# Patient Record
Sex: Male | Born: 1965 | Race: Black or African American | Hispanic: No | Marital: Married | Smoking: Former smoker
Health system: Southern US, Community
[De-identification: ages and names within clinical notes are randomized; demographics above are authoritative.]

## PROBLEM LIST (undated history)

## (undated) DIAGNOSIS — G4730 Sleep apnea, unspecified: Secondary | ICD-10-CM

## (undated) DIAGNOSIS — I509 Heart failure, unspecified: Secondary | ICD-10-CM

## (undated) DIAGNOSIS — J45909 Unspecified asthma, uncomplicated: Secondary | ICD-10-CM

## (undated) DIAGNOSIS — I10 Essential (primary) hypertension: Secondary | ICD-10-CM

## (undated) HISTORY — DX: Essential (primary) hypertension: I10

## (undated) HISTORY — DX: Unspecified asthma, uncomplicated: J45.909

## (undated) HISTORY — DX: Sleep apnea, unspecified: G47.30

## (undated) HISTORY — PX: NO PAST SURGERIES: SHX2092

---

## 2013-02-10 ENCOUNTER — Encounter (HOSPITAL_COMMUNITY): Payer: Self-pay | Admitting: Emergency Medicine

## 2013-02-10 ENCOUNTER — Emergency Department (HOSPITAL_COMMUNITY)
Admission: EM | Admit: 2013-02-10 | Discharge: 2013-02-10 | Disposition: A | Discharge status: Home / Self Care | Payer: Self-pay | Attending: Emergency Medicine | Admitting: Emergency Medicine

## 2013-02-10 DIAGNOSIS — J3489 Other specified disorders of nose and nasal sinuses: Secondary | ICD-10-CM | POA: Insufficient documentation

## 2013-02-10 DIAGNOSIS — I10 Essential (primary) hypertension: Secondary | ICD-10-CM | POA: Insufficient documentation

## 2013-02-10 DIAGNOSIS — B9789 Other viral agents as the cause of diseases classified elsewhere: Secondary | ICD-10-CM

## 2013-02-10 DIAGNOSIS — J069 Acute upper respiratory infection, unspecified: Secondary | ICD-10-CM | POA: Insufficient documentation

## 2013-02-10 DIAGNOSIS — F17200 Nicotine dependence, unspecified, uncomplicated: Secondary | ICD-10-CM | POA: Insufficient documentation

## 2013-02-10 DIAGNOSIS — R062 Wheezing: Secondary | ICD-10-CM | POA: Insufficient documentation

## 2013-02-10 DIAGNOSIS — R509 Fever, unspecified: Secondary | ICD-10-CM | POA: Insufficient documentation

## 2013-02-10 DIAGNOSIS — J029 Acute pharyngitis, unspecified: Secondary | ICD-10-CM | POA: Insufficient documentation

## 2013-02-10 MED ORDER — ACETAMINOPHEN 325 MG PO TABS
650.0000 mg | ORAL_TABLET | Freq: Once | ORAL | Status: AC
  Administered 2013-02-10: 650 mg via ORAL
  Filled 2013-02-10: qty 2

## 2013-02-10 MED ORDER — PREDNISONE 20 MG PO TABS: 40.0000 mg | ORAL_TABLET | Freq: Every day | ORAL | Status: DC

## 2013-02-10 MED ORDER — ALBUTEROL SULFATE (2.5 MG/3ML) 0.083% IN NEBU
5.0000 mg | INHALATION_SOLUTION | Freq: Once | RESPIRATORY_TRACT | Status: AC
  Administered 2013-02-10: 5 mg via RESPIRATORY_TRACT
  Filled 2013-02-10: qty 6

## 2013-02-10 MED ORDER — IPRATROPIUM BROMIDE 0.02 % IN SOLN
0.5000 mg | Freq: Once | RESPIRATORY_TRACT | Status: AC
  Administered 2013-02-10: 0.5 mg via RESPIRATORY_TRACT
  Filled 2013-02-10: qty 2.5

## 2013-02-10 MED ORDER — ALBUTEROL SULFATE HFA 108 (90 BASE) MCG/ACT IN AERS: 2.0000 | INHALATION_SPRAY | Freq: Four times a day (QID) | RESPIRATORY_TRACT | Status: DC | PRN

## 2013-02-10 MED ORDER — PREDNISONE 20 MG PO TABS
60.0000 mg | ORAL_TABLET | Freq: Once | ORAL | Status: AC
  Administered 2013-02-10: 60 mg via ORAL
  Filled 2013-02-10: qty 3

## 2013-02-10 MED ORDER — HYDROCODONE-HOMATROPINE 5-1.5 MG/5ML PO SYRP: 5.0000 mL | ORAL_SOLUTION | Freq: Four times a day (QID) | ORAL | Status: DC | PRN

## 2013-02-10 NOTE — ED Notes (Signed)
Pt last saw doctor 4th of July 2014; pt stated BP was high but didn't remember what it was.

## 2013-02-10 NOTE — ED Notes (Signed)
Per pt sts that he has been having cold symptoms for a few days. sts  Congestion and not feeling well. . sts he actually feels better today than he has in a few days.

## 2013-02-10 NOTE — ED Provider Notes (Signed)
CSN: [id number]     Arrival date & time 02/10/13  1251 History  This chart was scribed for non-physician practitioner Abigail Butts, PA-C working with Richarda Blade, MD by Rolanda Lundborg, ED Scribe. This patient was seen in room TR11C/TR11C and the patient's care was started at 3:07 PM.    Chief Complaint  Patient presents with  . URI   The history is provided by the patient and medical records. No language interpreter was used.   HPI Comments: Keir Viernes is a 48 y.o. male with a h/o asthma who presents to the Emergency Department complaining of waxing and waning URI symptoms including congestion, rhinorrhea, headache, sore throat, fever, chills onset 4 days ago. Tmax 101 yesterday, broke with tylenol cold and ibuprofen. He reports productive cough with clear yellow sputum. He reports mild left ear pain that has mostly resolved. He states he started feeling better this morning but the symptoms have worsened again today. He states he has an albuterol inhaler but he does not use it. He denies myalgias, CP. He smokes 1/2 PPD.  History reviewed. No pertinent past medical history. History reviewed. No pertinent past surgical history. History reviewed. No pertinent family history. History  Substance Use Topics  . Smoking status: Current Every Day Smoker  . Smokeless tobacco: Not on file  . Alcohol Use: No    Review of Systems  Constitutional: Positive for fever and chills. Negative for diaphoresis, appetite change, fatigue and unexpected weight change.  HENT: Positive for congestion, ear pain, rhinorrhea and sore throat. Negative for mouth sores.   Eyes: Negative for visual disturbance.  Respiratory: Positive for cough. Negative for chest tightness, shortness of breath and wheezing.   Cardiovascular: Negative for chest pain.  Gastrointestinal: Negative for nausea, vomiting, abdominal pain, diarrhea and constipation.  Endocrine: Negative for polydipsia, polyphagia and polyuria.   Genitourinary: Negative for dysuria, urgency, frequency and hematuria.  Musculoskeletal: Negative for back pain, myalgias and neck stiffness.  Skin: Negative for rash.  Allergic/Immunologic: Negative for immunocompromised state.  Neurological: Negative for syncope, light-headedness and headaches.  Hematological: Does not bruise/bleed easily.  Psychiatric/Behavioral: Negative for sleep disturbance. The patient is not nervous/anxious.   All other systems reviewed and are negative.    Allergies  Review of patient's allergies indicates no known allergies.  Home Medications  No current outpatient prescriptions on file. BP 134/111  Pulse 95  Temp(Src) 98.7 F (37.1 C) (Oral)  Resp 24  Ht 5\' 6"  (1.676 m)  Wt 230 lb (104.327 kg)  BMI 37.14 kg/m2  SpO2 93% Physical Exam  Constitutional: He is oriented to person, place, and time. He appears well-developed and well-nourished. No distress.  HENT:  Head: Normocephalic and atraumatic.  Right Ear: Tympanic membrane, external ear and ear canal normal. Tympanic membrane is not erythematous and not bulging.  Left Ear: Tympanic membrane, external ear and ear canal normal. Tympanic membrane is not erythematous and not bulging.  Nose: Mucosal edema and rhinorrhea present. No epistaxis. Right sinus exhibits no maxillary sinus tenderness and no frontal sinus tenderness. Left sinus exhibits no maxillary sinus tenderness and no frontal sinus tenderness.  Mouth/Throat: Uvula is midline and mucous membranes are normal. Mucous membranes are not pale and not cyanotic. Posterior oropharyngeal erythema (mild) present. No oropharyngeal exudate, posterior oropharyngeal edema or tonsillar abscesses.  Eyes: Conjunctivae are normal. Pupils are equal, round, and reactive to light.  Neck: Normal range of motion and full passive range of motion without pain.  Cardiovascular: Normal rate, normal heart sounds  and intact distal pulses.   No murmur  heard. Pulmonary/Chest: Effort normal. No accessory muscle usage or stridor. Not tachypneic. No respiratory distress. He has decreased breath sounds (lower lobes). He has wheezes (expiratory throughout). He has no rhonchi. He has no rales.  Diminished lung sounds in the bases with expiratory wheezes throughout  Abdominal: Soft. Bowel sounds are normal. He exhibits no distension. There is no tenderness.  Musculoskeletal: Normal range of motion.  Lymphadenopathy:    He has no cervical adenopathy.  Neurological: He is alert and oriented to person, place, and time. He exhibits normal muscle tone. Coordination normal.  Skin: Skin is warm and dry. No rash noted. He is not diaphoretic. No erythema.  Psychiatric: He has a normal mood and affect.    ED Course  Procedures (including critical care time) Medications - No data to display   DIAGNOSTIC STUDIES: Oxygen Saturation is 93% on RA, normal by my interpretation.    COORDINATION OF CARE: 3:34 PM- Discussed treatment plan with pt which includes breathing treatment and steroids. Pt agrees to plan.    Labs Review Labs Reviewed - No data to display Imaging Review No results found.  EKG Interpretation   None       MDM  No diagnosis found.   [name] presents with URI ssx x 4 days.  Patient with no focal lung abnormalities, only Gen. wheezing and history of asthma. No x-ray indicated at this time.  Patients symptoms are consistent with URI, likely viral etiology. Discussed that antibiotics are not indicated for viral infections. Patient with wheezing throughout and has not attempted albuterol treatments at home. We'll give albuterol treatment here and reassess.  5:45PM Patient reports he is breathing much better but still feels poorly.  He is febrile 100.8. Will give Tylenol and reassess.  6:32 PM Patient noted to be hypertensive in the emergency department, and remains that way at this time..  No signs of hypertensive  urgency.  Discussed with patient the need for close follow-up and management by their primary care physician.  He reports hypertension at his last in the evaluation in July of 2014, but denies having a diagnosis of hypertension. Patient has been taking cold medicine from a car for several days exacerbating his hypertension.  He denies chest pain, shortness of breath, headache or any other symptoms decreased concern for endorgan damage   It has been determined that no acute conditions requiring further emergency intervention are present at this time. The patient/guardian have been advised of the diagnosis and plan. We have discussed signs and symptoms that warrant return to the ED, such as changes or worsening in symptoms.   Vital signs are stable at discharge.   BP 180/104  Pulse 98  Temp(Src) 100.8 F (38.2 C) (Oral)  Resp 20  Ht 5\' 6"  (1.676 m)  Wt 230 lb (104.327 kg)  BMI 37.14 kg/m2  SpO2 98%  Patient/guardian has voiced understanding and agreed to follow-up with the PCP or specialist.    I personally performed the services described in this documentation, which was scribed in my presence. The recorded information has been reviewed and is accurate.  1. Medications: Albuterol and prednisone for asthma exacerbation, Hycodan for cough, usual home medications 2. Treatment: rest, drink plenty of fluids, take Coricidin cold medicine instead of the current cold medicine. 3. Follow Up: Please use the resource guide provided to find a PCP;   Jarrett Soho Deiontae Rabel, PA-C 02/10/13 1842

## 2013-02-10 NOTE — Discharge Instructions (Signed)
1. Medications: Albuterol and prednisone for asthma exacerbation, Hycodan for cough, usual home medications 2. Treatment: rest, drink plenty of fluids, take Coricidin cold medicine instead of the current cold medicine. 3. Follow Up: Please followup with your primary doctor for discussion of your diagnoses and further evaluation after today's visit; if you do not have a primary care doctor use the resource guide provided to find one;   Upper Respiratory Infection, Adult An upper respiratory infection (URI) is also known as the common cold. It is often caused by a type of germ (virus). Colds are easily spread (contagious). You can pass it to others by kissing, coughing, sneezing, or drinking out of the same glass. Usually, you get better in 1 or 2 weeks.  HOME CARE   Only take medicine as told by your doctor.  Use a warm mist humidifier or breathe in steam from a hot shower.  Drink enough water and fluids to keep your pee (urine) clear or pale yellow.  Get plenty of rest.  Return to work when your temperature is back to normal or as told by your doctor. You may use a face mask and wash your hands to stop your cold from spreading. GET HELP RIGHT AWAY IF:   After the first few days, you feel you are getting worse.  You have questions about your medicine.  You have chills, shortness of breath, or brown or red spit (mucus).  You have yellow or brown snot (nasal discharge) or pain in the face, especially when you bend forward.  You have a fever, puffy (swollen) neck, pain when you swallow, or white spots in the back of your throat.  You have a bad headache, ear pain, sinus pain, or chest pain.  You have a high-pitched whistling sound when you breathe in and out (wheezing).  You have a lasting cough or cough up blood.  You have sore muscles or a stiff neck. MAKE SURE YOU:   Understand these instructions.  Will watch your condition.  Will get help right away if you are not doing well  or get worse. Document Released: 07/03/2007 Document Revised: 04/08/2011 Document Reviewed: 05/21/2010 [facility] Patient Information 2014 [place], [place].   Hypertension As your heart beats, it forces blood through your arteries. This force is your blood pressure. If the pressure is too high, it is called hypertension (HTN) or high blood pressure. HTN is dangerous because you may have it and not know it. High blood pressure may mean that your heart has to work harder to pump blood. Your arteries may be narrow or stiff. The extra work puts you at risk for heart disease, stroke, and other problems.  Blood pressure consists of two numbers, a higher number over a lower, 110/72, for example. It is stated as "110 over 72." The ideal is below 120 for the top number (systolic) and under 80 for the bottom (diastolic). Write down your blood pressure today. You should pay close attention to your blood pressure if you have certain conditions such as:  Heart failure.  Prior heart attack.  Diabetes  Chronic kidney disease.  Prior stroke.  Multiple risk factors for heart disease. To see if you have HTN, your blood pressure should be measured while you are seated with your arm held at the level of the heart. It should be measured at least twice. A one-time elevated blood pressure reading (especially in the Emergency Department) does not mean that you need treatment. There may be conditions in which the  blood pressure is different between your right and left arms. It is important to see your caregiver soon for a recheck. Most people have essential hypertension which means that there is not a specific cause. This type of high blood pressure may be lowered by changing lifestyle factors such as:  Stress.  Smoking.  Lack of exercise.  Excessive weight.  Drug/tobacco/alcohol use.  Eating less salt. Most people do not have symptoms from high blood pressure until it has caused damage to the body. Effective  treatment can often prevent, delay or reduce that damage. TREATMENT  When a cause has been identified, treatment for high blood pressure is directed at the cause. There are a large number of medications to treat HTN. These fall into several categories, and your caregiver will help you select the medicines that are best for you. Medications may have side effects. You should review side effects with your caregiver. If your blood pressure stays high after you have made lifestyle changes or started on medicines,   Your medication(s) may need to be changed.  Other problems may need to be addressed.  Be certain you understand your prescriptions, and know how and when to take your medicine.  Be sure to follow up with your caregiver within the time frame advised (usually within two weeks) to have your blood pressure rechecked and to review your medications.  If you are taking more than one medicine to lower your blood pressure, make sure you know how and at what times they should be taken. Taking two medicines at the same time can result in blood pressure that is too low. SEEK IMMEDIATE MEDICAL CARE IF:  You develop a severe headache, blurred or changing vision, or confusion.  You have unusual weakness or numbness, or a faint feeling.  You have severe chest or abdominal pain, vomiting, or breathing problems. MAKE SURE YOU:   Understand these instructions.  Will watch your condition.  Will get help right away if you are not doing well or get worse. Document Released: 01/14/2005 Document Revised: 04/08/2011 Document Reviewed: 09/04/2007 [facility] Patient Information 2014 [place].

## 2013-02-11 NOTE — ED Provider Notes (Signed)
Medical screening examination/treatment/procedure(s) were performed by non-physician practitioner and as supervising physician I was immediately available for consultation/collaboration.  Richarda Blade, MD 02/11/13 [phone]

## 2015-08-14 ENCOUNTER — Encounter (HOSPITAL_COMMUNITY): Payer: Self-pay

## 2015-08-14 ENCOUNTER — Emergency Department (HOSPITAL_COMMUNITY): Payer: 59

## 2015-08-14 ENCOUNTER — Emergency Department (HOSPITAL_COMMUNITY)
Admission: EM | Admit: 2015-08-14 | Discharge: 2015-08-14 | Disposition: A | Discharge status: Home / Self Care | Payer: 59 | Attending: Emergency Medicine | Admitting: Emergency Medicine

## 2015-08-14 DIAGNOSIS — F17200 Nicotine dependence, unspecified, uncomplicated: Secondary | ICD-10-CM | POA: Insufficient documentation

## 2015-08-14 DIAGNOSIS — D1720 Benign lipomatous neoplasm of skin and subcutaneous tissue of unspecified limb: Secondary | ICD-10-CM

## 2015-08-14 DIAGNOSIS — Z791 Long term (current) use of non-steroidal anti-inflammatories (NSAID): Secondary | ICD-10-CM | POA: Diagnosis not present

## 2015-08-14 DIAGNOSIS — Z79899 Other long term (current) drug therapy: Secondary | ICD-10-CM | POA: Insufficient documentation

## 2015-08-14 DIAGNOSIS — I10 Essential (primary) hypertension: Secondary | ICD-10-CM | POA: Diagnosis not present

## 2015-08-14 DIAGNOSIS — D1722 Benign lipomatous neoplasm of skin and subcutaneous tissue of left arm: Secondary | ICD-10-CM | POA: Diagnosis not present

## 2015-08-14 DIAGNOSIS — M799 Soft tissue disorder, unspecified: Secondary | ICD-10-CM | POA: Diagnosis present

## 2015-08-14 LAB — CBC
HEMATOCRIT: 41.3 % (ref 39.0–52.0)
HEMOGLOBIN: 14.4 g/dL (ref 13.0–17.0)
MCH: 29.6 pg (ref 26.0–34.0)
MCHC: 34.9 g/dL (ref 30.0–36.0)
MCV: 85 fL (ref 78.0–100.0)
Platelets: 208 10*3/uL (ref 150–400)
RBC: 4.86 MIL/uL (ref 4.22–5.81)
RDW: 12.9 % (ref 11.5–15.5)
WBC: 5.6 10*3/uL (ref 4.0–10.5)

## 2015-08-14 LAB — BASIC METABOLIC PANEL
ANION GAP: 6 (ref 5–15)
BUN: 14 mg/dL (ref 6–20)
CO2: 29 mmol/L (ref 22–32)
Calcium: 8.7 mg/dL — ABNORMAL LOW (ref 8.9–10.3)
Chloride: 104 mmol/L (ref 101–111)
Creatinine, Ser: 0.95 mg/dL (ref 0.61–1.24)
GFR calc Af Amer: >60 mL/min (ref >60)
GFR calc non Af Amer: >60 mL/min (ref >60)
GLUCOSE: 135 mg/dL — AB (ref 65–99)
POTASSIUM: 3.5 mmol/L (ref 3.5–5.1)
Sodium: 139 mmol/L (ref 135–145)

## 2015-08-14 LAB — I-STAT TROPONIN, ED: Troponin i, poc: 0.01 ng/mL (ref 0.00–0.08)

## 2015-08-14 IMAGING — CR DG CHEST 2V
2 series · 2 of 2 positions shown · non-contrast
Comparison: None.

CLINICAL DATA: Right side chest pain starting yesterday

EXAM:
CHEST  2 VIEW

[w chest pa]
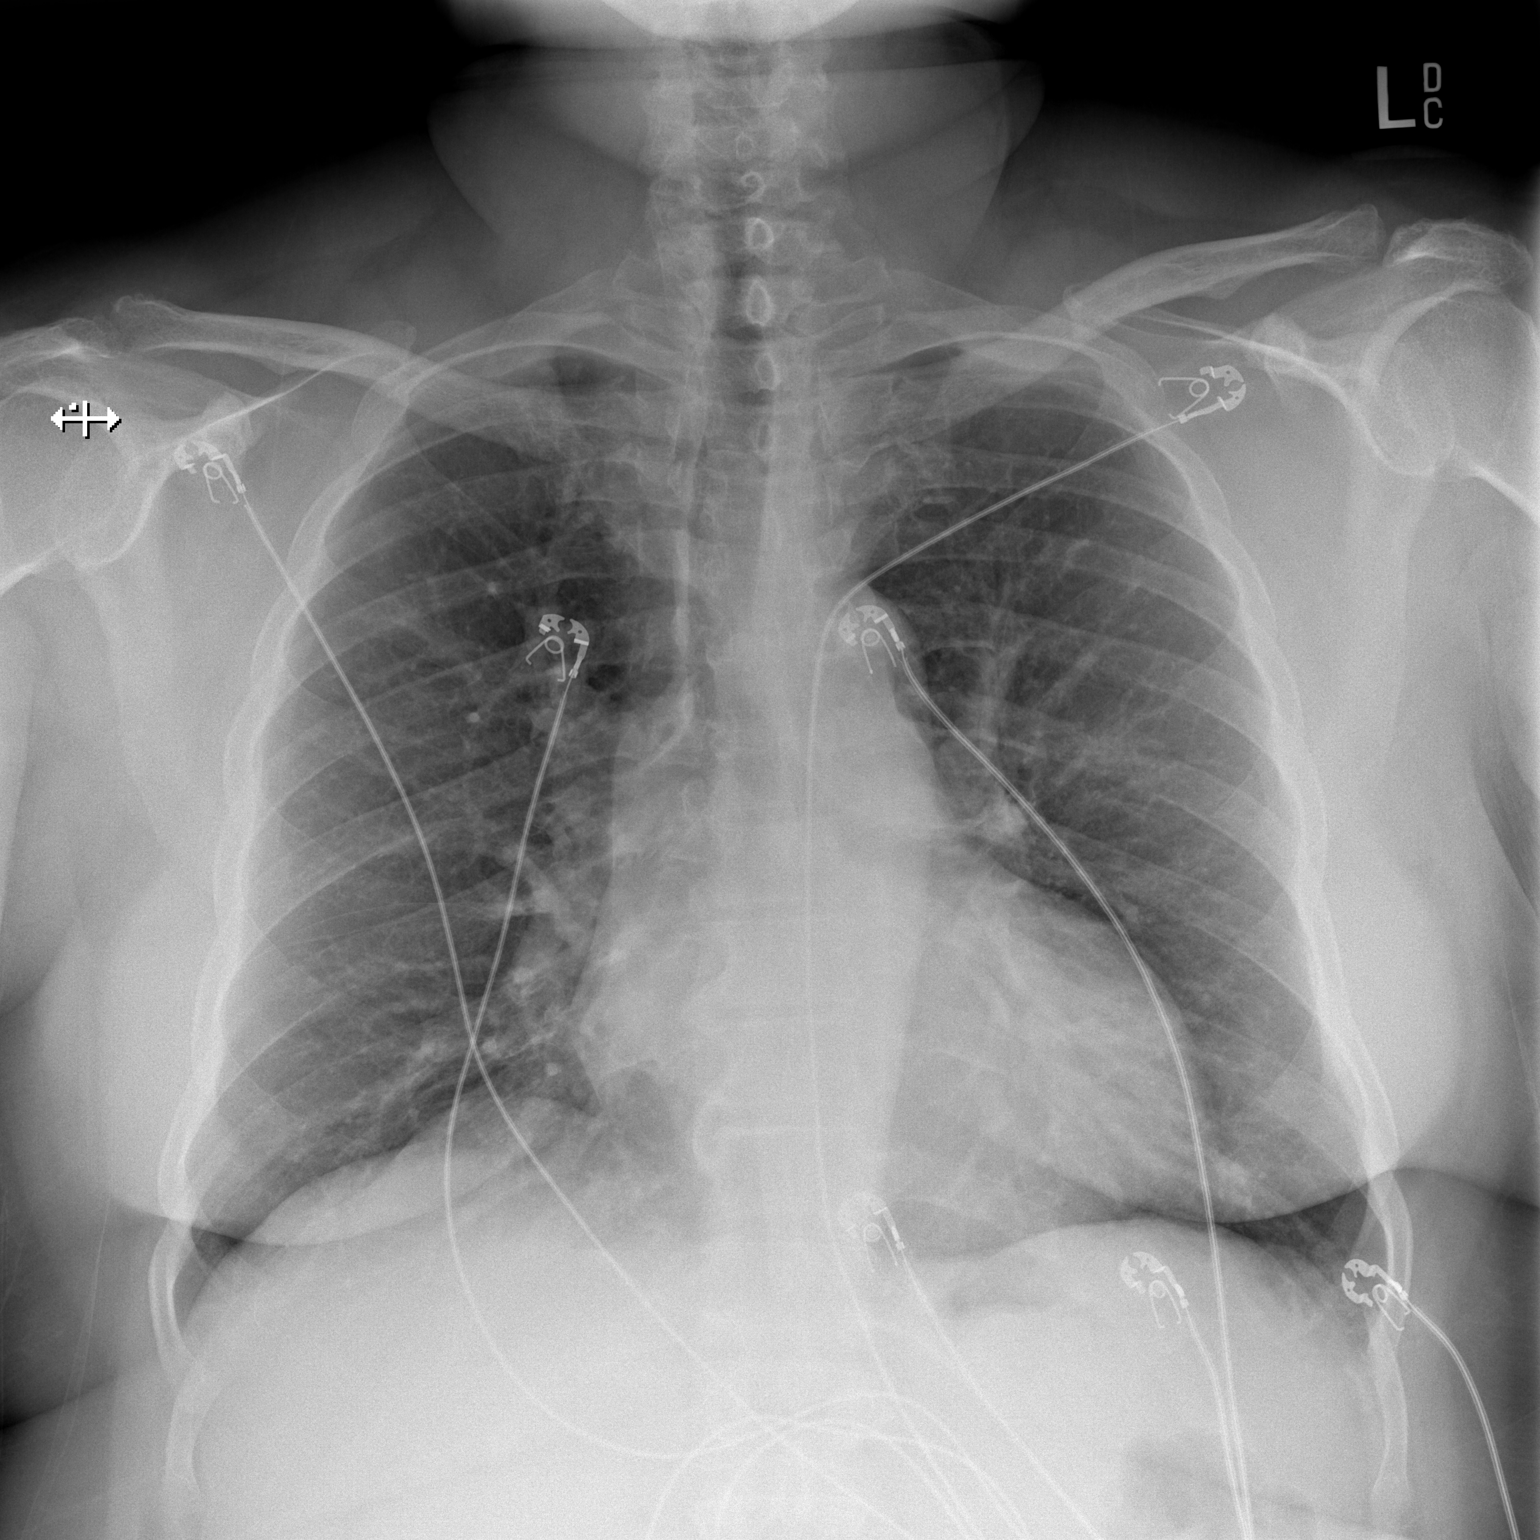

[w chest lat]
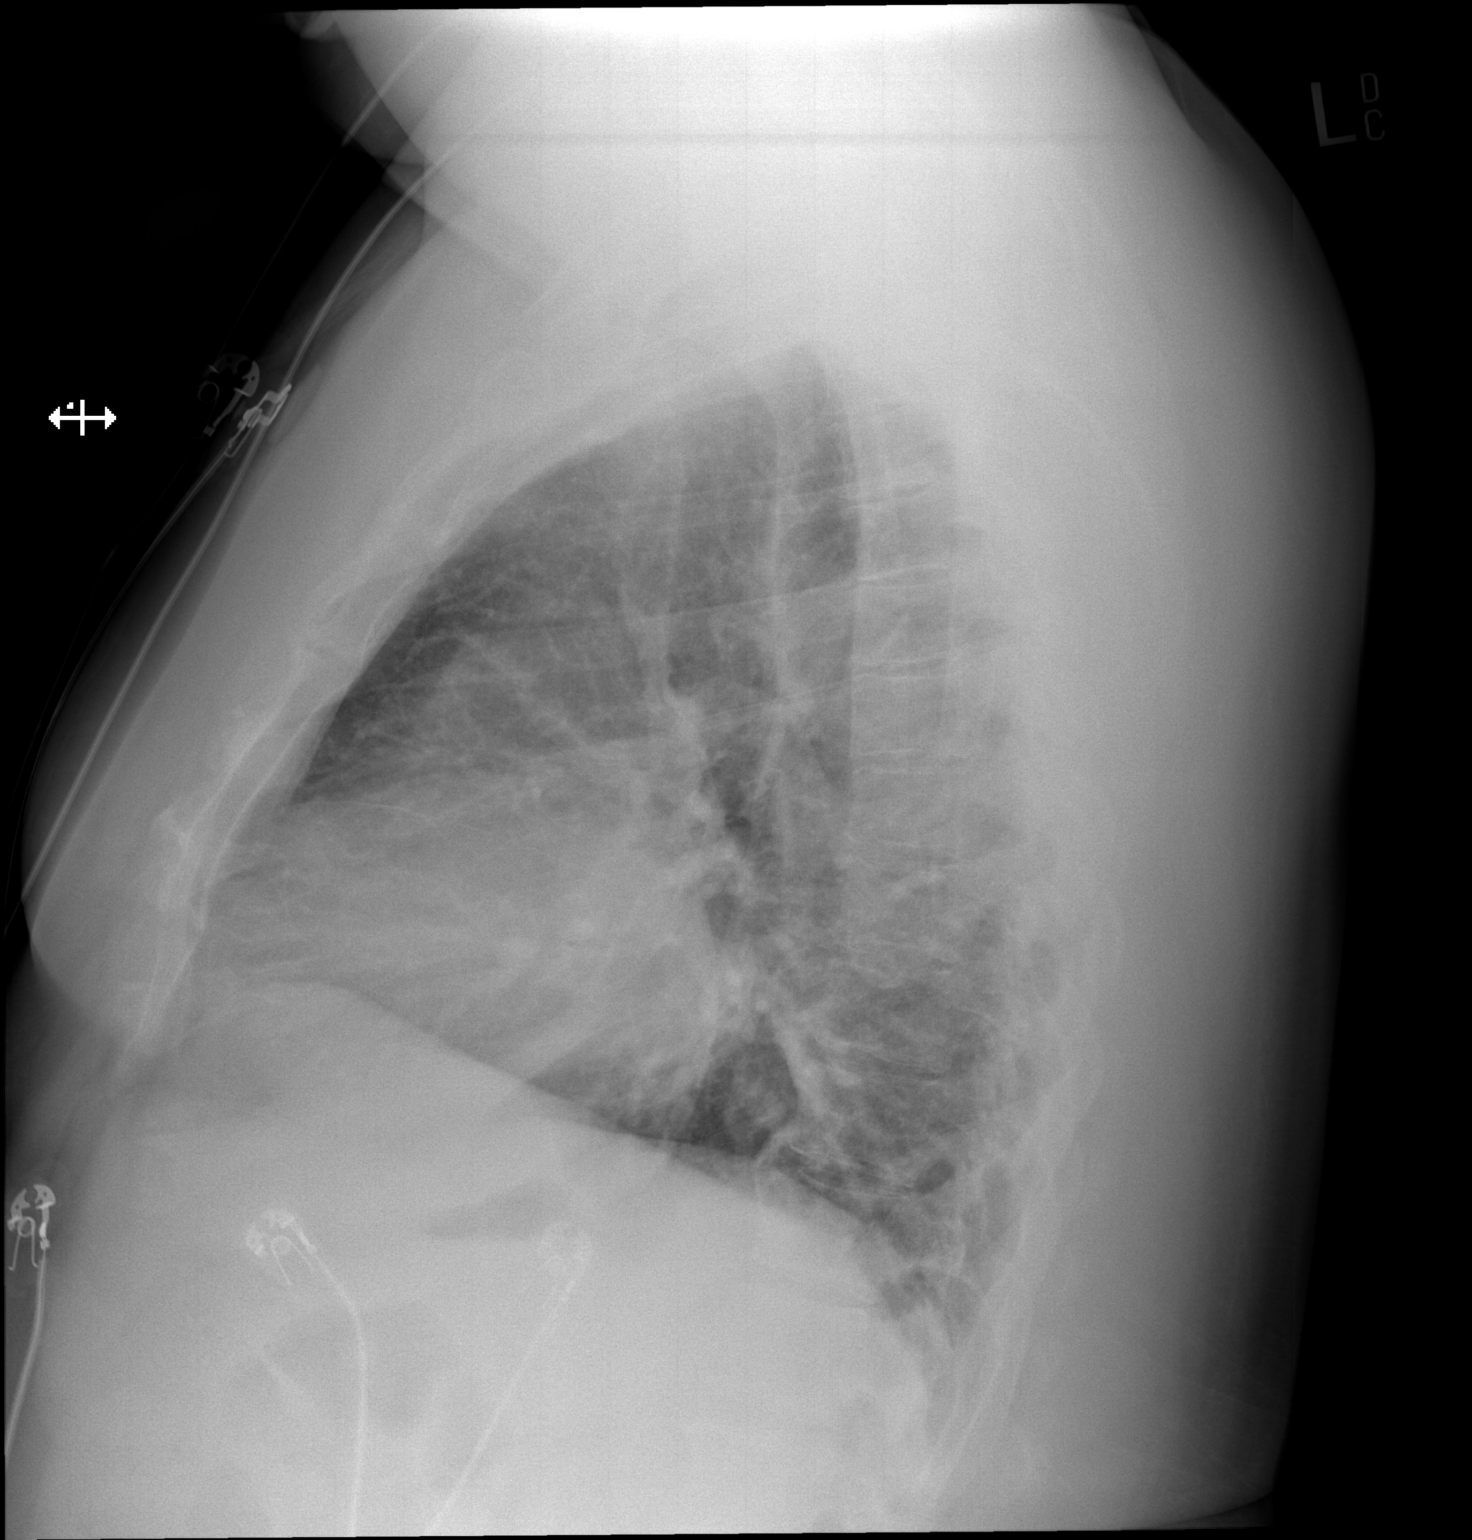

[2 of 2 positions shown; findings below may reference images not displayed]

FINDINGS: Cardiomediastinal silhouette is unremarkable. No acute infiltrate or
pleural effusion. No pulmonary edema. Mild degenerative changes
lower thoracic spine. Mild perihilar bronchitic changes.
IMPRESSION: No infiltrate or pulmonary edema. Mild perihilar bronchitic changes.

## 2015-08-14 NOTE — ED Provider Notes (Addendum)
CSN: [id number]     Arrival date & time 08/14/15  1358 History   First MD Initiated Contact with Patient 08/14/15 1428     Chief Complaint  Patient presents with  . Arm Swelling  . Chest Pain     (Consider location/radiation/quality/duration/timing/severity/associated sxs/prior Treatment) HPI Comments: Patient is a 50 year old male with no known medical problems however he does not go to the doctor and was last seen by a physician approximate 5 years ago. Today patient has 2 separate complaints. Initial complaint of swelling on his left arm that's worsened over the last 2 months. It is nonpainful but is now starting to cause some tingling and numbness in his left thumb. 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 He denies having any trauma to this area of his arm and states the swelling just seems to be getting bigger. It has never drained or become red. Secondly patient states for months he's had intermittent episodes of sharp chest pain the last 1-2 seconds that are in the center of his chest. They usually occur at rest and are not associated with coughing or deep breathing. He has also noticed intermittent lower extremity swelling and shortness of breath that comes and goes and usually last no more than 1-2 days. He currently is not having any of these symptoms and has not had them for days. He does not watch his diet he does smoke cigarettes only but does not drink much alcohol. He has noticed when he eats a salty meal he seems to have more swelling and more shortness of breath.  The history is provided by the patient.    History reviewed. No pertinent past medical history. History reviewed. No pertinent past surgical history. History reviewed. No pertinent family history. Social History  Substance Use Topics  . Smoking status: Current Every Day Smoker  . Smokeless tobacco: None  . Alcohol Use: No    Review of Systems  All other systems reviewed and are  negative.     Allergies  Review of patient's allergies indicates no known allergies.  Home Medications   Prior to Admission medications   Medication Sig Start Date End Date Taking? Authorizing Provider  albuterol (PROVENTIL HFA;VENTOLIN HFA) 108 (90 BASE) MCG/ACT inhaler Inhale 2 puffs into the lungs every 6 (six) hours as needed for wheezing or shortness of breath. 02/10/13   Hannah Muthersbaugh, PA-C  HYDROcodone-homatropine (HYCODAN) 5-1.5 MG/5ML syrup Take 5 mLs by mouth every 6 (six) hours as needed for cough. 02/10/13   Hannah Muthersbaugh, PA-C  ibuprofen (ADVIL,MOTRIN) 200 MG tablet Take 400 mg by mouth every 6 (six) hours as needed.    Historical Provider, MD  predniSONE (DELTASONE) 20 MG tablet Take 2 tablets (40 mg total) by mouth daily. 02/10/13   Hannah Muthersbaugh, PA-C  Pseudoeph-CPM-DM-APAP (TYLENOL COLD PO) Take 2 capsules by mouth every 6 (six) hours as needed. Cold.    Historical Provider, MD   BP 160/89 mmHg  Pulse 85  Temp(Src) 99.5 F (37.5 C) (Oral)  Resp 18  Ht 5' 6.5" (1.689 m)  Wt 273 lb (123.832 kg)  BMI 43.41 kg/m2  SpO2 94% Physical Exam  Constitutional: He is oriented to person, place, and time. He appears well-developed and well-nourished. No distress.  HENT:  Head: Normocephalic and atraumatic.  Mouth/Throat: Oropharynx is clear and moist.  Eyes: Conjunctivae and EOM are normal. Pupils are equal, round, and reactive to light.  Neck: Normal range of motion. Neck  supple.  Cardiovascular: Normal rate, regular rhythm and intact distal pulses.   No murmur heard. Pulmonary/Chest: Effort normal and breath sounds normal. No respiratory distress. He has no wheezes. He has no rales.  Abdominal: Soft. He exhibits no distension. There is no tenderness. There is no rebound and no guarding.  Musculoskeletal: Normal range of motion. He exhibits no edema or tenderness.       Arms: Neurological: He is alert and oriented to person, place, and time.  Skin: Skin is  warm and dry. No rash noted. No erythema.  Psychiatric: He has a normal mood and affect. His behavior is normal.  Nursing note and vitals reviewed.   ED Course  Procedures (including critical care time) Labs Review Labs Reviewed  BASIC METABOLIC PANEL - Abnormal; Notable for the following:    Glucose, Bld 135 (*)    Calcium 8.7 (*)    All other components within normal limits  CBC  I-STAT TROPOININ, ED    Imaging Review Dg Chest 2 View  08/14/2015  CLINICAL DATA:  Right side chest pain starting yesterday EXAM: CHEST  2 VIEW COMPARISON:  None. FINDINGS: Cardiomediastinal silhouette is unremarkable. No acute infiltrate or pleural effusion. No pulmonary edema. Mild degenerative changes lower thoracic spine. Mild perihilar bronchitic changes. IMPRESSION: No infiltrate or pulmonary edema. Mild perihilar bronchitic changes. Electronically Signed   By: Lahoma Crocker M.D.   On: 08/14/2015 14:33   I have personally reviewed and evaluated these images and lab results as part of my medical decision-making.   EKG Interpretation   Date/Time:  Monday August 14 2015 14:16:38 EDT Ventricular Rate:  79 PR Interval:    QRS Duration: 84 QT Interval:  386 QTC Calculation: 443 R Axis:   24 Text Interpretation:  Sinus rhythm Normal ECG No previous tracing  Confirmed by Maryan Rued  MD, Loree Fee ([id number]) on 08/14/2015 2:29:16 PM      MDM   Final diagnoses:  Lipoma of forearm  Essential hypertension    Patient is a 50 year old male presenting today with 2 complaints. Initially the biggest problem is he does not have a primary care physician. His initial reason for patient was swelling of his left arm that's worsening and now causing some numbness to his left thumb. Patient has a lipoma present on his left arm without any signs of infection. Soft but approximately baseball size. Bedside ultrasounds with no sign of cystic fluid and patient denies any trauma. Patient will need follow-up with hand surgery for  further management. Secondly patient states for months he's had intermittent sharp chest pain that last 1-2 seconds, intermittent leg swelling and occasional shortness of breath. His leg Swelling and shortness of breath seemed to be worse if he eats a salty meal. He does not check his blood pressure regularly in the last time he saw Dr. was approximately 5 years ago. He currently denies having any chest pain or shortness of breath in the last few days. She denies having any swelling right now. However since he was here he thought he should mention it.  Patient was hypertensive here but is in no acute distress and is very pleasant gentleman. He has no significant exam findings and has no obvious lower extremity edema at this time. Discussed with him the importance of getting a primary care physician as well as decreasing the salt in his diet and exercise. CBC, BMP pending to ensure normal renal function.  4:15 PM Labs within normal limits. Findings discussed with the patient and encouraged  him to follow-up with the PCP. He does have medical insurance. Also given the DASH diet and told to exercise. Also told to check his blood pressure 2 times a week and keep a record.  Blanchie Dessert, MD 08/14/15 1615  Blanchie Dessert, MD 08/14/15 [phone]

## 2015-08-14 NOTE — Progress Notes (Signed)
[facility] consulted for pcp resources.  EDCM spoke to patient at bedside.  EDCM provided patient with list of [employer] providers within a 20 mile radius of patient's zip code to assist in finding a pcp.  Patient very thankful for services. No further EDCM needs at this time.

## 2015-08-14 NOTE — ED Notes (Signed)
EDP at bedside  

## 2015-08-14 NOTE — ED Notes (Signed)
Pt with multiple complaints.  Pt does not have primary care to see patient.  After initial assessment for arm swelling which was initial complaint, pt states he wants all assessment b/c he does not have a primary md and needs blood work.  His arm swelling.  Has off/on chest pain with headache.  Denies chest pain today.  Legs with swelling.  Eyes have been red. Wife at bedside prompting his evaluation for the symptoms.  Pt initial complaint was to be seen for arm swelling x 2 months to left forearm.  Denies injury

## 2015-08-14 NOTE — Discharge Instructions (Signed)
DASH Eating Plan °DASH stands for "Dietary Approaches to Stop Hypertension." The DASH eating plan is a healthy eating plan that has been shown to reduce high blood pressure (hypertension). Additional health benefits may include reducing the risk of type 2 diabetes mellitus, heart disease, and stroke. The DASH eating plan may also help with weight loss. °WHAT DO I NEED TO KNOW ABOUT THE DASH EATING PLAN? °For the DASH eating plan, you will follow these general guidelines: °· Choose foods with a percent daily value for sodium of less than 5% (as listed on the food label). °· Use salt-free seasonings or herbs instead of table salt or sea salt. °· Check with your health care provider or pharmacist before using salt substitutes. °· Eat lower-sodium products, often labeled as "lower sodium" or "no salt added." °· Eat fresh foods. °· Eat more vegetables, fruits, and low-fat dairy products. °· Choose whole grains. Look for the word "whole" as the first word in the ingredient list. °· Choose fish and skinless chicken or turkey more often than red meat. Limit fish, poultry, and meat to 6 oz (170 g) each day. °· Limit sweets, desserts, sugars, and sugary drinks. °· Choose heart-healthy fats. °· Limit cheese to 1 oz (28 g) per day. °· Eat more home-cooked food and less restaurant, buffet, and fast food. °· Limit fried foods. °· Cook foods using methods other than frying. °· Limit canned vegetables. If you do use them, rinse them well to decrease the sodium. °· When eating at a restaurant, ask that your food be prepared with less salt, or no salt if possible. °WHAT FOODS CAN I EAT? °Seek help from a dietitian for individual calorie needs. °Grains °Whole grain or whole wheat bread. Brown rice. Whole grain or whole wheat pasta. Quinoa, bulgur, and whole grain cereals. Low-sodium cereals. Corn or whole wheat flour tortillas. Whole grain cornbread. Whole grain crackers. Low-sodium crackers. °Vegetables °Fresh or frozen vegetables  (raw, steamed, roasted, or grilled). Low-sodium or reduced-sodium tomato and vegetable juices. Low-sodium or reduced-sodium tomato sauce and paste. Low-sodium or reduced-sodium canned vegetables.  °Fruits °All fresh, canned (in natural juice), or frozen fruits. °Meat and Other Protein Products °Ground beef (85% or leaner), grass-fed beef, or beef trimmed of fat. Skinless chicken or turkey. Ground chicken or turkey. Pork trimmed of fat. All fish and seafood. Eggs. Dried beans, peas, or lentils. Unsalted nuts and seeds. Unsalted canned beans. °Dairy °Low-fat dairy products, such as skim or 1% milk, 2% or reduced-fat cheeses, low-fat ricotta or cottage cheese, or plain low-fat yogurt. Low-sodium or reduced-sodium cheeses. °Fats and Oils °Tub margarines without trans fats. Light or reduced-fat mayonnaise and salad dressings (reduced sodium). Avocado. Safflower, olive, or canola oils. Natural peanut or almond butter. °Other °Unsalted popcorn and pretzels. °The items listed above may not be a complete list of recommended foods or beverages. Contact your dietitian for more options. °WHAT FOODS ARE NOT RECOMMENDED? °Grains °White bread. White pasta. White rice. Refined cornbread. Bagels and croissants. Crackers that contain trans fat. °Vegetables °Creamed or fried vegetables. Vegetables in a cheese sauce. Regular canned vegetables. Regular canned tomato sauce and paste. Regular tomato and vegetable juices. °Fruits °Dried fruits. Canned fruit in light or heavy syrup. Fruit juice. °Meat and Other Protein Products °Fatty cuts of meat. Ribs, chicken wings, bacon, sausage, bologna, salami, chitterlings, fatback, hot dogs, bratwurst, and packaged luncheon meats. Salted nuts and seeds. Canned beans with salt. °Dairy °Whole or 2% milk, cream, half-and-half, and cream cheese. Whole-fat or sweetened yogurt. Full-fat   cheeses or blue cheese. Nondairy creamers and whipped toppings. Processed cheese, cheese spreads, or cheese  curds. Condiments Onion and garlic salt, seasoned salt, table salt, and sea salt. Canned and packaged gravies. Worcestershire sauce. Tartar sauce. Barbecue sauce. Teriyaki sauce. Soy sauce, including reduced sodium. Steak sauce. Fish sauce. Oyster sauce. Cocktail sauce. Horseradish. Ketchup and mustard. Meat flavorings and tenderizers. Bouillon cubes. Hot sauce. Tabasco sauce. Marinades. Taco seasonings. Relishes. Fats and Oils Butter, stick margarine, lard, shortening, ghee, and bacon fat. Coconut, palm kernel, or palm oils. Regular salad dressings. Other Pickles and olives. Salted popcorn and pretzels. The items listed above may not be a complete list of foods and beverages to avoid. Contact your dietitian for more information. WHERE CAN I FIND MORE INFORMATION? National Heart, Lung, and Blood Institute: travelstabloid.com   This information is not intended to replace advice given to you by your health care provider. Make sure you discuss any questions you have with your health care provider.   Document Released: 01/03/2011 Document Revised: 02/04/2014 Document Reviewed: 11/18/2012 Elsevier Interactive Patient Education 2016 [employer].  How to Take Your Blood Pressure HOW DO I GET A BLOOD PRESSURE MACHINE?  You can buy an electronic home blood pressure machine at your local pharmacy. Insurance will sometimes cover the cost if you have a prescription.  Ask your doctor what type of machine is best for you. There are different machines for your arm and your wrist.  If you decide to buy a machine to check your blood pressure on your arm, first check the size of your arm so you can buy the right size cuff. To check the size of your arm:   Use a measuring tape that shows both inches and centimeters.   Wrap the measuring tape around the upper-middle part of your arm. You may need someone to help you measure.   Write down your arm measurement in both  inches and centimeters.   To measure your blood pressure correctly, it is important to have the right size cuff.   If your arm is up to 13 inches (up to 34 centimeters), get an adult cuff size.  If your arm is 13 to 17 inches (35 to 44 centimeters), get a large adult cuff size.    If your arm is 17 to 20 inches (45 to 52 centimeters), get an adult thigh cuff.  WHAT DO THE NUMBERS MEAN?   There are two numbers that make up your blood pressure. For example: 120/80.  The first number (120 in our example) is called the "systolic pressure." It is a measure of the pressure in your blood vessels when your heart is pumping blood.  The second number (80 in our example) is called the "diastolic pressure." It is a measure of the pressure in your blood vessels when your heart is resting between beats.  Your doctor will tell you what your blood pressure should be. WHAT SHOULD I DO BEFORE I CHECK MY BLOOD PRESSURE?   Try to rest or relax for at least 30 minutes before you check your blood pressure.  Do not smoke.  Do not have any drinks with caffeine, such as:  Soda.  Coffee.  Tea.  Check your blood pressure in a quiet room.  Sit down and stretch out your arm on a table. Keep your arm at about the level of your heart. Let your arm relax.  Make sure that your legs are not crossed. HOW DO I CHECK MY BLOOD PRESSURE?  Follow  the directions that came with your machine.  Make sure you remove any tight-fitting clothing from your arm or wrist. Wrap the cuff around your upper arm or wrist. You should be able to fit a finger between the cuff and your arm. If you cannot fit a finger between the cuff and your arm, it is too tight and should be removed and rewrapped.  Some units require you to manually pump up the arm cuff.  Automatic units inflate the cuff when you press a button.  Cuff deflation is automatic in both models.  After the cuff is inflated, the unit measures your blood  pressure and pulse. The readings are shown on a monitor. Hold still and breathe normally while the cuff is inflated.  Getting a reading takes less than a minute.  Some models store readings in a memory. Some provide a printout of readings. If your machine does not store your readings, keep a written record.  Take readings with you to your next visit with your doctor.   This information is not intended to replace advice given to you by your health care provider. Make sure you discuss any questions you have with your health care provider.   Document Released: 12/28/2007 Document Revised: 02/04/2014 Document Reviewed: 03/11/2013 Elsevier Interactive Patient Education 2016 Else[employer] Inc.  Heart Disease Prevention Heart disease is a leading cause of death. There are many things you can do to help prevent heart disease. BE PHYSICALLY ACTIVE Physical activity is good for your heart. It helps control your blood pressure, cholesterol levels, and weight. Try to be physically active every day. Ask your health care provider what activities are best for you.  BE A HEALTHY WEIGHT Extra weight can strain your heart and affect your blood pressure and cholesterol levels. Lose weight with diet and exercise if recommended by your health care provider. EAT HEART-HEALTHY FOODS Follow a healthy eating plan as recommended by your health care provider or dietitian. Heart-healthy foods include:   High-fiber foods. These include oat bran, oatmeal, and whole-grain breads and cereals.  Fruits and vegetables. Avoid:  Alcohol.  Fried foods.  Foods high in saturated fat. These include meats, butter, whole dairy products, shortening, and coconut or palm oil.  Salty foods. These include canned food, luncheon meat, salty snacks, and fast food. KEEP YOUR CHOLESTEROL LEVELS UNDER CONTROL Cholesterol is a substance that is used for many important functions. When your cholesterol levels are high, cholesterol can stick  to the insides of your blood vessels, making them narrow or clog. This can lead to chest pain (angina) and a heart attack.  Keep your cholesterol levels under control as recommended by your health care provider. Have your cholesterol checked at least once a year. Target cholesterol levels (in mg/dL) for most people are:   Total cholesterol below 200.  LDL cholesterol below 100.  HDL cholesterol above 40 in men and above 50 in women.  Triglycerides below 150. KEEP YOUR BLOOD PRESSURE UNDER CONTROL Having high blood pressure (hypertension) puts you at risk for stroke and other forms of heart disease. Keep your blood pressure under control as recommended by your health care provider. Ask your health care provider if you need treatment to lower your blood pressure. If you are 78-31 years of age, have your blood pressure checked every 3-5 years. If you are 32 years of age or older, have your blood pressure checked every year. DO NOT USE TOBACCO PRODUCTS Tobacco smoke can damage your heart and blood vessels. Do  not use any tobacco products including cigarettes, chewing tobacco, or electronic cigarettes. If you need help quitting, ask your health care provider. TAKE MEDICINES AS DIRECTED Take medicines only as directed by your health care provider. Ask your health care provider whether you should take an aspirin every day. Taking aspirin can help reduce your risk of heart disease and stroke.  FOR MORE INFORMATION  To find out more about heart disease, visit the American Heart Association's website at www.americanheart.org   This information is not intended to replace advice given to you by your health care provider. Make sure you discuss any questions you have with your health care provider.   Document Released: 08/29/2003 Document Revised: 02/04/2014 Document Reviewed: 03/10/2013 Elsevier Interactive Patient Education [employer].

## 2015-12-15 ENCOUNTER — Ambulatory Visit (INDEPENDENT_AMBULATORY_CARE_PROVIDER_SITE_OTHER): Payer: Worker's Compensation | Admitting: Physician Assistant

## 2015-12-15 ENCOUNTER — Ambulatory Visit: Payer: Self-pay

## 2015-12-15 ENCOUNTER — Ambulatory Visit (INDEPENDENT_AMBULATORY_CARE_PROVIDER_SITE_OTHER): Payer: Worker's Compensation

## 2015-12-15 VITALS — BP 164/102 | HR 101 | Temp 98.4°F | Resp 16 | Ht 66.0 in | Wt 290.0 lb

## 2015-12-15 DIAGNOSIS — M25511 Pain in right shoulder: Secondary | ICD-10-CM

## 2015-12-15 DIAGNOSIS — F17200 Nicotine dependence, unspecified, uncomplicated: Secondary | ICD-10-CM | POA: Insufficient documentation

## 2015-12-15 DIAGNOSIS — I10 Essential (primary) hypertension: Secondary | ICD-10-CM | POA: Insufficient documentation

## 2015-12-15 DIAGNOSIS — F17201 Nicotine dependence, unspecified, in remission: Secondary | ICD-10-CM | POA: Insufficient documentation

## 2015-12-15 DIAGNOSIS — Z6841 Body Mass Index (BMI) 40.0 and over, adult: Secondary | ICD-10-CM | POA: Insufficient documentation

## 2015-12-15 IMAGING — DX DG SHOULDER 2+V*R*
3 series · 3 of 3 positions shown · non-contrast
Comparison: None.

CLINICAL DATA: Right shoulder pain after lifting at work.

EXAM:
RIGHT SHOULDER - 2+ VIEW

[shoulder ap]
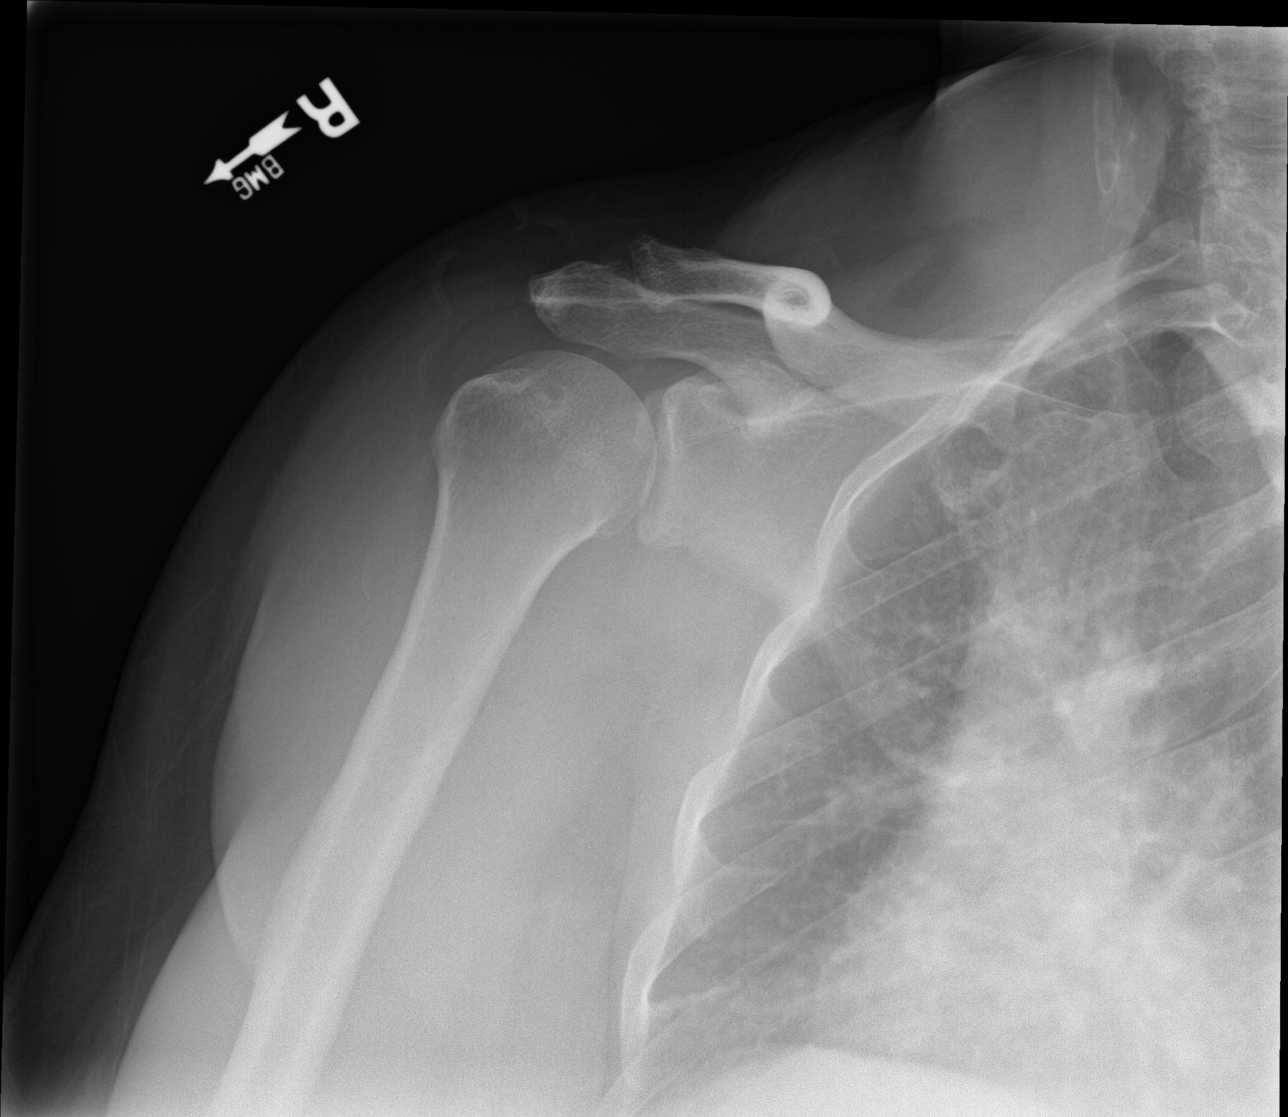

[shoulder y-view]
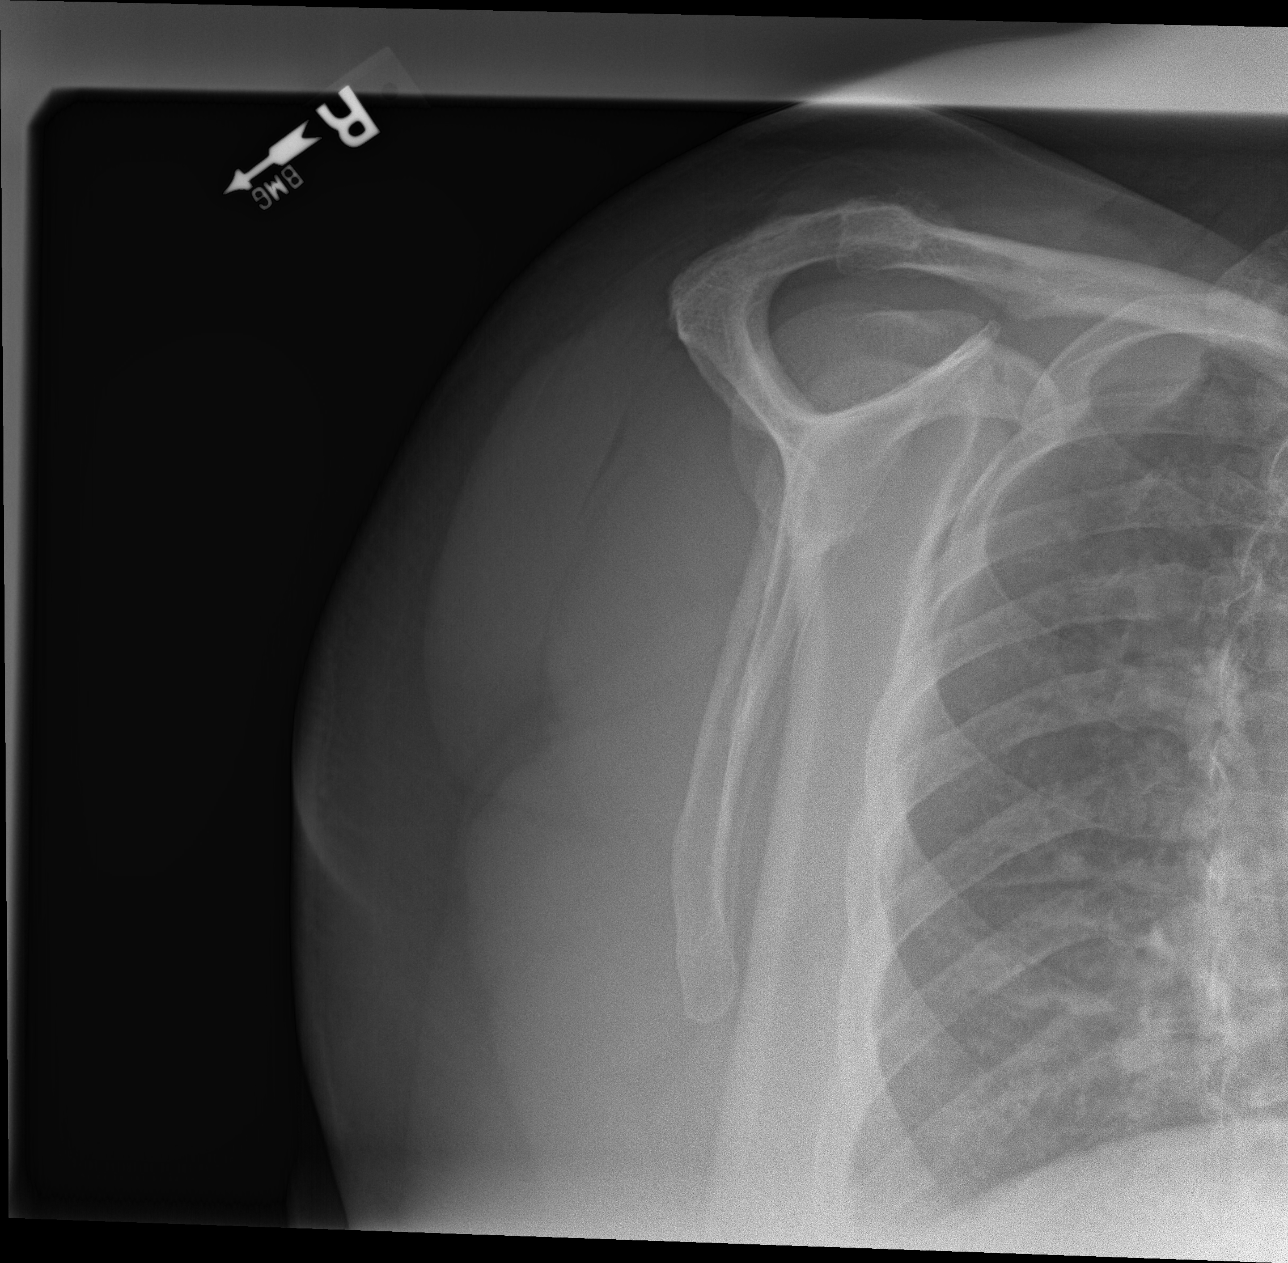

[shoulder axial]
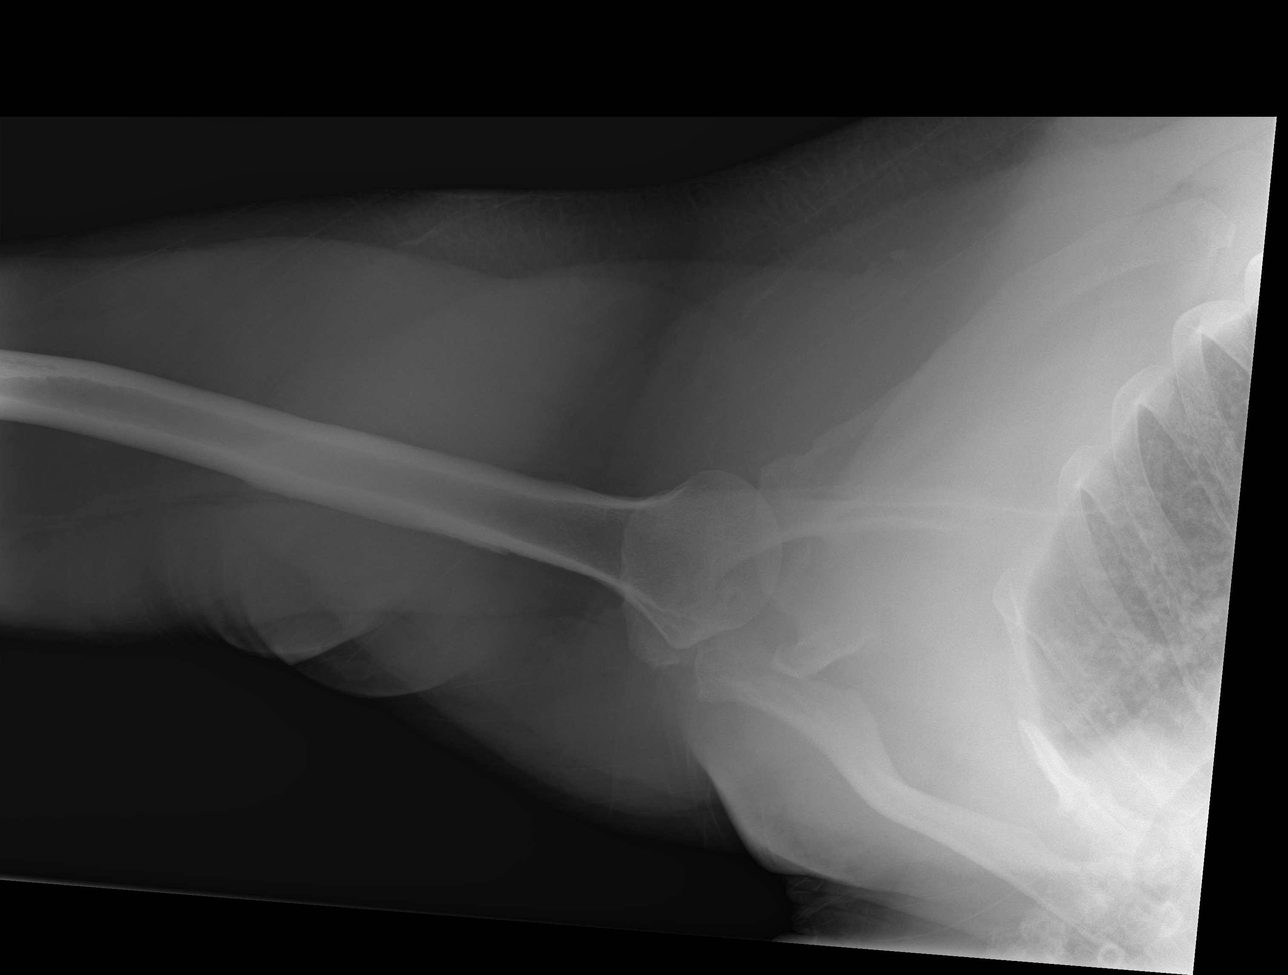

[3 of 3 positions shown; findings below may reference images not displayed]

FINDINGS: There is no evidence of fracture or dislocation. There is no
evidence of arthropathy or other focal bone abnormality. Soft
tissues are unremarkable.
IMPRESSION: Normal right shoulder.

## 2015-12-15 MED ORDER — HYDROCODONE-ACETAMINOPHEN 5-325 MG PO TABS: 1.0000 | ORAL_TABLET | Freq: Four times a day (QID) | ORAL | 0 refills | Status: DC | PRN

## 2015-12-15 MED ORDER — MELOXICAM 15 MG PO TABS: 15.0000 mg | ORAL_TABLET | Freq: Every day | ORAL | 0 refills | Status: DC

## 2015-12-15 NOTE — Progress Notes (Signed)
Randy Morgan 1965-10-09 50 y.o.   Chief Complaint  Patient presents with  . Shoulder Injury    RIGHT, X 1 day , workers [employer] for evaluation of work-related complaint.  Date of Injury: 12/14/2015  History of Present Illness: At work yesterday, putting together some molds, reached to lift a metal part and felt a very sharp pain in the RIGHT shoulder. Felt as though the shoulder popped out of joint. Has never experienced a dislocated shoulder. Wasn't able to raise the arm, unable to supinate the forearm. Was not able to continue work.  LEFT hand dominant.  Took ibuprofen (approximately 8 of the 200 mg tablets over night. Soaked in a warm bath with rubbing alcohol in the water, which seemed to help some initially. Later on, it felt tight. Overall, thinks he's a little bit better.  Reports numbness and tingling in the elbow and fingertips. Almost like he hit the funny bone, that never stops. No weakness. Able to grasp without pain, but reaching out and grasping exacerbates the pain. No neck pain.  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 Previously seen by orthopedics on a different WC case. He uses his RIGHT hand more as a result.     Review of Systems  Constitutional: Negative.   Respiratory: Negative for cough, shortness of breath and wheezing.   Cardiovascular: Negative for chest pain, palpitations and leg swelling.  Gastrointestinal: Negative for abdominal pain, nausea and vomiting.  Genitourinary: Negative for dysuria, frequency and urgency.  Musculoskeletal: Positive for joint pain and myalgias. Negative for back pain, falls and neck pain.  Skin: Negative.   Neurological: Positive for tingling (RIGHT hand) and focal weakness (LEFT thumb). Negative for tremors.       Current medications and allergies reviewed and updated. Past medical history, family history, social history have been reviewed and  updated.   Physical Exam  Constitutional: He is oriented to person, place, and time and well-developed, well-nourished, and in no distress.  BP (!) 164/102 (BP Location: Left Arm, Cuff Size: Large)   Pulse (!) 101   Temp 98.4 F (36.9 C) (Oral)   Resp 16   Ht 5\' 6"  (1.676 m)   Wt 290 lb (131.5 kg)   SpO2 97%   BMI 46.81 kg/m    Eyes: Conjunctivae are normal.  Pulmonary/Chest: Effort normal.  Musculoskeletal:       Right shoulder: He exhibits decreased range of motion, tenderness (AC joint) and pain. He exhibits no bony tenderness, no swelling, no effusion, no crepitus, no deformity, no laceration, no spasm, normal pulse and normal strength.       Left shoulder: Normal.       Right elbow: He exhibits normal range of motion, no swelling, no effusion, no deformity and no laceration. Tenderness (in the groove between the olecranon and lateral epicondyle) found. No radial head, no medial epicondyle, no lateral epicondyle and no olecranon process tenderness noted.       Left elbow: Normal.       Right wrist: Normal.       Left wrist: Normal.       Cervical back: Normal.       Right upper arm: Normal.       Left upper arm: Normal.       Right forearm: Normal.       Left forearm: He exhibits deformity (mass of the forearm, just distal to the elbow).  Right hand: Normal. Normal sensation noted. Normal strength noted.       Left hand: He exhibits decreased range of motion (NO AROM of the LEFT thumb). He exhibits no tenderness, no bony tenderness, normal capillary refill, no deformity, no laceration and no swelling. Normal sensation noted. Decreased strength noted. He exhibits thumb/finger opposition (no active function of the LEFT thumb).  Neurological: He is alert and oriented to person, place, and time. Gait normal.  Skin: Skin is warm and dry.  Psychiatric: Mood, memory, affect and judgment normal.    Dg Shoulder Right  Result Date: 12/15/2015 CLINICAL DATA:  Right shoulder pain  after lifting at work. EXAM: RIGHT SHOULDER - 2+ VIEW COMPARISON:  None. FINDINGS: There is no evidence of fracture or dislocation. There is no evidence of arthropathy or other focal bone abnormality. Soft tissues are unremarkable. IMPRESSION: Normal right shoulder. Electronically Signed   By: Marijo Conception, M.D.   On: 12/15/2015 14:57     Assessment and Plan: 1. Acute pain of right shoulder Impingement on exam. Likely strain, and anticipate considerable improvement with rest and anti-inflammatory. Hydrocodone for severe pain. Re-evaluate on Monday 11/20. If not significantly improved, will refer to orthopedics for additional evaluation. - DG Shoulder Right; Future - meloxicam (MOBIC) 15 MG tablet; Take 1 tablet (15 mg total) by mouth daily.  Dispense: 30 tablet; Refill: 0 - HYDROcodone-acetaminophen (NORCO) 5-325 MG tablet; Take 1 tablet by mouth every 6 (six) hours as needed.  Dispense: 20 tablet; Refill: 0  Blood pressure is elevated, at least in part due to pain. Reassess at follow-up. He needs a PCP to address BP and the issue with the LEFT arm and thumb, and to close any care gaps.  Fara Chute, PA-C Physician Assistant-Certified [facility]

## 2015-12-19 ENCOUNTER — Other Ambulatory Visit: Payer: Self-pay

## 2015-12-19 ENCOUNTER — Ambulatory Visit (INDEPENDENT_AMBULATORY_CARE_PROVIDER_SITE_OTHER): Payer: Worker's Compensation | Admitting: Physician Assistant

## 2015-12-19 ENCOUNTER — Encounter: Payer: Self-pay | Admitting: Physician Assistant

## 2015-12-19 VITALS — BP 142/90 | HR 96 | Temp 98.6°F | Resp 16 | Wt 195.2 lb

## 2015-12-19 DIAGNOSIS — R29898 Other symptoms and signs involving the musculoskeletal system: Secondary | ICD-10-CM | POA: Diagnosis not present

## 2015-12-19 DIAGNOSIS — M25511 Pain in right shoulder: Secondary | ICD-10-CM

## 2015-12-19 DIAGNOSIS — M79601 Pain in right arm: Secondary | ICD-10-CM | POA: Diagnosis not present

## 2015-12-19 NOTE — Patient Instructions (Signed)
     IF you received an x-ray today, you will receive an invoice from [facility]. Please contact  Radiology at [phone] with questions or concerns regarding your invoice.   IF you received labwork today, you will receive an invoice from [facility]. Please contact [facility] at [phone] with questions or concerns regarding your invoice.   Our billing staff will not be able to assist you with questions regarding bills from these companies.  You will be contacted with the lab results as soon as they are available. The fastest way to get your results is to activate your My Chart account. Instructions are located on the last page of this paperwork. If you have not heard from us regarding the results in 2 weeks, please contact this office.      

## 2015-12-19 NOTE — Progress Notes (Signed)
Randy Morgan 08/01/1965 50 y.o.   Chief Complaint  Patient presents with  . Follow-up    w/c shoulder    Presents for evaluation of work-related complaint.  Date of Injury: 12/14/2015  History of Present Illness: At work 11/16, putting together some molds, reached to lift a metal part and felt a very sharp pain in the RIGHT shoulder. Felt as though the shoulder popped out of joint. Had never experienced a dislocated shoulder. Wasn't able to raise the arm, unable to supinate the forearm. Was not able to continue work.  LEFT hand dominant.  Reported numbness and tingling in the elbow and fingertips. Almost like he hit the funny bone, that never stops. No weakness. Able to grasp without pain, but reaching out and grasping exacerbates the pain. No neck pain.  Took ibuprofen (approximately 8 of the 200 mg tablets over night). Soaked in a warm bath with rubbing alcohol in the water, which seemed to help some initially. Later on, it felt tight.  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 Previously seen by orthopedics on a different WC case. He uses his RIGHT hand more as a result.  Radiographs revealed no bony deformity and he was prescribed meloxicam and PRN hydrocodone.  Today, the sharp pain has moved out of the shoulder and into the biceps.  Also less severe.  Has regained ROM of the shoulder. Pain with sweeping and lifting boxes, but significantly improved..     ROS As above.    Current medications and allergies reviewed and updated. Past medical history, family history, social history have been reviewed and updated.   Physical Exam  Constitutional: He is oriented to person, place, and time and well-developed, well-nourished, and in no distress.  BP (!) 142/90 (BP Location: Left Arm, Cuff Size: Large)   Pulse 96   Temp 98.6 F (37 C) (Oral)   Resp 16   Wt 195 lb 3.2 oz (88.5 kg)   SpO2 96%   BMI 31.51 kg/m    Eyes:  Conjunctivae are normal.  Pulmonary/Chest: Effort normal.  Musculoskeletal:       Right shoulder: Normal.       Right elbow: Normal.      Right wrist: Normal.       Right upper arm: He exhibits tenderness and swelling. He exhibits no bony tenderness, no edema, no deformity and no laceration.       Right forearm: Normal.       Arms: Neurological: He is alert and oriented to person, place, and time. Gait normal.  Skin: Skin is warm and dry.  Psychiatric: Mood, memory, affect and judgment normal.     Assessment and Plan: 1. Acute pain of right shoulder 2. Right arm pain Shoulder pain resolved, though the pain has moved into the biceps. Likely strained, though cannot exclude tear, given the swelling. Since he's improving, we will continue the current regimen, light duty, and re-evaluate in 1 week.   Fara Chute, PA-C Physician Assistant-Certified [facility]

## 2015-12-26 ENCOUNTER — Ambulatory Visit (INDEPENDENT_AMBULATORY_CARE_PROVIDER_SITE_OTHER): Payer: Worker's Compensation | Admitting: Physician Assistant

## 2015-12-26 ENCOUNTER — Encounter: Payer: Self-pay | Admitting: Physician Assistant

## 2015-12-26 VITALS — BP 152/100 | HR 100 | Temp 98.8°F | Resp 16 | Ht 66.0 in | Wt 289.0 lb

## 2015-12-26 DIAGNOSIS — M25511 Pain in right shoulder: Secondary | ICD-10-CM

## 2015-12-26 DIAGNOSIS — M79601 Pain in right arm: Secondary | ICD-10-CM | POA: Diagnosis not present

## 2015-12-26 NOTE — Progress Notes (Signed)
Randy Morgan May 06, 1965 50 y.o.       Chief Complaint  Patient presents with  . Follow-up    right shoulder pain, x 3 weeks, [employer] for evaluation of work-related complaint.  Date of Injury: [id number]  History of Present Illness: Patient presents for second follow-up of injury to right shoulder which occurred while at work on [id number]. 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 States at time of injury he felt a sharp, stabbing pain in his shoulder and down his arm which has since subsided. Right after injury, was unable to lift arm at all or supinate forearm. Patient is left handed.  States the pain has been improving over the past 2 weeks. Endorses pain in the bicep with elbow flexion. Pain in shoulder with abduction and overhead flexion. Notes a continued tingling sensation down right arm with these movements. Denies decreased grip strength, numbness, or loss of motor control. Rates the pain 6-7/10 at its worst which is decreased from previously. States Ibuprofen helps some with the pain. Patient was also given Hydrocodone-Acetominophen and Meloxicam, which he states are helpful but the Hydrocodone makes him feel "woozy". States he has not used any medications the past 2 days and has been doing full duty at work and the pain is "bearable". States that pain decreases after more activity and movement. Notes pressure in elbow causes pain radiation down to elbow joint and bicep. Denies radiation of pain to neck or chest.  Of note, for strength comparison, patient has had an ongoing problem with left arm and hand. 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 Appointment scheduled with orthopedics in December for this.  ROS Pertinent ROS mentioned above in HPI.  Current medications and allergies reviewed and updated. Past medical  history, family history, social history have been reviewed and updated.   Physical Exam  Constitutional: He is oriented to person, place, and time. He appears well-developed and well-nourished.  HENT:  Head: Normocephalic and atraumatic.  Eyes: Pupils are equal, round, and reactive to light. Right eye exhibits no discharge. Left eye exhibits no discharge. No scleral icterus.  Neck: Normal range of motion. Neck supple. No tracheal deviation present. No thyromegaly present.  Cardiovascular:  Pulses:      Radial pulses are 2+ on the right side, and 2+ on the left side.  Pulmonary/Chest: Effort normal.  Musculoskeletal:       Right shoulder: He exhibits decreased range of motion, tenderness, pain and decreased strength. He exhibits no bony tenderness, no swelling, no effusion, no crepitus, no deformity, no laceration and no spasm.       Left shoulder: He exhibits normal range of motion, no tenderness, no bony tenderness, no swelling, no effusion, no crepitus, no deformity, no laceration, no pain, no spasm, normal pulse and normal strength.       Right elbow: He exhibits normal range of motion, no swelling, no effusion, no deformity and no laceration. No tenderness found.       Left elbow: He exhibits normal range of motion, no swelling, no effusion, no deformity and no laceration.       Right wrist: He exhibits normal range of motion, no tenderness, no bony tenderness, no swelling, no effusion, no crepitus, no deformity and no laceration.       Left wrist: He exhibits normal range of  motion, no tenderness, no bony tenderness, no swelling, no effusion, no crepitus, no deformity and no laceration.       Right upper arm: He exhibits tenderness. He exhibits no bony tenderness, no swelling, no edema, no deformity and no laceration.       Left upper arm: He exhibits no tenderness, no bony tenderness, no swelling, no edema, no deformity and no laceration.       Right forearm: He exhibits no tenderness,  no bony tenderness, no swelling, no edema, no deformity and no laceration.       Left forearm: He exhibits no tenderness, no bony tenderness, no swelling, no edema, no deformity and no laceration.       Arms:      Right hand: He exhibits normal range of motion, no tenderness, no bony tenderness, no deformity and no swelling. Normal sensation noted. Normal strength noted.       Left hand: He exhibits decreased range of motion. He exhibits no tenderness, no bony tenderness, no deformity and no swelling. Decreased sensation noted. Decreased sensation is present in the radial distribution. Decreased sensation is not present in the ulnar distribution and is not present in the medial redistribution. Decreased strength noted. He exhibits thumb/finger opposition. He exhibits no finger abduction and no wrist extension trouble.  Tenderness noted upon palpation of anterior right AC joint. No pain with palpation of right bicep. Pain in right bicep with elbow supination but able to be performed. Difficulty performing due to pain being elicited with dump can test, abduction, Apley scratch test, internal rotation, and overhead flexion in right shoulder. Grip strength greater in right than left (due to nerve injury from lipoma in left arm). Brachioradialis reflexes intact bilaterally.   Neurological: He is alert and oriented to person, place, and time.  Reflex Scores:      Brachioradialis reflexes are 2+ on the right side and 2+ on the left side. Skin: Skin is warm and dry.  Psychiatric: He has a normal mood and affect. His behavior is normal.    Assessment and Plan: 1. Acute pain of right shoulder Continued passive decreased range of motion and pain in right shoulder and forearm. Recommended follow-up with physical therapy to assist in stretches to help increase ROM. - Ambulatory referral to Physical Therapy  2. Right arm pain See above. - Ambulatory referral to Physical Therapy

## 2015-12-26 NOTE — Progress Notes (Signed)
Randy Morgan [birth year] 50 y.o.   Chief Complaint  Patient presents with  . Follow-up    right shoulder pain, x 3 weeks, [employer] for evaluation of work-related complaint.  Date of Injury: 12/14/2015  History of Present Illness: At work 11/16, putting together some molds, reached to lift a metal part and felt a very sharp pain in the RIGHT shoulder. Felt as though the shoulder popped out of joint. Had never experienced a dislocated shoulder. Wasn't able to raise the arm, unable to supinate the forearm. Was not able to continue work.  LEFT hand dominant.  Reported numbness and tingling in the elbow and fingertips. Almost like he hit the funny bone, that never stops. No weakness. Able to grasp without pain, but reaching out and grasping exacerbates the pain. No neck pain.  Took ibuprofen (approximately 8 of the 200 mg tablets over night). Soaked in a warm bath with rubbing alcohol in the water, which seemed to help some initially. Later on, it felt tight.  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 Previously seen by orthopedics on a different WC case. He uses his RIGHT hand more as a result.  Radiographs revealed no bony deformity and he was prescribed meloxicam and PRN hydrocodone.  At follow-up 12/19/2015 the sharp pain had moved out of the shoulder and into the biceps.  Also less severe.  Had regained ROM of the shoulder. Pain with sweeping and lifting boxes, but significantly improved.  Today he reports continued improvement, but not resolution.  Pain rated 6-7/10. He has pain in the biceps area with flexion of the elbow, pain in the shoulder with overhead flexion and ABduction, and these movements also cause tingling in the forearm.  He is doing his regular duty work, "toughing through it," and has stopped the prescribed medications, though they helped. The hydrocodone caused him to feel "woozy." Ibuprofen has  helped some.    ROS As above.    Current medications and allergies reviewed and updated. Past medical history, family history, social history have been reviewed and updated.   Physical Exam  Constitutional: He is oriented to person, place, and time and well-developed, well-nourished, and in no distress.  BP (!) 152/100 (BP Location: Left Arm, Patient Position: Sitting, Cuff Size: Large)   Pulse 100   Temp 98.8 F (37.1 C) (Oral)   Resp 16   Ht 5\' 6"  (1.676 m)   Wt 289 lb (131.1 kg)   SpO2 96%   BMI 46.65 kg/m    Eyes: Conjunctivae are normal.  Pulmonary/Chest: Effort normal.  Musculoskeletal:       Right shoulder: He exhibits tenderness (AC joint, mild) and pain (with ROM). He exhibits normal range of motion (but with pain), no bony tenderness, normal pulse and normal strength.       Right elbow: He exhibits normal range of motion (pain in the biceps with supination), no swelling, no effusion, no deformity and no laceration. No tenderness (but palpation in the antecubital fossa causes pain in the biceps) found.       Right wrist: Normal.  Neurological: He is alert and oriented to person, place, and time. Gait normal.  Skin: Skin is warm and dry.  Psychiatric: Mood, memory, affect and judgment normal.     Assessment and Plan: 1. Acute pain of right shoulder 2. Right arm pain Continues to improve, but slowly, and is performing regular duty. Recommend continued limitation  and refer to PT. Re-evaluate here in 4 weeks. - Ambulatory referral to Physical Therapy      Fara Chute, PA-C Physician Assistant-Certified [facility]

## 2015-12-26 NOTE — Patient Instructions (Signed)
     IF you received an x-ray today, you will receive an invoice from [facility]. Please contact [facility] at [phone] with questions or concerns regarding your invoice.   IF you received labwork today, you will receive an invoice from [facility]. Please contact [facility] at [phone] with questions or concerns regarding your invoice.   Our billing staff will not be able to assist you with questions regarding bills from these companies.  You will be contacted with the lab results as soon as they are available. The fastest way to get your results is to activate your My Chart account. Instructions are located on the last page of this paperwork. If you have not heard from us regarding the results in 2 weeks, please contact this office.      

## 2016-01-23 ENCOUNTER — Other Ambulatory Visit (INDEPENDENT_AMBULATORY_CARE_PROVIDER_SITE_OTHER): Payer: 59 | Admitting: Physician Assistant

## 2016-01-23 ENCOUNTER — Ambulatory Visit (INDEPENDENT_AMBULATORY_CARE_PROVIDER_SITE_OTHER): Payer: Worker's Compensation | Admitting: Physician Assistant

## 2016-01-23 ENCOUNTER — Encounter: Payer: Self-pay | Admitting: Physician Assistant

## 2016-01-23 VITALS — BP 153/89 | HR 103 | Temp 98.0°F | Resp 18 | Ht 66.0 in | Wt 294.0 lb

## 2016-01-23 DIAGNOSIS — Z6841 Body Mass Index (BMI) 40.0 and over, adult: Secondary | ICD-10-CM

## 2016-01-23 DIAGNOSIS — Z23 Encounter for immunization: Secondary | ICD-10-CM | POA: Diagnosis not present

## 2016-01-23 DIAGNOSIS — F17200 Nicotine dependence, unspecified, uncomplicated: Secondary | ICD-10-CM

## 2016-01-23 DIAGNOSIS — M25511 Pain in right shoulder: Secondary | ICD-10-CM

## 2016-01-23 DIAGNOSIS — I10 Essential (primary) hypertension: Secondary | ICD-10-CM

## 2016-01-23 DIAGNOSIS — M79601 Pain in right arm: Secondary | ICD-10-CM | POA: Diagnosis not present

## 2016-01-23 DIAGNOSIS — R0683 Snoring: Secondary | ICD-10-CM

## 2016-01-23 MED ORDER — LISINOPRIL-HYDROCHLOROTHIAZIDE 10-12.5 MG PO TABS: 1.0000 | ORAL_TABLET | Freq: Every day | ORAL | 3 refills | Status: DC

## 2016-01-23 NOTE — Progress Notes (Deleted)
   Patient ID: Randy Morgan, male     DOB: 04/04/1965, 50 y.o.    MRN: [id number]  PCP: No PCP Per Patient  Chief Complaint  Patient presents with  . Follow-up    workers comp/ pain has gotten better     Subjective:   This patient is new to {PERSONS; PROVIDER:[id number]} and presents for evaluation of ***. He is accompanied by his wife.  Legs are swollen. Has been present for months, and getting worse. SOB, intermittent. Occurs with exertion. Got a bike for Christmas. Is cutting out smoking again (he quit last time he started riding, but when his bike was stollen, he started again). He falls asleep immediately whenever he sits down. Wife reports that he snores and stops breathing.   ***.  Review of Systems   Prior to Admission medications   Medication Sig Start Date End Date Taking? Authorizing Provider  HYDROcodone-acetaminophen (NORCO) 5-325 MG tablet Take 1 tablet by mouth every 6 (six) hours as needed. Patient not taking: Reported on 01/23/2016 12/15/15   Harrison Mons, PA-C  meloxicam (MOBIC) 15 MG tablet Take 1 tablet (15 mg total) by mouth daily. Patient not taking: Reported on 01/23/2016 12/15/15   Harrison Mons, PA-C     No Known Allergies   Patient Active Problem List   Diagnosis Date Noted  . Left arm weakness 12/19/2015  . BMI 45.0-49.9, adult ([place]) 12/15/2015  . Elevated blood pressure reading 12/15/2015  . Smoker 12/15/2015     Family History  Problem Relation Age of Onset  . Heart disease Mother   . Diabetes Mother   . Asthma Mother   . Heart disease Father   . Asthma Sister   . Heart disease Brother   . Heart attack Brother      Social History   Social History  . Marital status: Married    Spouse name: N/A  . Number of children: 2  . Years of education: 12th grade + 2 years college   Occupational History  . makes concrete blocks and bricks    Social History Main Topics  . Smoking status: Current Every Day Smoker    Packs/day:  0.50    Years: 25.00    Types: Cigarettes  . Smokeless tobacco: Never Used  . Alcohol use 1.2 oz/week    2 Cans of beer per week  . Drug use: No  . Sexual activity: Not on file   Other Topics Concern  . Not on file   Social History Narrative   Lives with his wife and daughter.   Other child lives independently in [place].         Objective:  Physical Exam           Assessment & Plan:  ***

## 2016-01-23 NOTE — Progress Notes (Signed)
Randy Morgan [birth year] 50 y.o.   Chief Complaint  Patient presents with  . Follow-up    workers comp/ pain has gotten better     Presents for evaluation of work-related complaint.  Date of Injury: 12/14/2015  History of Present Illness: At work 11/16, putting together some molds, reached to lift a metal part and felt a very sharp pain in the RIGHT shoulder. Felt as though the shoulder popped out of joint. Hadnever experienced a dislocated shoulder. Wasn't able to raise the arm, unable to supinate the forearm. Was not able to continue work.  LEFT hand dominant.  Reportednumbness and tingling in the elbow and fingertips. Almost like he hit the funny bone, that never stops. No weakness. Able to grasp without pain, but reaching out and grasping exacerbates the pain. No neck pain.  Took ibuprofen (approximately 8 of the 200 mg tablets over night). Soaked in a warm bath with rubbing alcohol in the water, which seemed to help some initially. Later on, it felt tight.  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 Previously seen by orthopedics on a different WC case. He uses his RIGHT hand more as a result.  Radiographs revealed no bony deformity and he was prescribed meloxicam and PRN hydrocodone.  At follow-up 12/19/2015 the sharp pain had moved out of the shoulder and into the biceps.  Also less severe.  Had regained ROM of the shoulder. Pain with sweeping and lifting boxes, but significantly improved.  At follow-up 12/26/2015 he reported continued improvement, but not resolution.  Pain rated 6-7/10. He had pain in the biceps area with flexion of the elbow, pain in the shoulder with overhead flexion and ABduction, and these movements also caused tingling in the forearm.  He was doing his regular duty work, "toughing through it," and stopped the prescribed medications, though they helped. The hydrocodone caused him to feel "woozy."  Ibuprofen had helped some. He was advised to go back to the limited duty and was referred to PT.  He is not taking meloxicam or ibuprofen. Isn't taking meloxicam, either, but he does have some left. He is back to his normal duty without pain, occasional twinges.  PT went well. He has another visit scheduled, and then will address the LEFT arm problem that is unrelated.    ROS As above.    Current medications and allergies reviewed and updated. Past medical history, family history, social history have been reviewed and updated.   Physical Exam  Constitutional: He is oriented to person, place, and time and well-developed, well-nourished, and in no distress.  BP (!) 153/89 (BP Location: Left Arm, Patient Position: Sitting, Cuff Size: Large)   Pulse (!) 103   Temp 98 F (36.7 C) (Oral)   Resp 18   Ht 5\' 6"  (1.676 m)   Wt 294 lb (133.4 kg)   SpO2 96%   BMI 47.45 kg/m    Eyes: Conjunctivae are normal.  Pulmonary/Chest: Effort normal.  Musculoskeletal:       Right shoulder: He exhibits normal range of motion, no tenderness, no bony tenderness, no deformity and normal strength.  Neurological: He is alert and oriented to person, place, and time. Gait normal.  Skin: Skin is warm and dry.  Psychiatric: Mood, memory, affect and judgment normal.     Assessment and Plan: 1. Acute pain of right shoulder 2. Right arm pain Normal duty. Use meloxicam PRN. Follow-up PRN recurrent symptoms.  Establish with primary care  for health maintenance and to address elevated BP.   Fara Chute, PA-C Physician Assistant-Certified [facility]

## 2016-01-23 NOTE — Progress Notes (Signed)
Patient ID: Randy Morgan, male     DOB: Nov 05, 1965, 50 y.o.    MRN: [id number]  PCP: No PCP Per Patient   Subjective:   This patient is new to me for primary care and presents for evaluation of elevated blood pressure. He is accompanied by his wife. I have ben seeing him for a WC injury to the RIGHT shoulder, which is now resolved. He's been delaying establishing for primary care until the shoulder was better and now needs to address his health maintenance.  Legs are swollen. Has been present for months, and getting worse. SOB, intermittent. Occurs with exertion. Got a bike for Christmas. Is cutting out smoking again (he quit last time he started riding, but when his bike was stollen, he started again). He falls asleep immediately whenever he sits down. Wife reports that he snores and stops breathing.  Review of Systems No CP, HA, dizziness, nausea, vomiting or diarrhea. No urinary urgency, hematuria, dysuria.   Prior to Admission medications   Medication Sig Start Date End Date Taking? Authorizing Provider  meloxicam (MOBIC) 15 MG tablet Take 1 tablet (15 mg total) by mouth daily. Patient not taking: Reported on 01/23/2016 12/15/15   Harrison Mons, PA-C     No Known Allergies   Patient Active Problem List   Diagnosis Date Noted  . Left arm weakness 12/19/2015  . BMI 45.0-49.9, adult ([place]) 12/15/2015  . Essential hypertension, benign 12/15/2015  . Smoker 12/15/2015     Family History  Problem Relation Age of Onset  . Heart disease Mother   . Diabetes Mother   . Asthma Mother   . Heart disease Father   . Asthma Sister   . Heart disease Brother   . Heart attack Brother      Social History   Social History  . Marital status: Married    Spouse name: N/A  . Number of children: 2  . Years of education: 12th grade + 2 years college   Occupational History  . makes concrete blocks and bricks    Social History Main Topics  . Smoking status: Current Every  Day Smoker    Packs/day: 0.50    Years: 25.00    Types: Cigarettes  . Smokeless tobacco: Never Used  . Alcohol use 1.2 oz/week    2 Cans of beer per week  . Drug use: No  . Sexual activity: Not on file   Other Topics Concern  . Not on file   Social History Narrative   Lives with his wife and daughter.   Other child lives independently in [place].         Objective:  Physical Exam  Constitutional: He is oriented to person, place, and time. He appears well-developed and well-nourished. He is active and cooperative. No distress.  BP (!) 153/89 (BP Location: Left Arm, Patient Position: Sitting, Cuff Size: Large)   Pulse (!) 103   Temp 98 F (36.7 C) (Oral)   Resp 18   Ht 5\' 6"  (1.676 m)   Wt 294 lb (133.4 kg)   SpO2 96%   BMI 47.45 kg/m   HENT:  Head: Normocephalic and atraumatic.  Right Ear: Hearing normal.  Left Ear: Hearing normal.  Eyes: Conjunctivae are normal. No scleral icterus.  Neck: Normal range of motion. Neck supple. No thyromegaly present.  Cardiovascular: Normal rate, regular rhythm and normal heart sounds.   Pulses:      Radial pulses are 2+ on the right side, and 2+ on  the left side.  Pulmonary/Chest: Effort normal and breath sounds normal.  Lymphadenopathy:       Head (right side): No tonsillar, no preauricular, no posterior auricular and no occipital adenopathy present.       Head (left side): No tonsillar, no preauricular, no posterior auricular and no occipital adenopathy present.    He has no cervical adenopathy.       Right: No supraclavicular adenopathy present.       Left: No supraclavicular adenopathy present.  Neurological: He is alert and oriented to person, place, and time. No sensory deficit.  Skin: Skin is warm, dry and intact. No rash noted. No cyanosis or erythema. Nails show no clubbing.  Psychiatric: He has a normal mood and affect. His speech is normal and behavior is normal.      Assessment & Plan:  1. Essential hypertension,  benign Uncontrolled. Start combination therapy. Anticipate reduction in dose with treatment of suspected OSA and weight loss. - CBC with Differential/Platelet; Future - Comprehensive metabolic panel; Future - TSH; Future - lisinopril-hydrochlorothiazide (PRINZIDE,ZESTORETIC) 10-12.5 MG tablet; Take 1 tablet by mouth daily.  Dispense: 90 tablet; Refill: 3  2. BMI 45.0-49.9, adult ([facility]) Increase exercise. Healthy eating.  3. Smoker Encouraged smoking cessation.  4. Snoring Suspect OSA and need for CPAP. Will improve with weight loss. - Ambulatory referral to Sleep Studies  5. Need for Tdap vaccination - Tdap vaccine greater than or equal to 7yo IM  6. Need for influenza vaccination - Flu Vaccine QUAD 36+ mos IM - Care order/instruction:  Re-evaluate in 4-6 weeks.   Fara Chute, PA-C Physician Assistant-Certified [facility]

## 2016-01-23 NOTE — Patient Instructions (Signed)
     IF you received an x-ray today, you will receive an invoice from [facility]. Please contact [facility] at [phone] with questions or concerns regarding your invoice.   IF you received labwork today, you will receive an invoice from [facility]. Please contact [facility] at [phone] with questions or concerns regarding your invoice.   Our billing staff will not be able to assist you with questions regarding bills from these companies.  You will be contacted with the lab results as soon as they are available. The fastest way to get your results is to activate your My Chart account. Instructions are located on the last page of this paperwork. If you have not heard from us regarding the results in 2 weeks, please contact this office.     

## 2016-01-24 LAB — COMPREHENSIVE METABOLIC PANEL
ALK PHOS: 66 IU/L (ref 39–117)
ALT: 21 IU/L (ref 0–44)
AST: 21 IU/L (ref 0–40)
Albumin/Globulin Ratio: 1.3 (ref 1.2–2.2)
Albumin: 4.1 g/dL (ref 3.5–5.5)
BILIRUBIN TOTAL: 0.5 mg/dL (ref 0.0–1.2)
BUN / CREAT RATIO: 20 (ref 9–20)
BUN: 22 mg/dL (ref 6–24)
CHLORIDE: 103 mmol/L (ref 96–106)
CO2: 23 mmol/L (ref 18–29)
Calcium: 9.4 mg/dL (ref 8.7–10.2)
Creatinine, Ser: 1.12 mg/dL (ref 0.76–1.27)
GFR calc non Af Amer: 76 mL/min/{1.73_m2} (ref >59)
GFR, EST AFRICAN AMERICAN: 88 mL/min/{1.73_m2} (ref >59)
Globulin, Total: 3.1 g/dL (ref 1.5–4.5)
Glucose: 103 mg/dL — ABNORMAL HIGH (ref 65–99)
POTASSIUM: 4.2 mmol/L (ref 3.5–5.2)
Sodium: 145 mmol/L — ABNORMAL HIGH (ref 134–144)
TOTAL PROTEIN: 7.2 g/dL (ref 6.0–8.5)

## 2016-01-24 LAB — CBC WITH DIFFERENTIAL/PLATELET
BASOS: 0 %
Basophils Absolute: 0 10*3/uL (ref 0.0–0.2)
EOS (ABSOLUTE): 0.2 10*3/uL (ref 0.0–0.4)
Eos: 3 %
Hematocrit: 41.9 % (ref 37.5–51.0)
Hemoglobin: 14.4 g/dL (ref 13.0–17.7)
Immature Grans (Abs): 0 10*3/uL (ref 0.0–0.1)
Immature Granulocytes: 0 %
LYMPHS ABS: 2.7 10*3/uL (ref 0.7–3.1)
LYMPHS: 39 %
MCH: 29.2 pg (ref 26.6–33.0)
MCHC: 34.4 g/dL (ref 31.5–35.7)
MCV: 85 fL (ref 79–97)
Monocytes Absolute: 0.5 10*3/uL (ref 0.1–0.9)
Monocytes: 7 %
NEUTROS ABS: 3.4 10*3/uL (ref 1.4–7.0)
Neutrophils: 51 %
PLATELETS: 225 10*3/uL (ref 150–379)
RBC: 4.93 x10E6/uL (ref 4.14–5.80)
RDW: 13.8 % (ref 12.3–15.4)
WBC: 6.8 10*3/uL (ref 3.4–10.8)

## 2016-01-24 LAB — TSH: TSH: 3.06 u[IU]/mL (ref 0.450–4.500)

## 2016-01-30 ENCOUNTER — Encounter: Payer: Self-pay | Admitting: Physician Assistant

## 2016-01-30 ENCOUNTER — Ambulatory Visit (INDEPENDENT_AMBULATORY_CARE_PROVIDER_SITE_OTHER): Payer: 59 | Admitting: Physician Assistant

## 2016-01-30 VITALS — BP 164/110 | HR 105 | Temp 97.8°F | Resp 16 | Ht 66.0 in | Wt 297.0 lb

## 2016-01-30 DIAGNOSIS — R05 Cough: Secondary | ICD-10-CM

## 2016-01-30 DIAGNOSIS — R059 Cough, unspecified: Secondary | ICD-10-CM

## 2016-01-30 DIAGNOSIS — R739 Hyperglycemia, unspecified: Secondary | ICD-10-CM | POA: Diagnosis not present

## 2016-01-30 DIAGNOSIS — R062 Wheezing: Secondary | ICD-10-CM | POA: Diagnosis not present

## 2016-01-30 DIAGNOSIS — Z6841 Body Mass Index (BMI) 40.0 and over, adult: Secondary | ICD-10-CM | POA: Diagnosis not present

## 2016-01-30 DIAGNOSIS — Z0000 Encounter for general adult medical examination without abnormal findings: Secondary | ICD-10-CM

## 2016-01-30 DIAGNOSIS — F17200 Nicotine dependence, unspecified, uncomplicated: Secondary | ICD-10-CM | POA: Diagnosis not present

## 2016-01-30 DIAGNOSIS — Z125 Encounter for screening for malignant neoplasm of prostate: Secondary | ICD-10-CM

## 2016-01-30 DIAGNOSIS — Z1211 Encounter for screening for malignant neoplasm of colon: Secondary | ICD-10-CM | POA: Diagnosis not present

## 2016-01-30 DIAGNOSIS — I10 Essential (primary) hypertension: Secondary | ICD-10-CM | POA: Diagnosis not present

## 2016-01-30 DIAGNOSIS — R0981 Nasal congestion: Secondary | ICD-10-CM

## 2016-01-30 DIAGNOSIS — Z114 Encounter for screening for human immunodeficiency virus [HIV]: Secondary | ICD-10-CM

## 2016-01-30 MED ORDER — ALBUTEROL SULFATE HFA 108 (90 BASE) MCG/ACT IN AERS: 2.0000 | INHALATION_SPRAY | RESPIRATORY_TRACT | 1 refills | Status: DC | PRN

## 2016-01-30 MED ORDER — AZELASTINE HCL 0.15 % NA SOLN
2.0000 | Freq: Two times a day (BID) | NASAL | 0 refills | Status: DC
Start: 2016-01-30 — End: 2016-02-27

## 2016-01-30 MED ORDER — BENZONATATE 100 MG PO CAPS: 100.0000 mg | ORAL_CAPSULE | Freq: Three times a day (TID) | ORAL | 0 refills | Status: DC | PRN

## 2016-01-30 MED ORDER — LISINOPRIL-HYDROCHLOROTHIAZIDE 10-12.5 MG PO TABS: 2.0000 | ORAL_TABLET | Freq: Every day | ORAL | 3 refills | Status: DC

## 2016-01-30 NOTE — Patient Instructions (Addendum)
1. Eliminate sugary drinks 2. INCREASE the vegetables in your diet. Your goal is 3-5 servings each day. A serving is 1/2 cup. Broccoli. Carrots. Green beans. 4. Drink at least 64 ounces of water every day.  INCREASE the lisinoprilHCT to 2 tablets each morning.   IF you received an x-ray today, you will receive an invoice from [facility]. Please contact [facility] at [phone] with questions or concerns regarding your invoice.   IF you received labwork today, you will receive an invoice from [facility]. Please contact [facility] at [phone] with questions or concerns regarding your invoice.   Our billing staff will not be able to assist you with questions regarding bills from these companies.  You will be contacted with the lab results as soon as they are available. The fastest way to get your results is to activate your My Chart account. Instructions are located on the last page of this paperwork. If you have not heard from [place] regarding the results in 2 weeks, please contact this office.     Keeping you healthy  Get these tests  Blood pressure- Have your blood pressure checked once a year by your healthcare provider.  Normal blood pressure is 120/80  Weight- Have your body mass index (BMI) calculated to screen for obesity.  BMI is a measure of body fat based on height and weight. You can also calculate your own BMI at ViewBanking.[email or web address].  Cholesterol- Have your cholesterol checked every year.  Diabetes- Have your blood sugar checked regularly if you have high blood pressure, high cholesterol, have a family history of diabetes or if you are overweight.  Screening for Colon Cancer- Colonoscopy starting at age 41.  Screening may begin sooner depending on your family history and other health conditions. Follow up colonoscopy as directed by your Gastroenterologist.  Screening for Prostate Cancer- Both blood work (PSA) and a rectal exam help screen for Prostate  Cancer.  Screening begins at age 8 with African-American men and at age 24 with Caucasian men.  Screening may begin sooner depending on your family history.  Take these medicines  Aspirin- One aspirin daily can help prevent Heart disease and Stroke.  Flu shot- Every fall.  Tetanus- Every 10 years.  Zostavax- Once after the age of 46 to prevent Shingles.  Pneumonia shot- Once after the age of 40; if you are younger than 80, ask your healthcare provider if you need a Pneumonia shot.  Take these steps  Don't smoke- If you do smoke, talk to your doctor about quitting.  For tips on how to quit, go to www.smokefree.gov or call 1-800-QUIT-NOW.  Be physically active- Exercise 5 days a week for at least 30 minutes.  If you are not already physically active start slow and gradually work up to 30 minutes of moderate physical activity.  Examples of moderate activity include walking briskly, mowing the yard, dancing, swimming, bicycling, etc.  Eat a healthy diet- Eat a variety of healthy food such as fruits, vegetables, low fat milk, low fat cheese, yogurt, lean meant, poultry, fish, beans, tofu, etc. For more information go to www.thenutritionsource.org  Drink alcohol in moderation- Limit alcohol intake to less than two drinks a day. Never drink and drive.  Dentist- Brush and floss twice daily; visit your dentist twice a year.  Depression- Your emotional health is as important as your physical health. If you're feeling down, or losing interest in things you would normally enjoy please talk to your healthcare provider.  Eye exam- Visit your  eye doctor every year.  Safe sex- If you may be exposed to a sexually transmitted infection, use a condom.  Seat belts- Seat belts can save your life; always wear one.  Smoke/Carbon Monoxide detectors- These detectors need to be installed on the appropriate level of your home.  Replace batteries at least once a year.  Skin cancer- When out in the sun,  cover up and use sunscreen 15 SPF or higher.  Violence- If anyone is threatening you, please tell your healthcare provider.  Living Will/ Health care power of attorney- Speak with your healthcare provider and family.   Did you know that you begin to benefit from quitting smoking within the first twenty minutes? It's TRUE.  At 20 minutes: -blood pressure decreases -pulse rate drops -body temperature of hands and feet increases  At 8 hours: -carbon monoxide level in blood drops to normal -oxygen level in blood increases to normal  At 24 hours: -the chance of heart attack decreases  At 48 hours: -nerve endings start regrowing -ability to smell and taste is enhanced  2 weeks-3 months: -circulation improves -walking becomes easier -lung function improves  1-9 months: -coughing, sinus congestion, fatigue and shortness of breath decreases  1 year: -excess risk of heart disease is decreased to HALF that of a smoker  5 years: Stroke risk is reduced to that of people who have never smoked  10 years: -risk of lung cancer drops to as little as half that of continuing smokers -risk of cancer of the mouth, throat, esophagus, bladder, kidney and pancreas decreases -risk of ulcer decreases  15 years -risk of heart disease is now similar to that of people who have never smoked -risk of death returns to nearly the level of people who have never smoked

## 2016-01-30 NOTE — Progress Notes (Signed)
Patient ID: Randy Morgan, male    DOB: 1965/05/22, 51 y.o.   MRN: [id number]  PCP: No PCP Per Patient  Chief Complaint  Patient presents with  . Annual Exam    Subjective:   Presents for [employer]. He is accompanied by his wife.  He is not fasting. Has cut back on Gatorade since his last visit. He needs a colonoscopy.  Notes less LE edema since starting the lisinoprilHCT. Has neurology visit for suspected OSA on 02/19/2016.  CMET was normal except glucose was 103. TSH and CBC were normal.  He developed nasal congestion, cough since receiving his flu vaccine on 01/23/2016.  Breakfast: Egg McMuffin with a soda.  Lunch: Leftovers from his previous evening meal OR 2 pieces of fried chicken and potato wedges (from [facility]) or Double [employer] [facility]) with soda.  Snack: Usually after work, leftovers from a previous meal (a full meal-sized snack), soda.  Supper: Chicken or fish (grilled) and rice, salad (ranch dressing) Kool-Aid or Soda, cranberry juice Dessert: Freeze-pops, Lay's Potato Chips, "Everything, like he's on a binge," per his wife.   Patient Active Problem List   Diagnosis Date Noted  . Left arm weakness 12/19/2015  . BMI 45.0-49.9, adult ([place]) 12/15/2015  . Essential hypertension, benign 12/15/2015  . Smoker 12/15/2015    History reviewed. No pertinent past medical history.   Prior to Admission medications   Medication Sig Start Date End Date Taking? Authorizing Provider  lisinopril-hydrochlorothiazide (PRINZIDE,ZESTORETIC) 10-12.5 MG tablet Take 1 tablet by mouth daily. 01/23/16  Yes Bettey Muraoka, PA-C  meloxicam (MOBIC) 15 MG tablet Take 1 tablet (15 mg total) by mouth daily. 12/15/15  Yes Harrison Mons, PA-C    No Known Allergies  Past Surgical History:  Procedure Laterality Date  . NO PAST SURGERIES      Family History  Problem Relation Age of Onset  . Heart disease Mother   . Diabetes Mother   . Asthma  Mother   . Heart disease Father   . Asthma Sister   . Heart disease Brother   . Heart attack Brother     Social History   Social History  . Marital status: Married    Spouse name: N/A  . Number of children: 2  . Years of education: 12th grade + 2 years college   Occupational History  . makes concrete blocks and bricks    Social History Main Topics  . Smoking status: Current Every Day Smoker    Packs/day: 0.50    Years: 25.00    Types: Cigarettes  . Smokeless tobacco: Never Used  . Alcohol use 1.2 oz/week    2 Cans of beer per week  . Drug use: No  . Sexual activity: Not Asked   Other Topics Concern  . None   Social History Narrative   Lives with his wife and daughter.   Other child lives independently in [place].       Review of Systems  Constitutional: Positive for appetite change (increased hunger), fatigue and unexpected weight change (has lost a couple of pounds, but notes his weight is up and down). Negative for activity change, chills, diaphoresis and fever.  HENT: Positive for dental problem and sinus pressure. Negative for congestion, drooling, ear discharge, ear pain, facial swelling, hearing loss, mouth sores, nosebleeds, postnasal drip, rhinorrhea, sinus pain, sneezing, sore throat, tinnitus, trouble swallowing and voice change.   Eyes: Positive for photophobia and itching. Negative for pain, discharge, redness and visual  disturbance.  Respiratory: Positive for apnea, cough, choking, chest tightness, shortness of breath, wheezing and stridor.   Cardiovascular: Positive for leg swelling. Negative for chest pain and palpitations.  Gastrointestinal: Positive for abdominal pain. Negative for abdominal distention, anal bleeding, blood in stool, constipation, diarrhea, nausea, rectal pain and vomiting.  Endocrine: Positive for polydipsia, polyphagia and polyuria. Negative for cold intolerance and heat intolerance.  Genitourinary: Positive for frequency. Negative  for decreased urine volume, difficulty urinating, discharge, dysuria, enuresis, flank pain, genital sores, hematuria, penile pain, penile swelling, scrotal swelling, testicular pain and urgency.  Musculoskeletal: Positive for back pain. Negative for arthralgias, gait problem, joint swelling, myalgias, neck pain and neck stiffness.  Skin: Negative.   Allergic/Immunologic: Negative.   Neurological: Positive for weakness and numbness. Negative for dizziness, tremors, seizures, syncope, facial asymmetry, speech difficulty, light-headedness and headaches.  Hematological: Negative.   Psychiatric/Behavioral: Negative.         Objective:  Physical Exam  Constitutional: He is oriented to person, place, and time. He appears well-developed and well-nourished. He is active and cooperative.  Non-toxic appearance. He does not have a sickly appearance. He does not appear ill. No distress.  BP (!) 164/110 (BP Location: Left Arm, Cuff Size: Large)   Pulse (!) 105   Temp 97.8 F (36.6 C) (Oral)   Resp 16   Ht 5\' 6"  (1.676 m)   Wt 297 lb (134.7 kg)   SpO2 95%   BMI 47.94 kg/m    HENT:  Head: Normocephalic and atraumatic.  Right Ear: Hearing, tympanic membrane, external ear and ear canal normal.  Left Ear: Hearing, tympanic membrane, external ear and ear canal normal.  Nose: Nose normal.  Mouth/Throat: Uvula is midline, oropharynx is clear and moist and mucous membranes are normal. He does not have dentures. No oral lesions. No trismus in the jaw. Normal dentition. No dental abscesses, uvula swelling, lacerations or dental caries.  Eyes: Conjunctivae, EOM and lids are normal. Pupils are equal, round, and reactive to light. Right eye exhibits no discharge. Left eye exhibits no discharge. No scleral icterus.  Fundoscopic exam:      The right eye shows no arteriolar narrowing, no AV nicking, no exudate, no hemorrhage and no papilledema.       The left eye shows no arteriolar narrowing, no AV nicking, no  exudate, no hemorrhage and no papilledema.  Neck: Normal range of motion, full passive range of motion without pain and phonation normal. Neck supple. No spinous process tenderness and no muscular tenderness present. No neck rigidity. No tracheal deviation, no edema, no erythema and normal range of motion present. No thyromegaly present.  Cardiovascular: Normal rate, regular rhythm, S1 normal, S2 normal, normal heart sounds, intact distal pulses and normal pulses.  Exam reveals no gallop and no friction rub.   No murmur heard. Pulmonary/Chest: Effort normal and breath sounds normal. No respiratory distress. He has no wheezes. He has no rales.  Abdominal: Soft. Normal appearance and bowel sounds are normal. He exhibits no distension and no mass. There is no hepatosplenomegaly. There is no tenderness. There is no rebound and no guarding. No hernia.  Musculoskeletal: Normal range of motion. He exhibits no edema or tenderness.       Cervical back: Normal. He exhibits normal range of motion, no tenderness, no bony tenderness, no swelling, no edema, no deformity, no laceration, no pain, no spasm and normal pulse.       Thoracic back: Normal.       Lumbar back:  Normal.  Lymphadenopathy:       Head (right side): No submental, no submandibular, no tonsillar, no preauricular, no posterior auricular and no occipital adenopathy present.       Head (left side): No submental, no submandibular, no tonsillar, no preauricular, no posterior auricular and no occipital adenopathy present.    He has no cervical adenopathy.       Right: No supraclavicular adenopathy present.       Left: No supraclavicular adenopathy present.  Neurological: He is alert and oriented to person, place, and time. He has normal strength and normal reflexes. He displays no tremor. No cranial nerve deficit. He exhibits normal muscle tone. Coordination and gait normal.  Skin: Skin is warm, dry and intact. No abrasion, no ecchymosis, no  laceration, no lesion and no rash noted. He is not diaphoretic. No cyanosis or erythema. No pallor. Nails show no clubbing.  Psychiatric: He has a normal mood and affect. His speech is normal and behavior is normal. Judgment and thought content normal. Cognition and memory are normal.           Assessment & Plan:  1. Annual physical exam Age appropriate anticipatory guidance provided.  2. Screening for HIV (human immunodeficiency virus) - HIV 1 RNA quant-no reflex-bld; Future  3. BMI 45.0-49.9, adult ([place]) - Lipid panel; Future  4. Essential hypertension, benign Not controlled. Increase lisinoprilHCTZ to 2 tablets daily. - Care order/instruction: - lisinopril-hydrochlorothiazide (PRINZIDE,ZESTORETIC) 10-12.5 MG tablet; Take 2 tablets by mouth daily.  Dispense: 180 tablet; Refill: 3  5. Smoker Smoking cessation again encouraged.  6. Hyperglycemia - Hemoglobin A1c  7. Screening for prostate cancer - PSA; Future  8. Nasal congestion - Azelastine HCl 0.15 % SOLN; Place 2 sprays into both nostrils 2 (two) times daily.  Dispense: 30 mL; Refill: 0  9. 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 - albuterol (PROVENTIL HFA;VENTOLIN HFA) 108 (90 Base) MCG/ACT inhaler; Inhale 2 puffs into the lungs every 4 (four) hours as needed for wheezing or shortness of breath (cough, shortness of breath or wheezing.).  Dispense: 1 Inhaler; Refill: 1  10. Cough - benzonatate (TESSALON) 100 MG capsule; Take 1-2 capsules (100-200 mg total) by mouth 3 (three) times daily as needed for cough.  Dispense: 40 capsule; Refill: 0  11. Screening for colon cancer - Ambulatory referral to Gastroenterology   Fara Chute, PA-C Physician Assistant-Certified [facility] at [place]

## 2016-01-31 LAB — HEMOGLOBIN A1C
Est. average glucose Bld gHb Est-mCnc: 120 mg/dL
HEMOGLOBIN A1C: 5.8 % — AB (ref 4.8–5.6)

## 2016-02-06 ENCOUNTER — Other Ambulatory Visit: Payer: Self-pay

## 2016-02-06 NOTE — Telephone Encounter (Signed)
Pharmacy advised  

## 2016-02-06 NOTE — Telephone Encounter (Signed)
Ok to change to generic

## 2016-02-06 NOTE — Telephone Encounter (Signed)
Needs generic azelastine, brand is not covered. Please rx and send if appropriate.

## 2016-02-19 ENCOUNTER — Ambulatory Visit (INDEPENDENT_AMBULATORY_CARE_PROVIDER_SITE_OTHER): Payer: 59 | Admitting: Neurology

## 2016-02-19 ENCOUNTER — Encounter: Payer: Self-pay | Admitting: Neurology

## 2016-02-19 VITALS — BP 148/68 | HR 102 | Resp 20 | Ht 66.0 in | Wt 292.0 lb

## 2016-02-19 DIAGNOSIS — R51 Headache: Secondary | ICD-10-CM | POA: Diagnosis not present

## 2016-02-19 DIAGNOSIS — R400 Somnolence: Secondary | ICD-10-CM | POA: Diagnosis not present

## 2016-02-19 DIAGNOSIS — G4733 Obstructive sleep apnea (adult) (pediatric): Secondary | ICD-10-CM

## 2016-02-19 DIAGNOSIS — F17200 Nicotine dependence, unspecified, uncomplicated: Secondary | ICD-10-CM | POA: Diagnosis not present

## 2016-02-19 DIAGNOSIS — R351 Nocturia: Secondary | ICD-10-CM

## 2016-02-19 DIAGNOSIS — R519 Headache, unspecified: Secondary | ICD-10-CM

## 2016-02-19 NOTE — Progress Notes (Signed)
Subjective:    Patient ID: Rosaire Emerine is a 51 y.o. male.  HPI     Star Age, MD, PhD [facility] [address], [address] [place], [place] [address]  Dear Domingo Mend,   I saw your patient, Jann Gales, upon your kind request in my neurologic clinic today for initial consultation of his sleep disorder, in particular, concern for underlying obstructive sleep apnea. The patient is unaccompanied today. As you know, Mr. Noteboom is a 51 year old right-handed gentleman with an underlying medical history of hypertension, smoking and morbid obesity, who reports snoring and excessive daytime somnolence. I reviewed her office note from 01-2016. His Epworth sleepiness score is 20 out of 24 today, his fatigue score is 22 out of 63. 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 He has occasional morning headaches. He has significant nocturia, gets up about 4-5 times on any given night. Bedtime is around 10 PM and he watches the news on TV in bed and turns the TV off after that. Wake up time is around 5:30 AM, he is usually at work at 6 AM until 4:05 PM typically. He wakes up with headaches occasionally. He is not aware of any family history of OSA. His wife has noted loud snoring and witnessed apneic pauses while he is asleep. He denies any significant restless leg symptoms. He lives at home with his wife and youngest child, age 60. He has 2 other grown children and 5 grandchildren. For the past 2 months he has had left arm weakness. He is in the process of seeing a sports medicine specialist for this. He has reduced his smoking, smokes about half a pack per day, very occasional alcohol and caffeine in the form of soda, 1-2 per day, usually call or sometimes noncaffeine soda.  His Past Medical History Is Significant For: Past Medical History:  Diagnosis Date  . Hypertension      His Past Surgical History Is Significant For: Past Surgical History:  Procedure Laterality Date  . NO PAST SURGERIES      His Family History Is Significant For: Family History  Problem Relation Age of Onset  . Heart disease Mother   . Diabetes Mother   . Asthma Mother   . Heart disease Father   . Asthma Sister   . Heart disease Brother   . Heart attack Brother     His Social History Is Significant For: Social History   Social History  . Marital status: Married    Spouse name: N/A  . Number of children: 2  . Years of education: 12th grade + 2 years college   Occupational History  . makes concrete blocks and bricks    Social History Main Topics  . Smoking status: Current Some Day Smoker    Packs/day: 0.50    Years: 25.00    Types: Cigarettes  . Smokeless tobacco: Never Used  . Alcohol use 1.2 oz/week    2 Cans of beer per week  . Drug use: No  . Sexual activity: Not Asked   Other Topics Concern  . None   Social History Narrative   Lives with his wife and daughter.   Other child lives independently in [place].   Drinks about 3 sodas a day     His Allergies Are:  No Known Allergies:   His Current Medications Are:  Outpatient Encounter Prescriptions  as of 02/19/2016  Medication Sig  . albuterol (PROVENTIL HFA;VENTOLIN HFA) 108 (90 Base) MCG/ACT inhaler Inhale 2 puffs into the lungs every 4 (four) hours as needed for wheezing or shortness of breath (cough, shortness of breath or wheezing.).  Marland Kitchen Azelastine HCl 0.15 % SOLN Place 2 sprays into both nostrils 2 (two) times daily. (Patient taking differently: Place 2 sprays into both nostrils 2 (two) times daily. Ok to substitute with generic)  . benzonatate (TESSALON) 100 MG capsule Take 1-2 capsules (100-200 mg total) by mouth 3 (three) times daily as needed for cough.  Marland Kitchen lisinopril-hydrochlorothiazide (PRINZIDE,ZESTORETIC) 10-12.5 MG tablet Take 2 tablets by mouth daily.  . meloxicam (MOBIC) 15 MG tablet Take 1  tablet (15 mg total) by mouth daily.   No facility-administered encounter medications on file as of 02/19/2016.   :  Review of Systems:  Out of a complete 14 point review of systems, all are reviewed and negative with the exception of these symptoms as listed below: Review of Systems  Neurological:       Patient gets up to go to the bathroom 4-5 times a night, falls asleep when sitting still, snores, witnessed apnea, wakes up feeling tired, daytime fatigue.    Epworth Sleepiness Scale 0= would never doze 1= slight chance of dozing 2= moderate chance of dozing 3= high chance of dozing  Sitting and reading:3 Watching TV:3 Sitting inactive in a public place (ex. Theater or meeting):3 As a passenger in a car for an hour without a break:3 Lying down to rest in the afternoon:1 Sitting and talking to someone:2 Sitting quietly after lunch (no alcohol):3 In a car, while stopped in traffic:2 Total:20  Objective:  Neurologic Exam  Physical Exam Physical Examination:   Vitals:   02/19/16 1058  BP: (!) 148/68  Pulse: (!) 102  Resp: 20    General Examination: The patient is a very pleasant 51 y.o. male in no acute distress. He appears well-developed and well-nourished and adequately groomed. Work clothes.   HEENT: Normocephalic, atraumatic, pupils are equal, round and reactive to light and accommodation. Funduscopic exam is normal with sharp disc margins noted. Extraocular tracking is good without limitation to gaze excursion or nystagmus noted. Normal smooth pursuit is noted. Hearing is grossly intact. Tympanic membranes are clear bilaterally. Face is symmetric with normal facial animation and normal facial sensation. Speech is clear with no dysarthria noted. There is no hypophonia. There is no lip, neck/head, jaw or voice tremor. Neck is supple with full range of passive and active motion. There are no carotid bruits on auscultation. Oropharynx exam reveals: mild mouth dryness, adequate  dental hygiene and some missing teeth,  marked airway crowding, due to larger uvula, larger tongue, tonsils of 2+, and thicker soft palate. Mallampati is class III. Tongue protrudes centrally and palate elevates symmetrically. Neck size is 17.75 inches. He has a Moderate overbite. Nasal inspection reveals mild nasal mucosal bogginess, mild redness, no septal deviation.   Chest: Clear to auscultation without wheezing, rhonchi or crackles noted on the L but coarse inspiratory rhonchi noted on the right, no wheezing or crackles.  Heart: S1+S2+0, regular and normal without murmurs, rubs or gallops noted.   Abdomen: Soft, non-tender and non-distended with normal bowel sounds appreciated on auscultation.  Extremities: There is trace pitting edema in the distal lower extremities bilaterally. Pedal pulses are intact.  Skin: Warm and dry without trophic changes noted. There are no varicose veins.  Musculoskeletal: exam reveals no obvious joint deformities, tenderness or  joint swelling or erythema, with the exception of decrease ROM L forearm with abnormal muscle bulging    Neurologically:  Mental status: The patient is awake, alert and oriented in all 4 spheres. His immediate and remote memory, attention, language skills and fund of knowledge are appropriate. There is no evidence of aphasia, agnosia, apraxia or anomia. Speech is clear with normal prosody and enunciation. Thought process is linear. Mood is normal and affect is normal.  Cranial nerves II - XII are as described above under HEENT exam. In addition: shoulder shrug is normal with equal shoulder height noted. Motor exam: Normal bulk, strength and tone is noted, with the exception of mild L grip weakness, muscle bulging at the L forearm. There is no drift, tremor or rebound. Romberg is negative. Reflexes are 2+ throughout except decreased L forearm. Fine motor skills and coordination: intact with normal finger taps, normal hand movements, normal  rapid alternating patting, normal foot taps and normal foot agility.  Cerebellar testing: No dysmetria or intention tremor on finger to nose testing. Heel to shin is unremarkable bilaterally. There is no truncal or gait ataxia.  Sensory exam: intact to light touch, pinprick, vibration, temperature sense in the upper and lower extremities.  Gait, station and balance: He stands easily. No veering to one side is noted. No leaning to one side is noted. Posture is age-appropriate and stance is narrow based. Gait shows normal stride length and normal pace. No problems turning are noted. Tandem walk is unremarkable.           Assessment and Plan:  In summary, Ghassan Nile is a very pleasant 51 y.o.-year old male with an underlying medical history of hypertension, smoking and morbid obesity, whose history and physical exam are in keeping with obstructive sleep apnea (OSA), in that he has a history of loud snoring, witnessed apneas, significant nocturia, AM headaches and significant daytime somnolence. I had a long chat with the patient about my findings and the diagnosis of OSA, its prognosis and treatment options. We talked about medical treatments, surgical interventions and non-pharmacological approaches. I explained in particular the risks and ramifications of untreated moderate to severe OSA, especially with respect to developing cardiovascular disease down the Road, including congestive heart failure, difficult to treat hypertension, cardiac arrhythmias, or stroke. Even type 2 diabetes has, in part, been linked to untreated OSA. Symptoms of untreated OSA include daytime sleepiness, memory problems, mood irritability and mood disorder such as depression and anxiety, lack of energy, as well as recurrent headaches, especially morning headaches. We talked about smoking cessation and trying to maintain a healthy lifestyle in general, as well as the importance of weight control. I encouraged the patient to eat  healthy, exercise daily and keep well hydrated, to keep a scheduled bedtime and wake time routine, to not skip any meals and eat healthy snacks in between meals. I advised the patient not to drive when feeling sleepy. I recommended the following at this time: sleep study with potential positive airway pressure titration. (We will score hypopneas at 4% and split the sleep study into diagnostic and treatment portion, if the estimated. 2 hour AHI is >20/h).   I explained the sleep test procedure to the patient and also outlined possible surgical and non-surgical treatment options of OSA, including the use of a custom-made dental device (which would require a referral to a specialist dentist or oral surgeon), upper airway surgical options, such as pillar implants, radiofrequency surgery, tongue base surgery, and UPPP (which would  involve a referral to an ENT surgeon). Rarely, jaw surgery such as mandibular advancement may be considered.  I also explained the CPAP treatment option to the patient, who indicated that he would be willing to try CPAP if the need arises. I explained the importance of being compliant with PAP treatment, not only for insurance purposes but primarily to improve His symptoms, and for the patient's long term health benefit, including to reduce His cardiovascular risks. I answered all his questions today and the patient was in agreement. I would like to see him back after the sleep study is completed and encouraged him to call with any interim questions, concerns, problems or updates.   Thank you very much for allowing me to participate in the care of this nice patient. If I can be of any further assistance to you please do not hesitate to call me at [phone].  Sincerely,   Star Age, MD, PhD

## 2016-02-19 NOTE — Patient Instructions (Signed)

## 2016-02-23 ENCOUNTER — Telehealth: Payer: Self-pay | Admitting: Neurology

## 2016-02-23 NOTE — Telephone Encounter (Signed)
Randy Morgan with [employer] called in reference to an authorization for a sleep study.  Called requesting clinicals states she didn't received clinicals for patient.  Request was marked as (urgent) if additional clinicals are not received within an hr authorization has to be sent to review which will be denied due to no clinicals.  Case #- [id number].

## 2016-02-27 ENCOUNTER — Encounter: Payer: Self-pay | Admitting: Physician Assistant

## 2016-02-27 ENCOUNTER — Ambulatory Visit (INDEPENDENT_AMBULATORY_CARE_PROVIDER_SITE_OTHER): Payer: 59 | Admitting: Physician Assistant

## 2016-02-27 VITALS — BP 130/92 | HR 101 | Temp 98.6°F | Resp 18 | Ht 66.0 in | Wt 283.4 lb

## 2016-02-27 DIAGNOSIS — R0981 Nasal congestion: Secondary | ICD-10-CM

## 2016-02-27 DIAGNOSIS — F17200 Nicotine dependence, unspecified, uncomplicated: Secondary | ICD-10-CM

## 2016-02-27 DIAGNOSIS — I10 Essential (primary) hypertension: Secondary | ICD-10-CM | POA: Diagnosis not present

## 2016-02-27 MED ORDER — IPRATROPIUM BROMIDE 0.03 % NA SOLN: 2.0000 | Freq: Two times a day (BID) | NASAL | 0 refills | Status: DC

## 2016-02-27 NOTE — Progress Notes (Signed)
Patient ID: Randy Morgan, male    DOB: [birth year], 51 y.o.   MRN: [id number]  PCP: Harrison Mons, PA-C  Chief Complaint  Patient presents with  . Follow-up    Blood Pressure     Subjective:   Presents for evaluation of HTN.  Taking lisinopril-HCTZ 10-12.5, 2 tablets each day. Tolerating this dose well. Home BP was 138/98 at home last week. Had a piece of fried chicken today at lunch. Has reduced/eliminated bread, pasta, sodas and fries.  "I snuck a burger the other day and it felt awful!" Eating more salads, chicken and fish. Has cut back cigarettes to about 5/day.  No more ankle swelling. Increased energy. Mild back pain. Doing Zumba with his wife!  Wasn't able to get the azelastine NS, as it was >$100. Continues to be very congested.  Saw Dr. Rexene Alberts for suspected OSA. Waiting for insurance authorization to proceed with sleep study.  Waiting for Dr. Biagio Borg office to contact him regarding the surgery for his LEFT arm.  Colonoscopy also pending. He hasn't received a call to schedule, but referral notes indicate that he was contacted on 01/31/2016.  Review of Systems No CP, SOB, HA, dizziness. No nausea, vomiting or diarrhea.    Patient Active Problem List   Diagnosis Date Noted  . Left arm weakness 12/19/2015  . BMI 45.0-49.9, adult ([place]) 12/15/2015  . Essential hypertension, benign 12/15/2015  . Smoker 12/15/2015     Prior to Admission medications   Medication Sig Start Date End Date Taking? Authorizing Provider  albuterol (PROVENTIL HFA;VENTOLIN HFA) 108 (90 Base) MCG/ACT inhaler Inhale 2 puffs into the lungs every 4 (four) hours as needed for wheezing or shortness of breath (cough, shortness of breath or wheezing.). 01/30/16  Yes Kamareon Sciandra, PA-C  benzonatate (TESSALON) 100 MG capsule Take 1-2 capsules (100-200 mg total) by mouth 3 (three) times daily as needed for cough. 01/30/16  Yes Starlina Lapre, PA-C  lisinopril-hydrochlorothiazide  (PRINZIDE,ZESTORETIC) 10-12.5 MG tablet Take 2 tablets by mouth daily. 01/30/16  Yes Donne Baley, PA-C  meloxicam (MOBIC) 15 MG tablet Take 1 tablet (15 mg total) by mouth daily. 12/15/15  Yes Quenesha Douglass, PA-C  Azelastine HCl 0.15 % SOLN Place 2 sprays into both nostrils 2 (two) times daily. Patient not taking: Reported on 02/27/2016 01/30/16   Harrison Mons, PA-C     No Known Allergies     Objective:  Physical Exam  Constitutional: He is oriented to person, place, and time. He appears well-developed and well-nourished. He is active and cooperative. No distress.  BP (!) 130/92   Pulse (!) 101   Temp 98.6 F (37 C) (Oral)   Resp 18   Ht 5\' 6"  (1.676 m)   Wt 283 lb 6.4 oz (128.5 kg)   SpO2 94%   BMI 45.74 kg/m   HENT:  Head: Normocephalic and atraumatic.  Right Ear: Hearing normal.  Left Ear: Hearing normal.  Eyes: Conjunctivae are normal. No scleral icterus.  Neck: Normal range of motion. Neck supple. No thyromegaly present.  Cardiovascular: Normal rate, regular rhythm and normal heart sounds.   Pulses:      Radial pulses are 2+ on the right side, and 2+ on the left side.  Pulmonary/Chest: Effort normal and breath sounds normal.  Lymphadenopathy:       Head (right side): No tonsillar, no preauricular, no posterior auricular and no occipital adenopathy present.       Head (left side): No tonsillar, no preauricular, no posterior auricular  and no occipital adenopathy present.    He has no cervical adenopathy.       Right: No supraclavicular adenopathy present.       Left: No supraclavicular adenopathy present.  Neurological: He is alert and oriented to person, place, and time. No sensory deficit.  Skin: Skin is warm, dry and intact. No rash noted. No cyanosis or erythema. Nails show no clubbing.  Psychiatric: He has a normal mood and affect. His speech is normal and behavior is normal.    Wt Readings from Last 3 Encounters:  02/27/16 288 lb (130.6 kg)  02/19/16 292 lb  (132.5 kg)  01/30/16 297 lb (134.7 kg)  of note, he was wearing work boots for all these readings. Repeat weight without boots today 283.5 lbs.  Repeat BP 130/92.     Assessment & Plan:   1. Essential hypertension, benign Improved, not yet to goal. Continue efforts for healthy eating, exercise and smoking cessation. Proceed with sleep study. Continue current regimen. Re-evaluate in 4 weeks. Will add CCB at that time if BP remains >140/90. - Care order/instruction:  2. Nasal congestion Try Atrovent. If still too expensive would try a steroid NS, but was trying to get him more immediate relief. - ipratropium (ATROVENT) 0.03 % nasal spray; Place 2 sprays into both nostrils 2 (two) times daily.  Dispense: 30 mL; Refill: 0  3. Smoker Counseled to continue efforts to reduce smoking.   Fara Chute, PA-C Physician Assistant-Certified [facility] at [place]

## 2016-02-27 NOTE — Patient Instructions (Addendum)
Call Dr. Biagio Borg office [phone] regarding your arm. Call [facility] [phone] to schedule a visit.  If the NEW nasal spray is too expensive, or doesn't work, let me know.  Keep up the great work with healthier eating and increased exercise! Keep working on cutting back on the cigarettes.    IF you received an x-ray today, you will receive an invoice from [facility]. Please contact [facility] at [phone] with questions or concerns regarding your invoice.   IF you received labwork today, you will receive an invoice from [facility]. Please contact [facility] at [phone] with questions or concerns regarding your invoice.   Our billing staff will not be able to assist you with questions regarding bills from these companies.  You will be contacted with the lab results as soon as they are available. The fastest way to get your results is to activate your My Chart account. Instructions are located on the last page of this paperwork. If you have not heard from [place] regarding the results in 2 weeks, please contact this office.

## 2016-02-27 NOTE — Addendum Note (Signed)
Addended by: Fara Chute on: 02/27/2016 04:35 PM   Modules accepted: Orders

## 2016-03-04 ENCOUNTER — Ambulatory Visit: Payer: 59 | Attending: Physician Assistant

## 2016-03-26 ENCOUNTER — Ambulatory Visit: Payer: Self-pay | Admitting: Physician Assistant

## 2016-04-01 ENCOUNTER — Encounter: Payer: Self-pay | Admitting: Physician Assistant

## 2016-04-19 ENCOUNTER — Telehealth: Payer: Self-pay | Admitting: Physician Assistant

## 2016-04-19 NOTE — Telephone Encounter (Signed)
3 month supply, RF x 3 sent 01/30/2016. Advised that the patient just needs to contact the pharmacy that he needs a refill.

## 2016-05-21 ENCOUNTER — Ambulatory Visit: Payer: Self-pay | Admitting: Physician Assistant

## 2016-06-17 ENCOUNTER — Encounter (HOSPITAL_COMMUNITY): Payer: Self-pay | Admitting: Emergency Medicine

## 2016-06-17 ENCOUNTER — Emergency Department (HOSPITAL_COMMUNITY)
Admission: EM | Admit: 2016-06-17 | Discharge: 2016-06-17 | Disposition: A | Discharge status: Home / Self Care | Payer: Worker's Compensation | Attending: Emergency Medicine | Admitting: Emergency Medicine

## 2016-06-17 ENCOUNTER — Emergency Department (HOSPITAL_COMMUNITY): Payer: Worker's Compensation

## 2016-06-17 DIAGNOSIS — I10 Essential (primary) hypertension: Secondary | ICD-10-CM | POA: Insufficient documentation

## 2016-06-17 DIAGNOSIS — Y999 Unspecified external cause status: Secondary | ICD-10-CM | POA: Insufficient documentation

## 2016-06-17 DIAGNOSIS — S61411A Laceration without foreign body of right hand, initial encounter: Secondary | ICD-10-CM

## 2016-06-17 DIAGNOSIS — W268XXA Contact with other sharp object(s), not elsewhere classified, initial encounter: Secondary | ICD-10-CM | POA: Insufficient documentation

## 2016-06-17 DIAGNOSIS — Y929 Unspecified place or not applicable: Secondary | ICD-10-CM | POA: Insufficient documentation

## 2016-06-17 DIAGNOSIS — Y9389 Activity, other specified: Secondary | ICD-10-CM | POA: Diagnosis not present

## 2016-06-17 DIAGNOSIS — Z79899 Other long term (current) drug therapy: Secondary | ICD-10-CM | POA: Diagnosis not present

## 2016-06-17 DIAGNOSIS — F17210 Nicotine dependence, cigarettes, uncomplicated: Secondary | ICD-10-CM | POA: Insufficient documentation

## 2016-06-17 IMAGING — CR DG HAND COMPLETE 3+V*R*
3 series · 3 of 3 positions shown · non-contrast
Comparison: None.

CLINICAL DATA: 51 y/o M; mass on the lateral side of forearm.
Finger numbness. Soft tissue injury to palm.

EXAM:
RIGHT HAND - COMPLETE 3+ VIEW

[hand pa]
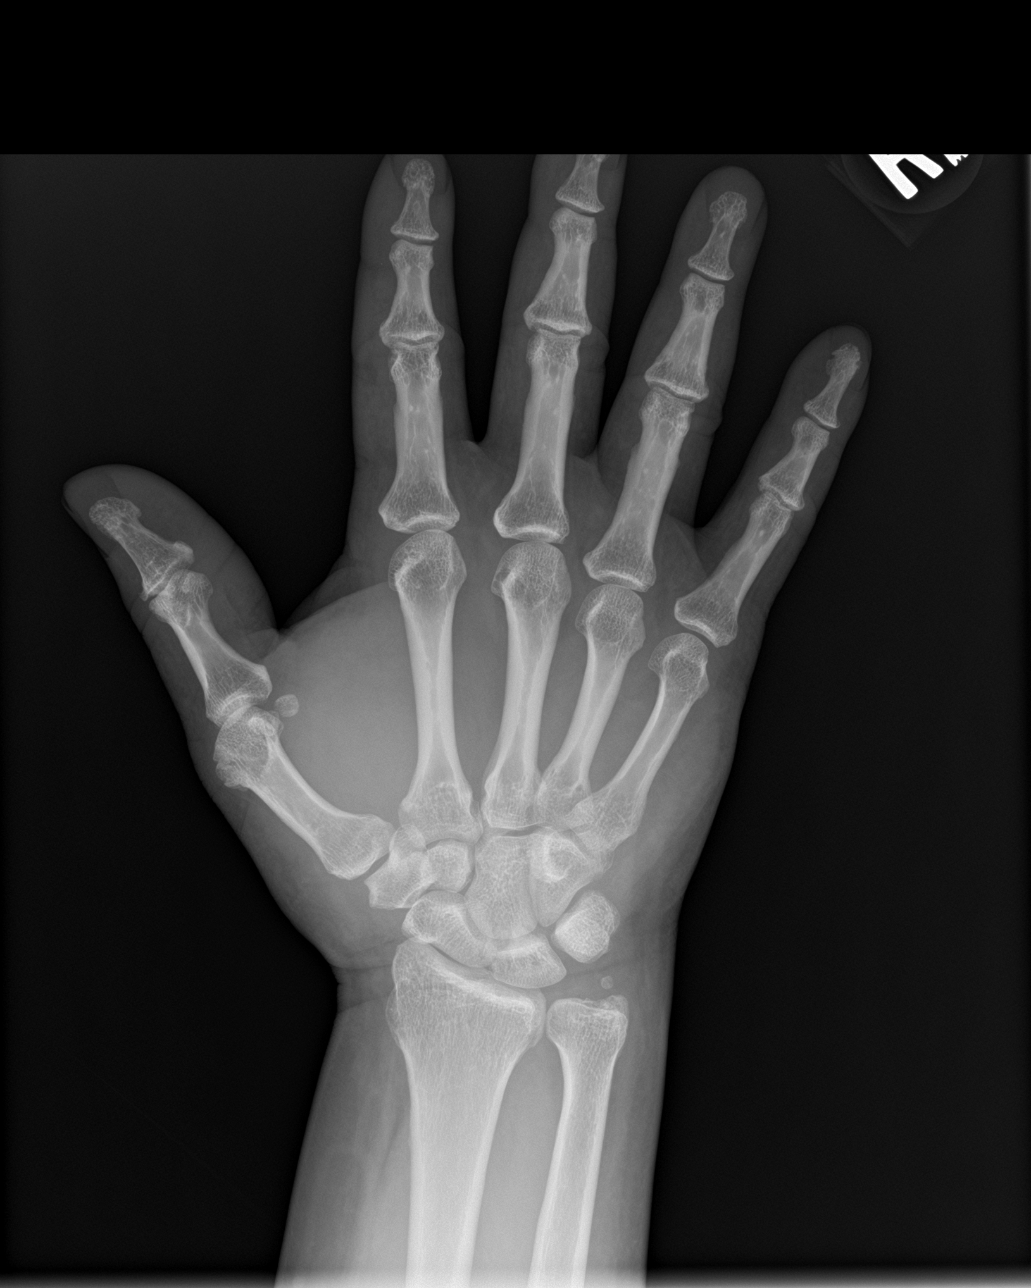

[hand obl]
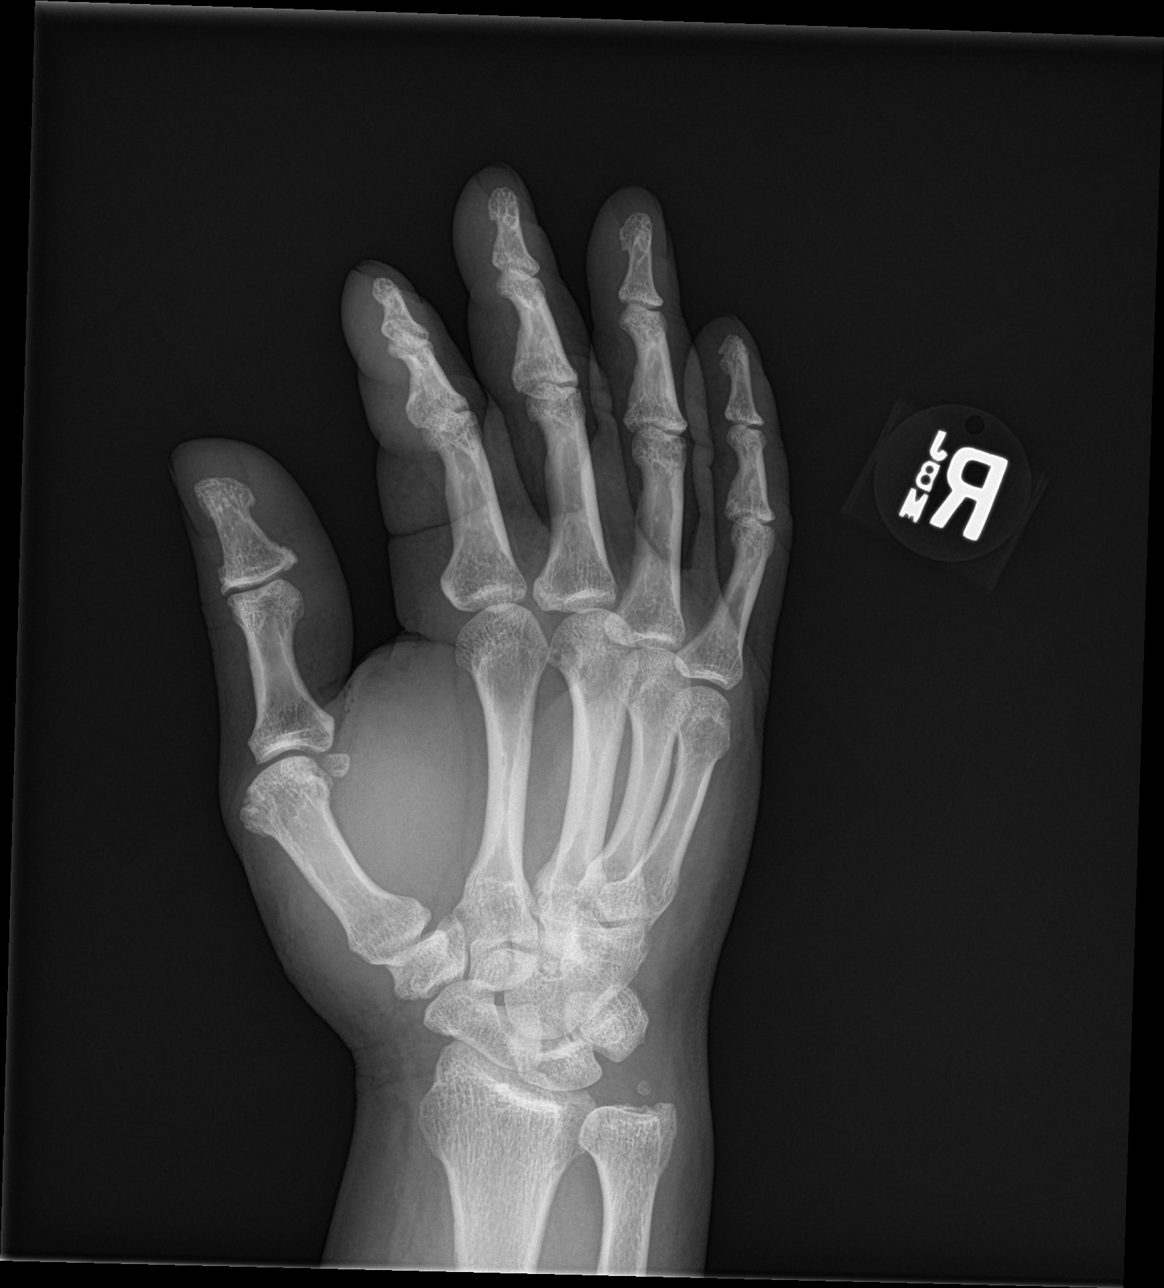

[hand lat]
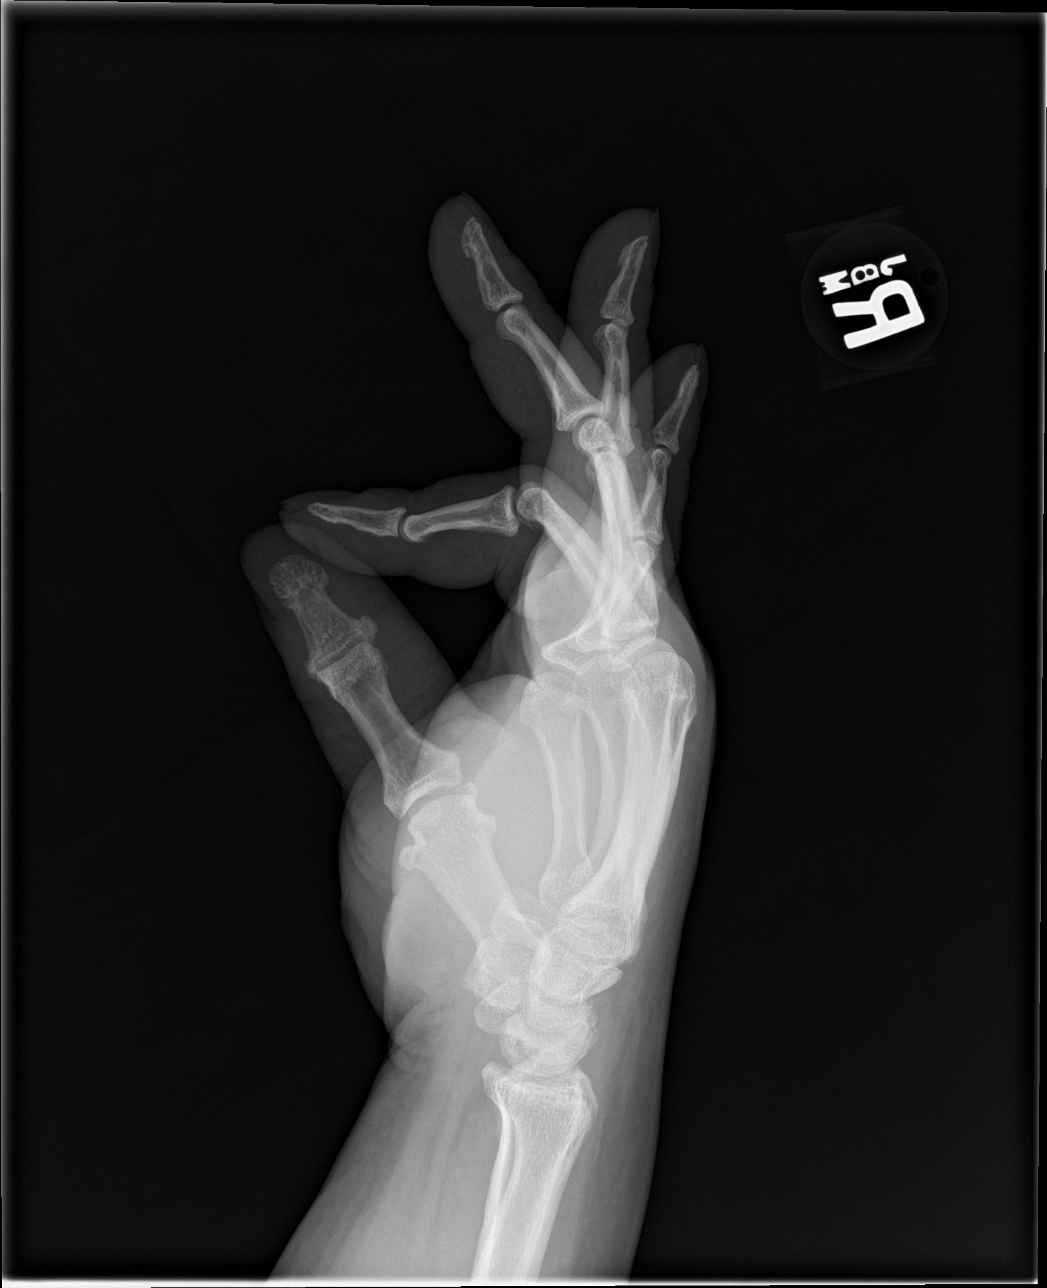

[3 of 3 positions shown; findings below may reference images not displayed]

FINDINGS: There is no evidence of acute fracture or dislocation. Chronic ulnar
styloid fracture. Osteoarthrosis of the first interphalangeal joint
with periarticular osteophytes.
IMPRESSION: No acute bony or articular abnormality identified. Mild first
interphalangeal joint osteoarthrosis and chronic ulnar styloid
fracture.

By: TIGER M.D.

## 2016-06-17 IMAGING — CR DG FOREARM 2V*L*
2 series · 2 of 2 positions shown · non-contrast
Comparison: None.

CLINICAL DATA: Left forearm mass.

EXAM:
LEFT FOREARM - 2 VIEW

[forearm ap]
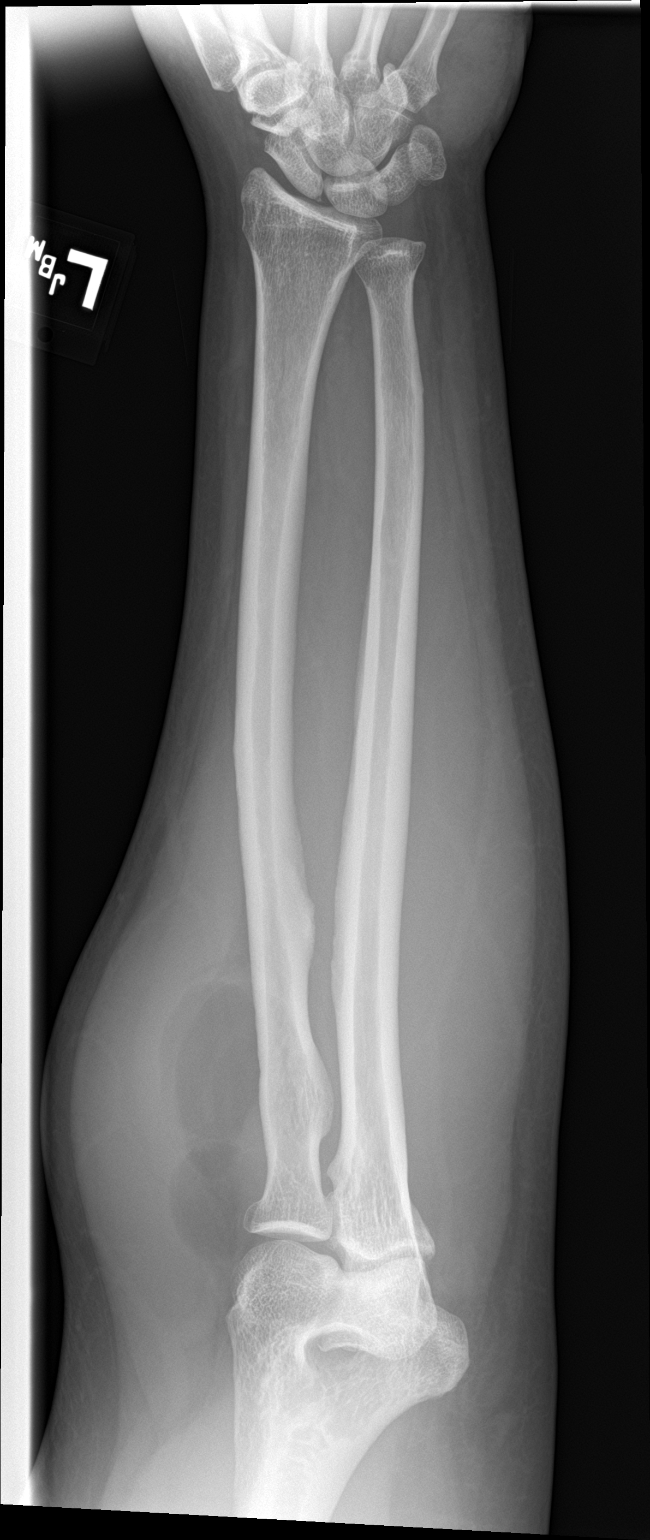

[forearm lat]
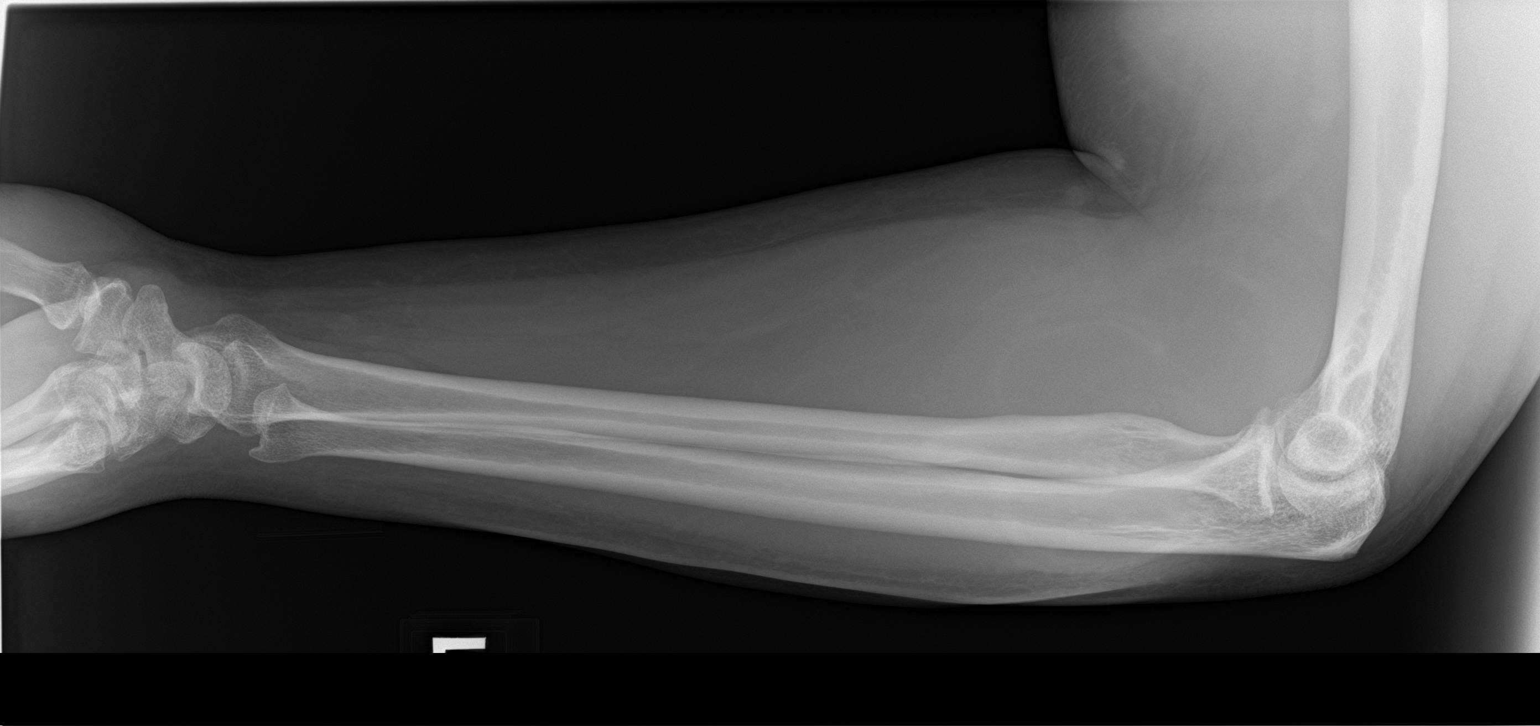

[2 of 2 positions shown; findings below may reference images not displayed]

FINDINGS: There is no evidence of fracture or other focal bone lesions. There
is a bilobed mass that appears to be of low density underlying the
musculature of the proximal left forearm.
IMPRESSION: No fracture or dislocation is noted. Bilobed mass seen underlying
musculature of proximal left forearm which may represent a lipoma,
cyst or neoplasm. Further evaluation with ultrasound or MRI is
recommended.

## 2016-06-17 MED ORDER — LIDOCAINE HCL (PF) 1 % IJ SOLN
5.0000 mL | Freq: Once | INTRAMUSCULAR | Status: AC
  Administered 2016-06-17: 5 mL
  Filled 2016-06-17: qty 5

## 2016-06-17 MED ORDER — LIDOCAINE HCL (PF) 1 % IJ SOLN
5.0000 mL | Freq: Once | INTRAMUSCULAR | Status: AC
  Administered 2016-06-17: 5 mL

## 2016-06-17 MED ORDER — LIDOCAINE HCL (PF) 1 % IJ SOLN
INTRAMUSCULAR | Status: AC
  Administered 2016-06-17: 5 mL
  Filled 2016-06-17: qty 5

## 2016-06-17 MED ORDER — OXYCODONE-ACETAMINOPHEN 5-325 MG PO TABS: 1.0000 | ORAL_TABLET | Freq: Once | ORAL | Status: DC

## 2016-06-17 MED ORDER — OXYCODONE-ACETAMINOPHEN 5-325 MG PO TABS
1.0000 | ORAL_TABLET | Freq: Once | ORAL | Status: AC
  Administered 2016-06-17: 1 via ORAL
  Filled 2016-06-17: qty 1

## 2016-06-17 NOTE — ED Notes (Signed)
ED Provider at bedside. 

## 2016-06-17 NOTE — ED Triage Notes (Signed)
Pt here with laceration to right hand from grinding wheel; bleeding controlled

## 2016-06-17 NOTE — ED Notes (Signed)
Pt states he always has high BP instructed to keep a close eye and follow up to control BP, pt also in pain and states the pain usually make his BP higher as well. Pt wife driving home due to narcotic given for pain

## 2016-06-17 NOTE — ED Provider Notes (Signed)
[facility] DEPT Provider Note   CSN: [id number] Arrival date & time: 06/17/16  1251  By signing my name below, I, Randy Morgan, attest that this documentation has been prepared under the direction and in the presence of Davonna Belling, MD. Electronically Signed: Ludger Nutting, Scribe. 06/17/16. 2:20 PM.  History   Chief Complaint Chief Complaint  Patient presents with  . Extremity Laceration    The history is provided by the patient. No language interpreter was used.     HPI Comments: Randy Morgan is a 51 y.o. male who presents to the Emergency Department complaining of a laceration to the right palm that occurred PTA. Patient states he was using a grinding wheel when the blade shattered, striking the affected area. He reports associated pain to the same area. He denies any other injuries at this time. His tetanus is UTD.   He also complains of a knot to the left upper forearm after bumping it on something 3 months ago. Since then he has trouble with movement of the left fingers and numbness/tingling to the same. He is able to move the extremity at the elbow and wrist without difficulty.   Past Medical History:  Diagnosis Date  . Hypertension     Patient Active Problem List   Diagnosis Date Noted  . Left arm weakness 12/19/2015  . BMI 45.0-49.9, adult ([facility]) 12/15/2015  . Essential hypertension, benign 12/15/2015  . Smoker 12/15/2015    Past Surgical History:  Procedure Laterality Date  . NO PAST SURGERIES         Home Medications    Prior to Admission medications   Medication Sig Start Date End Date Taking? Authorizing Provider  albuterol (PROVENTIL HFA;VENTOLIN HFA) 108 (90 Base) MCG/ACT inhaler Inhale 2 puffs into the lungs every 4 (four) hours as needed for wheezing or shortness of breath (cough, shortness of breath or wheezing.). 01/30/16   Harrison Mons, PA-C  benzonatate (TESSALON) 100 MG capsule Take 1-2 capsules (100-200 mg total) by mouth 3 (three)  times daily as needed for cough. 01/30/16   Jeffery, Chelle, PA-C  ipratropium (ATROVENT) 0.03 % nasal spray Place 2 sprays into both nostrils 2 (two) times daily. 02/27/16   Harrison Mons, PA-C  lisinopril-hydrochlorothiazide (PRINZIDE,ZESTORETIC) 10-12.5 MG tablet Take 2 tablets by mouth daily. 01/30/16   Harrison Mons, PA-C  meloxicam (MOBIC) 15 MG tablet Take 1 tablet (15 mg total) by mouth daily. 12/15/15   Harrison Mons, PA-C    Family History Family History  Problem Relation Age of Onset  . Heart disease Mother   . Diabetes Mother   . Asthma Mother   . Heart disease Father   . Asthma Sister   . Heart disease Brother   . Heart attack Brother     Social History Social History  Substance Use Topics  . Smoking status: Current Some Day Smoker    Packs/day: 0.50    Years: 25.00    Types: Cigarettes  . Smokeless tobacco: Never Used  . Alcohol use 1.2 oz/week    2 Cans of beer per week     Allergies   Patient has no known allergies.   Review of Systems Review of Systems  Skin: Positive for wound.  Neurological: Positive for weakness.     Physical Exam Updated Vital Signs BP (!) 171/125 (BP Location: Left Arm)   Pulse 92   Temp 98.7 F (37.1 C) (Oral)   Resp 20   SpO2 95%   Physical Exam  Constitutional: He is  oriented to person, place, and time. He appears well-developed and well-nourished.  HENT:  Head: Normocephalic and atraumatic.  Eyes: EOM are normal.  Neck: Normal range of motion.  Cardiovascular: Normal rate, regular rhythm, normal heart sounds and intact distal pulses.   Pulmonary/Chest: Effort normal and breath sounds normal. No respiratory distress.  Abdominal: Soft. He exhibits no distension. There is no tenderness.  Musculoskeletal: Normal range of motion.  RUE: 3 cm somewhat irregular laceration over the thenar eminence on the right palm. Sensation grossly intact. Full extension and flexion of the fingers. Flexion and opposition of thumb  intact. Sensation in all 5 fingers. Good flexion and extension of the wrist.  LUE: Left forearm on the radial aspect proximally has a swollen soft tissue mass. Decreased extension to the 2nd, 3rd, and 4th. Good flexion of fingers. Good flexion and extension of the wrist.   Neurological: He is alert and oriented to person, place, and time.  Skin: Skin is warm and dry. Laceration noted.  Psychiatric: He has a normal mood and affect. Judgment normal.  Nursing note and vitals reviewed.    ED Treatments / Results     COORDINATION OF CARE: 2:11 PM Discussed treatment plan with pt at bedside and pt agreed to plan.   Labs (all labs ordered are listed, but only abnormal results are displayed) Labs Reviewed - No data to display  EKG  EKG Interpretation None       Radiology Dg Forearm Left  Result Date: 06/17/2016 CLINICAL DATA:  Left forearm mass. EXAM: LEFT FOREARM - 2 VIEW COMPARISON:  None. FINDINGS: There is no evidence of fracture or other focal bone lesions. There is a bilobed mass that appears to be of low density underlying the musculature of the proximal left forearm. IMPRESSION: No fracture or dislocation is noted. Bilobed mass seen underlying musculature of proximal left forearm which may represent a lipoma, cyst or neoplasm. Further evaluation with ultrasound or MRI is recommended. Electronically Signed   By: Marijo Conception, M.D.   On: 06/17/2016 15:33   Dg Hand Complete Right  Result Date: 06/17/2016 CLINICAL DATA:  51 y/o M; mass on the lateral side of forearm. Finger numbness. Soft tissue injury to palm. EXAM: RIGHT HAND - COMPLETE 3+ VIEW COMPARISON:  None. FINDINGS: There is no evidence of acute fracture or dislocation. Chronic ulnar 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 with periarticular osteophytes. IMPRESSION: No acute bony or articular abnormality identified. 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  Electronically Signed   By: Kristine Garbe M.D.   On: 06/17/2016 15:42    Procedures Procedures (including critical care time)  Medications Ordered in ED Medications  lidocaine (PF) (XYLOCAINE) 1 % injection 5 mL (5 mLs Infiltration Given 06/17/16 1434)  lidocaine (PF) (XYLOCAINE) 1 % injection 5 mL (5 mLs Infiltration Given 06/17/16 1602)  oxyCODONE-acetaminophen (PERCOCET/ROXICET) 5-325 MG per tablet 1 tablet (1 tablet Oral Given 06/17/16 1600)     Initial Impression / Assessment and Plan / ED Course  I have reviewed the triage vital signs and the nursing notes.  Pertinent labs & imaging results that were available during my care of the patient were reviewed by me and considered in my medical decision making (see chart for details).     Patient with right hand laceration.). No foreign bodies seen or on x-ray. Also has mass in left forearm. States he hadn't been seen for a mechanical months ago however just under a year ago  there was an ultrasound done. Will need to follow with hand surgeon now it is becoming more symptomatic. Sutures out in 7 days.  LACERATION REPAIR Performed by: Mackie Pai Authorized by: Mackie Pai Consent: Verbal consent obtained. Risks and benefits: risks, benefits and alternatives were discussed Consent given by: patient Patient identity confirmed: provided demographic data Prepped and Draped in normal sterile fashion Wound explored  Laceration Location: right hand  Laceration Length: 3cm  No Foreign Bodies seen or palpated  Anesthesia: local infiltration  Local anesthetic: lidocaine 1%   Anesthetic total: 10 ml  Irrigation method: syringe Amount of cleaning: moderate  Skin closure: 4-0 vicryl rapide  Number of sutures: 8  Technique: simple interupted  Patient tolerance: Patient tolerated the procedure well with no immediate complications.   Final Clinical Impressions(s) / ED Diagnoses   Final diagnoses:    Laceration of right hand without foreign body, initial encounter    New Prescriptions New Prescriptions   No medications on file   I personally performed the services described in this documentation, which was scribed in my presence. The recorded information has been reviewed and is accurate.      Davonna Belling, MD 06/17/16 1620

## 2016-06-17 NOTE — Discharge Instructions (Signed)
Sutures out in 1 week. °

## 2016-06-25 ENCOUNTER — Encounter (HOSPITAL_COMMUNITY): Payer: Self-pay

## 2016-06-25 ENCOUNTER — Emergency Department (HOSPITAL_COMMUNITY)
Admission: EM | Admit: 2016-06-25 | Discharge: 2016-06-25 | Disposition: A | Discharge status: Home / Self Care | Payer: Worker's Compensation | Attending: Emergency Medicine | Admitting: Emergency Medicine

## 2016-06-25 DIAGNOSIS — Z4802 Encounter for removal of sutures: Secondary | ICD-10-CM | POA: Insufficient documentation

## 2016-06-25 DIAGNOSIS — I10 Essential (primary) hypertension: Secondary | ICD-10-CM | POA: Insufficient documentation

## 2016-06-25 DIAGNOSIS — F17210 Nicotine dependence, cigarettes, uncomplicated: Secondary | ICD-10-CM | POA: Insufficient documentation

## 2016-06-25 DIAGNOSIS — Z79899 Other long term (current) drug therapy: Secondary | ICD-10-CM | POA: Diagnosis not present

## 2016-06-25 NOTE — ED Triage Notes (Signed)
Per Pt, Pt is coming from home with reports of tenderness to the right hand after sutures placed last week. Pt was to go back to work today, but reports discomfort in the hand. No discharge or erythema noted at the site.

## 2016-06-25 NOTE — ED Provider Notes (Signed)
[facility] DEPT Provider Note   CSN: [id number] Arrival date & time: 06/25/16  0932  By signing my name below, I, Marcello Moores, attest that this documentation has been prepared under the direction and in the presence of Quincy Carnes, PA-C Electronically Signed: Marcello Moores, ED Scribe. 06/25/16. 10:10 AM.  History   Chief Complaint Chief Complaint  Patient presents with  . Wound Check   The history is provided by the patient. No language interpreter was used.   HPI Comments: Randy Morgan is a 51 y.o. male who presents to the Emergency Department complaining of tenderness and swelling to an (8) stitched wound to the right hand that he was treated for on 06/17/2016. He reports that he has cleaned the area with peroxide and neosporin with mild relief. The pt has a h/x of HTN. He also denies additional injuries.  States he was not told when to get stitches out but states the area feels very "tight".  Past Medical History:  Diagnosis Date  . Hypertension     Patient Active Problem List   Diagnosis Date Noted  . Left arm weakness 12/19/2015  . BMI 45.0-49.9, adult ([place]) 12/15/2015  . Essential hypertension, benign 12/15/2015  . Smoker 12/15/2015    Past Surgical History:  Procedure Laterality Date  . NO PAST SURGERIES         Home Medications    Prior to Admission medications   Medication Sig Start Date End Date Taking? Authorizing Provider  albuterol (PROVENTIL HFA;VENTOLIN HFA) 108 (90 Base) MCG/ACT inhaler Inhale 2 puffs into the lungs every 4 (four) hours as needed for wheezing or shortness of breath (cough, shortness of breath or wheezing.). 01/30/16   Harrison Mons, PA-C  benzonatate (TESSALON) 100 MG capsule Take 1-2 capsules (100-200 mg total) by mouth 3 (three) times daily as needed for cough. 01/30/16   Jeffery, Chelle, PA-C  ipratropium (ATROVENT) 0.03 % nasal spray Place 2 sprays into both nostrils 2 (two) times daily. 02/27/16   Harrison Mons, PA-C    lisinopril-hydrochlorothiazide (PRINZIDE,ZESTORETIC) 10-12.5 MG tablet Take 2 tablets by mouth daily. 01/30/16   Harrison Mons, PA-C  meloxicam (MOBIC) 15 MG tablet Take 1 tablet (15 mg total) by mouth daily. 12/15/15   Harrison Mons, PA-C    Family History Family History  Problem Relation Age of Onset  . Heart disease Mother   . Diabetes Mother   . Asthma Mother   . Heart disease Father   . Asthma Sister   . Heart disease Brother   . Heart attack Brother     Social History Social History  Substance Use Topics  . Smoking status: Current Some Day Smoker    Packs/day: 0.50    Years: 25.00    Types: Cigarettes  . Smokeless tobacco: Never Used  . Alcohol use 1.2 oz/week    2 Cans of beer per week     Allergies   Patient has no known allergies.   Review of Systems Review of Systems  Skin: Positive for wound.  All other systems reviewed and are negative.    Physical Exam Updated Vital Signs BP (!) 167/110 (BP Location: Left Arm)   Pulse 93   Temp 98.3 F (36.8 C) (Oral)   Resp 18   Ht 5' 6.5" (1.689 m)   Wt 275 lb (124.7 kg)   SpO2 94%   BMI 43.72 kg/m   Physical Exam  Constitutional: He is oriented to person, place, and time. He appears well-developed and well-nourished.  HENT:  Head: Normocephalic and atraumatic.  Mouth/Throat: Oropharynx is clear and moist.  Eyes: Conjunctivae and EOM are normal. Pupils are equal, round, and reactive to light.  Neck: Normal range of motion.  Cardiovascular: Normal rate, regular rhythm and normal heart sounds.   Pulmonary/Chest: Effort normal and breath sounds normal.  Abdominal: Soft. Bowel sounds are normal.  Musculoskeletal: Normal range of motion.  8 vicryl rapide sutures intact to left palm along thenar eminence; there is some scabbing but no drainage or signs of infection noted; full ROM of all fingers; normal sensation throughout  Neurological: He is alert and oriented to person, place, and time.  Skin: Skin is  warm and dry.  Psychiatric: He has a normal mood and affect.  Nursing note and vitals reviewed.    ED Treatments / Results   DIAGNOSTIC STUDIES: Oxygen Saturation is 94% on RA, low by my interpretation.   COORDINATION OF CARE: 10:10 AM-Discussed next steps with pt. Pt verbalized understanding and is agreeable with the plan.   Labs (all labs ordered are listed, but only abnormal results are displayed) Labs Reviewed - No data to display  EKG  EKG Interpretation None       Radiology No results found.  Procedures .Suture Removal Date/Time: 06/25/2016 10:12 AM Performed by: Davonna Belling Authorized by: Davonna Belling   Consent:    Consent obtained:  Verbal   Consent given by:  Patient   Risks discussed:  Bleeding   Alternatives discussed:  Delayed treatment Universal protocol:    Procedure explained and questions answered to patient or proxy's satisfaction: yes     Relevant documents present and verified: yes     Test results available and properly labeled: yes     Imaging studies available: yes     Patient identity confirmed:  Verbally with patient Location:    Location:  Upper extremity   Upper extremity location:  Hand   Hand location:  R hand Procedure details:    Wound appearance:  No signs of infection   Number of sutures removed:  8 Post-procedure details:    Post-removal:  Dressing applied   Patient tolerance of procedure:  Tolerated well, no immediate complications    (including critical care time)  Medications Ordered in ED Medications - No data to display   Initial Impression / Assessment and Plan / ED Course  I have reviewed the triage vital signs and the nursing notes.  Pertinent labs & imaging results that were available during my care of the patient were reviewed by me and considered in my medical decision making (see chart for details).  51 year old male here with wound check of right palm. Had sutures placed here about a days  ago. States area has been feeling "tight". On exam he has 8 intact vicryl Rapide sutures at the base of the right thumb along the thenar eminence. There is some scabbing but no drainage or signs of superimposed infection. Sutures were removed without difficulty. Patient is able to range his thumb without issue. Hand remains neurovascularly intact. Discussed home wound care. Feel he is stable to return to work at this time.  Discussed plan with patient, he acknowledged understanding and agreed with plan of care.  Return precautions given for new or worsening symptoms.  Final Clinical Impressions(s) / ED Diagnoses   Final diagnoses:  Visit for suture removal    New Prescriptions Discharge Medication List as of 06/25/2016 10:25 AM     I personally performed the services described in this documentation,  which was scribed in my presence. The recorded information has been reviewed and is accurate.     Larene Pickett, PA-C 06/25/16 Brunson, Nathan, MD 06/25/16 [phone]

## 2016-06-25 NOTE — ED Notes (Signed)
ED Provider at bedside. 

## 2016-06-25 NOTE — Discharge Instructions (Signed)
Keep area clean with soap and warm water.  Can keep using neosporin for now. Can use mederma later on to help with scarring. Follow-up with your primary care doctor. Return here for new concerns.

## 2017-05-05 ENCOUNTER — Encounter: Payer: Self-pay | Admitting: Physician Assistant

## 2017-12-06 ENCOUNTER — Emergency Department (HOSPITAL_COMMUNITY): Payer: Self-pay

## 2017-12-06 ENCOUNTER — Encounter (HOSPITAL_COMMUNITY): Payer: Self-pay | Admitting: *Deleted

## 2017-12-06 ENCOUNTER — Other Ambulatory Visit: Payer: Self-pay

## 2017-12-06 ENCOUNTER — Inpatient Hospital Stay (HOSPITAL_COMMUNITY)
Admission: EM | Admit: 2017-12-06 | Discharge: 2017-12-09 | DRG: 189 | Disposition: A | Discharge status: Home Health | Payer: Self-pay | Attending: Internal Medicine | Admitting: Internal Medicine

## 2017-12-06 DIAGNOSIS — Z6841 Body Mass Index (BMI) 40.0 and over, adult: Secondary | ICD-10-CM

## 2017-12-06 DIAGNOSIS — R29818 Other symptoms and signs involving the nervous system: Secondary | ICD-10-CM

## 2017-12-06 DIAGNOSIS — Z9119 Patient's noncompliance with other medical treatment and regimen: Secondary | ICD-10-CM

## 2017-12-06 DIAGNOSIS — R0602 Shortness of breath: Secondary | ICD-10-CM

## 2017-12-06 DIAGNOSIS — R0902 Hypoxemia: Secondary | ICD-10-CM

## 2017-12-06 DIAGNOSIS — I5042 Chronic combined systolic (congestive) and diastolic (congestive) heart failure: Secondary | ICD-10-CM

## 2017-12-06 DIAGNOSIS — R7303 Prediabetes: Secondary | ICD-10-CM

## 2017-12-06 DIAGNOSIS — Q211 Atrial septal defect: Secondary | ICD-10-CM

## 2017-12-06 DIAGNOSIS — J9601 Acute respiratory failure with hypoxia: Principal | ICD-10-CM | POA: Diagnosis present

## 2017-12-06 DIAGNOSIS — R062 Wheezing: Secondary | ICD-10-CM

## 2017-12-06 DIAGNOSIS — Z79899 Other long term (current) drug therapy: Secondary | ICD-10-CM

## 2017-12-06 DIAGNOSIS — I5031 Acute diastolic (congestive) heart failure: Secondary | ICD-10-CM | POA: Diagnosis present

## 2017-12-06 DIAGNOSIS — I110 Hypertensive heart disease with heart failure: Secondary | ICD-10-CM | POA: Diagnosis present

## 2017-12-06 DIAGNOSIS — R739 Hyperglycemia, unspecified: Secondary | ICD-10-CM

## 2017-12-06 DIAGNOSIS — Z8249 Family history of ischemic heart disease and other diseases of the circulatory system: Secondary | ICD-10-CM

## 2017-12-06 DIAGNOSIS — R042 Hemoptysis: Secondary | ICD-10-CM | POA: Diagnosis present

## 2017-12-06 DIAGNOSIS — J9600 Acute respiratory failure, unspecified whether with hypoxia or hypercapnia: Secondary | ICD-10-CM | POA: Diagnosis present

## 2017-12-06 DIAGNOSIS — Z7982 Long term (current) use of aspirin: Secondary | ICD-10-CM

## 2017-12-06 DIAGNOSIS — F17210 Nicotine dependence, cigarettes, uncomplicated: Secondary | ICD-10-CM | POA: Diagnosis present

## 2017-12-06 DIAGNOSIS — F17201 Nicotine dependence, unspecified, in remission: Secondary | ICD-10-CM | POA: Diagnosis present

## 2017-12-06 DIAGNOSIS — F17200 Nicotine dependence, unspecified, uncomplicated: Secondary | ICD-10-CM | POA: Diagnosis present

## 2017-12-06 DIAGNOSIS — R0981 Nasal congestion: Secondary | ICD-10-CM

## 2017-12-06 DIAGNOSIS — I5022 Chronic systolic (congestive) heart failure: Secondary | ICD-10-CM

## 2017-12-06 DIAGNOSIS — I10 Essential (primary) hypertension: Secondary | ICD-10-CM | POA: Diagnosis present

## 2017-12-06 DIAGNOSIS — G4730 Sleep apnea, unspecified: Secondary | ICD-10-CM | POA: Diagnosis present

## 2017-12-06 LAB — COMPREHENSIVE METABOLIC PANEL
ALBUMIN: 4 g/dL (ref 3.5–5.0)
ALT: 17 U/L (ref 0–44)
AST: 20 U/L (ref 15–41)
Alkaline Phosphatase: 54 U/L (ref 38–126)
Anion gap: 9 (ref 5–15)
BILIRUBIN TOTAL: 0.6 mg/dL (ref 0.3–1.2)
BUN: 20 mg/dL (ref 6–20)
CHLORIDE: 101 mmol/L (ref 98–111)
CO2: 30 mmol/L (ref 22–32)
CREATININE: 1.25 mg/dL — AB (ref 0.61–1.24)
Calcium: 8.9 mg/dL (ref 8.9–10.3)
GFR calc Af Amer: >60 mL/min (ref >60)
GLUCOSE: 119 mg/dL — AB (ref 70–99)
Potassium: 3.6 mmol/L (ref 3.5–5.1)
Sodium: 140 mmol/L (ref 135–145)
TOTAL PROTEIN: 7.4 g/dL (ref 6.5–8.1)

## 2017-12-06 LAB — CBC
HEMATOCRIT: 43 % (ref 39.0–52.0)
Hemoglobin: 14.2 g/dL (ref 13.0–17.0)
MCH: 28.8 pg (ref 26.0–34.0)
MCHC: 33 g/dL (ref 30.0–36.0)
MCV: 87.2 fL (ref 80.0–100.0)
Platelets: 225 10*3/uL (ref 150–400)
RBC: 4.93 MIL/uL (ref 4.22–5.81)
RDW: 12.8 % (ref 11.5–15.5)
WBC: 6 10*3/uL (ref 4.0–10.5)
nRBC: 0 % (ref 0.0–0.2)

## 2017-12-06 LAB — I-STAT BETA HCG BLOOD, ED (MC, WL, AP ONLY)

## 2017-12-06 LAB — I-STAT TROPONIN, ED: Troponin i, poc: 0.01 ng/mL (ref 0.00–0.08)

## 2017-12-06 LAB — TYPE AND SCREEN
ABO/RH(D): A POS
Antibody Screen: NEGATIVE

## 2017-12-06 IMAGING — CT CT ANGIO CHEST
2 of 6 series · 17 of 46 positions shown · IV contrast (ISOVUE)
Comparison: Chest radiograph performed earlier today at [DATE] p.m.

CLINICAL DATA: Acute onset of dyspnea and cough. Angina.

EXAM:
CT ANGIOGRAPHY CHEST WITH CONTRAST
TECHNIQUE: Multidetector CT imaging of the chest was performed using the
standard protocol during bolus administration of intravenous
contrast. Multiplanar CT image reconstructions and MIPs were
obtained to evaluate the vascular anatomy.
CONTRAST:  100mL [95] IOPAMIDOL ([95]) INJECTION 76%

[Series 5: thins · axial · 0.78mm/px · z∈[+1923,+2190]mm · 14 of 293 slices shown]
[im 13/293  lung]
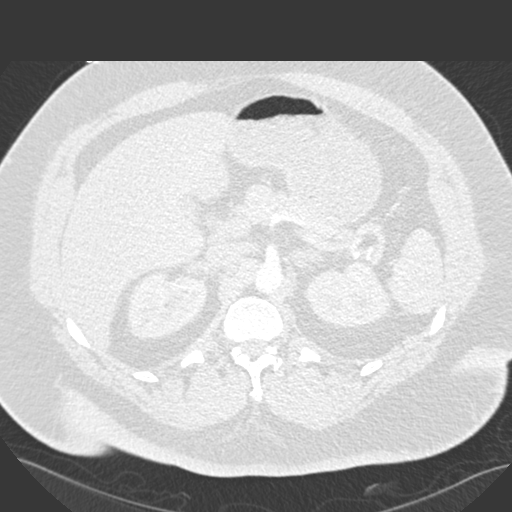
[im 39/293  soft-tissue]
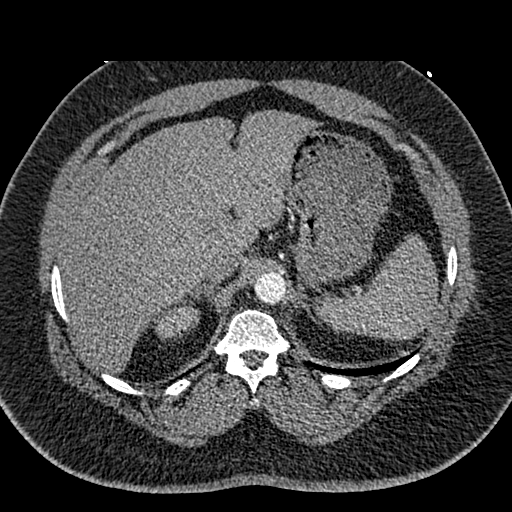
[im 51/293  lung]
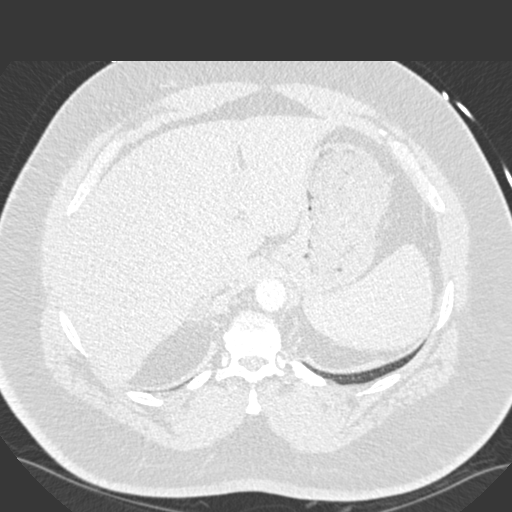
[im 77/293  soft-tissue]
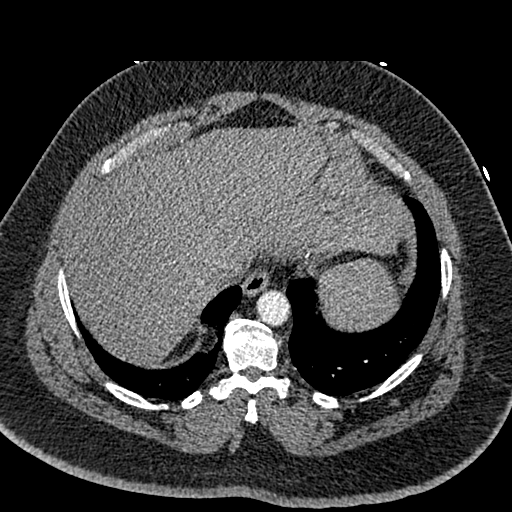
[im 102/293  lung]
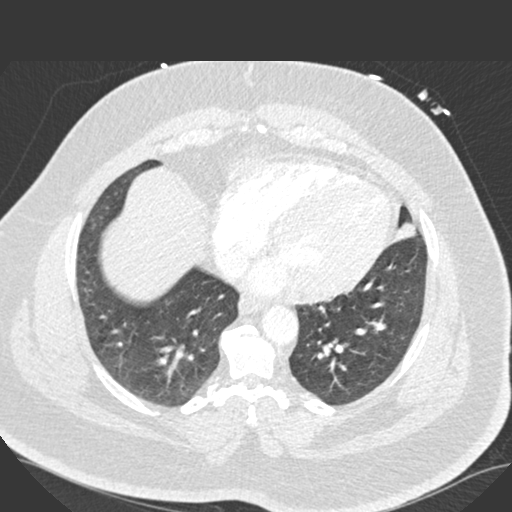
[im 115/293  soft-tissue]
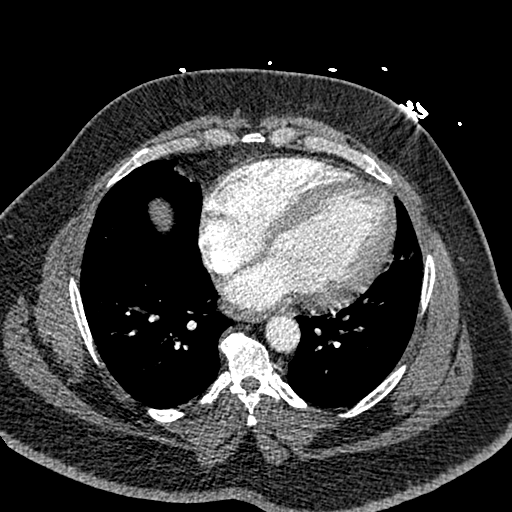
[im 140/293  lung]
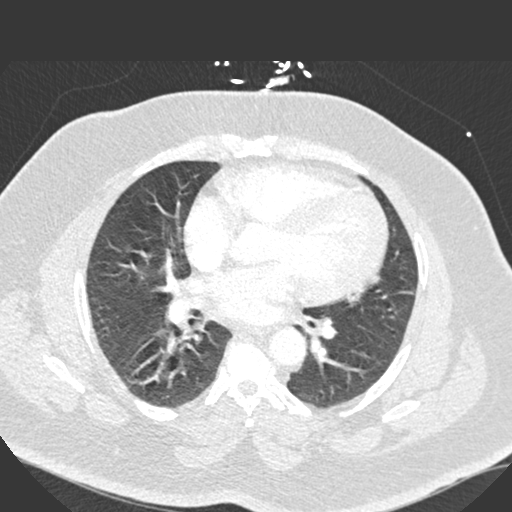
[im 153/293  soft-tissue]
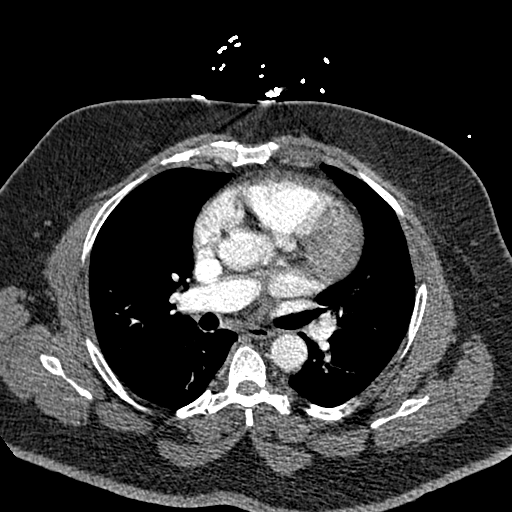
[im 178/293  lung]
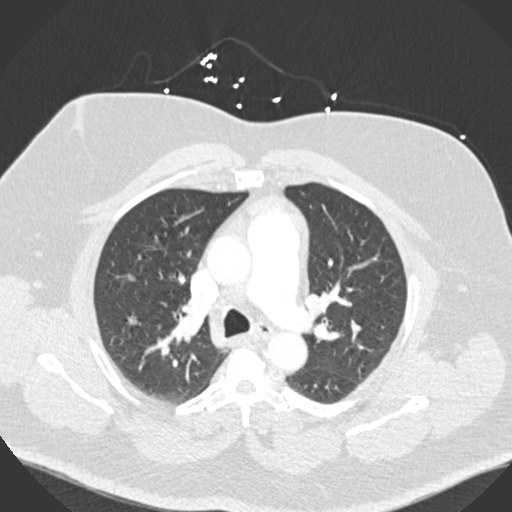
[im 191/293  soft-tissue]
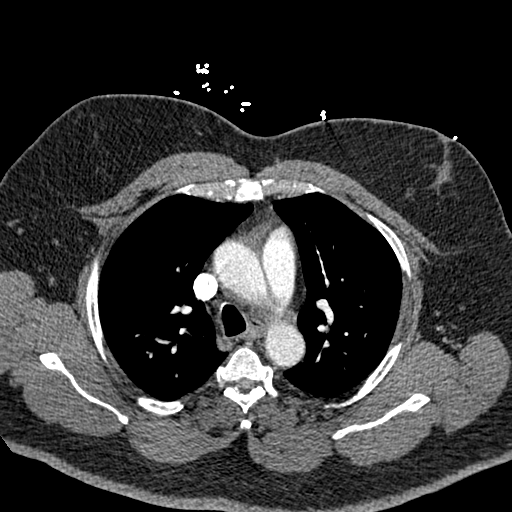
[im 216/293  lung]
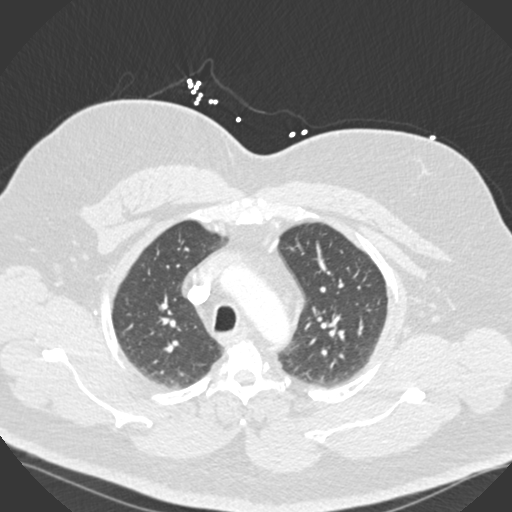
[im 242/293  soft-tissue]
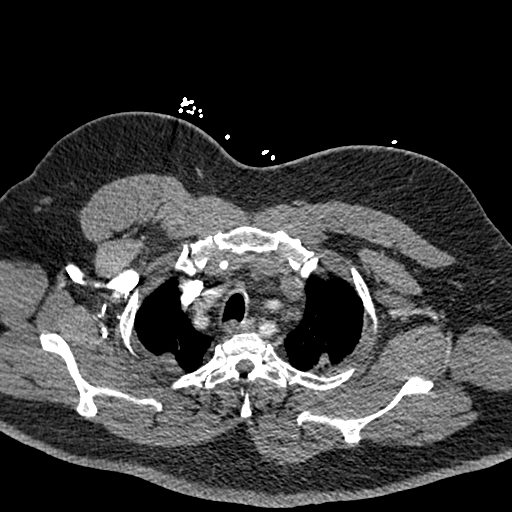
[im 254/293  lung]
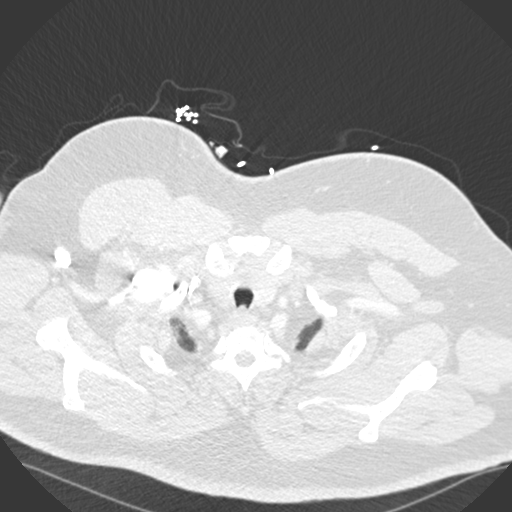
[im 280/293  soft-tissue]
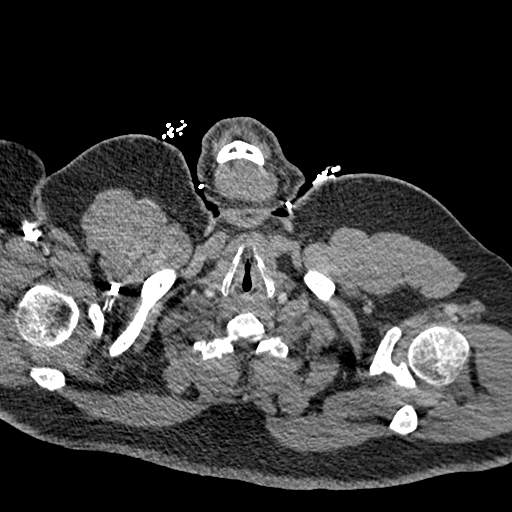

[Series 6: coronal mpr · coronal · 0.69mm/px · 3 of 204 slices shown]
[im 51/204  soft-tissue]
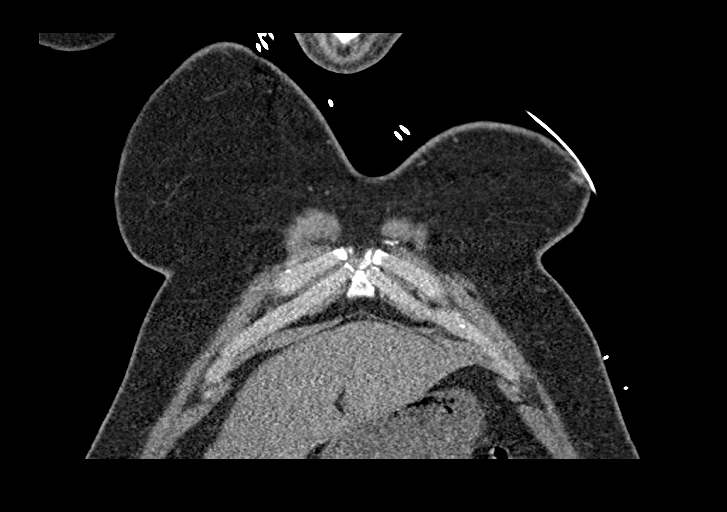
[im 102/204  soft-tissue]
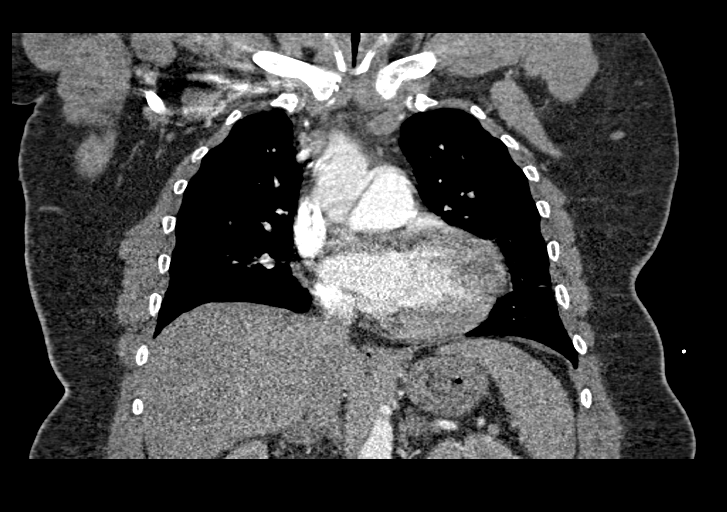
[im 153/204  soft-tissue]
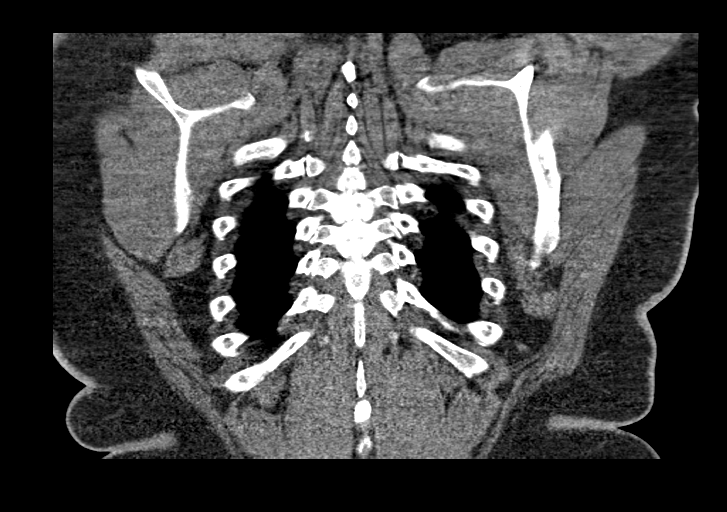

[17 of 46 positions shown; findings below may reference images not displayed]

FINDINGS: Cardiovascular: There is no evidence of significant pulmonary
embolus. Evaluation for pulmonary embolus is mildly suboptimal due
to motion artifact.

The heart is borderline normal in size. The thoracic aorta is
grossly unremarkable. The great vessels are within normal limits.

Mediastinum/Nodes: The mediastinum is grossly unremarkable. No
mediastinal lymphadenopathy is seen. No pericardial effusion is
identified. The thyroid gland is unremarkable. No axillary
lymphadenopathy is seen.

Lungs/Pleura: The lungs are grossly clear, aside from a few tiny
nonspecific pleural based nodular densities on the left, measuring
up to 5 mm. A small lymph node is seen along the left major fissure.
No pleural effusion or pneumothorax is seen. No dominant masses are
identified.

Upper Abdomen: The visualized portions of the liver and spleen are
unremarkable. The visualized portions of the adrenal glands and
kidneys are within normal limits.

Musculoskeletal: No acute osseous abnormalities are identified. The
visualized musculature is unremarkable in appearance.

Review of the MIP images confirms the above findings.
IMPRESSION: 1. No evidence of significant pulmonary embolus.
2. Few tiny nonspecific pleural based nodular densities on the left,
measuring up to 5 mm. No follow-up needed if patient is low-risk
(and has no known or suspected primary neoplasm). Non-contrast chest
CT can be considered in 12 months if patient is high-risk. This
recommendation follows the consensus statement: Guidelines for
Management of Incidental Pulmonary Nodules Detected on CT Images:

## 2017-12-06 IMAGING — CR DG CHEST 2V
2 series · 2 of 2 positions shown · non-contrast
Comparison: [DATE]

CLINICAL DATA: Cough and hemoptysis.

EXAM:
CHEST - 2 VIEW

[w chest pa]
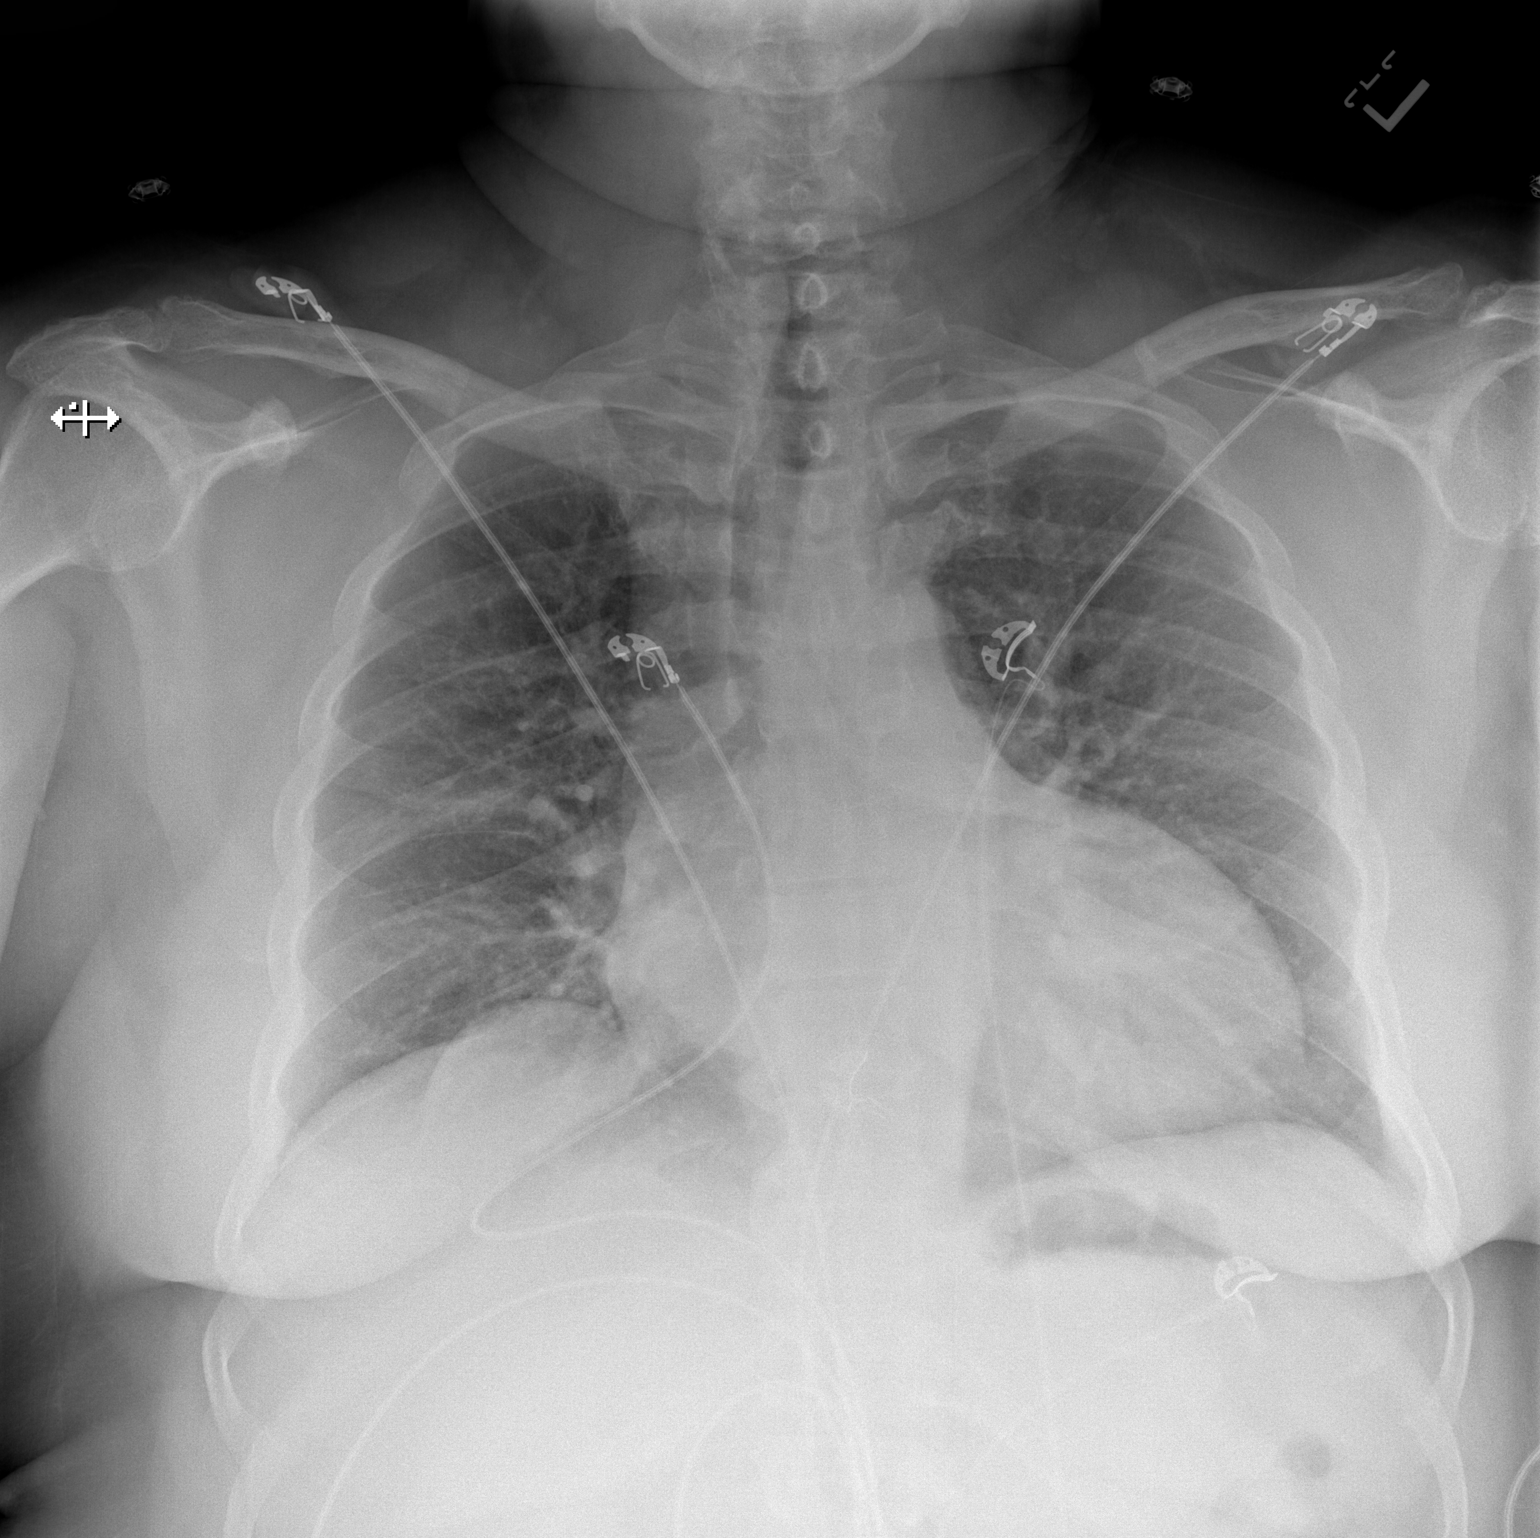

[w chest lat]
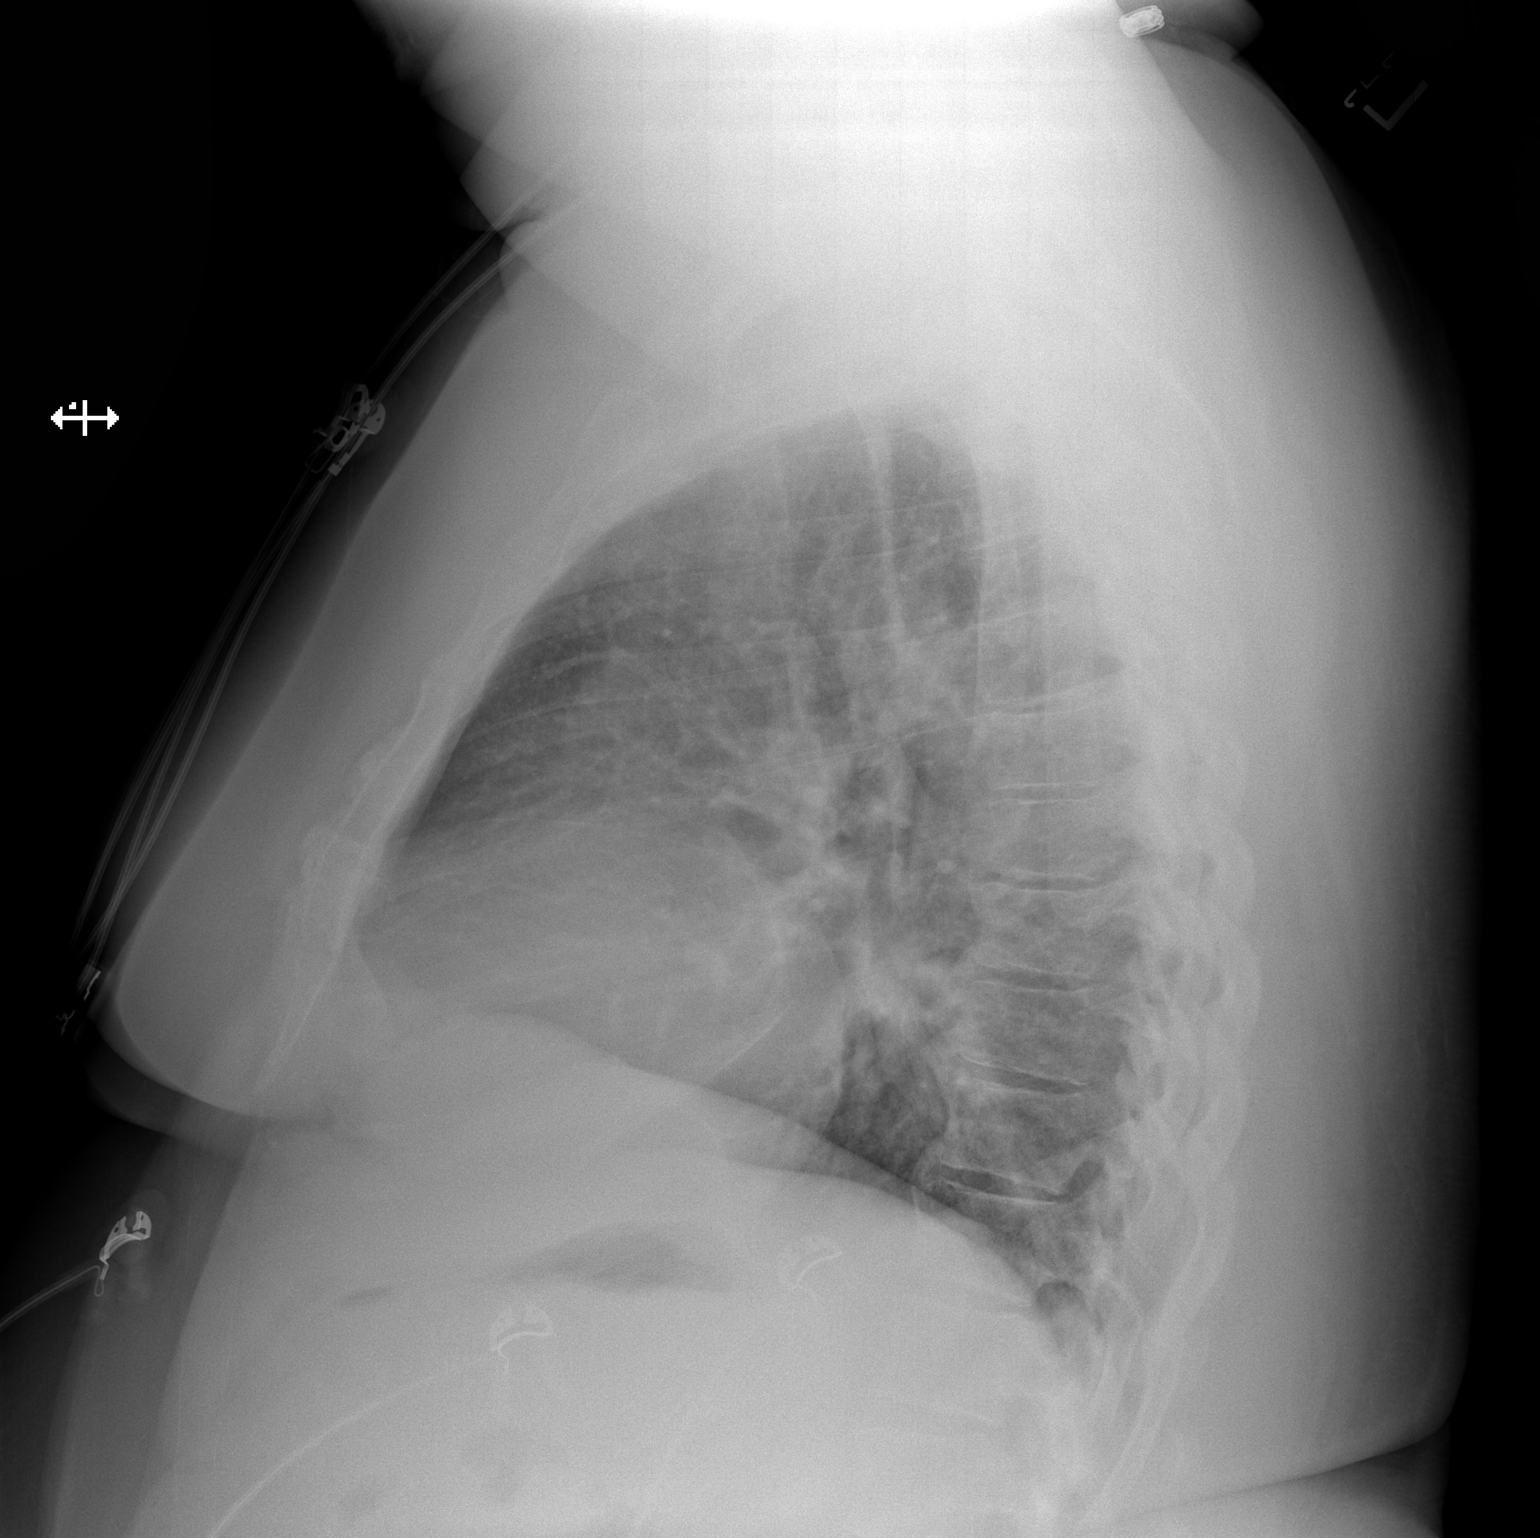

[2 of 2 positions shown; findings below may reference images not displayed]

FINDINGS: The heart is mildly enlarged with a left ventricular configuration.
The mediastinal and hilar contours appear stable. Low lung volumes
with vascular crowding and streaky atelectasis. Stable eventration
right hemidiaphragm. No definite infiltrates or effusions. No
worrisome pulmonary lesions. The bony thorax is intact.
IMPRESSION: Low lung volumes with vascular crowding and atelectasis. No definite
infiltrates or effusions.

## 2017-12-06 MED ORDER — IOPAMIDOL (ISOVUE-370) INJECTION 76%
100.0000 mL | Freq: Once | INTRAVENOUS | Status: AC | PRN
  Administered 2017-12-06: 100 mL via INTRAVENOUS

## 2017-12-06 MED ORDER — SODIUM CHLORIDE (PF) 0.9 % IJ SOLN
INTRAMUSCULAR | Status: AC
  Filled 2017-12-06: qty 50

## 2017-12-06 MED ORDER — IOPAMIDOL (ISOVUE-370) INJECTION 76%
INTRAVENOUS | Status: AC
  Filled 2017-12-06: qty 100

## 2017-12-06 NOTE — ED Notes (Signed)
Bed: SM27 Expected date:  Expected time:  Means of arrival:  Comments: Tr 3

## 2017-12-06 NOTE — ED Provider Notes (Signed)
[facility] DEPT Provider Note   CSN: [id number] Arrival date & time: 12/06/17  2151     History   Chief Complaint Chief Complaint  Patient presents with  . Hemoptysis    HPI Randy Morgan is a 52 y.o. male.  HPI   Pt is a 52 y/o male with a h/o HTN who presents to the ED today c/o a productive cough and hemoptysis ("dark red blood") that began 2 days ago. Denies a cough prior to the onset of hemoptysis. Today he began to have SOB and DOE. He also reports orthopnea and LE swelling (present for months). He denies chest pain or pain with inspiration.  Reports intermittent lower abd pain that is 5/10. Seems to better after drinking water or eating. Denies nausea, vomiting, diarrhea, constipation, bloody stools, hematuria.   Denies recent surgery/trauma, recent long travel, hormone use, personal hx of cancer, or hx of DVT/PE.   Has not taken BP medications.  Past Medical History:  Diagnosis Date  . Hypertension     Patient Active Problem List   Diagnosis Date Noted  . Acute respiratory failure with hypoxia ([place]) 12/07/2017  . Left arm weakness 12/19/2015  . BMI 45.0-49.9, adult ([place]) 12/15/2015  . Essential hypertension, benign 12/15/2015  . Smoker 12/15/2015    Past Surgical History:  Procedure Laterality Date  . NO PAST SURGERIES          Home Medications    Prior to Admission medications   Medication Sig Start Date End Date Taking? Authorizing Provider  Aspirin-Salicylamide-Caffeine (BC HEADACHE POWDER PO) Take 1 each by mouth every 8 (eight) hours as needed (pain).   Yes [provider]  albuterol (PROVENTIL HFA;VENTOLIN HFA) 108 (90 Base) MCG/ACT inhaler Inhale 2 puffs into the lungs every 4 (four) hours as needed for wheezing or shortness of breath (cough, shortness of breath or wheezing.). Patient not taking: Reported on 12/07/2017 01/30/16   Harrison Mons, PA  ipratropium (ATROVENT) 0.03 % nasal spray Place 2  sprays into both nostrils 2 (two) times daily. Patient not taking: Reported on 12/07/2017 02/27/16   Harrison Mons, PA  lisinopril-hydrochlorothiazide (PRINZIDE,ZESTORETIC) 10-12.5 MG tablet Take 2 tablets by mouth daily. Patient not taking: Reported on 12/07/2017 01/30/16   Harrison Mons, PA    Family History Family History  Problem Relation Age of Onset  . Heart disease Mother   . Diabetes Mother   . Asthma Mother   . Heart disease Father   . Asthma Sister   . Heart disease Brother   . Heart attack Brother     Social History Social History   Tobacco Use  . Smoking status: Current Some Day Smoker    Packs/day: 0.50    Years: 25.00    Pack years: 12.50    Types: Cigarettes  . Smokeless tobacco: Never Used  Substance Use Topics  . Alcohol use: Not Currently    Alcohol/week: 2.0 standard drinks    Types: 2 Cans of beer per week  . Drug use: No     Allergies   Patient has no known allergies.   Review of Systems Review of Systems  Constitutional: Negative for chills and fever.  HENT: Positive for congestion. Negative for sore throat.   Eyes: Negative for visual disturbance.  Respiratory: Positive for cough and shortness of breath.        Hemoptysis  Cardiovascular: Positive for leg swelling. Negative for chest pain.  Gastrointestinal: Positive for abdominal pain. Negative for constipation, diarrhea, nausea  and vomiting.  Genitourinary: Negative for hematuria.  Musculoskeletal: Negative for back pain.  Skin: Negative for rash.  Neurological: Negative for headaches.  All other systems reviewed and are negative.  Physical Exam Updated Vital Signs BP (!) 159/89 (BP Location: Left Arm)   Pulse 95   Temp 98.6 F (37 C) (Oral)   Resp 14   Ht 5' 6.5" (1.689 m)   Wt 131.5 kg   SpO2 96%   BMI 46.11 kg/m   Physical Exam  Constitutional: He appears well-developed and well-nourished. No distress.  HENT:  Head: Normocephalic and atraumatic.  Eyes: Conjunctivae  are normal.  Neck: Neck supple.  Cardiovascular: Regular rhythm and normal heart sounds.  No murmur heard. Borderline tachycardic on monitor  Pulmonary/Chest: Effort normal. No respiratory distress. He has no wheezes. He has no rales.  Decreased breath sounds on the left  Abdominal: Soft. Bowel sounds are normal. He exhibits no distension. There is no tenderness. There is no guarding.  Musculoskeletal: He exhibits edema (2+).  No calf TTP.   Neurological: He is alert.  Skin: Skin is warm and dry. Capillary refill takes less than 2 seconds.  Psychiatric: He has a normal mood and affect.  Nursing note and vitals reviewed.  ED Treatments / Results  Labs (all labs ordered are listed, but only abnormal results are displayed) Labs Reviewed  COMPREHENSIVE METABOLIC PANEL - Abnormal; Notable for the following components:      Result Value   Glucose, Bld 119 (*)    Creatinine, Ser 1.25 (*)    All other components within normal limits  CBC  BRAIN NATRIURETIC PEPTIDE  I-STAT TROPONIN, ED  I-STAT BETA HCG BLOOD, ED ([facility], [facility], [facility] ONLY)  TYPE AND SCREEN  ABO/RH    EKG None  Radiology Dg Chest 2 View  Result Date: 12/06/2017 CLINICAL DATA:  Cough and hemoptysis. EXAM: CHEST - 2 VIEW COMPARISON:  12/15/2015 FINDINGS: The heart is mildly enlarged with a left ventricular configuration. The mediastinal and hilar contours appear stable. Low lung volumes with vascular crowding and streaky atelectasis. Stable eventration right hemidiaphragm. No definite infiltrates or effusions. No worrisome pulmonary lesions. The bony thorax is intact. IMPRESSION: Low lung volumes with vascular crowding and atelectasis. No definite infiltrates or effusions. Electronically Signed   By: Marijo Sanes M.D.   On: 12/06/2017 23:03   Ct Angio Chest Pe W And/or Wo Contrast  Result Date: 12/06/2017 CLINICAL DATA:  Acute onset of dyspnea and cough. Angina. EXAM: CT ANGIOGRAPHY CHEST WITH CONTRAST TECHNIQUE: Multidetector  CT imaging of the chest was performed using the standard protocol during bolus administration of intravenous contrast. Multiplanar CT image reconstructions and MIPs were obtained to evaluate the vascular anatomy. CONTRAST:  172mL ISOVUE-370 IOPAMIDOL (ISOVUE-370) INJECTION 76% COMPARISON:  Chest radiograph performed earlier today at 10:55 p.m. FINDINGS: Cardiovascular: There is no evidence of significant pulmonary embolus. Evaluation for pulmonary embolus is mildly suboptimal due to motion artifact. The heart is borderline normal in size. The thoracic aorta is grossly unremarkable. The great vessels are within normal limits. Mediastinum/Nodes: The mediastinum is grossly unremarkable. No mediastinal lymphadenopathy is seen. No pericardial effusion is identified. The thyroid gland is unremarkable. No axillary lymphadenopathy is seen. Lungs/Pleura: The lungs are grossly clear, aside from a few tiny nonspecific pleural based nodular densities on the left, measuring up to 5 mm. A small lymph node is seen along the left major fissure. No pleural effusion or pneumothorax is seen. No dominant masses are identified. Upper Abdomen: The visualized  portions of the liver and spleen are unremarkable. The visualized portions of the adrenal glands and kidneys are within normal limits. Musculoskeletal: No acute osseous abnormalities are identified. The visualized musculature is unremarkable in appearance. Review of the MIP images confirms the above findings. IMPRESSION: 1. No evidence of significant pulmonary embolus. 2. Few tiny nonspecific pleural based nodular densities on the left, measuring up to 5 mm. No follow-up needed if patient is low-risk (and has no known or suspected primary neoplasm). Non-contrast chest CT can be considered in 12 months if patient is high-risk. This recommendation follows the consensus statement: Guidelines for Management of Incidental Pulmonary Nodules Detected on CT Images: From the Fleischner  Society 2017; Radiology 2017; 284:228-243. Electronically Signed   By: Garald Balding M.D.   On: 12/06/2017 23:50    Procedures Procedures (including critical care time)  Medications Ordered in ED Medications  sodium chloride (PF) 0.9 % injection (has no administration in time range)  iopamidol (ISOVUE-370) 76 % injection (has no administration in time range)  iopamidol (ISOVUE-370) 76 % injection 100 mL (100 mLs Intravenous Contrast Given 12/06/17 2339)  ipratropium-albuterol (DUONEB) 0.5-2.5 (3) MG/3ML nebulizer solution 3 mL (3 mLs Nebulization Given 12/07/17 0112)  ipratropium-albuterol (DUONEB) 0.5-2.5 (3) MG/3ML nebulizer solution 3 mL (3 mLs Nebulization Given 12/07/17 0112)     Initial Impression / Assessment and Plan / ED Course  I have reviewed the triage vital signs and the nursing notes.  Pertinent labs & imaging results that were available during my care of the patient were reviewed by me and considered in my medical decision making (see chart for details).     Final Clinical Impressions(s) / ED Diagnoses   Final diagnoses:  Hypoxia   Pt presenting with c/o hemoptysis and DOE starting yesterday. Has had orthopnea and LE edema for some time. No fevers. initially satting at 90% on RA and borderline tachycardic. BP also high. No fevers.   Lungs with decreased lung sounds on the left. Heart regular rhythm, borderline tachy.   CBC WNL. CMP with elevated creatinine at 1.25. Otherwise WNL. bnp normal. Trop normal. CXR low lung volumes with vascular crowding and atelectasis, no definite infiltrate or effusion.   CT PE without evidence of PE. Few nonspecific pleural base nodular densities, recommended ct f/u in 12 months. Informed pt.   After neb tx pt was ambulated and he desatted to 89% and became tachypneic. Suspect sxs could be due to underlying viral infection given his cough. He may also have undiagnosed chronic lung disease given his h/o tobacco use which could be  contributing to his sxs. Will plan for admission for observation given his hypoxia. Case discussed with Dr. Tyrone Nine who recommended admission.  Case discussed with Dr. Hal Hope who accepts pt for admission.  ED Discharge Orders    None       Rodney Booze, PA-C 12/07/17 [place], [place], DO 12/07/17 [phone]

## 2017-12-06 NOTE — ED Triage Notes (Signed)
Pt c/o vomiting blood x 2 days.  Currently no PCP.

## 2017-12-07 ENCOUNTER — Observation Stay (HOSPITAL_COMMUNITY): Payer: Self-pay

## 2017-12-07 ENCOUNTER — Encounter (HOSPITAL_COMMUNITY): Payer: Self-pay | Admitting: Internal Medicine

## 2017-12-07 DIAGNOSIS — R042 Hemoptysis: Secondary | ICD-10-CM | POA: Diagnosis present

## 2017-12-07 DIAGNOSIS — J9601 Acute respiratory failure with hypoxia: Secondary | ICD-10-CM | POA: Diagnosis present

## 2017-12-07 DIAGNOSIS — J9600 Acute respiratory failure, unspecified whether with hypoxia or hypercapnia: Secondary | ICD-10-CM | POA: Diagnosis present

## 2017-12-07 LAB — CBC WITH DIFFERENTIAL/PLATELET
Abs Immature Granulocytes: 0.03 10*3/uL (ref 0.00–0.07)
BASOS ABS: 0 10*3/uL (ref 0.0–0.1)
Basophils Relative: 0 %
EOS ABS: 0.1 10*3/uL (ref 0.0–0.5)
EOS PCT: 3 %
HEMATOCRIT: 42.3 % (ref 39.0–52.0)
Hemoglobin: 13.7 g/dL (ref 13.0–17.0)
Immature Granulocytes: 1 %
Lymphocytes Relative: 41 %
Lymphs Abs: 2.1 10*3/uL (ref 0.7–4.0)
MCH: 29 pg (ref 26.0–34.0)
MCHC: 32.4 g/dL (ref 30.0–36.0)
MCV: 89.4 fL (ref 80.0–100.0)
Monocytes Absolute: 0.5 10*3/uL (ref 0.1–1.0)
Monocytes Relative: 9 %
NRBC: 0 % (ref 0.0–0.2)
Neutro Abs: 2.4 10*3/uL (ref 1.7–7.7)
Neutrophils Relative %: 46 %
Platelets: 204 10*3/uL (ref 150–400)
RBC: 4.73 MIL/uL (ref 4.22–5.81)
RDW: 12.9 % (ref 11.5–15.5)
WBC: 5.1 10*3/uL (ref 4.0–10.5)

## 2017-12-07 LAB — BASIC METABOLIC PANEL
Anion gap: 8 (ref 5–15)
BUN: 22 mg/dL — ABNORMAL HIGH (ref 6–20)
CALCIUM: 8.8 mg/dL — AB (ref 8.9–10.3)
CO2: 29 mmol/L (ref 22–32)
CREATININE: 1.07 mg/dL (ref 0.61–1.24)
Chloride: 102 mmol/L (ref 98–111)
GFR calc non Af Amer: >60 mL/min (ref >60)
Glucose, Bld: 127 mg/dL — ABNORMAL HIGH (ref 70–99)
Potassium: 3.6 mmol/L (ref 3.5–5.1)
SODIUM: 139 mmol/L (ref 135–145)

## 2017-12-07 LAB — TROPONIN I: Troponin I: <0.03 ng/mL (ref <0.03)

## 2017-12-07 LAB — PHOSPHORUS: Phosphorus: 5.1 mg/dL — ABNORMAL HIGH (ref 2.5–4.6)

## 2017-12-07 LAB — HEPATIC FUNCTION PANEL
ALT: 17 U/L (ref 0–44)
AST: 20 U/L (ref 15–41)
Albumin: 4.2 g/dL (ref 3.5–5.0)
Alkaline Phosphatase: 54 U/L (ref 38–126)
BILIRUBIN DIRECT: 0.1 mg/dL (ref 0.0–0.2)
BILIRUBIN TOTAL: 0.9 mg/dL (ref 0.3–1.2)
Indirect Bilirubin: 0.8 mg/dL (ref 0.3–0.9)
Total Protein: 7.4 g/dL (ref 6.5–8.1)

## 2017-12-07 LAB — PROTIME-INR
INR: 0.93
PROTHROMBIN TIME: 12.4 s (ref 11.4–15.2)

## 2017-12-07 LAB — MAGNESIUM: Magnesium: 2.5 mg/dL — ABNORMAL HIGH (ref 1.7–2.4)

## 2017-12-07 LAB — HIV ANTIBODY (ROUTINE TESTING W REFLEX): HIV Screen 4th Generation wRfx: NONREACTIVE

## 2017-12-07 LAB — ABO/RH: ABO/RH(D): A POS

## 2017-12-07 LAB — ECHOCARDIOGRAM COMPLETE
HEIGHTINCHES: 66.5 in
WEIGHTICAEL: 4640 [oz_av]

## 2017-12-07 LAB — BRAIN NATRIURETIC PEPTIDE: B NATRIURETIC PEPTIDE 5: 24.3 pg/mL (ref 0.0–100.0)

## 2017-12-07 LAB — SALICYLATE LEVEL: Salicylate Lvl: <7 mg/dL (ref 2.8–30.0)

## 2017-12-07 MED ORDER — HYDROCHLOROTHIAZIDE 25 MG PO TABS
25.0000 mg | ORAL_TABLET | Freq: Every day | ORAL | Status: DC
  Administered 2017-12-07 – 2017-12-08 (×2): 25 mg via ORAL
  Filled 2017-12-07 (×4): qty 1

## 2017-12-07 MED ORDER — ACETAMINOPHEN 650 MG RE SUPP: 650.0000 mg | Freq: Four times a day (QID) | RECTAL | Status: DC | PRN

## 2017-12-07 MED ORDER — IPRATROPIUM-ALBUTEROL 0.5-2.5 (3) MG/3ML IN SOLN
3.0000 mL | Freq: Once | RESPIRATORY_TRACT | Status: AC
  Administered 2017-12-07: 3 mL via RESPIRATORY_TRACT
  Filled 2017-12-07: qty 3

## 2017-12-07 MED ORDER — ACETAMINOPHEN 325 MG PO TABS
650.0000 mg | ORAL_TABLET | Freq: Four times a day (QID) | ORAL | Status: DC | PRN
  Administered 2017-12-07 – 2017-12-08 (×3): 650 mg via ORAL
  Filled 2017-12-07 (×3): qty 2

## 2017-12-07 MED ORDER — LISINOPRIL-HYDROCHLOROTHIAZIDE 10-12.5 MG PO TABS: 2.0000 | ORAL_TABLET | Freq: Every day | ORAL | Status: DC

## 2017-12-07 MED ORDER — HYDRALAZINE HCL 20 MG/ML IJ SOLN: 5.0000 mg | INTRAMUSCULAR | Status: DC | PRN

## 2017-12-07 MED ORDER — LISINOPRIL 20 MG PO TABS
20.0000 mg | ORAL_TABLET | Freq: Every day | ORAL | Status: DC
  Administered 2017-12-07 – 2017-12-08 (×2): 20 mg via ORAL
  Filled 2017-12-07 (×3): qty 1

## 2017-12-07 MED ORDER — ONDANSETRON HCL 4 MG PO TABS: 4.0000 mg | ORAL_TABLET | Freq: Four times a day (QID) | ORAL | Status: DC | PRN

## 2017-12-07 MED ORDER — ONDANSETRON HCL 4 MG/2ML IJ SOLN: 4.0000 mg | Freq: Four times a day (QID) | INTRAMUSCULAR | Status: DC | PRN

## 2017-12-07 NOTE — Progress Notes (Signed)
12/07/17  Pt MEW score yellow d/t HR while ambulating. MEW score green at rest.

## 2017-12-07 NOTE — ED Notes (Signed)
I have just phoned report to Viola, RN on [place]. Will transport shortly. Pt. Is in no distress and thanks [place] for our care.

## 2017-12-07 NOTE — ED Notes (Signed)
Pt ambulated without oxygen and lowest oxygen saturation was 89% with a respiratory rate of 57. Pt back in room and oxygen saturation 92% on room air with a respiratory rate of 16 and pt reports feeling winded after ambulating.

## 2017-12-07 NOTE — Plan of Care (Signed)
  Problem: Pain Managment: Goal: General experience of comfort will improve Outcome: Progressing   Problem: Safety: Goal: Ability to remain free from injury will improve Outcome: Progressing   Problem: Skin Integrity: Goal: Risk for impaired skin integrity will decrease Outcome: Progressing   

## 2017-12-07 NOTE — Progress Notes (Signed)
Patient was admitted early this morning after midnight and H&P has been reviewed and I am in current agreement with assessment and plan done by Dr. Gean Birchwood.  Additional changes the plan of care been made accordingly.  The patient is a 52 year old morbidly obese African-American male with a past medical history significant for hypertension, and tobacco abuse who presented to [facility] emergency room with a chief complaint of shortness of breath and coughing up blood.  He has been taking Goody powder for his low back pain and has not noticed any blood in his stools or vomit but states that he has had some significant hemoptysis.  In the ED he was noted to be hypoxic requiring oxygen and given his hypoxia and hemoptysis he was admitted for further observation and evaluation for acute respiratory failure with hypoxia and hemoptysis.  In the ED patient underwent a CTA of the chest to rule out PE and showed no evidence of significant pulmonary embolus.  There is a few tiny nonspecific pleural-based nodular density in the left measuring up to 5 mm and a small lymph node that was seen in the left major fissure.  There is no pleural effusion or pneumothorax seen in the lungs are grossly clear aside from the nodular nonspecific pleural-based densities. I discussed the case with pulmonary Dr. Rodman Pickle who recommends no formal consultation at this time and continued observation and quantifying the amount that he is actually spitting up.  If patient starts to cough up more blood overnight she recommended to make the patient n.p.o. at midnight empirically and inform the pulmonology team tomorrow for possible bronchoscopy.  As of now she recommended no intervention and just close observation.  If patient becomes hemodynamically unstable and because of the massive amount of blood longevity transferred to the unit and Pulmonary will come in and do an emergent bronchoscopy.  We will continue monitor patient's clinical  sponsor intervention and repeat blood work and a chest x-ray in the a.m.

## 2017-12-07 NOTE — H&P (Signed)
History and Physical    Randy Morgan FIE:[id number] DOB: 1965/08/05 DOA: 12/06/2017  PCP: Patient, No Pcp Per  Patient coming from: Home.  Chief Complaint: Coughing of blood.  HPI: Randy Morgan is a 52 y.o. male with history of hypertension who has not been taking his antihypertensive for about 6 months after he changed jobs presents to the ER with complaints of having shortness of breath and coughing of blood.  Patient states over the last 2 days he has been coughing up blood at times clots which shortness of breath present even at rest.  Denies any fever chills night sweats or weight loss.  Been taking Goody powder for low back pain.  Has not noticed any blood in his stools or any vomiting blood.  Has any focal deficits.  ED Course: In the ER patient had hemoptysis once.  CT angiogram of the chest does not show anything acute.  Hemoglobin is stable.  Patient was hypoxic requiring oxygen.  Given his hypoxia and hemoptysis patient admitted for further observation.  Blood pressure has been remaining high during the stay in the ER.  Review of Systems: As per HPI, rest all negative.   Past Medical History:  Diagnosis Date  . Hypertension     Past Surgical History:  Procedure Laterality Date  . NO PAST SURGERIES       reports that he has been smoking cigarettes. He has a 12.50 pack-year smoking history. He has never used smokeless tobacco. He reports that he drank about 2.0 standard drinks of alcohol per week. He reports that he does not use drugs.  No Known Allergies  Family History  Problem Relation Age of Onset  . Heart disease Mother   . Diabetes Mother   . Asthma Mother   . Heart disease Father   . Asthma Sister   . Heart disease Brother   . Heart attack Brother     Prior to Admission medications   Medication Sig Start Date End Date Taking? Authorizing Provider  Aspirin-Salicylamide-Caffeine (BC HEADACHE POWDER PO) Take 1 each by mouth every 8 (eight) hours as  needed (pain).   Yes [provider]  albuterol (PROVENTIL HFA;VENTOLIN HFA) 108 (90 Base) MCG/ACT inhaler Inhale 2 puffs into the lungs every 4 (four) hours as needed for wheezing or shortness of breath (cough, shortness of breath or wheezing.). Patient not taking: Reported on 12/07/2017 01/30/16   Harrison Mons, PA  ipratropium (ATROVENT) 0.03 % nasal spray Place 2 sprays into both nostrils 2 (two) times daily. Patient not taking: Reported on 12/07/2017 02/27/16   Harrison Mons, PA  lisinopril-hydrochlorothiazide (PRINZIDE,ZESTORETIC) 10-12.5 MG tablet Take 2 tablets by mouth daily. Patient not taking: Reported on 12/07/2017 01/30/16   Harrison Mons, PA    Physical Exam: Vitals:   12/07/17 0228 12/07/17 0325 12/07/17 0400 12/07/17 0430  BP: (!) 159/89 (!) 149/78 (!) 145/86 (!) 159/96  Pulse: 95 90 89 85  Resp: 14 16 15 14   Temp:  97.9 F (36.6 C)    TempSrc:  Oral    SpO2: 96% 95% 95% (!) 88%  Weight:      Height:          Constitutional: Moderately built and nourished. Vitals:   12/07/17 0228 12/07/17 0325 12/07/17 0400 12/07/17 0430  BP: (!) 159/89 (!) 149/78 (!) 145/86 (!) 159/96  Pulse: 95 90 89 85  Resp: 14 16 15 14   Temp:  97.9 F (36.6 C)    TempSrc:  Oral  SpO2: 96% 95% 95% (!) 88%  Weight:      Height:       Eyes: Anicteric no pallor. ENMT: No discharge from the ears eyes nose or mouth. Neck: No mass or.  No neck rigidity.  No JVD appreciated. Respiratory: No rhonchi or crepitations. Cardiovascular: S1-S2 heard no murmurs appreciated. Abdomen: Soft nontender bowel sounds present. Musculoskeletal: No edema.  No joint effusion. Skin: No rash. Neurologic: Alert awake oriented to time place and person.  Moves all extremities. Psychiatric: Appears normal per normal affect.   Labs on Admission: I have personally reviewed following labs and imaging studies  CBC: Recent Labs  Lab 12/06/17 2221  WBC 6.0  HGB 14.2  HCT 43.0  MCV 87.2  PLT 225     Basic Metabolic Panel: Recent Labs  Lab 12/06/17 2221  NA 140  K 3.6  CL 101  CO2 30  GLUCOSE 119*  BUN 20  CREATININE 1.25*  CALCIUM 8.9   GFR: Estimated Creatinine Clearance: 89.6 mL/min (A) (by C-G formula based on SCr of 1.25 mg/dL (H)). Liver Function Tests: Recent Labs  Lab 12/06/17 2221  AST 20  ALT 17  ALKPHOS 54  BILITOT 0.6  PROT 7.4  ALBUMIN 4.0   No results for input(s): LIPASE, AMYLASE in the last 168 hours. No results for input(s): AMMONIA in the last 168 hours. Coagulation Profile: No results for input(s): INR, PROTIME in the last 168 hours. Cardiac Enzymes: No results for input(s): CKTOTAL, CKMB, CKMBINDEX, TROPONINI in the last 168 hours. BNP (last 3 results) No results for input(s): PROBNP in the last 8760 hours. HbA1C: No results for input(s): HGBA1C in the last 72 hours. CBG: No results for input(s): GLUCAP in the last 168 hours. Lipid Profile: No results for input(s): CHOL, HDL, LDLCALC, TRIG, CHOLHDL, LDLDIRECT in the last 72 hours. Thyroid Function Tests: No results for input(s): TSH, T4TOTAL, FREET4, T3FREE, THYROIDAB in the last 72 hours. Anemia Panel: No results for input(s): VITAMINB12, FOLATE, FERRITIN, TIBC, IRON, RETICCTPCT in the last 72 hours. Urine analysis: No results found for: COLORURINE, APPEARANCEUR, LABSPEC, PHURINE, GLUCOSEU, HGBUR, BILIRUBINUR, KETONESUR, PROTEINUR, UROBILINOGEN, NITRITE, LEUKOCYTESUR Sepsis Labs: @LABRCNTIP (procalcitonin:4,lacticidven:4) )No results found for this or any previous visit (from the past 240 hour(s)).   Radiological Exams on Admission: Dg Chest 2 View  Result Date: 12/06/2017 CLINICAL DATA:  Cough and hemoptysis. EXAM: CHEST - 2 VIEW COMPARISON:  12/15/2015 FINDINGS: The heart is mildly enlarged with a left ventricular configuration. The mediastinal and hilar contours appear stable. Low lung volumes with vascular crowding and streaky atelectasis. Stable eventration right hemidiaphragm. No  definite infiltrates or effusions. No worrisome pulmonary lesions. The bony thorax is intact. IMPRESSION: Low lung volumes with vascular crowding and atelectasis. No definite infiltrates or effusions. Electronically Signed   By: Marijo Sanes M.D.   On: 12/06/2017 23:03   Ct Angio Chest Pe W And/or Wo Contrast  Result Date: 12/06/2017 CLINICAL DATA:  Acute onset of dyspnea and cough. Angina. EXAM: CT ANGIOGRAPHY CHEST WITH CONTRAST TECHNIQUE: Multidetector CT imaging of the chest was performed using the standard protocol during bolus administration of intravenous contrast. Multiplanar CT image reconstructions and MIPs were obtained to evaluate the vascular anatomy. CONTRAST:  124mL ISOVUE-370 IOPAMIDOL (ISOVUE-370) INJECTION 76% COMPARISON:  Chest radiograph performed earlier today at 10:55 p.m. FINDINGS: Cardiovascular: There is no evidence of significant pulmonary embolus. Evaluation for pulmonary embolus is mildly suboptimal due to motion artifact. The heart is borderline normal in size. The thoracic aorta is grossly  unremarkable. The great vessels are within normal limits. Mediastinum/Nodes: The mediastinum is grossly unremarkable. No mediastinal lymphadenopathy is seen. No pericardial effusion is identified. The thyroid gland is unremarkable. No axillary lymphadenopathy is seen. Lungs/Pleura: The lungs are grossly clear, aside from a few tiny nonspecific pleural based nodular densities on the left, measuring up to 5 mm. A small lymph node is seen along the left major fissure. No pleural effusion or pneumothorax is seen. No dominant masses are identified. Upper Abdomen: The visualized portions of the liver and spleen are unremarkable. The visualized portions of the adrenal glands and kidneys are within normal limits. Musculoskeletal: No acute osseous abnormalities are identified. The visualized musculature is unremarkable in appearance. Review of the MIP images confirms the above findings. IMPRESSION: 1. No  evidence of significant pulmonary embolus. 2. Few tiny nonspecific pleural based nodular densities on the left, measuring up to 5 mm. No follow-up needed if patient is low-risk (and has no known or suspected primary neoplasm). Non-contrast chest CT can be considered in 12 months if patient is high-risk. This recommendation follows the consensus statement: Guidelines for Management of Incidental Pulmonary Nodules Detected on CT Images: From the Fleischner Society 2017; Radiology 2017; 284:228-243. Electronically Signed   By: Garald Balding M.D.   On: 12/06/2017 23:50    EKG: Independently reviewed.  Sinus tachycardia.  Assessment/Plan Active Problems:   BMI 45.0-49.9, adult ([facility])   Essential hypertension, benign   Acute respiratory failure with hypoxia ([facility])   Hemoptysis   Acute respiratory failure ([place])    1. Acute respiratory failure with hypoxia and hemoptysis -we will repeat chest x-ray in the morning and follow CBC check INR.  If patient has repeat hemoptysis or has been persistent hypoxic or chest x-ray shows any worsening may need pulmonary consult.  Patient has no signs or risk for TB. 2. Hypertension uncontrolled -reviewing patient's chart patient used to be on lisinopril and hydrochlorothiazide which patient discontinued many months ago after his change of job.  We will restart patient back on his medication since patient has uncontrolled blood pressure.  Patient has mildly elevated creatinine which has been on for some time even when patient was on lisinopril hydrochlorothiazide.  Will add PRN IV hydralazine. 3. Tobacco abuse -advised to quit smoking.  Patient agrees to quit. 4. CT scan showing some pleural-based densities which will need follow-up within 12 months.   DVT prophylaxis: SCDs. Code Status: Full code. Family Communication: Discussed with patient. Disposition Plan: Home. Consults called: None. Admission status: Observation.   Rise Patience MD [facility] Pager [phone].  If 7PM-7AM, please contact night-coverage [email or web address] Password [facility]  12/07/2017, 5:33 AM

## 2017-12-07 NOTE — ED Notes (Signed)
Pt sleeping and SPO2 ranging from 84%-88%. Pt placed on 2L O2 via [place]. PA notified.

## 2017-12-07 NOTE — ED Notes (Signed)
Report attempted no answer from nurse

## 2017-12-07 NOTE — ED Notes (Signed)
ED TO INPATIENT HANDOFF REPORT  Name/Age/Gender Randy Morgan 52 y.o. male  Code Status    Code Status Orders  (From admission, onward)         Start     Ordered   12/07/17 0531  Full code  Continuous     12/07/17 0532        Code Status History    This patient has a current code status but no historical code status.      Home/SNF/Other Home  Chief Complaint SOB, Emesis with blood  Level of Care/Admitting Diagnosis ED Disposition    ED Disposition Condition Comment   Admit  Hospital Area: [facility] [[id number]]  Level of Care: Telemetry [5]  Admit to tele based on following criteria: Monitor for Ischemic changes  Diagnosis: Acute respiratory failure ([place]) [518.81.ICD-9-CM]  Admitting Physician: Rise Patience [phone]  Attending Physician: Rise Patience [phone]  PT Class (Do Not Modify): Observation [104]  PT Acc Code (Do Not Modify): Observation [10022]       Medical History Past Medical History:  Diagnosis Date  . Hypertension     Allergies No Known Allergies  IV Location/Drains/Wounds Patient Lines/Drains/Airways Status   Active Line/Drains/Airways    Name:   Placement date:   Placement time:   Site:   Days:   Peripheral IV 12/06/17 Left Antecubital   12/06/17    2319    Antecubital   1          Labs/Imaging Results for orders placed or performed during the hospital encounter of 12/06/17 (from the past 48 hour(s))  Comprehensive metabolic panel     Status: Abnormal   Collection Time: 12/06/17 10:21 PM  Result Value Ref Range   Sodium 140 135 - 145 mmol/L   Potassium 3.6 3.5 - 5.1 mmol/L   Chloride 101 98 - 111 mmol/L   CO2 30 22 - 32 mmol/L   Glucose, Bld 119 (H) 70 - 99 mg/dL   BUN 20 6 - 20 mg/dL   Creatinine, Ser 1.25 (H) 0.61 - 1.24 mg/dL   Calcium 8.9 8.9 - 10.3 mg/dL   Total Protein 7.4 6.5 - 8.1 g/dL   Albumin 4.0 3.5 - 5.0 g/dL   AST 20 15 - 41 U/L   ALT 17 0 - 44 U/L   Alkaline Phosphatase 54  38 - 126 U/L   Total Bilirubin 0.6 0.3 - 1.2 mg/dL   GFR calc non Af Amer >60 >60 mL/min   GFR calc Af Amer >60 >60 mL/min    Comment: (NOTE) The eGFR has been calculated using the CKD EPI equation. This calculation has not been validated in all clinical situations. eGFR's persistently <60 mL/min signify possible Chronic Kidney Disease.    Anion gap 9 5 - 15    Comment: Performed at [facility], [place] [address], [place], [place] [address]  CBC     Status: None   Collection Time: 12/06/17 10:21 PM  Result Value Ref Range   WBC 6.0 4.0 - 10.5 K/uL   RBC 4.93 4.22 - 5.81 MIL/uL   Hemoglobin 14.2 13.0 - 17.0 g/dL   HCT 43.0 39.0 - 52.0 %   MCV 87.2 80.0 - 100.0 fL   MCH 28.8 26.0 - 34.0 pg   MCHC 33.0 30.0 - 36.0 g/dL   RDW 12.8 11.5 - 15.5 %   Platelets 225 150 - 400 K/uL   nRBC 0.0 0.0 - 0.2 %    Comment:  Performed at [facility], [place] [address] [place], [place] [address]  Type and screen [place]     Status: None   Collection Time: 12/06/17 10:21 PM  Result Value Ref Range   ABO/RH(D) A POS    Antibody Screen NEG    Sample Expiration      12/09/2017 Performed at [facility], [place] [address], [place], [place] [address]   Brain natriuretic peptide     Status: None   Collection Time: 12/06/17 10:21 PM  Result Value Ref Range   B Natriuretic Peptide 24.3 0.0 - 100.0 pg/mL    Comment: Performed at [facility], [place] [address], [place],  [address]  I-Stat beta hCG blood, ED ([facility], [facility], [facility] only)     Status: None   Collection Time: 12/06/17 11:23 PM  Result Value Ref Range   I-stat hCG, quantitative <5.0 <5 mIU/mL   Comment 3            Comment:   GEST. AGE      CONC.  (mIU/mL)   <=1 WEEK        5 - 50     2 WEEKS       50 - 500     3 WEEKS       100 - 10,000     4 WEEKS     1,000 - 30,000        MALE AND NON-PREGNANT MALE:     LESS THAN 5 mIU/mL   I-Stat Troponin, ED  (not at [facility])     Status: None   Collection Time: 12/06/17 11:38 PM  Result Value Ref Range   Troponin i, poc 0.01 0.00 - 0.08 ng/mL   Comment 3            Comment: Due to the release kinetics of cTnI, a negative result within the first hours of the onset of symptoms does not rule out myocardial infarction with certainty. If myocardial infarction is still suspected, repeat the test at appropriate intervals.    Dg Chest 2 View  Result Date: 12/06/2017 CLINICAL DATA:  Cough and hemoptysis. EXAM: CHEST - 2 VIEW COMPARISON:  12/15/2015 FINDINGS: The heart is mildly enlarged with a left ventricular configuration. The mediastinal and hilar contours appear stable. Low lung volumes with vascular crowding and streaky atelectasis. Stable eventration right hemidiaphragm. No definite infiltrates or effusions. No worrisome pulmonary lesions. The bony thorax is intact. IMPRESSION: Low lung volumes with vascular crowding and atelectasis. No definite infiltrates or effusions. Electronically Signed   By: Marijo Sanes M.D.   On: 12/06/2017 23:03   Ct Angio Chest Pe W And/or Wo Contrast  Result Date: 12/06/2017 CLINICAL DATA:  Acute onset of dyspnea and cough. Angina. EXAM: CT ANGIOGRAPHY CHEST WITH CONTRAST TECHNIQUE: Multidetector CT imaging of the chest was performed using the standard protocol during bolus administration of intravenous contrast. Multiplanar CT image reconstructions and MIPs were obtained to evaluate the vascular anatomy. CONTRAST:  142m ISOVUE-370 IOPAMIDOL (ISOVUE-370) INJECTION 76% COMPARISON:  Chest radiograph performed earlier today at 10:55 p.m. FINDINGS: Cardiovascular: There is no evidence of significant pulmonary embolus. Evaluation for pulmonary embolus is mildly suboptimal due to motion artifact. The heart is borderline normal in size. The thoracic aorta is grossly unremarkable. The great vessels are within normal limits. Mediastinum/Nodes: The mediastinum is grossly unremarkable. No  mediastinal lymphadenopathy is seen. No pericardial effusion is identified. The thyroid gland is unremarkable. No axillary lymphadenopathy  is seen. Lungs/Pleura: The lungs are grossly clear, aside from a few tiny nonspecific pleural based nodular densities on the left, measuring up to 5 mm. A small lymph node is seen along the left major fissure. No pleural effusion or pneumothorax is seen. No dominant masses are identified. Upper Abdomen: The visualized portions of the liver and spleen are unremarkable. The visualized portions of the adrenal glands and kidneys are within normal limits. Musculoskeletal: No acute osseous abnormalities are identified. The visualized musculature is unremarkable in appearance. Review of the MIP images confirms the above findings. IMPRESSION: 1. No evidence of significant pulmonary embolus. 2. Few tiny nonspecific pleural based nodular densities on the left, measuring up to 5 mm. No follow-up needed if patient is low-risk (and has no known or suspected primary neoplasm). Non-contrast chest CT can be considered in 12 months if patient is high-risk. This recommendation follows the consensus statement: Guidelines for Management of Incidental Pulmonary Nodules Detected on CT Images: From the Fleischner Society 2017; Radiology 2017; 284:228-243. Electronically Signed   By: Garald Balding M.D.   On: 12/06/2017 23:50   None  Pending Labs Unresulted Labs (From admission, onward)    Start     Ordered   12/07/17 0532  Protime-INR  Once,   R     12/07/17 0532   17/40/81 4481  Salicylate level  STAT - ONCE,   R     12/07/17 0532   12/07/17 0531  Hepatic function panel  Once,   R     12/07/17 0532   12/07/17 8563  Basic metabolic panel  Once,   R     12/07/17 0532   12/07/17 0531  CBC WITH DIFFERENTIAL  Once,   R     12/07/17 0532   12/07/17 0531  Troponin I Once  Once,   R     12/07/17 0532   12/07/17 0530  HIV antibody (Routine Testing)  Once,   R     12/07/17 0532   12/06/17  2217  ABO/Rh  Once,   R     12/06/17 2217          Vitals/Pain Today's Vitals   12/07/17 0228 12/07/17 0325 12/07/17 0400 12/07/17 0430  BP: (!) 159/89 (!) 149/78 (!) 145/86 (!) 159/96  Pulse: 95 90 89 85  Resp: _0 Temp:  97.9 F (36.6 C)    TempSrc:  Oral    SpO2: 96% 95% 95% (!) 88%  Weight:      Height:      PainSc:        Isolation Precautions No active isolations  Medications Medications  sodium chloride (PF) 0.9 % injection (has no administration in time range)  iopamidol (ISOVUE-370) 76 % injection (has no administration in time range)  acetaminophen (TYLENOL) tablet 650 mg (has no administration in time range)    Or  acetaminophen (TYLENOL) suppository 650 mg (has no administration in time range)  ondansetron (ZOFRAN) tablet 4 mg (has no administration in time range)    Or  ondansetron (ZOFRAN) injection 4 mg (has no administration in time range)  hydrALAZINE (APRESOLINE) injection 5 mg (has no administration in time range)  lisinopril (PRINIVIL,ZESTRIL) tablet 20 mg (has no administration in time range)  hydrochlorothiazide (HYDRODIURIL) tablet 25 mg (has no administration in time range)  iopamidol (ISOVUE-370) 76 % injection 100 mL (100 mLs Intravenous Contrast Given 12/06/17 2339)  ipratropium-albuterol (DUONEB) 0.5-2.5 (3) MG/3ML nebulizer solution 3 mL (3 mLs Nebulization Given  12/07/17 0112)  ipratropium-albuterol (DUONEB) 0.5-2.5 (3) MG/3ML nebulizer solution 3 mL (3 mLs Nebulization Given 12/07/17 0112)    Mobility walks

## 2017-12-08 ENCOUNTER — Observation Stay (HOSPITAL_COMMUNITY): Payer: Self-pay

## 2017-12-08 ENCOUNTER — Inpatient Hospital Stay (HOSPITAL_COMMUNITY): Payer: Self-pay

## 2017-12-08 ENCOUNTER — Encounter (HOSPITAL_COMMUNITY): Payer: Self-pay | Admitting: Pulmonary Disease

## 2017-12-08 DIAGNOSIS — R29818 Other symptoms and signs involving the nervous system: Secondary | ICD-10-CM

## 2017-12-08 DIAGNOSIS — R739 Hyperglycemia, unspecified: Secondary | ICD-10-CM

## 2017-12-08 DIAGNOSIS — Z6841 Body Mass Index (BMI) 40.0 and over, adult: Secondary | ICD-10-CM

## 2017-12-08 DIAGNOSIS — R042 Hemoptysis: Secondary | ICD-10-CM

## 2017-12-08 DIAGNOSIS — I5022 Chronic systolic (congestive) heart failure: Secondary | ICD-10-CM

## 2017-12-08 DIAGNOSIS — J9601 Acute respiratory failure with hypoxia: Principal | ICD-10-CM

## 2017-12-08 DIAGNOSIS — I10 Essential (primary) hypertension: Secondary | ICD-10-CM

## 2017-12-08 DIAGNOSIS — R7303 Prediabetes: Secondary | ICD-10-CM

## 2017-12-08 DIAGNOSIS — I5031 Acute diastolic (congestive) heart failure: Secondary | ICD-10-CM

## 2017-12-08 DIAGNOSIS — I5042 Chronic combined systolic (congestive) and diastolic (congestive) heart failure: Secondary | ICD-10-CM

## 2017-12-08 LAB — COMPREHENSIVE METABOLIC PANEL
ALT: 15 U/L (ref 0–44)
ANION GAP: 9 (ref 5–15)
AST: 16 U/L (ref 15–41)
Albumin: 3.8 g/dL (ref 3.5–5.0)
Alkaline Phosphatase: 50 U/L (ref 38–126)
BILIRUBIN TOTAL: 0.5 mg/dL (ref 0.3–1.2)
BUN: 19 mg/dL (ref 6–20)
CALCIUM: 9.1 mg/dL (ref 8.9–10.3)
CO2: 29 mmol/L (ref 22–32)
Chloride: 102 mmol/L (ref 98–111)
Creatinine, Ser: 0.97 mg/dL (ref 0.61–1.24)
Glucose, Bld: 135 mg/dL — ABNORMAL HIGH (ref 70–99)
Potassium: 3.6 mmol/L (ref 3.5–5.1)
Sodium: 140 mmol/L (ref 135–145)
TOTAL PROTEIN: 7.1 g/dL (ref 6.5–8.1)

## 2017-12-08 LAB — MAGNESIUM: MAGNESIUM: 2.5 mg/dL — AB (ref 1.7–2.4)

## 2017-12-08 LAB — CBC WITH DIFFERENTIAL/PLATELET
Abs Immature Granulocytes: 0.02 10*3/uL (ref 0.00–0.07)
BASOS ABS: 0 10*3/uL (ref 0.0–0.1)
Basophils Relative: 0 %
EOS ABS: 0.2 10*3/uL (ref 0.0–0.5)
EOS PCT: 4 %
HEMATOCRIT: 42.6 % (ref 39.0–52.0)
HEMOGLOBIN: 13.9 g/dL (ref 13.0–17.0)
Immature Granulocytes: 0 %
LYMPHS PCT: 44 %
Lymphs Abs: 2.3 10*3/uL (ref 0.7–4.0)
MCH: 28.7 pg (ref 26.0–34.0)
MCHC: 32.6 g/dL (ref 30.0–36.0)
MCV: 87.8 fL (ref 80.0–100.0)
MONO ABS: 0.5 10*3/uL (ref 0.1–1.0)
Monocytes Relative: 9 %
NRBC: 0 % (ref 0.0–0.2)
Neutro Abs: 2.3 10*3/uL (ref 1.7–7.7)
Neutrophils Relative %: 43 %
Platelets: 208 10*3/uL (ref 150–400)
RBC: 4.85 MIL/uL (ref 4.22–5.81)
RDW: 12.7 % (ref 11.5–15.5)
WBC: 5.3 10*3/uL (ref 4.0–10.5)

## 2017-12-08 LAB — I-STAT BETA HCG BLOOD, ED (NOT ORDERABLE)

## 2017-12-08 LAB — PHOSPHORUS: PHOSPHORUS: 5 mg/dL — AB (ref 2.5–4.6)

## 2017-12-08 IMAGING — DX DG CHEST 1V PORT
1 series · 1 of 1 positions shown · non-contrast
Comparison: Chest radiograph and CTA of the chest performed
[DATE]

CLINICAL DATA: Acute onset of shortness of breath.

EXAM:
PORTABLE CHEST 1 VIEW

[chest ap]
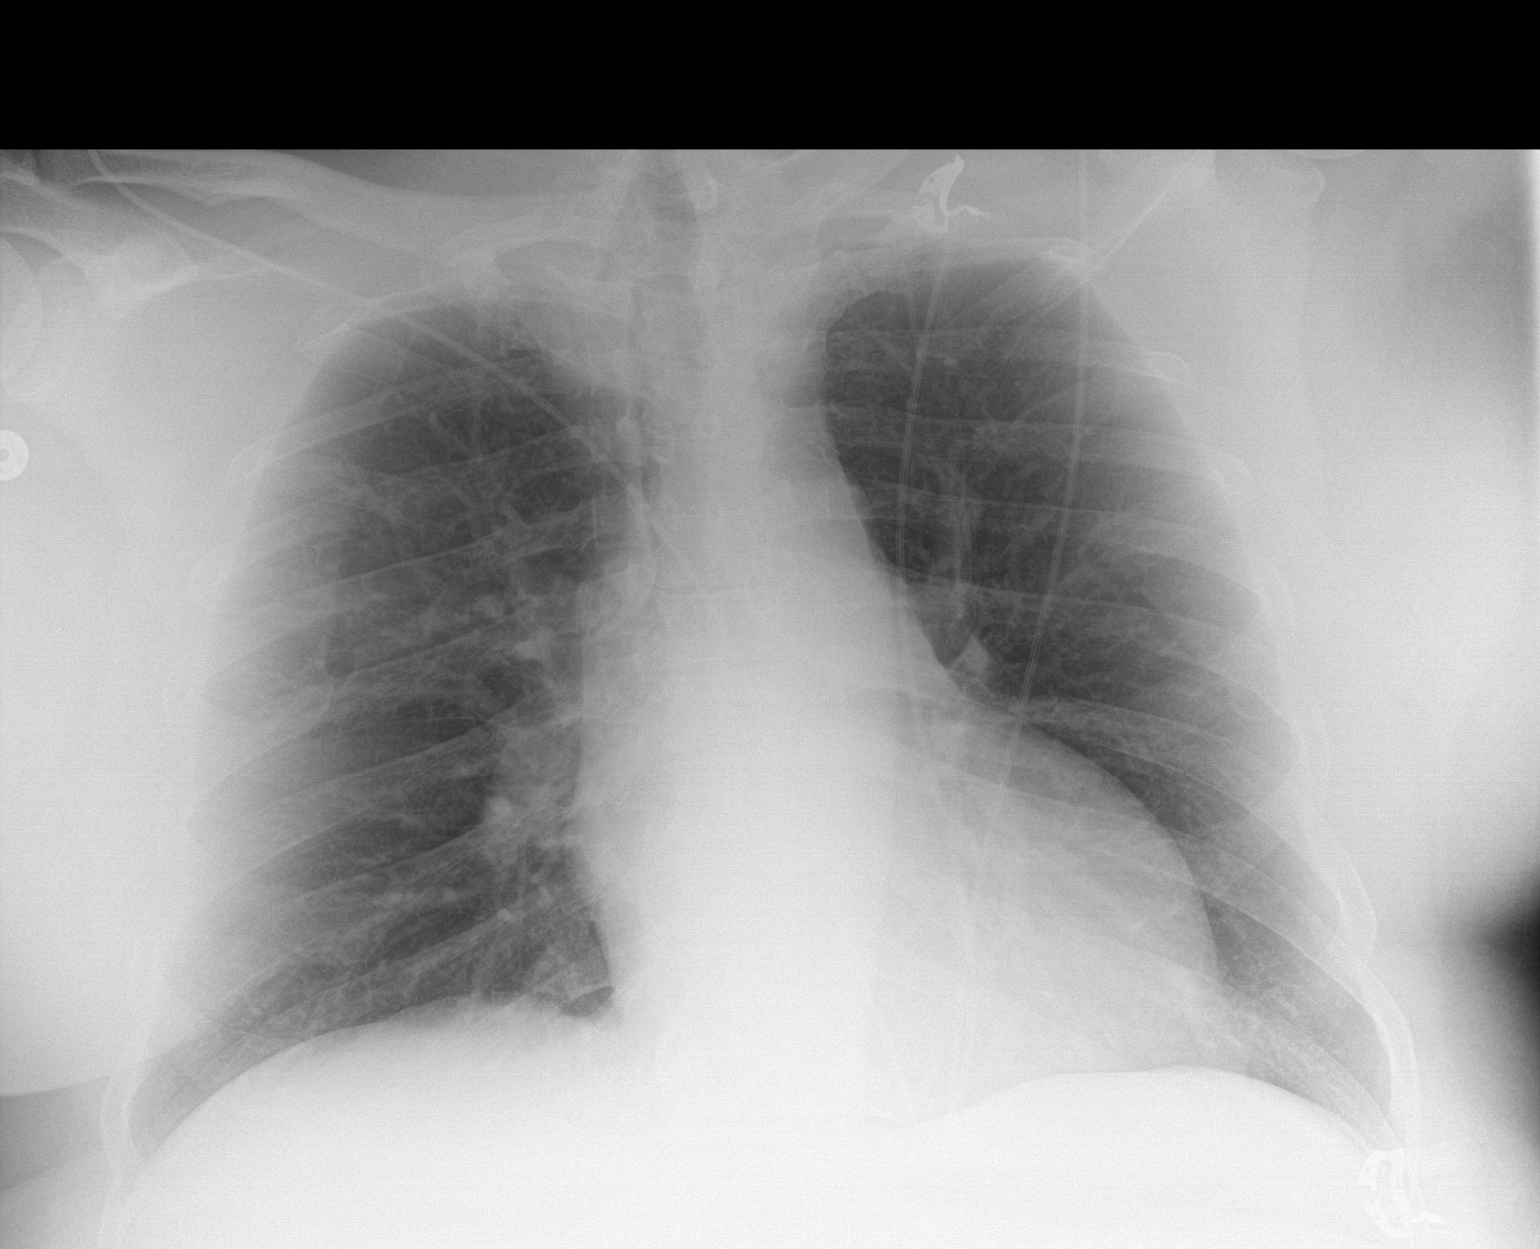

[1 of 1 positions shown; findings below may reference images not displayed]

FINDINGS: The lungs are well-aerated and clear. There is no evidence of focal
opacification, pleural effusion or pneumothorax.

The cardiomediastinal silhouette is within normal limits. No acute
osseous abnormalities are seen.
IMPRESSION: No acute cardiopulmonary process seen.

## 2017-12-08 NOTE — Progress Notes (Signed)
Pt. once had bloody tinged sputum while he coughed.He Is NPO after mid night in case pulmonology order bronchoscopy.

## 2017-12-08 NOTE — Consult Note (Addendum)
NAME:  Randy Morgan, MRN:  [id number], DOB:  11-05-1965, LOS: 0 ADMISSION DATE:  12/06/2017, CONSULTATION DATE:  11/11 REFERRING MD:  Alfredia Ferguson , CHIEF COMPLAINT:  Hemoptysis    Brief History   52 year old smoker, admitted 11/9 with acute emphasis and associated hypoxia pulmonary asked to evaluate  Past Medical History  HTN, obesity, prob sleep apnea, tobacco abuse.  Significant Hospital Events     Consults: date of consult/date signed off & final recs:  Pulmonary 11/11    Procedures (surgical and bedside):    Significant Diagnostic Tests:  CT chest 11/9: neg for PE, few tiny nonspecific pleural-based nodular densities in the left base.  Micro Data:    Antimicrobials:    Subjective:  No distress.  Coughed once last night still with streaky hemoptysis  Objective   Blood pressure 124/82, pulse 92, temperature 97.7 F (36.5 C), temperature source Oral, resp. rate 17, height 5' 6.5" (1.689 m), weight 131.5 kg, SpO2 97 %.        Intake/Output Summary (Last 24 hours) at 12/08/2017 1134 Last data filed at 12/07/2017 1413 Gross per 24 hour  Intake 240 ml  Output no documentation  Net 240 ml   Filed Weights   12/06/17 2206  Weight: 131.5 kg    Examination: General: Pleasant 52 year old male patient resting in bed he is in no acute distress HENT: Normocephalic atraumatic neck is large mucous membranes moist turbulent neck breath sounds Lungs: Diminished bases no accessory Cardiovascular: Regular rate and rhythm Abdomen: Large, nontender, no organomegaly Extremities: Trace lower extremity edema Neuro: Awake alert no focal deficit GU: Voiding quantity sufficient  Resolved Hospital Problem list     Assessment & Plan:   Hemoptysis -Etiology remains unclear.  Differential diagnosis include acute bronchitis, endobronchial lesion, or epistaxis (epistaxis seems unlikely given history), there is no pulmonary edema on chest x-ray, no infiltrates on CT scan which would  argue against heart failure or autoimmune disorder contributing -He does have a history of smoking.  He has had no recent incarceration, had negative TB testing approximately 2 years ago. Plan Will proceed w/ bronchoscopy on 11/12 at 1030 am to r/o endobronchial lesions   Probable sleep apnea  Plan Will f/u as out pt.  Might be able to arrange home PSG    HTN -echo w/ EF 34-74%, gd 1 diastolic dysfxn,  ? PFO of atrial septum Disposition / Summary of Today's Plan 12/08/17   Bronch tomorrow      Diet: NPO after midnight  Pain/Anxiety/Delirium protocol (if indicated): na VAP protocol (if indicated): na DVT prophylaxis: PAS GI prophylaxis: na Hyperglycemia protocol: ssi Mobility: oob Code Status: full code  Family Communication: updated   Labs   CBC: Recent Labs  Lab 12/06/17 2221 12/07/17 0635 12/08/17 0548  WBC 6.0 5.1 5.3  NEUTROABS  --  2.4 2.3  HGB 14.2 13.7 13.9  HCT 43.0 42.3 42.6  MCV 87.2 89.4 87.8  PLT 225 204 259    Basic Metabolic Panel: Recent Labs  Lab 12/06/17 2221 12/07/17 0635 12/08/17 0548  NA 140 139 140  K 3.6 3.6 3.6  CL 101 102 102  CO2 30 29 29   GLUCOSE 119* 127* 135*  BUN 20 22* 19  CREATININE 1.25* 1.07 0.97  CALCIUM 8.9 8.8* 9.1  MG  --  2.5* 2.5*  PHOS  --  5.1* 5.0*   GFR: Estimated Creatinine Clearance: 115.4 mL/min (by C-G formula based on SCr of 0.97 mg/dL). Recent Labs  Lab 12/06/17 2221 12/07/17  2751 12/08/17 0548  WBC 6.0 5.1 5.3    Liver Function Tests: Recent Labs  Lab 12/06/17 2221 12/07/17 0635 12/08/17 0548  AST 20 20 16   ALT 17 17 15   ALKPHOS 54 54 50  BILITOT 0.6 0.9 0.5  PROT 7.4 7.4 7.1  ALBUMIN 4.0 4.2 3.8   No results for input(s): LIPASE, AMYLASE in the last 168 hours. No results for input(s): AMMONIA in the last 168 hours.  ABG No results found for: PHART, PCO2ART, PO2ART, HCO3, TCO2, ACIDBASEDEF, O2SAT   Coagulation Profile: Recent Labs  Lab 12/07/17 0635  INR 0.93    Cardiac  Enzymes: Recent Labs  Lab 12/07/17 0635  TROPONINI <0.03    HbA1C: Hgb A1c MFr Bld  Date/Time Value Ref Range Status  01/30/2016 07:38 PM 5.8 (H) 4.8 - 5.6 % Final    Comment:             Pre-diabetes: 5.7 - 6.4          Diabetes: >6.4          Glycemic control for adults with diabetes: <7.0     CBG: No results for input(s): GLUCAP in the last 168 hours.  Admitting History of Present Illness.   This is a 52 year old male patient with a history of obesity, hypertension, sleep apnea, and tobacco abuse.  Smoked heavily as a teenager, cut back to about 1 pack every 3 days now.  Works as a Furniture conservator/restorer.  Presented on 11/9 with 1 day history of hemoptysis.  Was in his usual health prior to that, denied any sick exposures, nasal congestion, sore throat, epistaxis, chest pain, fever, chills.  Reported started to cough up blood on the evening of 11/8.  He reported the incidence would initiated as developing sudden onset of chest congestion/rattling in his chest followed by large volume of hemoptysis which he estimated at approximately 1/4 cup at a time of dark bloody hemoptysis with clots.  He said that this persisted throughout the evening on 11/8, subsided some the morning of 11/9 and then once again recurred to the point where he was coughing up large volumes of hemoptysis approximately every 5 minutes because of this he presented to the emergency room.  He was admitted for further evaluation, as in the emergency room he is also found to be hypoxic.  A CT chest was obtained this was negative for pulmonary emboli, did have basilar pulmonary nodules but nothing worrisome for tuberculosis changes.  He was hypertensive on presentation.  Therapy has included supplemental oxygen, blood pressure control, and supportive care.  The hemoptysis has improved some over the last 24 hours only having one episode over the evening hours of 11/10.  Pulmonary has been asked to evaluate  Review of Systems:    Review of  Systems - History obtained from the patient General ROS: positive for  - hemoptysis  negative for - chills, fatigue, fever, hot flashes, night sweats, weight gain or weight loss Psychological ROS: negative ENT ROS: positive for - vocal changes negative for - epistaxis, headaches, nasal congestion, nasal discharge, nasal polyps, sinus pain, sneezing or sore throat Allergy and Immunology ROS: positive for - itchy/watery eyes Hematological and Lymphatic ROS: positive for - coughing up clots  Endocrine ROS: negative Respiratory ROS: positive for - cough and hemoptysis Cardiovascular ROS: no chest pain or dyspnea on exertion Gastrointestinal ROS: no abdominal pain, change in bowel habits, or black or bloody stools Genito-Urinary ROS: no dysuria, trouble voiding, or hematuria Musculoskeletal ROS:  negative Neurological ROS: no TIA or stroke symptoms  Past Medical History  He,  has a past medical history of Hypertension.   Surgical History    Past Surgical History:  Procedure Laterality Date  . NO PAST SURGERIES       Social History   Social History   Socioeconomic History  . Marital status: Married    Spouse name: Not on file  . Number of children: 2  . Years of education: 12th grade + 2 years college  . Highest education level: Not on file  Occupational History  . Occupation: makes concrete blocks and bricks  Social Needs  . Financial resource strain: Not on file  . Food insecurity:    Worry: Not on file    Inability: Not on file  . Transportation needs:    Medical: Not on file    Non-medical: Not on file  Tobacco Use  . Smoking status: Current Some Day Smoker    Packs/day: 0.50    Years: 25.00    Pack years: 12.50    Types: Cigarettes  . Smokeless tobacco: Never Used  Substance and Sexual Activity  . Alcohol use: Not Currently    Alcohol/week: 2.0 standard drinks    Types: 2 Cans of beer per week  . Drug use: No  . Sexual activity: Not on file  Lifestyle  .  Physical activity:    Days per week: Not on file    Minutes per session: Not on file  . Stress: Not on file  Relationships  . Social connections:    Talks on phone: Not on file    Gets together: Not on file    Attends religious service: Not on file    Active member of club or organization: Not on file    Attends meetings of clubs or organizations: Not on file    Relationship status: Not on file  . Intimate partner violence:    Fear of current or ex partner: Not on file    Emotionally abused: Not on file    Physically abused: Not on file    Forced sexual activity: Not on file  Other Topics Concern  . Not on file  Social History Narrative   Lives with his wife and daughter.   Other child lives independently in [place].   Drinks about 3 sodas a day   ,  reports that he has been smoking cigarettes. He has a 12.50 pack-year smoking history. He has never used smokeless tobacco. He reports that he drank about 2.0 standard drinks of alcohol per week. He reports that he does not use drugs.   Family History   His family history includes Asthma in his mother and sister; Diabetes in his mother; Heart attack in his brother; Heart disease in his brother, father, and mother.   Allergies No Known Allergies   Home Medications  Prior to Admission medications   Medication Sig Start Date End Date Taking? Authorizing Provider  Aspirin-Salicylamide-Caffeine (BC HEADACHE POWDER PO) Take 1 each by mouth every 8 (eight) hours as needed (pain).   Yes [provider]  albuterol (PROVENTIL HFA;VENTOLIN HFA) 108 (90 Base) MCG/ACT inhaler Inhale 2 puffs into the lungs every 4 (four) hours as needed for wheezing or shortness of breath (cough, shortness of breath or wheezing.). Patient not taking: Reported on 12/07/2017 01/30/16   Harrison Mons, PA  ipratropium (ATROVENT) 0.03 % nasal spray Place 2 sprays into both nostrils 2 (two) times daily. Patient not taking: Reported  on 12/07/2017 02/27/16    Harrison Mons, PA  lisinopril-hydrochlorothiazide (PRINZIDE,ZESTORETIC) 10-12.5 MG tablet Take 2 tablets by mouth daily. Patient not taking: Reported on 12/07/2017 01/30/16   Harrison Mons, PA     Critical care time: [place] ACNP-BC [place] Pager # [phone] OR # [phone] if no answer

## 2017-12-08 NOTE — Progress Notes (Signed)
PROGRESS NOTE    Randy Morgan  SFK:[id number] DOB: 07-Jun-1965 DOA: 12/06/2017 PCP: Patient, No Pcp Per  Brief Narrative:  The patient is a 52 year old morbidly obese African-American male with a past medical history significant for hypertension, and tobacco abuse who presented to [facility] emergency room with a chief complaint of shortness of breath and coughing up blood.  He has been taking Goody powder for his low back pain and has not noticed any blood in his stools or vomit but states that he has had some significant hemoptysis.    In the ED he was noted to be hypoxic requiring oxygen and given his hypoxia and hemoptysis he was admitted for further observation and evaluation for acute respiratory failure with hypoxia and hemoptysis. Patient underwent a CTA of the chest to rule out PE and showed no evidence of significant pulmonary embolus.  There is a few tiny nonspecific pleural-based nodular density in the left measuring up to 5 mm and a small lymph node that was seen in the left major fissure.  There is no pleural effusion or pneumothorax seen in the lungs are grossly clear aside from the nodular nonspecific pleural-based densities.  Pulmonary was consulted for further evaluation and recommendations and are planning on doing a bronchoscopy in the a.m.  Assessment & Plan:   Active Problems:   BMI 45.0-49.9, adult (HCC)   Essential hypertension, benign   Smoker   Acute respiratory failure with hypoxia (HCC)   Hemoptysis   Acute respiratory failure (HCC)   Hyperglycemia   Pre-diabetes   Suspected sleep apnea   Acute diastolic CHF (congestive heart failure) (HCC)   Hypermagnesemia   Hyperphosphatemia  Acute Respiratory Failure with Hypoxia associated with Hemoptysis -Was able to wean off of oxygen and saturating on room air -Has no signs or risks factors for tuberculosis -CTA of the chest showed no evidence of any significant pulmonary embolus.  There is few tiny nonspecific  pleural-based nodular densities on the left side measuring up to 5 mm -Repeat chest x-ray this a.m. showed lungs are well aerated and clear.  There is no evidence of any focal opacification pleural effusion thorax.  Cardiomediastinal silhouette was within normal limits and no acute osseous abnormalities are seen -Continue as pulse oximetry and maintain O2 saturation greater than 90% -Continue supplemental oxygen via nasal cannula and wean O2 as tolerated as necessary; patient is no longer wearing oxygen and is on room air -Repeat chest x-ray in a.m. -Not on any nebs -Pulmonary following and appreciate further evaluation recommendations  Hemoptysis -Improving and patient is currently hemodynamically stable -Patient's hemoglobin/hematocrit went from 14.2/43.0 -> 13.7/42.3 -> 13.9/42.6 -Continue to monitor for signs and symptoms of bleeding -Repeat CBC in a.m.   Hyperphosphatemia -Patient's phosphorus level was 5.0 -Continue monitor and repeat phosphorus level in a.m.  Hypermagnesemia -Patient magnesium level was 2.5 -Continue monitor repeat magnesium level in a.m.  Hypertension -Has a history of noncompliance after changing jobs--resume home medications lisinopril 20 minutes p.o. daily along with hydrochlorothiazide 25 mg p.o. daily -Continue with IV hydralazine 5 mg IV every 4 PRN for systolic blood pressure greater than 160  Mild Acute Grade 1 Diastolic CHF -Continue home hydrochlorothiazide -Has 1+ LE edema  -Echocardiogram showed systolic function with a normal EF of 55 to 60% with normal wall motion and no regional wall motion abnormalities.  Doppler parameters were consistent with abnormal left ventricular relaxation and showed grade 1 diastolic dysfunction -BNP was 24.3 but he is severely morbidly obese -Strict I's  and O's and daily weights; fluid restrict to 1500 mL -Elevate extremities -Continue monitor volume status extremely carefully  ?PFO/ASD -Obtaining Bubble study for  further evaluation  Tobacco Abuse -Smoking Cessation counseling given  Morbid Obesity -Estimated body mass index is 46.11 kg/m as calculated from the following:   Height as of this encounter: 5' 6.5" (1.689 m).   Weight as of this encounter: 131.5 kg. -Weight Loss and Dietary Counseling Given  Hyperglycemia in the setting of Pre-Diabetes -Patient's Blood Sugar on BMP/CMP has been ranging from 119-135 -Check HbA1c; Last HbA1c was 2 years ago and was 5.8 -Continue to Monitor Blood Sugars and if remains continually elevated will place on Sensitive Novolog SSI  Suspected Obstructive Sleep Apnea -We will need outpatient sleep study and pulmonary arranging follow-up as an outpatient  DVT prophylaxis: SCDs  Code Status: FULL CODE Family Communication: Discussed with wife at bedside Disposition Plan: Remain Inpatient for Bronchoscopy in AM   Consultants:   PCCM/Pulmonary   Procedures:  Bronchoscopy in AM scheduled for 10:30 AM   Antimicrobials:  Anti-infectives (From admission, onward)   None     Subjective: Seen and examined at bedside patient was sitting up in the chair with no complaints.  No chest pain, lightheadedness or dizziness.  States he had one episode of hemoptysis which was treated last night.  States he is feeling better.  No nausea or vomiting.  No other concerns or complaints at this time  Objective: Vitals:   12/07/17 1600 12/07/17 2047 12/08/17 0501 12/08/17 1442  BP:  130/77 124/82 126/75  Pulse: 90 87 92 96  Resp:  18 17 16   Temp:  98.2 F (36.8 C) 97.7 F (36.5 C) 98.4 F (36.9 C)  TempSrc:  Oral Oral Oral  SpO2: 94% 95% 97% 100%  Weight:      Height:       No intake or output data in the 24 hours ending 12/08/17 1549 Filed Weights   12/06/17 2206  Weight: 131.5 kg   Examination: Physical Exam:  Constitutional: WN/WD morbidly obese AAM in NAD and appears calm and comfortable Eyes: Lids and conjunctivae normal, sclerae anicteric  ENMT:  External Ears, Nose appear normal. Grossly normal hearing.  Neck: Appears normal, supple, no cervical masses, normal ROM, no appreciable thyromegaly; no JVD Respiratory: Diminished to auscultation bilaterally, no wheezing, rales, rhonchi or crackles. Normal respiratory effort and patient is not tachypenic. No accessory muscle use.  Not Wearing supplemental oxygen via nasal cannula Cardiovascular: RRR, no murmurs / rubs / gallops. S1 and S2 auscultated. 1+ LE extremity edema. Abdomen: Soft, non-tender, non-distended. No masses palpated. No appreciable hepatosplenomegaly. Bowel sounds positive x4.  GU: Deferred. Musculoskeletal: No clubbing / cyanosis of digits/nails. Normal strength and muscle tone.  Skin: No rashes, lesions, ulcers. No induration; Warm and dry.  Neurologic: CN 2-12 grossly intact with no focal deficits. Romberg sign and cerebellar reflexes not assessed.  Psychiatric: Normal judgment and insight. Alert and oriented x 3. Normal mood and appropriate affect.   Data Reviewed: I have personally reviewed following labs and imaging studies  CBC: Recent Labs  Lab 12/06/17 2221 12/07/17 0635 12/08/17 0548  WBC 6.0 5.1 5.3  NEUTROABS  --  2.4 2.3  HGB 14.2 13.7 13.9  HCT 43.0 42.3 42.6  MCV 87.2 89.4 87.8  PLT 225 204 810   Basic Metabolic Panel: Recent Labs  Lab 12/06/17 2221 12/07/17 0635 12/08/17 0548  NA 140 139 140  K 3.6 3.6 3.6  CL 101 102  102  CO2 30 29 29   GLUCOSE 119* 127* 135*  BUN 20 22* 19  CREATININE 1.25* 1.07 0.97  CALCIUM 8.9 8.8* 9.1  MG  --  2.5* 2.5*  PHOS  --  5.1* 5.0*   GFR: Estimated Creatinine Clearance: 115.4 mL/min (by C-G formula based on SCr of 0.97 mg/dL). Liver Function Tests: Recent Labs  Lab 12/06/17 2221 12/07/17 0635 12/08/17 0548  AST 20 20 16   ALT 17 17 15   ALKPHOS 54 54 50  BILITOT 0.6 0.9 0.5  PROT 7.4 7.4 7.1  ALBUMIN 4.0 4.2 3.8   No results for input(s): LIPASE, AMYLASE in the last 168 hours. No results for  input(s): AMMONIA in the last 168 hours. Coagulation Profile: Recent Labs  Lab 12/07/17 0635  INR 0.93   Cardiac Enzymes: Recent Labs  Lab 12/07/17 0635  TROPONINI <0.03   BNP (last 3 results) No results for input(s): PROBNP in the last 8760 hours. HbA1C: No results for input(s): HGBA1C in the last 72 hours. CBG: No results for input(s): GLUCAP in the last 168 hours. Lipid Profile: No results for input(s): CHOL, HDL, LDLCALC, TRIG, CHOLHDL, LDLDIRECT in the last 72 hours. Thyroid Function Tests: No results for input(s): TSH, T4TOTAL, FREET4, T3FREE, THYROIDAB in the last 72 hours. Anemia Panel: No results for input(s): VITAMINB12, FOLATE, FERRITIN, TIBC, IRON, RETICCTPCT in the last 72 hours. Sepsis Labs: No results for input(s): PROCALCITON, LATICACIDVEN in the last 168 hours.  No results found for this or any previous visit (from the past 240 hour(s)).       Radiology Studies: Dg Chest 2 View  Result Date: 12/06/2017 CLINICAL DATA:  Cough and hemoptysis. EXAM: CHEST - 2 VIEW COMPARISON:  12/15/2015 FINDINGS: The heart is mildly enlarged with a left ventricular configuration. The mediastinal and hilar contours appear stable. Low lung volumes with vascular crowding and streaky atelectasis. Stable eventration right hemidiaphragm. No definite infiltrates or effusions. No worrisome pulmonary lesions. The bony thorax is intact. IMPRESSION: Low lung volumes with vascular crowding and atelectasis. No definite infiltrates or effusions. Electronically Signed   By: Marijo Sanes M.D.   On: 12/06/2017 23:03   Ct Angio Chest Pe W And/or Wo Contrast  Result Date: 12/06/2017 CLINICAL DATA:  Acute onset of dyspnea and cough. Angina. EXAM: CT ANGIOGRAPHY CHEST WITH CONTRAST TECHNIQUE: Multidetector CT imaging of the chest was performed using the standard protocol during bolus administration of intravenous contrast. Multiplanar CT image reconstructions and MIPs were obtained to evaluate the  vascular anatomy. CONTRAST:  153mL ISOVUE-370 IOPAMIDOL (ISOVUE-370) INJECTION 76% COMPARISON:  Chest radiograph performed earlier today at 10:55 p.m. FINDINGS: Cardiovascular: There is no evidence of significant pulmonary embolus. Evaluation for pulmonary embolus is mildly suboptimal due to motion artifact. The heart is borderline normal in size. The thoracic aorta is grossly unremarkable. The great vessels are within normal limits. Mediastinum/Nodes: The mediastinum is grossly unremarkable. No mediastinal lymphadenopathy is seen. No pericardial effusion is identified. The thyroid gland is unremarkable. No axillary lymphadenopathy is seen. Lungs/Pleura: The lungs are grossly clear, aside from a few tiny nonspecific pleural based nodular densities on the left, measuring up to 5 mm. A small lymph node is seen along the left major fissure. No pleural effusion or pneumothorax is seen. No dominant masses are identified. Upper Abdomen: The visualized portions of the liver and spleen are unremarkable. The visualized portions of the adrenal glands and kidneys are within normal limits. Musculoskeletal: No acute osseous abnormalities are identified. The visualized musculature is unremarkable  in appearance. Review of the MIP images confirms the above findings. IMPRESSION: 1. No evidence of significant pulmonary embolus. 2. Few tiny nonspecific pleural based nodular densities on the left, measuring up to 5 mm. No follow-up needed if patient is low-risk (and has no known or suspected primary neoplasm). Non-contrast chest CT can be considered in 12 months if patient is high-risk. This recommendation follows the consensus statement: Guidelines for Management of Incidental Pulmonary Nodules Detected on CT Images: From the Fleischner Society 2017; Radiology 2017; 284:228-243. Electronically Signed   By: Garald Balding M.D.   On: 12/06/2017 23:50   Dg Chest Port 1 View  Result Date: 12/08/2017 CLINICAL DATA:  Acute onset of  shortness of breath. EXAM: PORTABLE CHEST 1 VIEW COMPARISON:  Chest radiograph and CTA of the chest performed 12/06/2017 FINDINGS: The lungs are well-aerated and clear. There is no evidence of focal opacification, pleural effusion or pneumothorax. The cardiomediastinal silhouette is within normal limits. No acute osseous abnormalities are seen. IMPRESSION: No acute cardiopulmonary process seen. Electronically Signed   By: Garald Balding M.D.   On: 12/08/2017 04:58   Scheduled Meds: . hydrochlorothiazide  25 mg Oral Daily  . lisinopril  20 mg Oral Daily   Continuous Infusions:   LOS: 0 days   Kerney Elbe, DO [facility] PAGER is on AMION  If 7PM-7AM, please contact night-coverage [email or web address] Password [facility] 12/08/2017, 3:49 PM

## 2017-12-09 ENCOUNTER — Encounter (HOSPITAL_COMMUNITY)
Admission: EM | Disposition: A | Discharge status: Home Health | Payer: Self-pay | Source: Home / Self Care | Attending: Internal Medicine

## 2017-12-09 ENCOUNTER — Inpatient Hospital Stay (HOSPITAL_COMMUNITY): Payer: Self-pay

## 2017-12-09 DIAGNOSIS — R739 Hyperglycemia, unspecified: Secondary | ICD-10-CM

## 2017-12-09 DIAGNOSIS — R7303 Prediabetes: Secondary | ICD-10-CM

## 2017-12-09 DIAGNOSIS — I5031 Acute diastolic (congestive) heart failure: Secondary | ICD-10-CM

## 2017-12-09 DIAGNOSIS — F17200 Nicotine dependence, unspecified, uncomplicated: Secondary | ICD-10-CM

## 2017-12-09 DIAGNOSIS — I10 Essential (primary) hypertension: Secondary | ICD-10-CM

## 2017-12-09 DIAGNOSIS — R29818 Other symptoms and signs involving the nervous system: Secondary | ICD-10-CM

## 2017-12-09 HISTORY — PX: VIDEO BRONCHOSCOPY: SHX5072

## 2017-12-09 LAB — CBC WITH DIFFERENTIAL/PLATELET
ABS IMMATURE GRANULOCYTES: 0.02 10*3/uL (ref 0.00–0.07)
Basophils Absolute: 0 10*3/uL (ref 0.0–0.1)
Basophils Relative: 1 %
Eosinophils Absolute: 0.2 10*3/uL (ref 0.0–0.5)
Eosinophils Relative: 3 %
HCT: 41 % (ref 39.0–52.0)
HEMOGLOBIN: 13.3 g/dL (ref 13.0–17.0)
Immature Granulocytes: 0 %
LYMPHS ABS: 2.3 10*3/uL (ref 0.7–4.0)
LYMPHS PCT: 45 %
MCH: 28.9 pg (ref 26.0–34.0)
MCHC: 32.4 g/dL (ref 30.0–36.0)
MCV: 88.9 fL (ref 80.0–100.0)
MONO ABS: 0.5 10*3/uL (ref 0.1–1.0)
Monocytes Relative: 10 %
NEUTROS ABS: 2.1 10*3/uL (ref 1.7–7.7)
Neutrophils Relative %: 41 %
Platelets: 199 10*3/uL (ref 150–400)
RBC: 4.61 MIL/uL (ref 4.22–5.81)
RDW: 12.7 % (ref 11.5–15.5)
WBC: 5.1 10*3/uL (ref 4.0–10.5)
nRBC: 0 % (ref 0.0–0.2)

## 2017-12-09 LAB — COMPREHENSIVE METABOLIC PANEL
ALBUMIN: 3.6 g/dL (ref 3.5–5.0)
ALT: 14 U/L (ref 0–44)
AST: 15 U/L (ref 15–41)
Alkaline Phosphatase: 45 U/L (ref 38–126)
Anion gap: 7 (ref 5–15)
BUN: 18 mg/dL (ref 6–20)
CO2: 33 mmol/L — AB (ref 22–32)
Calcium: 8.8 mg/dL — ABNORMAL LOW (ref 8.9–10.3)
Chloride: 101 mmol/L (ref 98–111)
Creatinine, Ser: 0.97 mg/dL (ref 0.61–1.24)
GFR calc Af Amer: >60 mL/min (ref >60)
GFR calc non Af Amer: >60 mL/min (ref >60)
GLUCOSE: 118 mg/dL — AB (ref 70–99)
POTASSIUM: 3.6 mmol/L (ref 3.5–5.1)
Sodium: 141 mmol/L (ref 135–145)
Total Bilirubin: 0.9 mg/dL (ref 0.3–1.2)
Total Protein: 6.9 g/dL (ref 6.5–8.1)

## 2017-12-09 LAB — HEMOGLOBIN A1C
Hgb A1c MFr Bld: 5.6 % (ref 4.8–5.6)
Mean Plasma Glucose: 114.02 mg/dL

## 2017-12-09 LAB — PHOSPHORUS: Phosphorus: 5 mg/dL — ABNORMAL HIGH (ref 2.5–4.6)

## 2017-12-09 LAB — MAGNESIUM: MAGNESIUM: 2.4 mg/dL (ref 1.7–2.4)

## 2017-12-09 IMAGING — DX DG CHEST 1V PORT
1 series · 1 of 1 positions shown · non-contrast
Comparison: [DATE]

CLINICAL DATA: Hypoxia

EXAM:
PORTABLE CHEST 1 VIEW

[chest ap]
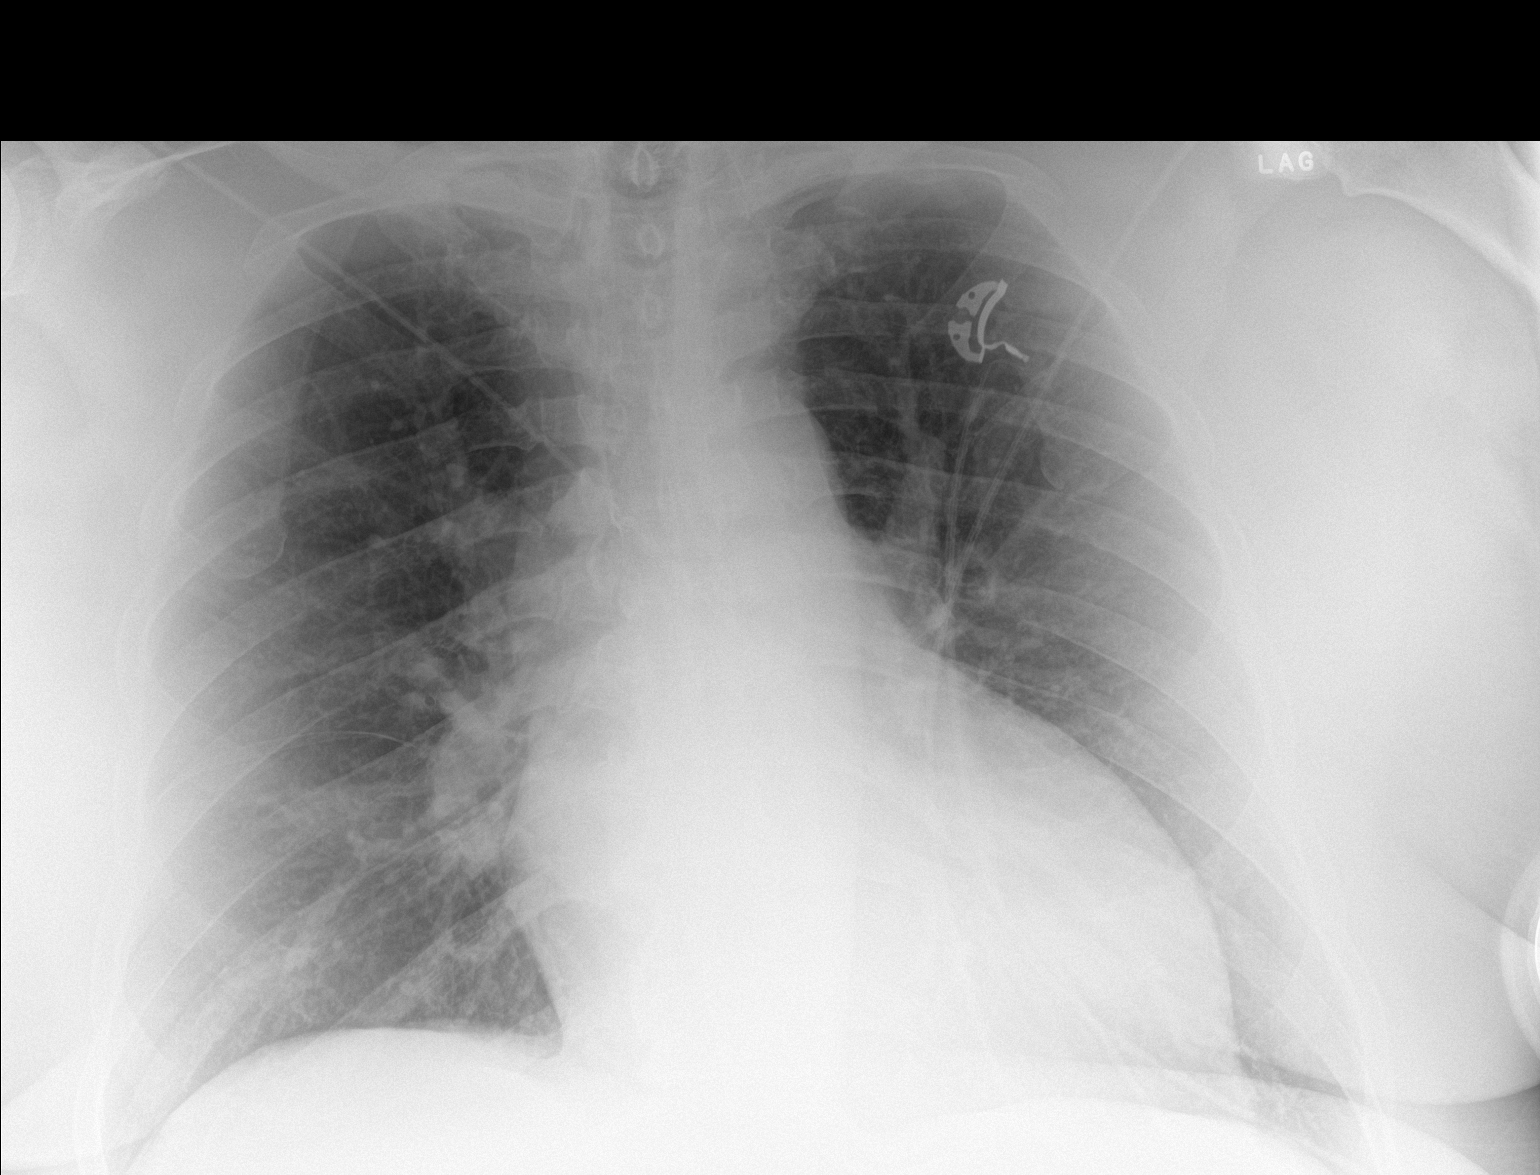

[1 of 1 positions shown; findings below may reference images not displayed]

FINDINGS: Cardiac shadow remains enlarged but accentuated by the portable
technique. The lungs are well aerated bilaterally. No focal
infiltrate or sizable effusion is seen. No bony abnormality is
noted.
IMPRESSION: No acute abnormality seen.

## 2017-12-09 SURGERY — VIDEO BRONCHOSCOPY WITHOUT FLUORO
Anesthesia: Moderate Sedation | Laterality: Bilateral

## 2017-12-09 MED ORDER — LIDOCAINE HCL URETHRAL/MUCOSAL 2 % EX GEL
CUTANEOUS | Status: DC | PRN
  Administered 2017-12-09: 1

## 2017-12-09 MED ORDER — LISINOPRIL 20 MG PO TABS: 20.0000 mg | ORAL_TABLET | Freq: Every day | ORAL | 0 refills | Status: DC

## 2017-12-09 MED ORDER — LIDOCAINE HCL 1 % IJ SOLN
INTRAMUSCULAR | Status: DC | PRN
  Administered 2017-12-09: 6 mL via RESPIRATORY_TRACT

## 2017-12-09 MED ORDER — PREDNISONE 20 MG PO TABS
20.0000 mg | ORAL_TABLET | Freq: Two times a day (BID) | ORAL | Status: DC
  Filled 2017-12-09: qty 1

## 2017-12-09 MED ORDER — HYDROCHLOROTHIAZIDE 25 MG PO TABS: 25.0000 mg | ORAL_TABLET | Freq: Every day | ORAL | 0 refills | Status: DC

## 2017-12-09 MED ORDER — ALBUTEROL SULFATE HFA 108 (90 BASE) MCG/ACT IN AERS: 2.0000 | INHALATION_SPRAY | RESPIRATORY_TRACT | 1 refills | Status: DC | PRN

## 2017-12-09 MED ORDER — LIDOCAINE HCL 2 % EX GEL
1.0000 "application " | Freq: Once | CUTANEOUS | Status: DC
  Filled 2017-12-09: qty 1

## 2017-12-09 MED ORDER — FENTANYL CITRATE (PF) 100 MCG/2ML IJ SOLN
INTRAMUSCULAR | Status: AC
  Filled 2017-12-09: qty 4

## 2017-12-09 MED ORDER — IPRATROPIUM BROMIDE 0.03 % NA SOLN: 2.0000 | Freq: Two times a day (BID) | NASAL | 0 refills | Status: DC

## 2017-12-09 MED ORDER — PREDNISONE 20 MG PO TABS: 20.0000 mg | ORAL_TABLET | Freq: Two times a day (BID) | ORAL | 0 refills | Status: DC

## 2017-12-09 MED ORDER — MIDAZOLAM HCL (PF) 5 MG/ML IJ SOLN
INTRAMUSCULAR | Status: AC
  Filled 2017-12-09: qty 2

## 2017-12-09 MED ORDER — PHENYLEPHRINE HCL 0.25 % NA SOLN: 1.0000 | Freq: Four times a day (QID) | NASAL | Status: DC | PRN

## 2017-12-09 MED ORDER — SODIUM CHLORIDE 0.9 % IV SOLN
INTRAVENOUS | Status: DC
  Administered 2017-12-09: 15:00:00 via INTRAVENOUS

## 2017-12-09 MED ORDER — MIDAZOLAM HCL (PF) 10 MG/2ML IJ SOLN
INTRAMUSCULAR | Status: DC | PRN
  Administered 2017-12-09 (×2): 2 mg via INTRAVENOUS

## 2017-12-09 MED ORDER — FENTANYL CITRATE (PF) 100 MCG/2ML IJ SOLN
INTRAMUSCULAR | Status: DC | PRN
  Administered 2017-12-09: 50 ug via INTRAVENOUS

## 2017-12-09 MED ORDER — PHENYLEPHRINE HCL 0.25 % NA SOLN
NASAL | Status: DC | PRN
  Administered 2017-12-09: 2 via NASAL

## 2017-12-09 NOTE — Progress Notes (Signed)
Medications wasted after procedure with Dr. Ander Slade witnessed 1 mg versed And 50 mcg of fentanyl in sink post procedure.

## 2017-12-09 NOTE — H&P (View-Only) (Signed)
NAME:  Randy Morgan, MRN:  [id number], DOB:  [birth year], LOS: 1 ADMISSION DATE:  12/06/2017, CONSULTATION DATE: 11/11 2019 REFERRING MD: Alfredia Ferguson, CHIEF COMPLAINT: Hemoptysis  Brief History   52 year old smoker, admitted 11/ 9 with hemoptysis-moderate to severe hemoptysis Did have hypoxia History of present illness   Patient had 3 to 4 days of persistent expectorations of bright red blood He did have a cough prior to expectoration of blood No weight loss No fevers Appetite has been maintained  Past Medical History  Hypertension, obesity, tobacco abuse Significant Hospital Events      Significant Diagnostic Tests:  CT scan of the chest 11/9-tiny non specific pleural-based nodules   Interim history/subjective:  Denies any chest pains or chest discomfort Still has a streak of blood in his phlegm otherwise not coughing very actively at present  Objective   Blood pressure 138/88, pulse 91, temperature 97.8 F (36.6 C), temperature source Oral, resp. rate 16, height 5' 6.5" (1.689 m), weight 131.5 kg, SpO2 98 %.        Intake/Output Summary (Last 24 hours) at 12/09/2017 1113 Last data filed at 12/09/2017 0845 Gross per 24 hour  Intake 240 ml  Output -  Net 240 ml   Filed Weights   12/06/17 2206  Weight: 131.5 kg    Examination: General: Pleasant 52 year old male patient in no acute distress HENT: Normocephalic, atraumatic Lungs: Diminished at the bases bilaterally Cardiovascular: S1-S2 appreciated Abdomen: Bowel sounds appreciated Extremities: Trace lower extremity edema Neuro: Awake alert oriented x3 GU: Good urine output   Assessment & Plan:  Hemoptysis -Etiology is unclear at the present time May be secondary to a bronchitis Risk of endobronchial lesion-we will plan for bronchoscopy today CT scan of the chest is unrevealing, small lung nodule-unlikely to be of any significance  He does have an active smoking history-smoking cessation counseling  High  probability of significant sleep disordered breathing -We will need further work-up as outpatient  Hypertension -Continue lines of care  Best practice:  Diet: N.p.o. for bronchoscopy Mobility: As tolerated Code Status: Full code Family Communication: Spoke with spouse at bedside Disposition: Possibly home today following bronchoscopy  Labs   CBC: Recent Labs  Lab 12/06/17 2221 12/07/17 0635 12/08/17 0548 12/09/17 0631  WBC 6.0 5.1 5.3 5.1  NEUTROABS  --  2.4 2.3 2.1  HGB 14.2 13.7 13.9 13.3  HCT 43.0 42.3 42.6 41.0  MCV 87.2 89.4 87.8 88.9  PLT 225 204 208 941    Basic Metabolic Panel: Recent Labs  Lab 12/06/17 2221 12/07/17 0635 12/08/17 0548 12/09/17 0631  NA 140 139 140 141  K 3.6 3.6 3.6 3.6  CL 101 102 102 101  CO2 30 29 29  33*  GLUCOSE 119* 127* 135* 118*  BUN 20 22* 19 18  CREATININE 1.25* 1.07 0.97 0.97  CALCIUM 8.9 8.8* 9.1 8.8*  MG  --  2.5* 2.5* 2.4  PHOS  --  5.1* 5.0* 5.0*   GFR: Estimated Creatinine Clearance: 115.4 mL/min (by C-G formula based on SCr of 0.97 mg/dL). Recent Labs  Lab 12/06/17 2221 12/07/17 0635 12/08/17 0548 12/09/17 0631  WBC 6.0 5.1 5.3 5.1    Liver Function Tests: Recent Labs  Lab 12/06/17 2221 12/07/17 0635 12/08/17 0548 12/09/17 0631  AST 20 20 16 15   ALT 17 17 15 14   ALKPHOS 54 54 50 45  BILITOT 0.6 0.9 0.5 0.9  PROT 7.4 7.4 7.1 6.9  ALBUMIN 4.0 4.2 3.8 3.6   No results for input(s):  LIPASE, AMYLASE in the last 168 hours. No results for input(s): AMMONIA in the last 168 hours.  ABG No results found for: PHART, PCO2ART, PO2ART, HCO3, TCO2, ACIDBASEDEF, O2SAT   Coagulation Profile: Recent Labs  Lab 12/07/17 0635  INR 0.93    Cardiac Enzymes: Recent Labs  Lab 12/07/17 0635  TROPONINI <0.03    HbA1C: Hgb A1c MFr Bld  Date/Time Value Ref Range Status  12/09/2017 06:31 AM 5.6 4.8 - 5.6 % Final    Comment:    (NOTE) Pre diabetes:          5.7%-6.4% Diabetes:              >6.4% Glycemic  control for   <7.0% adults with diabetes   01/30/2016 07:38 PM 5.8 (H) 4.8 - 5.6 % Final    Comment:             Pre-diabetes: 5.7 - 6.4          Diabetes: >6.4          Glycemic control for adults with diabetes: <7.0     CBG: No results for input(s): GLUCAP in the last 168 hours.  Review of Systems:   Review of Systems  Constitutional: Negative for fever.  HENT: Negative.   Eyes: Negative.   Respiratory: Positive for cough and hemoptysis.   Cardiovascular: Negative.   Gastrointestinal: Negative.   Genitourinary: Negative.   Musculoskeletal: Negative.   Skin: Negative.   Psychiatric/Behavioral: Negative.      Past Medical History  He,  has a past medical history of Hypertension.   Surgical History    Past Surgical History:  Procedure Laterality Date  . NO PAST SURGERIES       Social History   reports that he has been smoking cigarettes. He has a 12.50 pack-year smoking history. He has never used smokeless tobacco. He reports that he drank about 2.0 standard drinks of alcohol per week. He reports that he does not use drugs.   Family History   His family history includes Asthma in his mother and sister; Diabetes in his mother; Heart attack in his brother; Heart disease in his brother, father, and mother.   Allergies No Known Allergies   Home Medications  Prior to Admission medications   Medication Sig Start Date End Date Taking? Authorizing Provider  Aspirin-Salicylamide-Caffeine (BC HEADACHE POWDER PO) Take 1 each by mouth every 8 (eight) hours as needed (pain).   Yes [provider]  albuterol (PROVENTIL HFA;VENTOLIN HFA) 108 (90 Base) MCG/ACT inhaler Inhale 2 puffs into the lungs every 4 (four) hours as needed for wheezing or shortness of breath (cough, shortness of breath or wheezing.). Patient not taking: Reported on 12/07/2017 01/30/16   Harrison Mons, PA  ipratropium (ATROVENT) 0.03 % nasal spray Place 2 sprays into both nostrils 2 (two) times  daily. Patient not taking: Reported on 12/07/2017 02/27/16   Harrison Mons, PA  lisinopril-hydrochlorothiazide (PRINZIDE,ZESTORETIC) 10-12.5 MG tablet Take 2 tablets by mouth daily. Patient not taking: Reported on 12/07/2017 01/30/16   Harrison Mons, PA     For bronchoscopy this p.m.

## 2017-12-09 NOTE — Interval H&P Note (Signed)
History and Physical Interval Note:  12/09/2017 3:12 PM  Randy Morgan  has presented today for surgery, with the diagnosis of Hemopytsis, airway inspect  The various methods of treatment have been discussed with the patient and family. After consideration of risks, benefits and other options for treatment, the patient has consented to  Procedure(s): VIDEO BRONCHOSCOPY WITHOUT FLUORO (Bilateral) as a surgical intervention .  The patient's history has been reviewed, patient examined, no change in status, stable for surgery.  I have reviewed the patient's chart and labs.  Questions were answered to the patient's satisfaction.     Adewale A Olalere

## 2017-12-09 NOTE — Discharge Summary (Signed)
Physician Discharge Summary  Nancy Manuele JME:[id number] DOB: [birth year] DOA: 12/06/2017  PCP: Patient, No Pcp Per  Admit date: 12/06/2017 Discharge date: 12/09/2017  Admitted From: Home Disposition: Home with Home Health  Recommendations for Outpatient Follow-up:  1. Follow up with PCP in 1-2 weeks 2. Follow up with Pulmonary as an outpatient for Follow up and Sleep Study 3. Follow up with Cardiology for Diastolic CHF 4. Follow up with ENT for Nasal Bogginess/Rhinitis in the Left Nasal Passage 5. Please obtain CMP/CBC, Mag, Phos in one week 6. Please follow up on the following pending results:  Home Health: No Equipment/Devices: None   Discharge Condition: Stable CODE STATUS: FULL CODE Diet recommendation: Heart Healthy Diet  Brief/Interim Summary: The patient is a 52 year old morbidly obese African-American male with a past medical history significant for hypertension, and tobacco abuse who presented to [facility] emergency room with a chief complaint of shortness of breath and coughing up blood. He has been taking Goody powder for his low back pain and has not noticed any blood in his stools or vomit but states that he has had some significant hemoptysis.   In the ED he was noted to be hypoxic requiring oxygen and given his hypoxia and hemoptysis he was admitted for further observation and evaluation for acute respiratory failure with hypoxia and hemoptysis. Patient underwent a CTA of the chest to rule out PE and showed no evidence of significant pulmonary embolus. There is a few tiny nonspecific pleural-based nodular density in the left measuring up to 5 mm and a small lymph node that was seen in the left major fissure. There is no pleural effusion or pneumothorax seen in the lungs are grossly clear aside from the nodular nonspecific pleural-based densities.  Pulmonary was consulted for further evaluation and recommendations and did a endoscopy which was significant for  bogginess noted in the left nares likely secondary to chronic rhinitis and there is also bleeding noted in the right upper lobe airway was washed out and not actively bleeding after washing.  Is suspected that the bleeding and hemoptysis came from a bronchitis and patient was placed on a course of steroid 20 mg p.o. prednisone for.  He will need to follow-up with PCP as well as pulmonary in outpatient setting.  He was deemed medically stable from pulmonary standpoint to be discharged home and he will follow-up with them as an outpatient.  Patient is deemed medically stable to be discharged home at this time as he is improved and he will need to follow with PCP, pulmonary, cardiology and ENT in outpatient setting.  Discharge Diagnoses:  Active Problems:   BMI 45.0-49.9, adult (HCC)   Essential hypertension, benign   Smoker   Acute respiratory failure with hypoxia (HCC)   Hemoptysis   Acute respiratory failure (HCC)   Hyperglycemia   Pre-diabetes   Suspected sleep apnea   Acute diastolic CHF (congestive heart failure) (HCC)   Hypermagnesemia   Hyperphosphatemia  Acute Respiratory Failure with Hypoxia associated with Hemoptysis, improved  -Was able to wean off of oxygen and saturating on room air -Has no signs or risks factors for tuberculosis -CTA of the chest showed no evidence of any significant pulmonary embolus.  There is few tiny nonspecific pleural-based nodular densities on the left side measuring up to 5 mm -Repeat chest x-ray this a.m. showed lungs are well aerated and clear.  There is no evidence of any focal opacification pleural effusion thorax.  Cardiomediastinal silhouette was within normal limits and no  acute osseous abnormalities are seen -Continue as pulse oximetry and maintain O2 saturation greater than 90% -Continue supplemental oxygen via nasal cannula and wean O2 as tolerated as necessary; patient is no longer wearing oxygen and is on room air -Repeat chest X-Ray this AM  Negative  -Not on any nebs -Pulmonary following and appreciate further evaluation recommendations and did bronchoscopy that showed bleeding in the RUL that was washed. It was suspected from Bronchitis so she was placed on p.o. prednisone  Hemoptysis in the setting of likely suspected bronchitis -Improving and patient is currently hemodynamically stable -Patient's hemoglobin/hematocrit went from 14.2/43.0 -> 13.7/42.3 -> 13.9/42.6 -> 13.3/41.0 -Continue to monitor for signs and symptoms of bleeding -Bronchoscopy revealed bleeding in the right upper lobe which was washed and improved post washing.  Patient was placed on a prednisone taper -Repeat CBC as an outpatient    Hyperphosphatemia -Patient's phosphorus level was 5.0 -Continue monitor and repeat phosphorus level in the outpatient  Hypermagnesemia -Patient magnesium level was 2.4 -Continue monitor repeat magnesium level as an outpatient  Hypertension -Has a history of noncompliance after changing jobs- -resume home medications lisinopril 20 minutes p.o. daily along with hydrochlorothiazide 25 mg p.o. daily and will discharge on this -Continue with IV hydralazine 5 mg IV every 4 PRN for systolic blood pressure greater than 160  Mild Acute Grade 1 Diastolic CHF -Continue home hydrochlorothiazide -Has 1+ LE edema  -Echocardiogram showed systolic function with a normal EF of 55 to 60% with normal wall motion and no regional wall motion abnormalities.  Doppler parameters were consistent with abnormal left ventricular relaxation and showed grade 1 diastolic dysfunction Patient was +480 mL since admission -BNP was 24.3 but he is severely morbidly obese -Strict I's and O's and daily weights; fluid restrict to 1500 mL -Elevate extremities -Continue monitor volume status extremely carefully  ?PFO/ASD -Obtained Bubble study for further evaluation and showed No defect or patent foramen ovale was identified. Echo contrast study showed  no right-to-left atrial level shunt, at baseline or with provocation.  Tobacco Abuse -Smoking Cessation counseling given -Currently Declined Nicotine patch and states he will quit "cold [place]"  Morbid Obesity -Estimated body mass index is 46.11 kg/m as calculated from the following:   Height as of this encounter: 5' 6.5" (1.689 m).   Weight as of this encounter: 131.5 kg. -Weight Loss and Dietary Counseling Given  Hyperglycemia in the setting of former Pre-Diabetes -Patient's Blood Sugar on BMP/CMP has been ranging from 119-135 -Check HbA1c was 5.3; Last HbA1c was 2 years ago and was 5.8 -Continue to Monitor Blood Sugars and if remains continually elevated will place on Sensitive Novolog SSI  Suspected Obstructive Sleep Apnea -We will need outpatient sleep study and pulmonary arranging follow-up as an outpatient  Discharge Instructions  Discharge Instructions    Call MD for:  difficulty breathing, headache or visual disturbances   Complete by:  As directed    Call MD for:  extreme fatigue   Complete by:  As directed    Call MD for:  hives   Complete by:  As directed    Call MD for:  persistant dizziness or light-headedness   Complete by:  As directed    Call MD for:  persistant nausea and vomiting   Complete by:  As directed    Call MD for:  redness, tenderness, or signs of infection (pain, swelling, redness, odor or green/yellow discharge around incision site)   Complete by:  As directed    Call MD  for:  severe uncontrolled pain   Complete by:  As directed    Call MD for:  temperature >100.4   Complete by:  As directed    Diet - low sodium heart healthy   Complete by:  As directed    Discharge instructions   Complete by:  As directed    You were cared for by a hospitalist during your hospital stay. If you have any questions about your discharge medications or the care you received while you were in the hospital after you are discharged, you can call the unit and ask  to speak with the hospitalist on call if the hospitalist that took care of you is not available. Once you are discharged, your primary care physician will handle any further medical issues. Please note that NO REFILLS for any discharge medications will be authorized once you are discharged, as it is imperative that you return to your primary care physician (or establish a relationship with a primary care physician if you do not have one) for your aftercare needs so that they can reassess your need for medications and monitor your lab values.  Follow up with PCP, Pulmonary, Cardiology, and with ENT. Take all medications as prescribed. If symptoms change or worsen please return to the ED for evaluation   Increase activity slowly   Complete by:  As directed      Allergies as of 12/09/2017   No Known Allergies     Medication List    STOP taking these medications   BC HEADACHE POWDER PO   lisinopril-hydrochlorothiazide 10-12.5 MG tablet Commonly known as:  PRINZIDE,ZESTORETIC     TAKE these medications   albuterol 108 (90 Base) MCG/ACT inhaler Commonly known as:  PROVENTIL HFA;VENTOLIN HFA Inhale 2 puffs into the lungs every 4 (four) hours as needed for wheezing or shortness of breath (cough, shortness of breath or wheezing.).   hydrochlorothiazide 25 MG tablet Commonly known as:  HYDRODIURIL Take 1 tablet (25 mg total) by mouth daily. Start taking on:  12/10/2017   ipratropium 0.03 % nasal spray Commonly known as:  ATROVENT Place 2 sprays into both nostrils 2 (two) times daily.   lisinopril 20 MG tablet Commonly known as:  PRINIVIL,ZESTRIL Take 1 tablet (20 mg total) by mouth daily. Start taking on:  12/10/2017   predniSONE 20 MG tablet Commonly known as:  DELTASONE Take 1 tablet (20 mg total) by mouth 2 (two) times daily with a meal.      Follow-up Information    [facility] Follow up.   Contact information: [place]  [id number] [phone]       Jodi Marble, MD. Call.   Specialty:  Otolaryngology Why:  Follow up for Nasal Bogginess/Sinusitis  Contact information: [address] [place] [address] [phone]        [facility]. Call.   Specialty:  Pulmonology Why:  Appointment to be made for follow up and OSA Contact information: [place]. Call.   Why:  Follow up for Diastolic CHF, and Hypertension  Contact information: [place] [id number] [phone]         No Known Allergies  Consultations:  Pulmonary  Procedures/Studies: Dg Chest 2 View  Result Date: 12/06/2017 CLINICAL DATA:  Cough and hemoptysis. EXAM: CHEST - 2 VIEW COMPARISON:  12/15/2015  FINDINGS: The heart is mildly enlarged with a left ventricular configuration. The mediastinal and hilar contours appear stable. Low lung volumes with vascular crowding and streaky atelectasis. Stable eventration right hemidiaphragm. No definite infiltrates or effusions. No worrisome pulmonary lesions. The bony thorax is intact. IMPRESSION: Low lung volumes with vascular crowding and atelectasis. No definite infiltrates or effusions. Electronically Signed   By: Marijo Sanes M.D.   On: 12/06/2017 23:03   Ct Angio Chest Pe W And/or Wo Contrast  Result Date: 12/06/2017 CLINICAL DATA:  Acute onset of dyspnea and cough. Angina. EXAM: CT ANGIOGRAPHY CHEST WITH CONTRAST TECHNIQUE: Multidetector CT imaging of the chest was performed using the standard protocol during bolus administration of intravenous contrast. Multiplanar CT image reconstructions and MIPs were obtained to evaluate the vascular anatomy. CONTRAST:  158mL ISOVUE-370 IOPAMIDOL (ISOVUE-370) INJECTION 76% COMPARISON:  Chest radiograph performed earlier today at 10:55 p.m. FINDINGS:  Cardiovascular: There is no evidence of significant pulmonary embolus. Evaluation for pulmonary embolus is mildly suboptimal due to motion artifact. The heart is borderline normal in size. The thoracic aorta is grossly unremarkable. The great vessels are within normal limits. Mediastinum/Nodes: The mediastinum is grossly unremarkable. No mediastinal lymphadenopathy is seen. No pericardial effusion is identified. The thyroid gland is unremarkable. No axillary lymphadenopathy is seen. Lungs/Pleura: The lungs are grossly clear, aside from a few tiny nonspecific pleural based nodular densities on the left, measuring up to 5 mm. A small lymph node is seen along the left major fissure. No pleural effusion or pneumothorax is seen. No dominant masses are identified. Upper Abdomen: The visualized portions of the liver and spleen are unremarkable. The visualized portions of the adrenal glands and kidneys are within normal limits. Musculoskeletal: No acute osseous abnormalities are identified. The visualized musculature is unremarkable in appearance. Review of the MIP images confirms the above findings. IMPRESSION: 1. No evidence of significant pulmonary embolus. 2. Few tiny nonspecific pleural based nodular densities on the left, measuring up to 5 mm. No follow-up needed if patient is low-risk (and has no known or suspected primary neoplasm). Non-contrast chest CT can be considered in 12 months if patient is high-risk. This recommendation follows the consensus statement: Guidelines for Management of Incidental Pulmonary Nodules Detected on CT Images: From the Fleischner Society 2017; Radiology 2017; 284:228-243. Electronically Signed   By: Garald Balding M.D.   On: 12/06/2017 23:50   Dg Chest Port 1 View  Result Date: 12/09/2017 CLINICAL DATA:  Hypoxia EXAM: PORTABLE CHEST 1 VIEW COMPARISON:  12/08/2017 FINDINGS: Cardiac shadow remains enlarged but accentuated by the portable technique. The lungs are well aerated  bilaterally. No focal infiltrate or sizable effusion is seen. No bony abnormality is noted. IMPRESSION: No acute abnormality seen. Electronically Signed   By: Inez Catalina M.D.   On: 12/09/2017 08:31   Dg Chest Port 1 View  Result Date: 12/08/2017 CLINICAL DATA:  Acute onset of shortness of breath. EXAM: PORTABLE CHEST 1 VIEW COMPARISON:  Chest radiograph and CTA of the chest performed 12/06/2017 FINDINGS: The lungs are well-aerated and clear. There is no evidence of focal opacification, pleural effusion or pneumothorax. The cardiomediastinal silhouette is within normal limits. No acute osseous abnormalities are seen. IMPRESSION: No acute cardiopulmonary process seen. Electronically Signed   By: Garald Balding M.D.   On: 12/08/2017 04:58    ECHOCARDIOGRAM ------------------------------------------------------------------- Study Conclusions  - Left ventricle: The cavity size was normal. Wall thickness was   increased in a pattern of moderate LVH. Systolic function was  normal. The estimated ejection fraction was in the range of 55%   to 60%. Wall motion was normal; there were no regional wall   motion abnormalities. Doppler parameters are consistent with   abnormal left ventricular relaxation (grade 1 diastolic   dysfunction). - Aortic valve: Moderately calcified annulus. Trileaflet. - Mitral valve: There was mild regurgitation. - Left atrium: The atrium was mildly dilated. - Right atrium: Central venous pressure (est): 3 mm Hg. - Atrial septum: PFO/ASD cannot be excluded. Consider bubble study   for further evaluation. - Tricuspid valve: There was trivial regurgitation. - Pulmonary arteries: PA peak pressure: 26 mm Hg (S). - Pericardium, extracardiac: A trivial pericardial effusion was   identified posterior to the heart.  ECHOCARDIOGRAM with BUBBLE STUDY ------------------------------------------------------------------- Study Conclusions  - Left ventricle: The cavity size was  normal. Wall thickness was   increased in a pattern of mild LVH. Systolic function was normal.   The estimated ejection fraction was in the range of 55% to 60%. - Right ventricle: The cavity size was normal. Systolic function   was normal. - Atrial septum: No defect or patent foramen ovale was identified.   Echo contrast study showed no right-to-left atrial level shunt,   at baseline or with provocation. - Pericardium, extracardiac: A trivial pericardial effusion was   identified.  Impressions:  - Limited echo for bubble study.  BRONCHOSCOPY Findings:      Right Lung Abnormalities: Fresh blood was found in the right upper lobe.       The bleeding site was not identified. Washings were obtained in the       right upper lobe and sent for routine cytology and bacterial, AFB and       fungal analysis. The return was blood-tinged.      Nasal passageway: a lot of bogginess, redundant tissue in left naris Impression:      - Hemoptysis      - Blood was present in the right upper lobe.      - Washings were obtained.  Subjective: Seen and examined prior to his bronchoscopy and doing well.  States that he is hungry.  No chest pain, lightheadedness or dizziness.  States he had a good night.  No other concerns or complaints at this time.  Discharge Exam: Vitals:   12/09/17 1615 12/09/17 1625  BP: 130/90 123/61  Pulse:    Resp: 17 17  Temp:    SpO2:     Vitals:   12/09/17 1600 12/09/17 1610 12/09/17 1615 12/09/17 1625  BP: (!) 138/97 133/89 130/90 123/61  Pulse:      Resp: 18 18 17 17   Temp:      TempSrc:      SpO2:      Weight:      Height:       General: Pt is alert, awake, not in acute distress Cardiovascular: RRR, S1/S2 +, no rubs, no gallops Respiratory: Diminished bilaterally, no wheezing, no rhonchi Abdominal: Soft, NT, distended secondary body habitus, bowel sounds + Extremities: Trace to 1+ lower extremity edema edema, no cyanosis  The results of significant  diagnostics from this hospitalization (including imaging, microbiology, ancillary and laboratory) are listed below for reference.    Microbiology: No results found for this or any previous visit (from the past 240 hour(s)).   Labs: BNP (last 3 results) Recent Labs    12/06/17 2221  BNP 76.1   Basic Metabolic Panel: Recent Labs  Lab 12/06/17 2221 12/07/17 0635 12/08/17 0548 12/09/17  0631  NA 140 139 140 141  K 3.6 3.6 3.6 3.6  CL 101 102 102 101  CO2 30 29 29  33*  GLUCOSE 119* 127* 135* 118*  BUN 20 22* 19 18  CREATININE 1.25* 1.07 0.97 0.97  CALCIUM 8.9 8.8* 9.1 8.8*  MG  --  2.5* 2.5* 2.4  PHOS  --  5.1* 5.0* 5.0*   Liver Function Tests: Recent Labs  Lab 12/06/17 2221 12/07/17 0635 12/08/17 0548 12/09/17 0631  AST 20 20 16 15   ALT 17 17 15 14   ALKPHOS 54 54 50 45  BILITOT 0.6 0.9 0.5 0.9  PROT 7.4 7.4 7.1 6.9  ALBUMIN 4.0 4.2 3.8 3.6   No results for input(s): LIPASE, AMYLASE in the last 168 hours. No results for input(s): AMMONIA in the last 168 hours. CBC: Recent Labs  Lab 12/06/17 2221 12/07/17 0635 12/08/17 0548 12/09/17 0631  WBC 6.0 5.1 5.3 5.1  NEUTROABS  --  2.4 2.3 2.1  HGB 14.2 13.7 13.9 13.3  HCT 43.0 42.3 42.6 41.0  MCV 87.2 89.4 87.8 88.9  PLT 225 204 208 199   Cardiac Enzymes: Recent Labs  Lab 12/07/17 0635  TROPONINI <0.03   BNP: Invalid input(s): POCBNP CBG: No results for input(s): GLUCAP in the last 168 hours. D-Dimer No results for input(s): DDIMER in the last 72 hours. Hgb A1c Recent Labs    12/09/17 0631  HGBA1C 5.6   Lipid Profile No results for input(s): CHOL, HDL, LDLCALC, TRIG, CHOLHDL, LDLDIRECT in the last 72 hours. Thyroid function studies No results for input(s): TSH, T4TOTAL, T3FREE, THYROIDAB in the last 72 hours.  Invalid input(s): FREET3 Anemia work up No results for input(s): VITAMINB12, FOLATE, FERRITIN, TIBC, IRON, RETICCTPCT in the last 72 hours. Urinalysis No results found for: COLORURINE,  APPEARANCEUR, LABSPEC, [place], GLUCOSEU, HGBUR, BILIRUBINUR, KETONESUR, PROTEINUR, UROBILINOGEN, NITRITE, LEUKOCYTESUR Sepsis Labs Invalid input(s): PROCALCITONIN,  WBC,  LACTICIDVEN Microbiology No results found for this or any previous visit (from the past 240 hour(s)).  Time coordinating discharge: 35 minutes  SIGNED:  Kerney Elbe, DO [facility] 12/09/2017, 9:28 PM Pager is on [place]  If 7PM-7AM, please contact night-coverage [email or web address] Password [email or web address]

## 2017-12-09 NOTE — Progress Notes (Signed)
NAME:  Randy Morgan, MRN:  [id number], DOB:  09/21/1965, LOS: 1 ADMISSION DATE:  12/06/2017, CONSULTATION DATE: 11/11 2019 REFERRING MD: Alfredia Ferguson, CHIEF COMPLAINT: Hemoptysis  Brief History   52 year old smoker, admitted 11/ 9 with hemoptysis-moderate to severe hemoptysis Did have hypoxia History of present illness   Patient had 3 to 4 days of persistent expectorations of bright red blood He did have a cough prior to expectoration of blood No weight loss No fevers Appetite has been maintained  Past Medical History  Hypertension, obesity, tobacco abuse Significant Hospital Events      Significant Diagnostic Tests:  CT scan of the chest 11/9-tiny non specific pleural-based nodules   Interim history/subjective:  Denies any chest pains or chest discomfort Still has a streak of blood in his phlegm otherwise not coughing very actively at present  Objective   Blood pressure 138/88, pulse 91, temperature 97.8 F (36.6 C), temperature source Oral, resp. rate 16, height 5' 6.5" (1.689 m), weight 131.5 kg, SpO2 98 %.        Intake/Output Summary (Last 24 hours) at 12/09/2017 1113 Last data filed at 12/09/2017 0845 Gross per 24 hour  Intake 240 ml  Output -  Net 240 ml   Filed Weights   12/06/17 2206  Weight: 131.5 kg    Examination: General: Pleasant 52 year old male patient in no acute distress HENT: Normocephalic, atraumatic Lungs: Diminished at the bases bilaterally Cardiovascular: S1-S2 appreciated Abdomen: Bowel sounds appreciated Extremities: Trace lower extremity edema Neuro: Awake alert oriented x3 GU: Good urine output   Assessment & Plan:  Hemoptysis -Etiology is unclear at the present time May be secondary to a bronchitis Risk of endobronchial lesion-we will plan for bronchoscopy today CT scan of the chest is unrevealing, small lung nodule-unlikely to be of any significance  He does have an active smoking history-smoking cessation counseling  High  probability of significant sleep disordered breathing -We will need further work-up as outpatient  Hypertension -Continue lines of care  Best practice:  Diet: N.p.o. for bronchoscopy Mobility: As tolerated Code Status: Full code Family Communication: Spoke with spouse at bedside Disposition: Possibly home today following bronchoscopy  Labs   CBC: Recent Labs  Lab 12/06/17 2221 12/07/17 0635 12/08/17 0548 12/09/17 0631  WBC 6.0 5.1 5.3 5.1  NEUTROABS  --  2.4 2.3 2.1  HGB 14.2 13.7 13.9 13.3  HCT 43.0 42.3 42.6 41.0  MCV 87.2 89.4 87.8 88.9  PLT 225 204 208 338    Basic Metabolic Panel: Recent Labs  Lab 12/06/17 2221 12/07/17 0635 12/08/17 0548 12/09/17 0631  NA 140 139 140 141  K 3.6 3.6 3.6 3.6  CL 101 102 102 101  CO2 30 29 29  33*  GLUCOSE 119* 127* 135* 118*  BUN 20 22* 19 18  CREATININE 1.25* 1.07 0.97 0.97  CALCIUM 8.9 8.8* 9.1 8.8*  MG  --  2.5* 2.5* 2.4  PHOS  --  5.1* 5.0* 5.0*   GFR: Estimated Creatinine Clearance: 115.4 mL/min (by C-G formula based on SCr of 0.97 mg/dL). Recent Labs  Lab 12/06/17 2221 12/07/17 0635 12/08/17 0548 12/09/17 0631  WBC 6.0 5.1 5.3 5.1    Liver Function Tests: Recent Labs  Lab 12/06/17 2221 12/07/17 0635 12/08/17 0548 12/09/17 0631  AST 20 20 16 15   ALT 17 17 15 14   ALKPHOS 54 54 50 45  BILITOT 0.6 0.9 0.5 0.9  PROT 7.4 7.4 7.1 6.9  ALBUMIN 4.0 4.2 3.8 3.6   No results for input(s):  LIPASE, AMYLASE in the last 168 hours. No results for input(s): AMMONIA in the last 168 hours.  ABG No results found for: PHART, PCO2ART, PO2ART, HCO3, TCO2, ACIDBASEDEF, O2SAT   Coagulation Profile: Recent Labs  Lab 12/07/17 0635  INR 0.93    Cardiac Enzymes: Recent Labs  Lab 12/07/17 0635  TROPONINI <0.03    HbA1C: Hgb A1c MFr Bld  Date/Time Value Ref Range Status  12/09/2017 06:31 AM 5.6 4.8 - 5.6 % Final    Comment:    (NOTE) Pre diabetes:          5.7%-6.4% Diabetes:              >6.4% Glycemic  control for   <7.0% adults with diabetes   01/30/2016 07:38 PM 5.8 (H) 4.8 - 5.6 % Final    Comment:             Pre-diabetes: 5.7 - 6.4          Diabetes: >6.4          Glycemic control for adults with diabetes: <7.0     CBG: No results for input(s): GLUCAP in the last 168 hours.  Review of Systems:   Review of Systems  Constitutional: Negative for fever.  HENT: Negative.   Eyes: Negative.   Respiratory: Positive for cough and hemoptysis.   Cardiovascular: Negative.   Gastrointestinal: Negative.   Genitourinary: Negative.   Musculoskeletal: Negative.   Skin: Negative.   Psychiatric/Behavioral: Negative.      Past Medical History  He,  has a past medical history of Hypertension.   Surgical History    Past Surgical History:  Procedure Laterality Date  . NO PAST SURGERIES       Social History   reports that he has been smoking cigarettes. He has a 12.50 pack-year smoking history. He has never used smokeless tobacco. He reports that he drank about 2.0 standard drinks of alcohol per week. He reports that he does not use drugs.   Family History   His family history includes Asthma in his mother and sister; Diabetes in his mother; Heart attack in his brother; Heart disease in his brother, father, and mother.   Allergies No Known Allergies   Home Medications  Prior to Admission medications   Medication Sig Start Date End Date Taking? Authorizing Provider  Aspirin-Salicylamide-Caffeine (BC HEADACHE POWDER PO) Take 1 each by mouth every 8 (eight) hours as needed (pain).   Yes [provider]  albuterol (PROVENTIL HFA;VENTOLIN HFA) 108 (90 Base) MCG/ACT inhaler Inhale 2 puffs into the lungs every 4 (four) hours as needed for wheezing or shortness of breath (cough, shortness of breath or wheezing.). Patient not taking: Reported on 12/07/2017 01/30/16   Harrison Mons, PA  ipratropium (ATROVENT) 0.03 % nasal spray Place 2 sprays into both nostrils 2 (two) times  daily. Patient not taking: Reported on 12/07/2017 02/27/16   Harrison Mons, PA  lisinopril-hydrochlorothiazide (PRINZIDE,ZESTORETIC) 10-12.5 MG tablet Take 2 tablets by mouth daily. Patient not taking: Reported on 12/07/2017 01/30/16   Harrison Mons, PA     For bronchoscopy this p.m.

## 2017-12-09 NOTE — Procedures (Signed)
PCCM Video Bronchoscopy Procedure Note  The patient was informed of the risks (including but not limited to bleeding, infection, respiratory failure, lung injury, tooth/oral injury) and benefits of the procedure and gave consent, see chart.  Indication: Hemoptysis  Post Procedure Diagnosis: Hemoptysis  Location: Right upper lobe bleeding  Condition pre procedure: Stable status  Medications for procedure: Fentanyl 50 mcg, Versed 4 mg  Procedure description: The bronchoscope was introduced through the left nasal passage  and passed to the bilateral lungs to the level of the subsegmental bronchi throughout the tracheobronchial tree.  Airway exam revealed some bleeding noted in the right upper lobe, following washings very minimal oozing noted, does not appear to be actively bleeding at present No endobronchial lesion noted in the trachea, no endobronchial lesion noted in the right bronchus, no endobronchial lesion noted on the left bronchus Bleeding noted from the right upper lobe, following washing, very minimal oozing There was some bogginess noted in the left nasal passage  Procedures performed: Washings from a right upper lobe  Specimens sent: Washings sent for cytology, cultures, Gram stain  Condition post procedure: Stable status  EBL: No bleeding  Complications: No complications

## 2017-12-09 NOTE — Progress Notes (Signed)
Video Bronchoscopy done Intervention Bronchial washing done Procedure tolerated well 

## 2017-12-09 NOTE — Op Note (Signed)
[facility] Cardiopulmonary Patient Name: Randy Morgan Procedure Date: 12/09/2017 MRN: [id number] Attending MD: Laurin Coder MD, MD Date of Birth: [birth year] CSN: [id number] Age: 52 Admit Type: Inpatient Ethnicity: Not Hispanic or Latino Procedure:            Bronchoscopy Indications:          Hemoptysis Providers:            Randy Brauner A. Thaddeaus Monica MD, MD, Randy Morgan, Randy Morgan RRT,RCP Referring MD:          Medicines:            Fentanyl 50 mcg IV, Midazolam 4 mg IV Complications:        No immediate complications Estimated Blood Loss: Estimated blood loss: none. Procedure:      Pre-Anesthesia Assessment:      - ASA Grade Assessment: I - A normal healthy patient.      After obtaining informed consent, the bronchoscope was passed under       direct vision. Throughout the procedure, the patient's blood pressure,       pulse, and oxygen saturations were monitored continuously. the BF-H190       ([id number]) Olympus Bronchoscope was introduced through the left nostril       and advanced to the tracheobronchial tree of both lungs. The procedure       was accomplished without difficulty. The patient tolerated the procedure       well. Findings:      Right Lung Abnormalities: Fresh blood was found in the right upper lobe.       The bleeding site was not identified. Washings were obtained in the       right upper lobe and sent for routine cytology and bacterial, AFB and       fungal analysis. The return was blood-tinged.      Nasal passageway: a lot of bogginess, redundant tissue in left naris Impression:      - Hemoptysis      - Blood was present in the right upper lobe.      - Washings were obtained. Moderate Sedation:      Moderate (conscious) sedation was administered by the nurse and       supervised by the physician performing the procedure. The patient's       oxygen saturation, heart rate, blood pressure and response to  care were       monitored. Total physician intraservice time was 17 minutes. Recommendation:      - Await washing results. Procedure Code(s):      --- Professional ---      [phone], Bronchoscopy, rigid or flexible, including fluoroscopic guidance,       when performed; diagnostic, with cell washing, when performed (separate       procedure)      [id number], Moderate sedation services provided by the same physician or       other qualified health care professional performing the diagnostic or       therapeutic service that the sedation supports, requiring the presence       of an independent trained observer to assist in the monitoring of the       patient's level of consciousness and physiological status; initial 15       minutes of intraservice time, patient age 89 years or older  Diagnosis Code(s):      --- Professional ---      R04.2, Hemoptysis CPT copyright 2018 [employer]. All rights reserved. The codes documented in this report are preliminary and upon coder review may  be revised to meet current compliance requirements. Sherrilyn Rist, MD Laurin Coder MD, MD 12/09/2017 3:59:31 PM This report has been signed electronically. Number of Addenda: 0 Scope In: 3:39:12 PM Scope Out: 3:50:29 PM

## 2017-12-09 NOTE — Progress Notes (Signed)
  Bronchoscopy significant for bogginess noted in left naris-may be secondary to chronic rhinitis however was more significant than on the right  Bleeding noted right upper lobe airway, not actively bleeding post washing  May be secondary to a bronchitis  We will treat him with a course of steroids prednisone 20 p.o. twice daily for 5-7 days  He can be discharged from my perspective  He should follow-up with ENT to assess the bogginess in the left nasal passage

## 2017-12-10 ENCOUNTER — Encounter (HOSPITAL_COMMUNITY): Payer: Self-pay | Admitting: Pulmonary Disease

## 2017-12-12 LAB — CULTURE, RESPIRATORY

## 2017-12-12 LAB — CULTURE, RESPIRATORY W GRAM STAIN

## 2017-12-15 ENCOUNTER — Telehealth: Payer: Self-pay | Admitting: Pulmonary Disease

## 2017-12-15 NOTE — Telephone Encounter (Signed)
Call patient, left message on his answering machine  No significant abnormality on his washings  He has an appointment to follow-up in the office for possible sleep disordered breathing

## 2017-12-18 ENCOUNTER — Ambulatory Visit: Payer: Self-pay | Admitting: Internal Medicine

## 2018-01-01 LAB — CULTURE, FUNGUS WITHOUT SMEAR

## 2018-02-16 ENCOUNTER — Ambulatory Visit: Payer: Self-pay | Admitting: Internal Medicine

## 2018-12-15 ENCOUNTER — Other Ambulatory Visit: Payer: Self-pay

## 2018-12-15 DIAGNOSIS — Z20822 Contact with and (suspected) exposure to covid-19: Secondary | ICD-10-CM

## 2018-12-17 LAB — NOVEL CORONAVIRUS, NAA: SARS-CoV-2, NAA: NOT DETECTED

## 2019-04-12 ENCOUNTER — Other Ambulatory Visit: Payer: Self-pay

## 2019-04-12 DIAGNOSIS — Z20822 Contact with and (suspected) exposure to covid-19: Secondary | ICD-10-CM

## 2019-04-13 LAB — NOVEL CORONAVIRUS, NAA: SARS-CoV-2, NAA: NOT DETECTED

## 2019-07-16 ENCOUNTER — Ambulatory Visit (INDEPENDENT_AMBULATORY_CARE_PROVIDER_SITE_OTHER): Payer: 59 | Admitting: Nurse Practitioner

## 2019-07-16 ENCOUNTER — Encounter: Payer: Self-pay | Admitting: Nurse Practitioner

## 2019-07-16 ENCOUNTER — Other Ambulatory Visit: Payer: Self-pay

## 2019-07-16 VITALS — BP 162/102 | HR 88 | Temp 97.6°F | Ht 66.5 in | Wt 321.0 lb

## 2019-07-16 DIAGNOSIS — Z131 Encounter for screening for diabetes mellitus: Secondary | ICD-10-CM | POA: Diagnosis not present

## 2019-07-16 DIAGNOSIS — R29818 Other symptoms and signs involving the nervous system: Secondary | ICD-10-CM

## 2019-07-16 DIAGNOSIS — R600 Localized edema: Secondary | ICD-10-CM

## 2019-07-16 DIAGNOSIS — I10 Essential (primary) hypertension: Secondary | ICD-10-CM

## 2019-07-16 DIAGNOSIS — Z6841 Body Mass Index (BMI) 40.0 and over, adult: Secondary | ICD-10-CM

## 2019-07-16 DIAGNOSIS — Z0000 Encounter for general adult medical examination without abnormal findings: Secondary | ICD-10-CM | POA: Diagnosis not present

## 2019-07-16 DIAGNOSIS — Z7689 Persons encountering health services in other specified circumstances: Secondary | ICD-10-CM

## 2019-07-16 DIAGNOSIS — E661 Drug-induced obesity: Secondary | ICD-10-CM

## 2019-07-16 LAB — POCT URINALYSIS DIPSTICK
Bilirubin, UA: NEGATIVE
Blood, UA: NEGATIVE
Glucose, UA: NEGATIVE
Ketones, UA: NEGATIVE
Nitrite, UA: NEGATIVE
Protein, UA: NEGATIVE
Spec Grav, UA: >=1.03 — AB (ref 1.010–1.025)
Urobilinogen, UA: 0.2 E.U./dL
pH, UA: 5.5 (ref 5.0–8.0)

## 2019-07-16 LAB — POCT GLYCOSYLATED HEMOGLOBIN (HGB A1C)
HbA1c POC (<> result, manual entry): 6.1 % (ref 4.0–5.6)
HbA1c, POC (controlled diabetic range): 6.1 % (ref 0.0–7.0)
HbA1c, POC (prediabetic range): 6.1 % (ref 5.7–6.4)
Hemoglobin A1C: 6.1 % — AB (ref 4.0–5.6)

## 2019-07-16 LAB — GLUCOSE, POCT (MANUAL RESULT ENTRY): POC Glucose: 101 mg/dl — AB (ref 70–99)

## 2019-07-16 MED ORDER — FUROSEMIDE 20 MG PO TABS: 20.0000 mg | ORAL_TABLET | Freq: Every day | ORAL | 0 refills | Status: DC

## 2019-07-16 MED ORDER — HYDROCHLOROTHIAZIDE 25 MG PO TABS: 25.0000 mg | ORAL_TABLET | Freq: Every day | ORAL | 0 refills | Status: DC

## 2019-07-16 MED ORDER — LISINOPRIL 20 MG PO TABS: 20.0000 mg | ORAL_TABLET | Freq: Every day | ORAL | 0 refills | Status: DC

## 2019-07-16 MED ORDER — CLONIDINE HCL 0.1 MG PO TABS: 0.2000 mg | ORAL_TABLET | Freq: Once | ORAL | Status: DC

## 2019-07-16 NOTE — Patient Instructions (Addendum)
   Managing Your Hypertension Hypertension is commonly called high blood pressure. This is when the force of your blood pressing against the walls of your arteries is too strong. Arteries are blood vessels that carry blood from your heart throughout your body. Hypertension forces the heart to work harder to pump blood, and may cause the arteries to become narrow or stiff. Having untreated or uncontrolled hypertension can cause heart attack, stroke, kidney disease, and other problems. What are blood pressure readings? A blood pressure reading consists of a higher number over a lower number. Ideally, your blood pressure should be below 120/80. The first ("top") number is called the systolic pressure. It is a measure of the pressure in your arteries as your heart beats. The second ("bottom") number is called the diastolic pressure. It is a measure of the pressure in your arteries as the heart relaxes. What does my blood pressure reading mean? Blood pressure is classified into four stages. Based on your blood pressure reading, your health care provider may use the following stages to determine what type of treatment you need, if any. Systolic pressure and diastolic pressure are measured in a unit called mm Hg. Normal  Systolic pressure: below 120.  Diastolic pressure: below 80. Elevated  Systolic pressure: 120-129.  Diastolic pressure: below 80. Hypertension stage 1  Systolic pressure: 130-139.  Diastolic pressure: 80-89. Hypertension stage 2  Systolic pressure: 140 or above.  Diastolic pressure: 90 or above. What health risks are associated with hypertension? Managing your hypertension is an important responsibility. Uncontrolled hypertension can lead to:  A heart attack.  A stroke.  A weakened blood vessel (aneurysm).  Heart failure.  Kidney damage.  Eye damage.  Metabolic syndrome.  Memory and concentration problems. What changes can I make to manage my  hypertension? Hypertension can be managed by making lifestyle changes and possibly by taking medicines. Your health care provider will help you make a plan to bring your blood pressure within a normal range. Eating and drinking   Eat a diet that is high in fiber and potassium, and low in salt (sodium), added sugar, and fat. An example eating plan is called the DASH (Dietary Approaches to Stop Hypertension) diet. To eat this way: ? Eat plenty of fresh fruits and vegetables. Try to fill half of your plate at each meal with fruits and vegetables. ? Eat whole grains, such as whole wheat pasta, brown rice, or whole grain bread. Fill about one quarter of your plate with whole grains. ? Eat low-fat diary products. ? Avoid fatty cuts of meat, processed or cured meats, and poultry with skin. Fill about one quarter of your plate with lean proteins such as fish, chicken without skin, beans, eggs, and tofu. ? Avoid premade and processed foods. These tend to be higher in sodium, added sugar, and fat.  Reduce your daily sodium intake. Most people with hypertension should eat less than 1,500 mg of sodium a day.  Limit alcohol intake to no more than 1 drink a day for nonpregnant women and 2 drinks a day for men. One drink equals 12 oz of beer, 5 oz of wine, or 1 oz of hard liquor. Lifestyle  Work with your health care provider to maintain a healthy body weight, or to lose weight. Ask what an ideal weight is for you.  Get at least 30 minutes of exercise that causes your heart to beat faster (aerobic exercise) most days of the week. Activities may include walking, swimming, or biking.    Include exercise to strengthen your muscles (resistance exercise), such as weight lifting, as part of your weekly exercise routine. Try to do these types of exercises for 30 minutes at least 3 days a week.  Do not use any products that contain nicotine or tobacco, such as cigarettes and e-cigarettes. If you need help quitting,  ask your health care provider.  Control any long-term (chronic) conditions you have, such as high cholesterol or diabetes. Monitoring  Monitor your blood pressure at home as told by your health care provider. Your personal target blood pressure may vary depending on your medical conditions, your age, and other factors.  Have your blood pressure checked regularly, as often as told by your health care provider. Working with your health care provider  Review all the medicines you take with your health care provider because there may be side effects or interactions.  Talk with your health care provider about your diet, exercise habits, and other lifestyle factors that may be contributing to hypertension.  Visit your health care provider regularly. Your health care provider can help you create and adjust your plan for managing hypertension. Will I need medicine to control my blood pressure? Your health care provider may prescribe medicine if lifestyle changes are not enough to get your blood pressure under control, and if:  Your systolic blood pressure is 130 or higher.  Your diastolic blood pressure is 80 or higher. Take medicines only as told by your health care provider. Follow the directions carefully. Blood pressure medicines must be taken as prescribed. The medicine does not work as well when you skip doses. Skipping doses also puts you at risk for problems. Contact a health care provider if:  You think you are having a reaction to medicines you have taken.  You have repeated (recurrent) headaches.  You feel dizzy.  You have swelling in your ankles.  You have trouble with your vision. Get help right away if:  You develop a severe headache or confusion.  You have unusual weakness or numbness, or you feel faint.  You have severe pain in your chest or abdomen.  You vomit repeatedly.  You have trouble breathing. Summary  Hypertension is when the force of blood pumping  through your arteries is too strong. If this condition is not controlled, it may put you at risk for serious complications.  Your personal target blood pressure may vary depending on your medical conditions, your age, and other factors. For most people, a normal blood pressure is less than 120/80.  Hypertension is managed by lifestyle changes, medicines, or both. Lifestyle changes include weight loss, eating a healthy, low-sodium diet, exercising more, and limiting alcohol. This information is not intended to replace advice given to you by your health care provider. Make sure you discuss any questions you have with your health care provider. Document Revised: 05/08/2018 Document Reviewed: 12/13/2015 [employer] Patient Education  2020 [employer]  

## 2019-07-16 NOTE — Progress Notes (Signed)
[place], [place]  [address] Phone:  [phone]   Fax:  [phone]   New Patient Office Visit  Subjective:  Patient ID: Randy Morgan, male    DOB: 1965-09-29  Age: 54 y.o. MRN: [id number]  CC:  Chief Complaint  Patient presents with  . New Patient (Initial Visit)    est care, having swelling ankle  and feet, notice last couple of weeks     HPI Randy Morgan presents to establish care. He  has a past medical history of Hypertension.   Hypertension Patient is here for elevated blood pressure. He had established care with an provider however he was never able to start treatment for his HTN. This was in 2019 and then with the pandemic last year he was never able to follow up.  He is not exercising and is adherent to a low-salt diet. Blood pressure is not well controlled at home. Cardiac symptoms: chest pain, dyspnea and lower extremity edema. Patient denies irregular heart beat and syncope. Cardiovascular risk factors: family history of premature cardiovascular disease, hypertension, male gender, obesity (BMI >= 30 kg/m2), sedentary lifestyle and smoking/ tobacco exposure. Use of agents associated with hypertension: none. History of target organ damage:  angina/ prior myocardial infarction. He admits that he had COV-19 and "his breathing issues have not resolved". However he is aware the he also has untreated HTN with edema.  Past Medical History:  Diagnosis Date  . Hypertension     Past Surgical History:  Procedure Laterality Date  . NO PAST SURGERIES    . VIDEO BRONCHOSCOPY Bilateral 12/09/2017   Procedure: VIDEO BRONCHOSCOPY WITHOUT FLUORO;  Surgeon: Laurin Coder, MD;  Location: [facility];  Service: Cardiopulmonary;  Laterality: Bilateral;    Family History  Problem Relation Age of Onset  . Heart disease Mother   . Diabetes Mother   . Asthma Mother   . Heart disease Father   . Asthma Sister   . Heart disease  Brother   . Heart attack Brother     Social History   Socioeconomic History  . Marital status: Married    Spouse name: Not on file  . Number of children: 2  . Years of education: 12th grade + 2 years college  . Highest education level: Not on file  Occupational History  . Occupation: makes concrete blocks and bricks  Tobacco Use  . Smoking status: Current Some Day Smoker    Packs/day: 0.50    Years: 25.00    Pack years: 12.50    Types: Cigarettes  . Smokeless tobacco: Never Used  Vaping Use  . Vaping Use: Never used  Substance and Sexual Activity  . Alcohol use: Not Currently    Alcohol/week: 2.0 standard drinks    Types: 2 Cans of beer per week  . Drug use: No  . Sexual activity: Not Currently  Other Topics Concern  . Not on file  Social History Narrative   Lives with his wife and daughter.   Other child lives independently in [place].   Drinks about 3 sodas a day    Social Determinants of Radio broadcast assistant Strain:   . Difficulty of Paying Living Expenses:   Food Insecurity:   . Worried About Charity fundraiser in the Last Year:   . Arboriculturist in the Last Year:   Transportation Needs:   . Film/video editor (Medical):   Marland Kitchen Lack of Transportation (  Non-Medical):   Physical Activity:   . Days of Exercise per Week:   . Minutes of Exercise per Session:   Stress:   . Feeling of Stress :   Social Connections:   . Frequency of Communication with Friends and Family:   . Frequency of Social Gatherings with Friends and Family:   . Attends Religious Services:   . Active Member of Clubs or Organizations:   . Attends Archivist Meetings:   Marland Kitchen Marital Status:   Intimate Partner Violence:   . Fear of Current or Ex-Partner:   . Emotionally Abused:   Marland Kitchen Physically Abused:   . Sexually Abused:     ROS Review of Systems  Objective:   Today's Vitals: BP (!) 162/102   Pulse 88   Temp 97.6 F (36.4 C) (Temporal)   Ht 5' 6.5" (1.689 m)    Wt (!) 321 lb (145.6 kg)   SpO2 96%   BMI 51.03 kg/m   Physical Exam Constitutional:      Appearance: He is obese.  HENT:     Head: Normocephalic and atraumatic.     Mouth/Throat:     Comments: Dental caries  Cardiovascular:     Rate and Rhythm: Normal rate and regular rhythm.     Pulses: Normal pulses.     Heart sounds: Normal heart sounds.  Pulmonary:     Effort: Pulmonary effort is normal.     Breath sounds: Normal breath sounds.  Abdominal:     General: Bowel sounds are normal.     Palpations: Abdomen is soft.  Musculoskeletal:     Cervical back: Normal range of motion.     Right lower leg: Edema present.     Left lower leg: Edema present.     Comments: 1+  Skin:    General: Skin is warm and dry.     Capillary Refill: Capillary refill takes less than 2 seconds.  Neurological:     Mental Status: He is alert and oriented to person, place, and time.  Psychiatric:        Mood and Affect: Mood normal.        Behavior: Behavior normal.        Thought Content: Thought content normal.        Judgment: Judgment normal.     Assessment & Plan:   Problem List Items Addressed This Visit      Cardiovascular and Mediastinum   Essential hypertension, benign - Primary   Relevant Medications   cloNIDine (CATAPRES) tablet 0.2 mg   furosemide (LASIX) 20 MG tablet   lisinopril (ZESTRIL) 20 MG tablet   hydrochlorothiazide (HYDRODIURIL) 25 MG tablet   Other Relevant Orders   Comp. 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 albuterol (PROVENTIL HFA;VENTOLIN HFA) 108 (90 Base) MCG/ACT inhaler Inhale 2 puffs into the lungs every 4 (four) hours as needed for wheezing or shortness of breath (cough, shortness of breath or wheezing.). (Patient not taking: Reported on 07/16/2019)  . furosemide (LASIX) 20 MG tablet Take 1 tablet (20 mg total) by mouth daily for 5 days.  . hydrochlorothiazide (HYDRODIURIL) 25 MG tablet Take 1 tablet (25 mg total) by mouth daily.  Marland Kitchen ipratropium (ATROVENT) 0.03 % nasal spray Place 2 sprays into both nostrils 2 (two) times daily. (Patient not taking: Reported on 07/16/2019)  . lisinopril (ZESTRIL) 20 MG tablet Take 1 tablet (20 mg total) by mouth daily.  . [DISCONTINUED] hydrochlorothiazide (HYDRODIURIL) 25 MG tablet Take 1 tablet (25 mg total) by mouth daily. (Patient not taking: Reported on 07/16/2019)  . [DISCONTINUED] lisinopril (PRINIVIL,ZESTRIL) 20 MG tablet Take 1 tablet (20 mg total) by mouth daily. (Patient not taking: Reported on 07/16/2019)  . [DISCONTINUED] predniSONE (DELTASONE) 20 MG tablet Take 1 tablet (20 mg total) by mouth 2 (two) times daily with a meal.   Facility-Administered Encounter Medications as of 07/16/2019  Medication  . cloNIDine (CATAPRES) tablet 0.2 mg    Follow-up: Return in about 4 weeks (around 08/13/2019).   Vevelyn Francois, NP

## 2019-07-17 LAB — COMP. METABOLIC PANEL (12)
AST: 17 IU/L (ref 0–40)
Albumin/Globulin Ratio: 1.5 (ref 1.2–2.2)
Albumin: 4.1 g/dL (ref 3.8–4.9)
Alkaline Phosphatase: 66 IU/L (ref 48–121)
BUN/Creatinine Ratio: 16 (ref 9–20)
BUN: 18 mg/dL (ref 6–24)
Bilirubin Total: 0.5 mg/dL (ref 0.0–1.2)
Calcium: 9.4 mg/dL (ref 8.7–10.2)
Chloride: 103 mmol/L (ref 96–106)
Creatinine, Ser: 1.1 mg/dL (ref 0.76–1.27)
GFR calc Af Amer: 88 mL/min/{1.73_m2} (ref >59)
GFR calc non Af Amer: 76 mL/min/{1.73_m2} (ref >59)
Globulin, Total: 2.8 g/dL (ref 1.5–4.5)
Glucose: 97 mg/dL (ref 65–99)
Potassium: 3.8 mmol/L (ref 3.5–5.2)
Sodium: 142 mmol/L (ref 134–144)
Total Protein: 6.9 g/dL (ref 6.0–8.5)

## 2019-07-17 LAB — CBC WITH DIFFERENTIAL/PLATELET
Basophils Absolute: 0 10*3/uL (ref 0.0–0.2)
Basos: 0 %
EOS (ABSOLUTE): 0.1 10*3/uL (ref 0.0–0.4)
Eos: 2 %
Hematocrit: 44.6 % (ref 37.5–51.0)
Hemoglobin: 14.9 g/dL (ref 13.0–17.7)
Immature Grans (Abs): 0 10*3/uL (ref 0.0–0.1)
Immature Granulocytes: 0 %
Lymphocytes Absolute: 2.4 10*3/uL (ref 0.7–3.1)
Lymphs: 45 %
MCH: 29.2 pg (ref 26.6–33.0)
MCHC: 33.4 g/dL (ref 31.5–35.7)
MCV: 88 fL (ref 79–97)
Monocytes Absolute: 0.6 10*3/uL (ref 0.1–0.9)
Monocytes: 10 %
Neutrophils Absolute: 2.4 10*3/uL (ref 1.4–7.0)
Neutrophils: 43 %
Platelets: 204 10*3/uL (ref 150–450)
RBC: 5.1 x10E6/uL (ref 4.14–5.80)
RDW: 13.8 % (ref 11.6–15.4)
WBC: 5.5 10*3/uL (ref 3.4–10.8)

## 2019-07-17 LAB — MICROALBUMIN / CREATININE URINE RATIO
Creatinine, Urine: 214.9 mg/dL
Microalb/Creat Ratio: 6 mg/g creat (ref 0–29)
Microalbumin, Urine: 12.6 ug/mL

## 2019-07-17 LAB — BRAIN NATRIURETIC PEPTIDE: BNP: 51.2 pg/mL (ref 0.0–100.0)

## 2019-07-17 LAB — PSA: Prostate Specific Ag, Serum: 0.3 ng/mL (ref 0.0–4.0)

## 2019-08-13 ENCOUNTER — Ambulatory Visit (INDEPENDENT_AMBULATORY_CARE_PROVIDER_SITE_OTHER): Payer: 59 | Admitting: Nurse Practitioner

## 2019-08-13 ENCOUNTER — Other Ambulatory Visit: Payer: Self-pay

## 2019-08-13 ENCOUNTER — Encounter: Payer: Self-pay | Admitting: Nurse Practitioner

## 2019-08-13 VITALS — BP 175/108 | HR 95 | Temp 97.9°F | Ht 66.5 in | Wt 314.0 lb

## 2019-08-13 DIAGNOSIS — I10 Essential (primary) hypertension: Secondary | ICD-10-CM | POA: Diagnosis not present

## 2019-08-13 MED ORDER — HYDROCHLOROTHIAZIDE 25 MG PO TABS: 25.0000 mg | ORAL_TABLET | Freq: Every day | ORAL | 0 refills | Status: DC

## 2019-08-13 MED ORDER — CLONIDINE HCL 0.1 MG PO TABS
0.1000 mg | ORAL_TABLET | Freq: Once | ORAL | Status: AC
  Administered 2019-08-13: 0.2 mg via ORAL

## 2019-08-13 MED ORDER — LISINOPRIL 20 MG PO TABS: 20.0000 mg | ORAL_TABLET | Freq: Every day | ORAL | 0 refills | Status: DC

## 2019-08-13 NOTE — Patient Instructions (Signed)
   Managing Your Hypertension Hypertension is commonly called high blood pressure. This is when the force of your blood pressing against the walls of your arteries is too strong. Arteries are blood vessels that carry blood from your heart throughout your body. Hypertension forces the heart to work harder to pump blood, and may cause the arteries to become narrow or stiff. Having untreated or uncontrolled hypertension can cause heart attack, stroke, kidney disease, and other problems. What are blood pressure readings? A blood pressure reading consists of a higher number over a lower number. Ideally, your blood pressure should be below 120/80. The first ("top") number is called the systolic pressure. It is a measure of the pressure in your arteries as your heart beats. The second ("bottom") number is called the diastolic pressure. It is a measure of the pressure in your arteries as the heart relaxes. What does my blood pressure reading mean? Blood pressure is classified into four stages. Based on your blood pressure reading, your health care provider may use the following stages to determine what type of treatment you need, if any. Systolic pressure and diastolic pressure are measured in a unit called mm Hg. Normal  Systolic pressure: below 120.  Diastolic pressure: below 80. Elevated  Systolic pressure: 120-129.  Diastolic pressure: below 80. Hypertension stage 1  Systolic pressure: 130-139.  Diastolic pressure: 80-89. Hypertension stage 2  Systolic pressure: 140 or above.  Diastolic pressure: 90 or above. What health risks are associated with hypertension? Managing your hypertension is an important responsibility. Uncontrolled hypertension can lead to:  A heart attack.  A stroke.  A weakened blood vessel (aneurysm).  Heart failure.  Kidney damage.  Eye damage.  Metabolic syndrome.  Memory and concentration problems. What changes can I make to manage my  hypertension? Hypertension can be managed by making lifestyle changes and possibly by taking medicines. Your health care provider will help you make a plan to bring your blood pressure within a normal range. Eating and drinking   Eat a diet that is high in fiber and potassium, and low in salt (sodium), added sugar, and fat. An example eating plan is called the DASH (Dietary Approaches to Stop Hypertension) diet. To eat this way: ? Eat plenty of fresh fruits and vegetables. Try to fill half of your plate at each meal with fruits and vegetables. ? Eat whole grains, such as whole wheat pasta, brown rice, or whole grain bread. Fill about one quarter of your plate with whole grains. ? Eat low-fat diary products. ? Avoid fatty cuts of meat, processed or cured meats, and poultry with skin. Fill about one quarter of your plate with lean proteins such as fish, chicken without skin, beans, eggs, and tofu. ? Avoid premade and processed foods. These tend to be higher in sodium, added sugar, and fat.  Reduce your daily sodium intake. Most people with hypertension should eat less than 1,500 mg of sodium a day.  Limit alcohol intake to no more than 1 drink a day for nonpregnant women and 2 drinks a day for men. One drink equals 12 oz of beer, 5 oz of wine, or 1 oz of hard liquor. Lifestyle  Work with your health care provider to maintain a healthy body weight, or to lose weight. Ask what an ideal weight is for you.  Get at least 30 minutes of exercise that causes your heart to beat faster (aerobic exercise) most days of the week. Activities may include walking, swimming, or biking.    Include exercise to strengthen your muscles (resistance exercise), such as weight lifting, as part of your weekly exercise routine. Try to do these types of exercises for 30 minutes at least 3 days a week.  Do not use any products that contain nicotine or tobacco, such as cigarettes and e-cigarettes. If you need help quitting,  ask your health care provider.  Control any long-term (chronic) conditions you have, such as high cholesterol or diabetes. Monitoring  Monitor your blood pressure at home as told by your health care provider. Your personal target blood pressure may vary depending on your medical conditions, your age, and other factors.  Have your blood pressure checked regularly, as often as told by your health care provider. Working with your health care provider  Review all the medicines you take with your health care provider because there may be side effects or interactions.  Talk with your health care provider about your diet, exercise habits, and other lifestyle factors that may be contributing to hypertension.  Visit your health care provider regularly. Your health care provider can help you create and adjust your plan for managing hypertension. Will I need medicine to control my blood pressure? Your health care provider may prescribe medicine if lifestyle changes are not enough to get your blood pressure under control, and if:  Your systolic blood pressure is 130 or higher.  Your diastolic blood pressure is 80 or higher. Take medicines only as told by your health care provider. Follow the directions carefully. Blood pressure medicines must be taken as prescribed. The medicine does not work as well when you skip doses. Skipping doses also puts you at risk for problems. Contact a health care provider if:  You think you are having a reaction to medicines you have taken.  You have repeated (recurrent) headaches.  You feel dizzy.  You have swelling in your ankles.  You have trouble with your vision. Get help right away if:  You develop a severe headache or confusion.  You have unusual weakness or numbness, or you feel faint.  You have severe pain in your chest or abdomen.  You vomit repeatedly.  You have trouble breathing. Summary  Hypertension is when the force of blood pumping  through your arteries is too strong. If this condition is not controlled, it may put you at risk for serious complications.  Your personal target blood pressure may vary depending on your medical conditions, your age, and other factors. For most people, a normal blood pressure is less than 120/80.  Hypertension is managed by lifestyle changes, medicines, or both. Lifestyle changes include weight loss, eating a healthy, low-sodium diet, exercising more, and limiting alcohol. This information is not intended to replace advice given to you by your health care provider. Make sure you discuss any questions you have with your health care provider. Document Revised: 05/08/2018 Document Reviewed: 12/13/2015 Elsevier Patient Education  2020 Elsevier Inc.  

## 2019-08-13 NOTE — Progress Notes (Signed)
Randy Morgan, [place]  [address] Phone:  [phone]   Fax:  [phone]   Established Patient Office Visit  Subjective:  Patient ID: Randy Morgan, male    DOB: [birth year]  Age: 54 y.o. MRN: [id number]  CC:  Chief Complaint  Patient presents with   Follow-up    4 week follow up, Lasix helped swelling alot. Wants to dicuss starting nicotine patch    HPI Randy Morgan presents for follow up. He  has a past medical history of Hypertension.   Hypertension Patient is here for follow-up of elevated blood pressure. He is not exercising and is adherent to a low-salt diet. Blood pressure is improving at home. Cardiac symptoms: none.  He is encouraged because the lower extremity edema is resolved.  He has lost approximately 6 pounds.  Patient denies chest pain, dyspnea, fatigue, irregular heart beat, palpitations and syncope. Cardiovascular risk factors: hypertension, male gender, obesity (BMI >= 30 kg/m2), sedentary lifestyle and smoking/ tobacco exposure. Use of agents associated with hypertension: none. History of target organ damage: none.  He has not taken his medications today therefore his blood pressure is elevated.  He was also stressed because he worked 50 hours and was only compensated for 33 hours.  He has a sleep study scheduled for the end of the month    Past Medical History:  Diagnosis Date   Hypertension     Past Surgical History:  Procedure Laterality Date   NO PAST SURGERIES     VIDEO BRONCHOSCOPY Bilateral 12/09/2017   Procedure: VIDEO BRONCHOSCOPY WITHOUT FLUORO;  Surgeon: Laurin Coder, MD;  Location: [facility];  Service: Cardiopulmonary;  Laterality: Bilateral;    Family History  Problem Relation Age of Onset   Heart disease Mother    Diabetes Mother    Asthma Mother    Heart disease Father    Asthma Sister    Heart disease Brother    Heart attack Brother     Social History    Socioeconomic History   Marital status: Married    Spouse name: Not on file   Number of children: 2   Years of education: 12th grade + 2 years college   Highest education level: Not on file  Occupational History   Occupation: makes concrete blocks and bricks  Tobacco Use   Smoking status: Current Some Day Smoker    Packs/day: 0.50    Years: 25.00    Pack years: 12.50    Types: Cigarettes   Smokeless tobacco: Never Used  Scientific laboratory technician Use: Never used  Substance and Sexual Activity   Alcohol use: Not Currently    Alcohol/week: 2.0 standard drinks    Types: 2 Cans of beer per week   Drug use: No   Sexual activity: Not Currently  Other Topics Concern   Not on file  Social History Narrative   Lives with his wife and daughter.   Other child lives independently in [place].   Drinks about 3 sodas a day    Social Determinants of Radio broadcast assistant Strain:    Difficulty of Paying Living Expenses:   Food Insecurity:    Worried About Charity fundraiser in the Last Year:    Arboriculturist in the Last Year:   Transportation Needs:    Film/video editor (Medical):    Lack of Transportation (Non-Medical):   Physical Activity:    Days of Exercise  per Week:    Minutes of Exercise per Session:   Stress:    Feeling of Stress :   Social Connections:    Frequency of Communication with Friends and Family:    Frequency of Social Gatherings with Friends and Family:    Attends Religious Services:    Active Member of Clubs or Organizations:    Attends Music therapist:    Marital Status:   Intimate Partner Violence:    Fear of Current or Ex-Partner:    Emotionally Abused:    Physically Abused:    Sexually Abused:     Outpatient Medications Prior to Visit  Medication Sig Dispense Refill   albuterol (PROVENTIL HFA;VENTOLIN HFA) 108 (90 Base) MCG/ACT inhaler Inhale 2 puffs into the lungs every 4 (four) hours as needed  for wheezing or shortness of breath (cough, shortness of breath or wheezing.). 1 Inhaler 1   ipratropium (ATROVENT) 0.03 % nasal spray Place 2 sprays into both nostrils 2 (two) times daily. 30 mL 0   hydrochlorothiazide (HYDRODIURIL) 25 MG tablet Take 1 tablet (25 mg total) by mouth daily. 45 tablet 0   lisinopril (ZESTRIL) 20 MG tablet Take 1 tablet (20 mg total) by mouth daily. 45 tablet 0   furosemide (LASIX) 20 MG tablet Take 1 tablet (20 mg total) by mouth daily for 5 days. 5 tablet 0   Facility-Administered Medications Prior to Visit  Medication Dose Route Frequency Provider Last Rate Last Admin   cloNIDine (CATAPRES) tablet 0.2 mg  0.2 mg Oral Once Vevelyn Francois, NP        No Known Allergies  ROS Review of Systems  All other systems reviewed and are negative.     Objective:    Physical Exam Constitutional:      General: He is not in acute distress.    Appearance: He is obese.  HENT:     Head: Normocephalic and atraumatic.     Nose: Nose normal.     Mouth/Throat:     Mouth: Mucous membranes are moist.  Cardiovascular:     Rate and Rhythm: Normal rate and regular rhythm.     Pulses: Normal pulses.     Heart sounds: Normal heart sounds.  Pulmonary:     Effort: Pulmonary effort is normal.     Breath sounds: Normal breath sounds.  Musculoskeletal:        General: Normal range of motion.     Cervical back: Normal range of motion.  Skin:    General: Skin is warm and dry.  Neurological:     General: No focal deficit present.     Mental Status: He is alert and oriented to person, place, and time.  Psychiatric:        Mood and Affect: Mood normal.        Behavior: Behavior normal.        Thought Content: Thought content normal.        Judgment: Judgment normal.     BP (!) 175/108    Pulse 95    Temp 97.9 F (36.6 C)    Ht 5' 6.5" (1.689 m)    Wt (!) 314 lb (142.4 kg)    SpO2 96%    BMI 49.92 kg/m  Wt Readings from Last 3 Encounters:  08/13/19 (!) 314 lb  (142.4 kg)  07/16/19 (!) 321 lb (145.6 kg)  12/06/17 290 lb (131.5 kg)     Health Maintenance Due  Topic Date Due   Hepatitis  C Screening  Never done   COVID-19 Vaccine (1) Never done   COLONOSCOPY  Never done    There are no preventive care reminders to display for this patient.  Lab Results  Component Value Date   TSH 3.060 01/23/2016   Lab Results  Component Value Date   WBC 5.5 07/16/2019   HGB 14.9 07/16/2019   HCT 44.6 07/16/2019   MCV 88 07/16/2019   PLT 204 07/16/2019   Lab Results  Component Value Date   NA 143 08/13/2019   K 3.8 08/13/2019   CO2 33 (H) 12/09/2017   GLUCOSE 130 (H) 08/13/2019   BUN 14 08/13/2019   CREATININE 1.00 08/13/2019   BILITOT 0.7 08/13/2019   ALKPHOS 65 08/13/2019   AST 20 08/13/2019   ALT 14 12/09/2017   PROT 7.1 08/13/2019   ALBUMIN 4.3 08/13/2019   CALCIUM 9.2 08/13/2019   ANIONGAP 7 12/09/2017   No results found for: CHOL No results found for: HDL No results found for: LDLCALC No results found for: TRIG No results found for: CHOLHDL Lab Results  Component Value Date   HGBA1C 6.1 (A) 07/16/2019   HGBA1C 6.1 07/16/2019   HGBA1C 6.1 07/16/2019   HGBA1C 6.1 07/16/2019      Assessment & Plan:   Problem List Items Addressed This Visit      Cardiovascular and Mediastinum   Essential hypertension, benign - Primary   Relevant Medications   hydrochlorothiazide (HYDRODIURIL) 25 MG tablet   lisinopril (ZESTRIL) 20 MG tablet   Other Relevant Orders   Comp. Metabolic Panel (12) (Completed) Encourage patient with the importance of compliance with medication daily especially when he is in a stressful situation Encourage lifestyle modifications.  Dietary changes Regular daily exercise.  Patient will start off exercise and slowly because of his elevated blood pressure.  Patient was encouraged that he may be able to do into the gym with weightlifting when his blood pressure is under control    Other Visit Diagnoses     Elevated blood pressure reading in office with diagnosis of hypertension       Relevant Medications   cloNIDine (CATAPRES) tablet 0.1 mg (Completed)   hydrochlorothiazide (HYDRODIURIL) 25 MG tablet   lisinopril (ZESTRIL) 20 MG tablet      Meds ordered this encounter  Medications   cloNIDine (CATAPRES) tablet 0.1 mg   hydrochlorothiazide (HYDRODIURIL) 25 MG tablet    Sig: Take 1 tablet (25 mg total) by mouth daily.    Dispense:  90 tablet    Refill:  0    Order Specific Question:   Supervising Provider    Answer:   Tresa Garter [[id number]]   lisinopril (ZESTRIL) 20 MG tablet    Sig: Take 1 tablet (20 mg total) by mouth daily.    Dispense:  90 tablet    Refill:  0    Order Specific Question:   Supervising Provider    Answer:   Tresa Garter [id number]    Follow-up: Return in about 6 weeks (around 09/24/2019).    Vevelyn Francois, NP

## 2019-08-14 LAB — COMP. METABOLIC PANEL (12)
AST: 20 IU/L (ref 0–40)
Albumin/Globulin Ratio: 1.5 (ref 1.2–2.2)
Albumin: 4.3 g/dL (ref 3.8–4.9)
Alkaline Phosphatase: 65 IU/L (ref 48–121)
BUN/Creatinine Ratio: 14 (ref 9–20)
BUN: 14 mg/dL (ref 6–24)
Bilirubin Total: 0.7 mg/dL (ref 0.0–1.2)
Calcium: 9.2 mg/dL (ref 8.7–10.2)
Chloride: 101 mmol/L (ref 96–106)
Creatinine, Ser: 1 mg/dL (ref 0.76–1.27)
GFR calc Af Amer: 98 mL/min/{1.73_m2} (ref >59)
GFR calc non Af Amer: 85 mL/min/{1.73_m2} (ref >59)
Globulin, Total: 2.8 g/dL (ref 1.5–4.5)
Glucose: 130 mg/dL — ABNORMAL HIGH (ref 65–99)
Potassium: 3.8 mmol/L (ref 3.5–5.2)
Sodium: 143 mmol/L (ref 134–144)
Total Protein: 7.1 g/dL (ref 6.0–8.5)

## 2019-08-27 ENCOUNTER — Other Ambulatory Visit: Payer: Self-pay

## 2019-08-27 ENCOUNTER — Ambulatory Visit (HOSPITAL_BASED_OUTPATIENT_CLINIC_OR_DEPARTMENT_OTHER): Payer: 59 | Attending: Nurse Practitioner | Admitting: Internal Medicine

## 2019-08-27 VITALS — Ht 67.0 in | Wt 310.0 lb

## 2019-08-27 DIAGNOSIS — R0683 Snoring: Secondary | ICD-10-CM | POA: Insufficient documentation

## 2019-08-27 DIAGNOSIS — G4710 Hypersomnia, unspecified: Secondary | ICD-10-CM | POA: Diagnosis not present

## 2019-08-27 DIAGNOSIS — R29818 Other symptoms and signs involving the nervous system: Secondary | ICD-10-CM

## 2019-09-12 ENCOUNTER — Other Ambulatory Visit: Payer: Self-pay | Admitting: Nurse Practitioner

## 2019-09-12 DIAGNOSIS — R29818 Other symptoms and signs involving the nervous system: Secondary | ICD-10-CM | POA: Diagnosis not present

## 2019-09-12 DIAGNOSIS — G4733 Obstructive sleep apnea (adult) (pediatric): Secondary | ICD-10-CM

## 2019-09-12 NOTE — Progress Notes (Signed)
   [place], [place]  [address] Phone:  [phone]   Fax:  [phone]   Sleep study on 08/27/19 IMPRESSIONS - Severe obstructive sleep apnea occurred during this study (AHI = 80.6/h). - No significant central sleep apnea occurred during this study (CAI = 0.0/h). - Severe oxygen desaturation was noted during this study (Min O2 = 68.00%). Mean sat 88.4%. - The patient snored with loud snoring volume. - No cardiac abnormalities were noted during this study. - Clinically significant periodic limb movements did not occur during sleep. No significant associated arousals.  DIAGNOSIS - Obstructive Sleep Apnea (G47.33) - Nocturnal Hypoxemia (G47.36   RECOMMENDATIONS - Recommend in-center CPAP titration. This will document control of apnea and also if supplemental oxygen should be considered.

## 2019-09-12 NOTE — Procedures (Signed)
Patient Name: Randy Morgan, Randy Morgan Date: 08/27/2019 Gender: Male D.O.B: 12-Sep-1965 Age (years): 89 Referring Provider: Dionisio David NP Height (inches): 67 Interpreting Physician: Baird Lyons MD, ABSM Weight (lbs): 310 RPSGT: Earney [place] BMI: 49 MRN: [id number] Neck Size: 19.00  CLINICAL INFORMATION Sleep Study Type: NPSG Indication for sleep study: Daytime Fatigue, Snoring Epworth Sleepiness Score: 22  SLEEP STUDY TECHNIQUE As per the AASM Manual for the Scoring of Sleep and Associated Events v2.3 (April 2016) with a hypopnea requiring 4% desaturations.  The channels recorded and monitored were frontal, central and occipital EEG, electrooculogram (EOG), submentalis EMG (chin), nasal and oral airflow, thoracic and abdominal wall motion, anterior tibialis EMG, snore microphone, electrocardiogram, and pulse oximetry.  MEDICATIONS Medications self-administered by patient taken the night of the study : none reported  SLEEP ARCHITECTURE The study was initiated at 10:55:37 PM and ended at 5:12:21 AM.  Sleep onset time was 9.4 minutes and the sleep efficiency was 79.6%. The total sleep time was 299.8 minutes.  Stage REM latency was 109.5 minutes.  The patient spent 30.85% of the night in stage N1 sleep, 56.81% in stage N2 sleep, 0.00% in stage N3 and 12.3% in REM.  Alpha intrusion was absent.  Supine sleep was 64.82%.  RESPIRATORY PARAMETERS The overall apnea/hypopnea index (AHI) was 80.6 per hour. There were 334 total apneas, including 334 obstructive, 0 central and 0 mixed apneas. There were 69 hypopneas and 15 RERAs.  The AHI during Stage REM sleep was 81.1 per hour.  AHI while supine was 81.5 per hour.  The mean oxygen saturation was 87.70%. The minimum SpO2 during sleep was 68.00%.  loud snoring was noted during this study.  CARDIAC DATA The 2 lead EKG demonstrated sinus rhythm. The mean heart rate was 74.39 beats per minute. Other EKG findings  include: None.  LEG MOVEMENT DATA The total PLMS were 0 with a resulting PLMS index of 0.00. Associated arousal with leg movement index was 0.0 .  IMPRESSIONS - Severe obstructive sleep apnea occurred during this study (AHI = 80.6/h). - No significant central sleep apnea occurred during this study (CAI = 0.0/h). - Severe oxygen desaturation was noted during this study (Min O2 = 68.00%). Mean sat 88.4%. - The patient snored with loud snoring volume. - No cardiac abnormalities were noted during this study. - Clinically significant periodic limb movements did not occur during sleep. No significant associated arousals.  DIAGNOSIS - Obstructive Sleep Apnea (G47.33) - Nocturnal Hypoxemia (G47.36)  RECOMMENDATIONS - Recommend in-center CPAP titration. This will document control of apnea and also if supplemental oxygen should be considered. 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 The sleep study can be scheduled for day-time to match his usual sleep schedule, if desired. - Be careful with alcohol, sedatives and other CNS depressants that may worsen sleep apnea and disrupt normal sleep architecture. - Sleep hygiene should be reviewed to assess factors that may improve sleep quality. - Weight management and regular exercise should be initiated or continued if appropriate.  [Electronically signed] 09/12/2019 09:31 AM  Baird Lyons MD, ABSM Diplomate, American Board of Sleep Medicine   NPI: [id number]                         [place], [place] [facility]  ELECTRONICALLY SIGNED ON:  09/12/2019, 9:27 AM [place] PH: [phone]   FX: [phone] [place]

## 2019-09-21 ENCOUNTER — Telehealth: Payer: Self-pay

## 2019-09-21 NOTE — Telephone Encounter (Signed)
Called Sleep disorder clinic lvm for clinic to schedule patient an appointment to be seen

## 2019-09-22 ENCOUNTER — Other Ambulatory Visit: Payer: Self-pay

## 2019-09-22 ENCOUNTER — Encounter: Payer: Self-pay | Admitting: Nurse Practitioner

## 2019-09-22 ENCOUNTER — Ambulatory Visit (INDEPENDENT_AMBULATORY_CARE_PROVIDER_SITE_OTHER): Payer: 59 | Admitting: Nurse Practitioner

## 2019-09-22 ENCOUNTER — Ambulatory Visit (HOSPITAL_COMMUNITY)
Admission: RE | Admit: 2019-09-22 | Discharge: 2019-09-22 | Disposition: A | Discharge status: Home / Self Care | Payer: 59 | Source: Ambulatory Visit | Attending: Nurse Practitioner | Admitting: Nurse Practitioner

## 2019-09-22 VITALS — BP 166/93 | HR 91 | Temp 97.8°F | Resp 20 | Ht 66.0 in | Wt 311.0 lb

## 2019-09-22 DIAGNOSIS — Z1159 Encounter for screening for other viral diseases: Secondary | ICD-10-CM

## 2019-09-22 DIAGNOSIS — R2232 Localized swelling, mass and lump, left upper limb: Secondary | ICD-10-CM | POA: Insufficient documentation

## 2019-09-22 DIAGNOSIS — I10 Essential (primary) hypertension: Secondary | ICD-10-CM

## 2019-09-22 DIAGNOSIS — Z6841 Body Mass Index (BMI) 40.0 and over, adult: Secondary | ICD-10-CM

## 2019-09-22 DIAGNOSIS — E661 Drug-induced obesity: Secondary | ICD-10-CM

## 2019-09-22 DIAGNOSIS — Z13220 Encounter for screening for lipoid disorders: Secondary | ICD-10-CM

## 2019-09-22 DIAGNOSIS — R062 Wheezing: Secondary | ICD-10-CM

## 2019-09-22 DIAGNOSIS — Z1211 Encounter for screening for malignant neoplasm of colon: Secondary | ICD-10-CM

## 2019-09-22 DIAGNOSIS — G4733 Obstructive sleep apnea (adult) (pediatric): Secondary | ICD-10-CM | POA: Diagnosis not present

## 2019-09-22 IMAGING — DX DG ELBOW COMPLETE 3+V*L*
4 series · 4 of 4 positions shown · non-contrast
Comparison: None.

CLINICAL DATA: Left elbow swelling.

EXAM:
LEFT ELBOW - COMPLETE 3+ VIEW

[elbow ap]
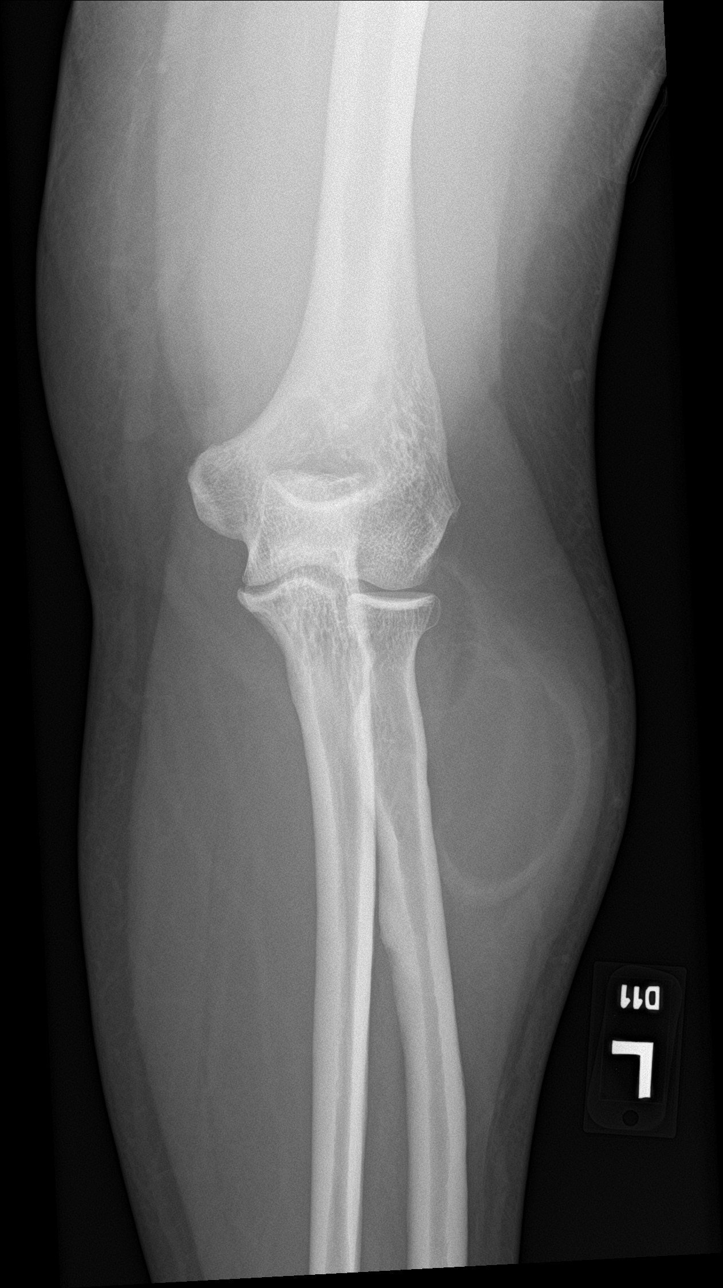

[elbow obl (1 of 2)]
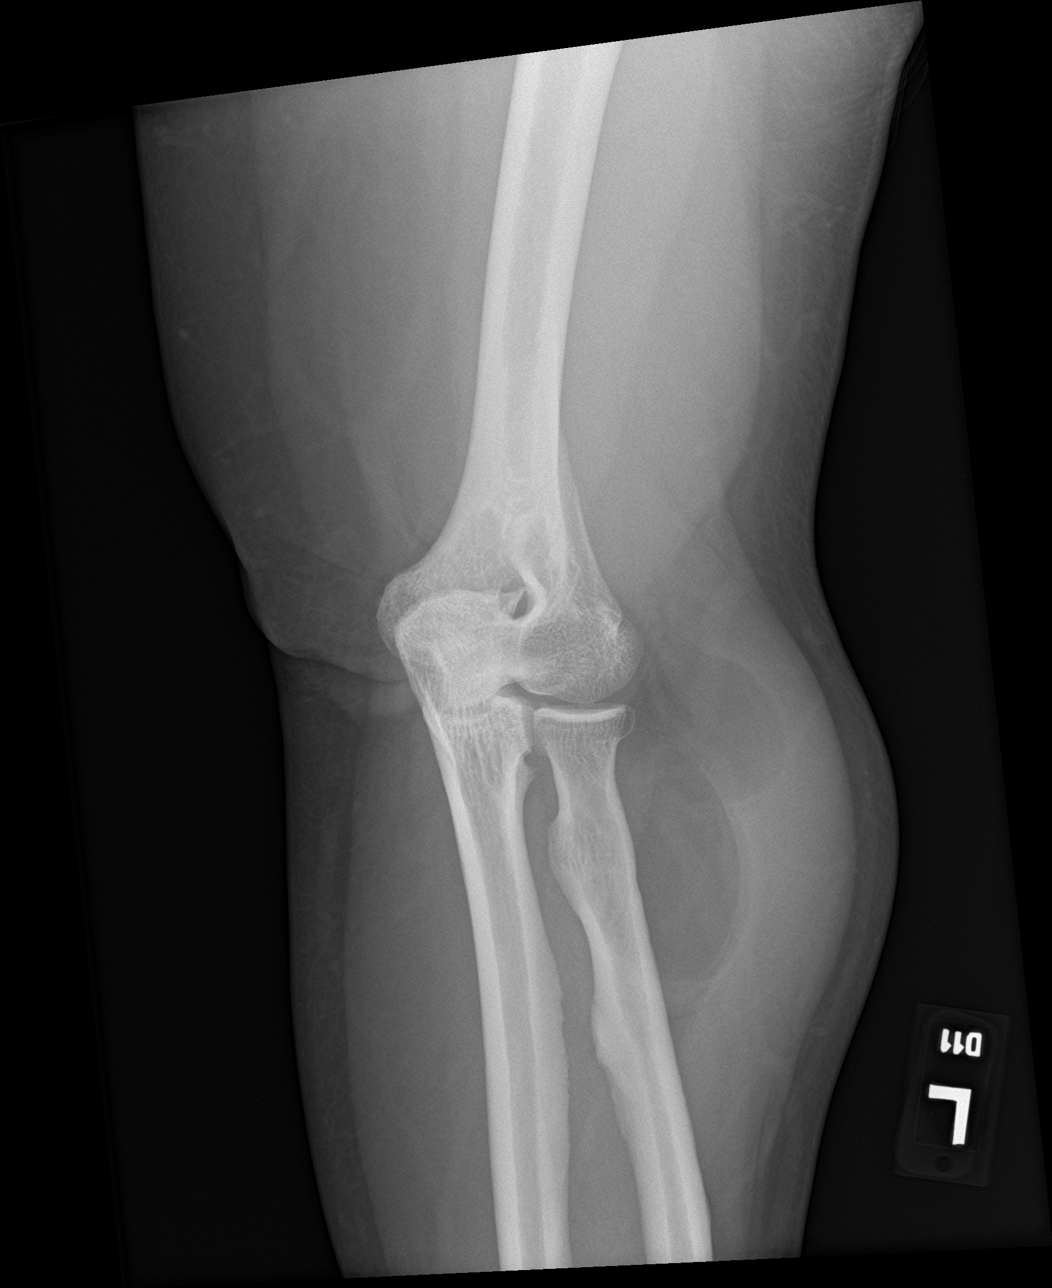

[elbow obl (2 of 2)]
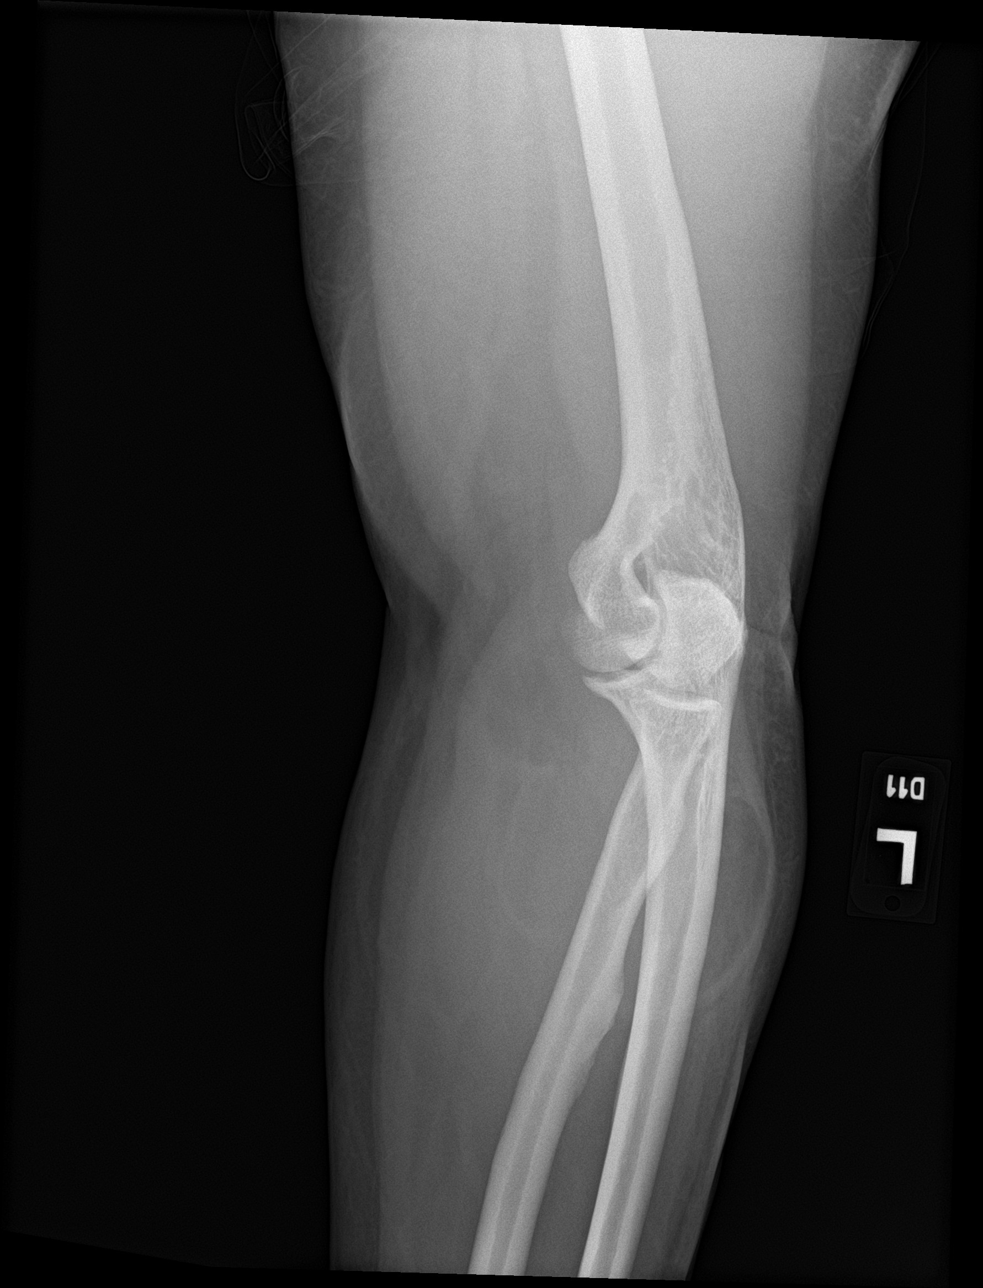

[elbow lat]
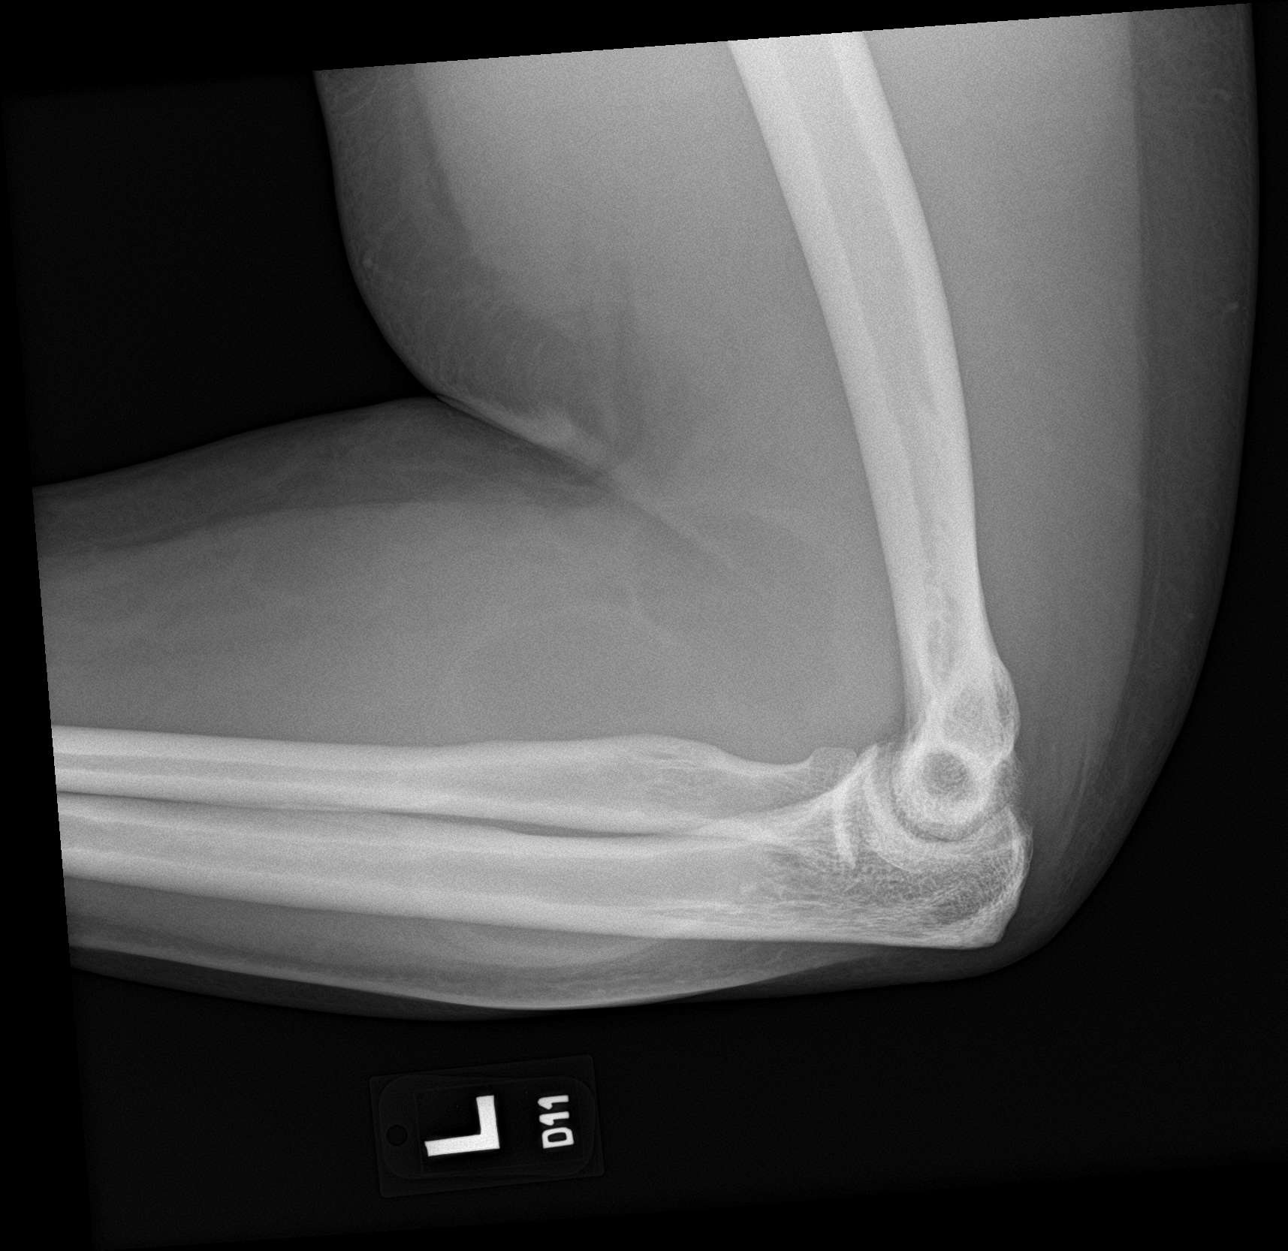

[4 of 4 positions shown; findings below may reference images not displayed]

FINDINGS: There is no evidence of fracture, dislocation, or joint effusion.
There is no evidence of arthropathy or other focal bone abnormality.
There is a large masslike soft tissue density containing internal
fat attenuation in the ventral aspect of the proximal forearm. This
is suspicious for a fat-containing neoplasm such as a lipoma or
liposarcoma.
IMPRESSION: Large masslike soft tissue density containing internal fat
attenuation in the proximal forearm, suspicious for a fat-containing
neoplasm such as a lipoma or liposarcoma. MRI is recommended for
further evaluation.

## 2019-09-22 MED ORDER — ALBUTEROL SULFATE HFA 108 (90 BASE) MCG/ACT IN AERS: 2.0000 | INHALATION_SPRAY | RESPIRATORY_TRACT | 3 refills | Status: DC | PRN

## 2019-09-22 MED ORDER — AMLODIPINE BESYLATE 5 MG PO TABS
5.0000 mg | ORAL_TABLET | Freq: Every day | ORAL | 3 refills | Status: DC
Start: 2019-09-22 — End: 2020-03-03

## 2019-09-22 MED ORDER — NICOTINE 21 MG/24HR TD PT24
21.0000 mg | MEDICATED_PATCH | Freq: Every day | TRANSDERMAL | 0 refills | Status: DC
Start: 2019-09-22 — End: 2019-11-12

## 2019-09-22 NOTE — Patient Instructions (Signed)
Steps to Quit Smoking Smoking tobacco is the leading cause of preventable death. It can affect almost every organ in the body. Smoking puts you and people around you at risk for many serious, long-lasting (chronic) diseases. Quitting smoking can be hard, but it is one of the best things that you can do for your health. It is never too late to quit. How do I get ready to quit? When you decide to quit smoking, make a plan to help you succeed. Before you quit:  Pick a date to quit. Set a date within the next 2 weeks to give you time to prepare.  Write down the reasons why you are quitting. Keep this list in places where you will see it often.  Tell your family, friends, and co-workers that you are quitting. Their support is important.  Talk with your doctor about the choices that may help you quit.  Find out if your health insurance will pay for these treatments.  Know the people, places, things, and activities that make you want to smoke (triggers). Avoid them. What first steps can I take to quit smoking?  Throw away all cigarettes at home, at work, and in your car.  Throw away the things that you use when you smoke, such as ashtrays and lighters.  Clean your car. Make sure to empty the ashtray.  Clean your home, including curtains and carpets. What can I do to help me quit smoking? Talk with your doctor about taking medicines and seeing a counselor at the same time. You are more likely to succeed when you do both.  If you are pregnant or breastfeeding, talk with your doctor about counseling or other ways to quit smoking. Do not take medicine to help you quit smoking unless your doctor tells you to do so. To quit smoking: Quit right away  Quit smoking totally, instead of slowly cutting back on how much you smoke over a period of time.  Go to counseling. You are more likely to quit if you go to counseling sessions regularly. Take medicine You may take medicines to help you quit. Some  medicines need a prescription, and some you can buy over-the-counter. Some medicines may contain a drug called nicotine to replace the nicotine in cigarettes. Medicines may:  Help you to stop having the desire to smoke (cravings).  Help to stop the problems that come when you stop smoking (withdrawal symptoms). Your doctor may ask you to use:  Nicotine patches, gum, or lozenges.  Nicotine inhalers or sprays.  Non-nicotine medicine that is taken by mouth. Find resources Find resources and other ways to help you quit smoking and remain smoke-free after you quit. These resources are most helpful when you use them often. They include:  Online chats with a counselor.  Phone quitlines.  Printed self-help materials.  Support groups or group counseling.  Text messaging programs.  Mobile phone apps. Use apps on your mobile phone or tablet that can help you stick to your quit plan. There are many free apps for mobile phones and tablets as well as websites. Examples include Quit Guide from the CDC and smokefree.gov  What things can I do to make it easier to quit?   Talk to your family and friends. Ask them to support and encourage you.  Call a phone quitline (1-800-QUIT-NOW), reach out to support groups, or work with a counselor.  Ask people who smoke to not smoke around you.  Avoid places that make you want to smoke,   such as: ? Bars. ? Parties. ? Smoke-break areas at work.  Spend time with people who do not smoke.  Lower the stress in your life. Stress can make you want to smoke. Try these things to help your stress: ? Getting regular exercise. ? Doing deep-breathing exercises. ? Doing yoga. ? Meditating. ? Doing a body scan. To do this, close your eyes, focus on one area of your body at a time from head to toe. Notice which parts of your body are tense. Try to relax the muscles in those areas. How will I feel when I quit smoking? Day 1 to 3 weeks Within the first 24 hours,  you may start to have some problems that come from quitting tobacco. These problems are very bad 2-3 days after you quit, but they do not often last for more than 2-3 weeks. You may get these symptoms:  Mood swings.  Feeling restless, nervous, angry, or annoyed.  Trouble concentrating.  Dizziness.  Strong desire for high-sugar foods and nicotine.  Weight gain.  Trouble pooping (constipation).  Feeling like you may vomit (nausea).  Coughing or a sore throat.  Changes in how the medicines that you take for other issues work in your body.  Depression.  Trouble sleeping (insomnia). Week 3 and afterward After the first 2-3 weeks of quitting, you may start to notice more positive results, such as:  Better sense of smell and taste.  Less coughing and sore throat.  Slower heart rate.  Lower blood pressure.  Clearer skin.  Better breathing.  Fewer sick days. Quitting smoking can be hard. Do not give up if you fail the first time. Some people need to try a few times before they succeed. Do your best to stick to your quit plan, and talk with your doctor if you have any questions or concerns. Summary  Smoking tobacco is the leading cause of preventable death. Quitting smoking can be hard, but it is one of the best things that you can do for your health.  When you decide to quit smoking, make a plan to help you succeed.  Quit smoking right away, not slowly over a period of time.  When you start quitting, seek help from your doctor, family, or friends. This information is not intended to replace advice given to you by your health care provider. Make sure you discuss any questions you have with your health care provider. Document Revised: 10/09/2018 Document Reviewed: 04/04/2018 Elsevier Patient Education  2020 Elsevier Inc.  

## 2019-09-22 NOTE — Progress Notes (Signed)
[address], [place]  [address] Phone:  [phone]   Fax:  [phone]    Established Patient Office Visit  Subjective:  Patient ID: Randy Morgan, male    DOB: 1965-07-01  Age: 54 y.o. MRN: [id number]  CC:  Chief Complaint  Patient presents with   Follow-up    HPI Randy Morgan presents for follow up. He  has a past medical history of Hypertension.   Hypertension Patient is here for follow-up of elevated blood pressure. He is not exercising and is adherent to a low-salt diet.  He is making some dietary changes.  Blood pressure is not monitored at home because he is unable to find the accurate size blood pressure monitor.. Cardiac symptoms: Exertional shortness of breath. Patient denies chest pain, dyspnea, fatigue, irregular heart beat, lower extremity edema, palpitations and syncope. Cardiovascular risk factors: advanced age (older than 46 for men, 34 for women), hypertension, male gender, obesity (BMI >= 30 kg/m2), sedentary lifestyle and smoking/ tobacco exposure. Use of agents associated with hypertension: none. History of target organ damage: Obstructive sleep apnea.  Smoking Cessation He presents for discussion regarding smoking cessation.  He began smoking 25 years ago. He currently smokes 0.5 packs per day.  He has attempted to quit smoking in the past, most recently a few months ago. Best success quitting using willpower. Barriers to quitting include fear of failing again, friends smoke and Gets more breaks at work. He denies constant cough, productive cough, tightness in chest, unresolving pneumonia and wheezing.  Past Medical History:  Diagnosis Date   Hypertension     Past Surgical History:  Procedure Laterality Date   NO PAST SURGERIES     VIDEO BRONCHOSCOPY Bilateral 12/09/2017   Procedure: VIDEO BRONCHOSCOPY WITHOUT FLUORO;  Surgeon: Laurin Coder, MD;  Location: [facility];  Service: Cardiopulmonary;   Laterality: Bilateral;    Family History  Problem Relation Age of Onset   Heart disease Mother    Diabetes Mother    Asthma Mother    Heart disease Father    Asthma Sister    Heart disease Brother    Heart attack Brother     Social History   Socioeconomic History   Marital status: Married    Spouse name: Not on file   Number of children: 2   Years of education: 12th grade + 2 years college   Highest education level: Not on file  Occupational History   Occupation: makes concrete blocks and bricks  Tobacco Use   Smoking status: Current Some Day Smoker    Packs/day: 0.50    Years: 25.00    Pack years: 12.50    Types: Cigarettes   Smokeless tobacco: Never Used  Scientific laboratory technician Use: Never used  Substance and Sexual Activity   Alcohol use: Not Currently    Alcohol/week: 2.0 standard drinks    Types: 2 Cans of beer per week   Drug use: No   Sexual activity: Not Currently  Other Topics Concern   Not on file  Social History Narrative   Lives with his wife and daughter.   Other child lives independently in [place].   Drinks about 3 sodas a day    Social Determinants of Radio broadcast assistant Strain:    Difficulty of Paying Living Expenses: Not on file  Food Insecurity:    Worried About Charity fundraiser in the Last Year: Not on file  Ran Out of Food in the Last Year: Not on file  Transportation Needs:    Lack of Transportation (Medical): Not on file   Lack of Transportation (Non-Medical): Not on file  Physical Activity:    Days of Exercise per Week: Not on file   Minutes of Exercise per Session: Not on file  Stress:    Feeling of Stress : Not on file  Social Connections:    Frequency of Communication with Friends and Family: Not on file   Frequency of Social Gatherings with Friends and Family: Not on file   Attends Religious Services: Not on file   Active Member of Clubs or Organizations: Not on file   Attends Theatre manager Meetings: Not on file   Marital Status: Not on file  Intimate Partner Violence:    Fear of Current or Ex-Partner: Not on file   Emotionally Abused: Not on file   Physically Abused: Not on file   Sexually Abused: Not on file    Outpatient Medications Prior to Visit  Medication Sig Dispense Refill   hydrochlorothiazide (HYDRODIURIL) 25 MG tablet Take 1 tablet (25 mg total) by mouth daily. 90 tablet 0   ipratropium (ATROVENT) 0.03 % nasal spray Place 2 sprays into both nostrils 2 (two) times daily. 30 mL 0   lisinopril (ZESTRIL) 20 MG tablet Take 1 tablet (20 mg total) by mouth daily. 90 tablet 0   albuterol (PROVENTIL HFA;VENTOLIN HFA) 108 (90 Base) MCG/ACT inhaler Inhale 2 puffs into the lungs every 4 (four) hours as needed for wheezing or shortness of breath (cough, shortness of breath or wheezing.). 1 Inhaler 1   furosemide (LASIX) 20 MG tablet Take 1 tablet (20 mg total) by mouth daily for 5 days. 5 tablet 0   Facility-Administered Medications Prior to Visit  Medication Dose Route Frequency Provider Last Rate Last Admin   cloNIDine (CATAPRES) tablet 0.2 mg  0.2 mg Oral Once Vevelyn Francois, NP        No Known Allergies  ROS Review of Systems  Constitutional: Negative.   HENT: Negative.   Eyes: Negative.       Objective:    Physical Exam Constitutional:      General: He is not in acute distress.    Appearance: He is obese. He is not ill-appearing, toxic-appearing or diaphoretic.  HENT:     Head: Normocephalic and atraumatic.     Nose: Nose normal.     Mouth/Throat:     Mouth: Mucous membranes are moist.  Cardiovascular:     Rate and Rhythm: Normal rate and regular rhythm.     Pulses: Normal pulses.     Heart sounds: Normal heart sounds.  Pulmonary:     Effort: Pulmonary effort is normal.     Breath sounds: Normal breath sounds.  Abdominal:     Palpations: Abdomen is soft.  Musculoskeletal:     Left elbow: Swelling and deformity present.  Decreased range of motion.     Left hand: Decreased strength of finger abduction. Decreased sensation. There is disruption of two-point discrimination.     Cervical back: Normal range of motion.     Right lower leg: No edema.     Left lower leg: No edema.  Skin:    General: Skin is warm and dry.     Capillary Refill: Capillary refill takes less than 2 seconds.  Neurological:     Mental Status: He is alert.     BP (!) 166/93  Pulse 91    Temp 97.8 F (36.6 C)    Resp 20    Ht 5\' 6"  (1.676 m)    Wt (!) 311 lb (141.1 kg)    SpO2 96%    BMI 50.20 kg/m  Wt Readings from Last 3 Encounters:  09/22/19 (!) 311 lb (141.1 kg)  08/27/19 (!) 310 lb (140.6 kg)  08/13/19 (!) 314 lb (142.4 kg)     Health Maintenance Due  Topic Date Due   Hepatitis C Screening  Never done   COVID-19 Vaccine (1) Never done   COLONOSCOPY  Never done   INFLUENZA VACCINE  08/29/2019    There are no preventive care reminders to display for this patient.  Lab Results  Component Value Date   TSH 3.060 01/23/2016   Lab Results  Component Value Date   WBC 5.5 07/16/2019   HGB 14.9 07/16/2019   HCT 44.6 07/16/2019   MCV 88 07/16/2019   PLT 204 07/16/2019   Lab Results  Component Value Date   NA 143 08/13/2019   K 3.8 08/13/2019   CO2 33 (H) 12/09/2017   GLUCOSE 130 (H) 08/13/2019   BUN 14 08/13/2019   CREATININE 1.00 08/13/2019   BILITOT 0.7 08/13/2019   ALKPHOS 65 08/13/2019   AST 20 08/13/2019   ALT 14 12/09/2017   PROT 7.1 08/13/2019   ALBUMIN 4.3 08/13/2019   CALCIUM 9.2 08/13/2019   ANIONGAP 7 12/09/2017   No results found for: CHOL No results found for: HDL No results found for: LDLCALC No results found for: TRIG No results found for: CHOLHDL Lab Results  Component Value Date   HGBA1C 6.1 (A) 07/16/2019   HGBA1C 6.1 07/16/2019   HGBA1C 6.1 07/16/2019   HGBA1C 6.1 07/16/2019      Assessment & Plan:   Problem List Items Addressed This Visit      Cardiovascular and  Mediastinum   Essential hypertension, benign Encouraged on going compliance with current medication regimen Amlodipine 5 mg daily added to current regimen especially due to patient's tobacco dependence and fluctuation in weight, OSA and third shift work schedule and current inability to exercise. 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 [facility])     Discussed diet and exercise   Wheezing     Encourage patient to use the albuterol inhaler if he is having shortness of breath or wheezing   Relevant Medications   albuterol (VENTOLIN HFA) 108 (90 Base) MCG/ACT inhaler   Screening for cholesterol level       Relevant Orders   Lipid panel   Encounter for hepatitis C screening test for low risk patient       Relevant Orders   Hepatitis C antibody   Encounter for screening colonoscopy     Healthcare maintenance   Relevant Orders   Ambulatory referral to Gastroenterology   Elbow mass, left     Evaluation   Relevant Orders   DG ELBOW COMPLETE LEFT (3+VIEW)      Meds ordered this encounter  Medications   nicotine (NICODERM CQ) 21 mg/24hr patch    Sig: Place 1 patch (21 mg total) onto the skin daily.    Dispense:  28 patch    Refill:  0    Order Specific Question:   Supervising Provider    Answer:   Tresa Garter [[id number]]   albuterol (VENTOLIN HFA) 108 (90 Base) MCG/ACT inhaler    Sig: Inhale 2 puffs into the lungs every 4 (four) hours as needed for wheezing or shortness of breath (cough, shortness of breath or wheezing.).    Dispense:   1 each    Refill:  3    Order Specific Question:   Supervising Provider    Answer:   Tresa Garter [[id number]]   amLODipine (NORVASC) 5 MG tablet    Sig: Take 1 tablet (5 mg total) by mouth daily.    Dispense:  90 tablet    Refill:  3    Order Specific Question:   Supervising Provider    Answer:   Tresa Garter [id number]    Follow-up: Return in about 2 months (around 11/22/2019).    Vevelyn Francois, NP

## 2019-09-23 ENCOUNTER — Other Ambulatory Visit: Payer: Self-pay | Admitting: Nurse Practitioner

## 2019-09-23 ENCOUNTER — Telehealth: Payer: Self-pay

## 2019-09-23 DIAGNOSIS — R2232 Localized swelling, mass and lump, left upper limb: Secondary | ICD-10-CM

## 2019-09-23 LAB — LIPID PANEL
Chol/HDL Ratio: 6.2 ratio — ABNORMAL HIGH (ref 0.0–5.0)
Cholesterol, Total: 242 mg/dL — ABNORMAL HIGH (ref 100–199)
HDL: 39 mg/dL — ABNORMAL LOW (ref >39)
LDL Chol Calc (NIH): 177 mg/dL — ABNORMAL HIGH (ref 0–99)
Triglycerides: 139 mg/dL (ref 0–149)
VLDL Cholesterol Cal: 26 mg/dL (ref 5–40)

## 2019-09-23 LAB — HEPATITIS C ANTIBODY: Hep C Virus Ab: 0.2 s/co ratio (ref 0.0–0.9)

## 2019-09-23 NOTE — Telephone Encounter (Signed)
Called Sleep disorder clinic to schedule appointment at that time system was down spoke with receptionist gave patient information. I was told by receptionist "Coralyn Mark" that due to insurance will have to be preauthorized and if patient calls back to inform patient of that information Sleep disorder clinic will call and schedule appointment afterward.

## 2019-09-23 NOTE — Progress Notes (Signed)
   [address], [place]  [address] Phone:  [phone]   Fax:  [phone]   X-ray of left elbow mass indicates: Large masslike soft tissue density containing internal fat attenuation in the proximal forearm, suspicious for a fat-containing neoplasm such as a lipoma or liposarcoma. MRI is recommended for further evaluation.

## 2019-09-29 ENCOUNTER — Encounter: Payer: Self-pay | Admitting: Internal Medicine

## 2019-10-07 ENCOUNTER — Telehealth: Payer: Self-pay | Admitting: *Deleted

## 2019-10-07 NOTE — Telephone Encounter (Signed)
OK for direct at [facility] or to make scheduling much easier the patient can lose weight down to 308 lbs or less to achieve BMI under 50 for [facility].

## 2019-10-07 NOTE — Telephone Encounter (Signed)
Left message for pt to call me in PV today before 5 pm

## 2019-10-07 NOTE — Telephone Encounter (Signed)
Dr Fuller Plan,  This pt is scheduled for a PV tomorrow, Friday 9-10- his BMI has been 50.22 to 51 since June- he has no GI hx- he is scheduled to see Dr Carlean Purl 10-11 for a Colon~  Sending this to you as DOD-    OV vs Direct at [facility]  Please advise- thanks, Lelan Pons

## 2019-10-08 NOTE — Telephone Encounter (Signed)
Called patient, no answer, left a message for him to call me back today before 5 pm.

## 2019-10-11 NOTE — Telephone Encounter (Signed)
Called patient, no answer today.
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
## 2019-10-21 ENCOUNTER — Encounter (HOSPITAL_BASED_OUTPATIENT_CLINIC_OR_DEPARTMENT_OTHER): Payer: 59 | Admitting: Internal Medicine

## 2019-10-22 ENCOUNTER — Ambulatory Visit (HOSPITAL_COMMUNITY): Payer: 59

## 2019-10-27 ENCOUNTER — Other Ambulatory Visit: Payer: Self-pay

## 2019-10-27 ENCOUNTER — Ambulatory Visit: Payer: 59 | Admitting: *Deleted

## 2019-10-27 VITALS — Ht 66.5 in | Wt 305.0 lb

## 2019-10-27 DIAGNOSIS — Z1211 Encounter for screening for malignant neoplasm of colon: Secondary | ICD-10-CM

## 2019-10-27 MED ORDER — SUPREP BOWEL PREP KIT 17.5-3.13-1.6 GM/177ML PO SOLN: 1.0000 | Freq: Once | ORAL | 0 refills | Status: AC

## 2019-10-27 NOTE — Progress Notes (Signed)
Pt completed covid vaccines 10-15-19  Pt is aware that care partner will wait in the car during procedure; if they feel like they will be too hot or cold to wait in the car; they may wait in the 4 th floor lobby. Patient is aware to bring only one care partner. We want them to wear a mask (we do not have any that we can provide them), practice social distancing, and we will check their temperatures when they get here.  I did remind the patient that their care partner needs to stay in the parking lot the entire time and have a cell phone available, we will call them when the pt is ready for discharge. Patient will wear mask into building.  Pt's previsit is done over the phone and all paperwork (prep instructions, blank consent form to just read over, pre-procedure acknowledgement form and stamped envelope) sent to patient  Pt states his current weight is 305 lbs   No egg or soy allergy  No home oxygen use   No medications for weight loss taken  emmi information given  Pt denies constipation issues  No trouble moving neck or fam hx/hx of malignant hyperthermia

## 2019-10-29 ENCOUNTER — Other Ambulatory Visit: Payer: Self-pay

## 2019-10-29 ENCOUNTER — Ambulatory Visit (HOSPITAL_COMMUNITY)
Admission: RE | Admit: 2019-10-29 | Discharge: 2019-10-29 | Disposition: A | Discharge status: Home / Self Care | Payer: 59 | Source: Ambulatory Visit | Attending: Nurse Practitioner | Admitting: Nurse Practitioner

## 2019-10-29 DIAGNOSIS — R2232 Localized swelling, mass and lump, left upper limb: Secondary | ICD-10-CM | POA: Diagnosis present

## 2019-10-29 IMAGING — MR MR ELBOW*L* WO/W CM
6 of 9 series · 27 of 40 positions shown · IV contrast (GADAVIST)
Comparison: None.

CLINICAL DATA: Soft tissue mass of the elbow presents for
characterization

EXAM:
MRI OF THE LEFT ELBOW WITHOUT AND WITH CONTRAST
TECHNIQUE: Multiplanar, multisequence MR imaging of the elbow was performed
before and after the administration of intravenous contrast.
CONTRAST:  10mL GADAVIST GADOBUTROL 1 MMOL/ML IV SOLN

[Series 3: T1 · axial · left · 4.0mm · 0.29mm/px · z∈[-131,+63]mm · 6 of 38 slices shown (1 of 2)]
[im 1/38]
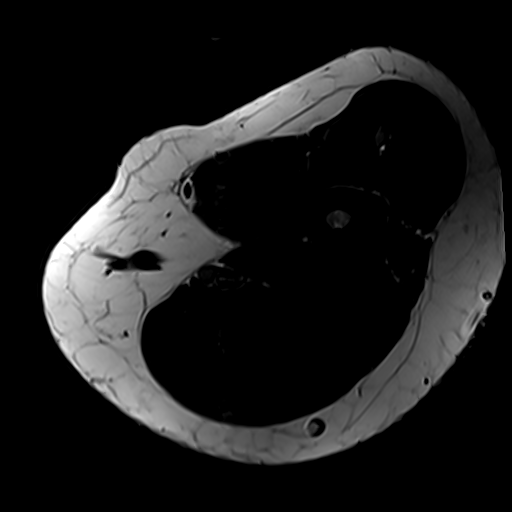
[im 8/38]
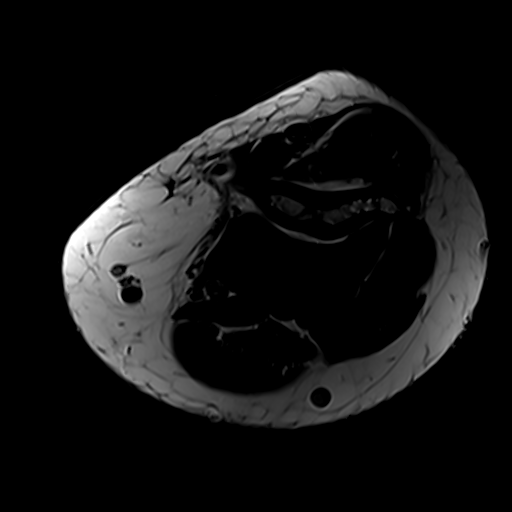
[im 15/38]
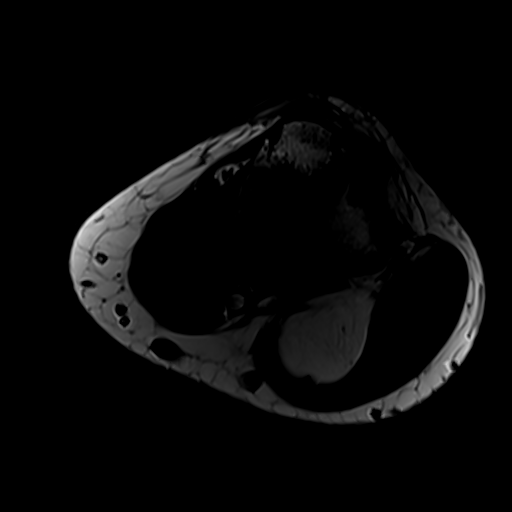
[im 23/38]
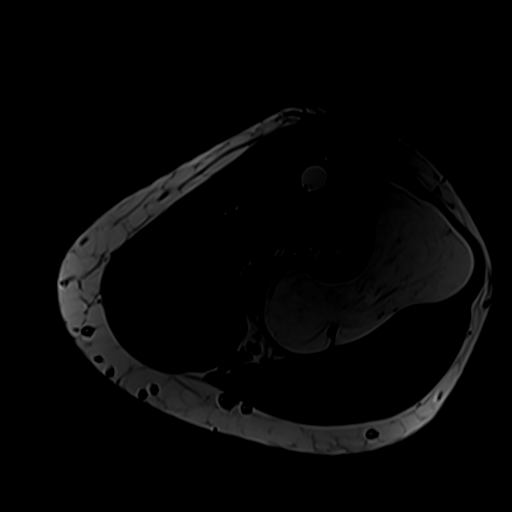
[im 30/38]
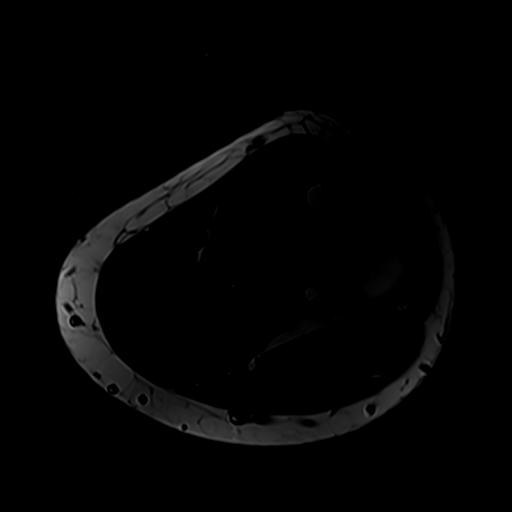
[im 38/38]
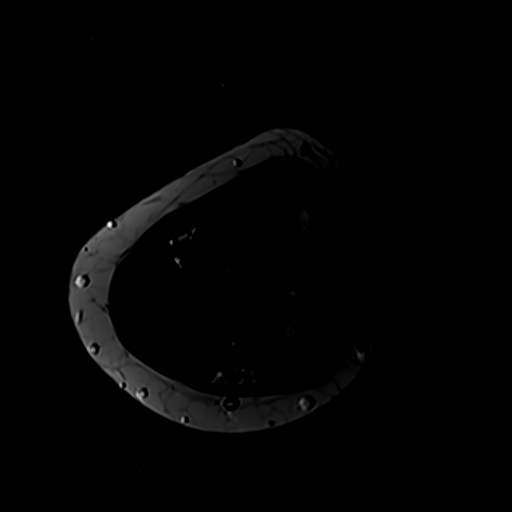

[Series 4: T2 fat-sat · axial · left · 4.0mm · 0.29mm/px · z∈[-131,+63]mm · 5 of 38 slices shown (1 of 2)]
[im 1/38]
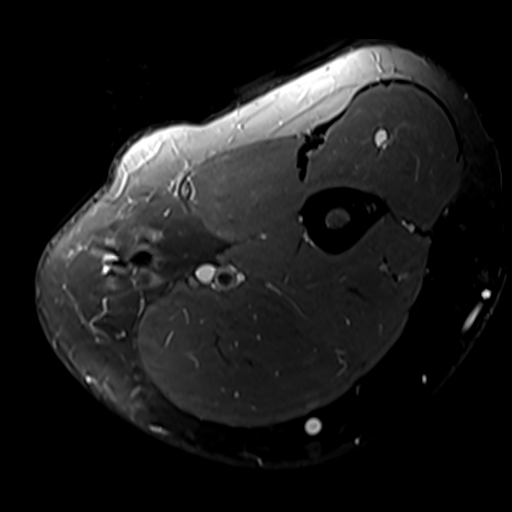
[im 10/38]
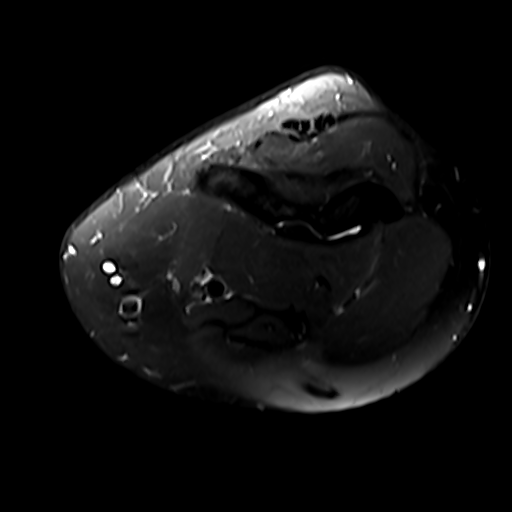
[im 19/38]
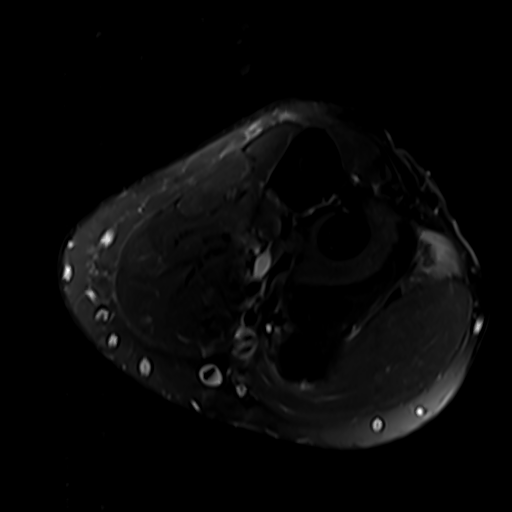
[im 28/38]
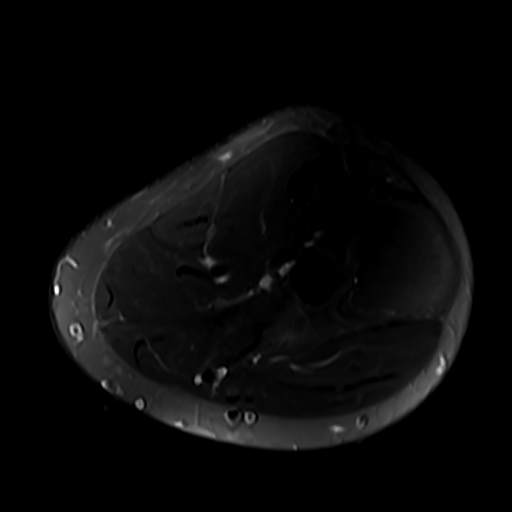
[im 38/38]
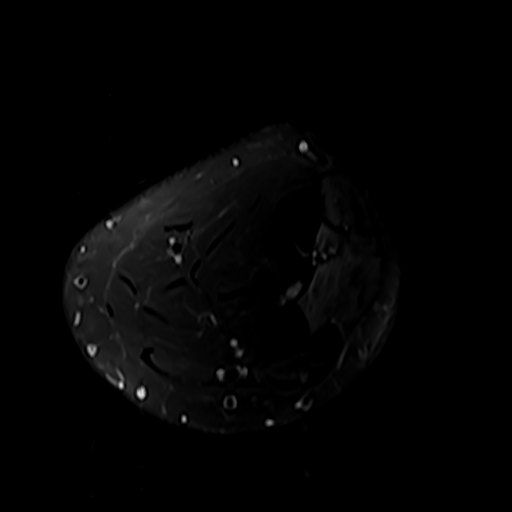

[Series 5: T2 fat-sat · oblique · left · 4.0mm · 0.39mm/px · 3 of 25 slices shown (2 of 2)]
[im 1/25]
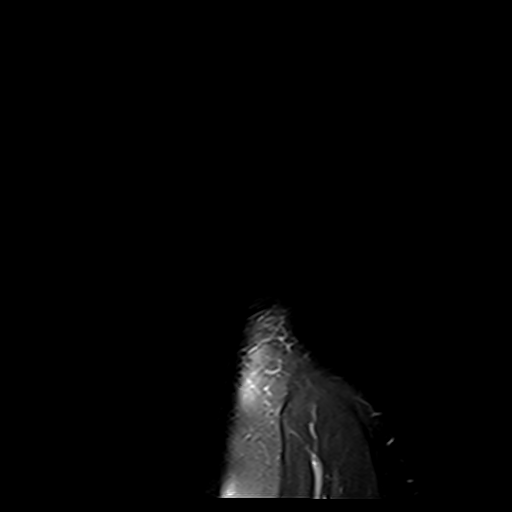
[im 13/25]
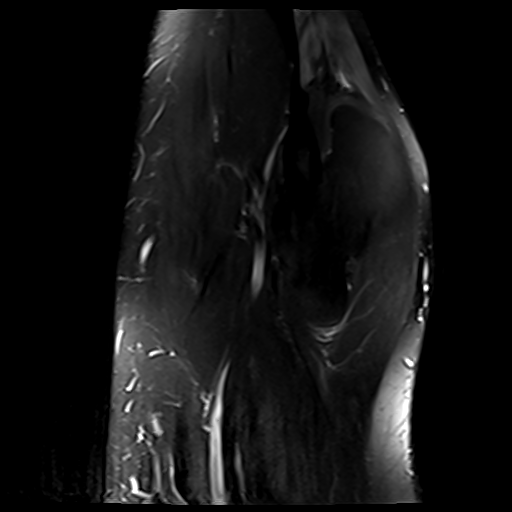
[im 25/25]
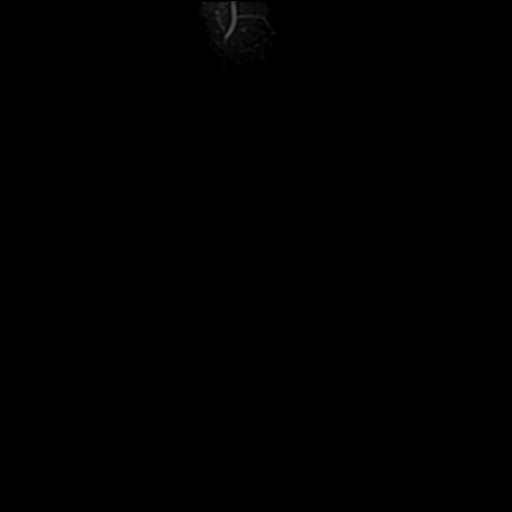

[Series 6: T1 · oblique · left · 4.0mm · 0.78mm/px · 3 of 25 slices shown (2 of 2)]
[im 1/25]
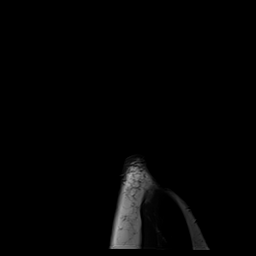
[im 13/25]
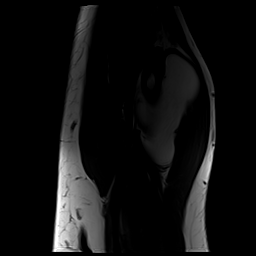
[im 25/25]
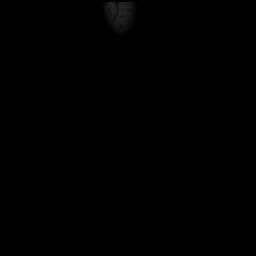

[Series 7: PD fat-sat · oblique · left · 3.5mm · 0.66mm/px · 4 of 33 slices shown]
[im 1/33]
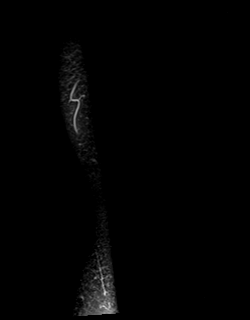
[im 11/33]
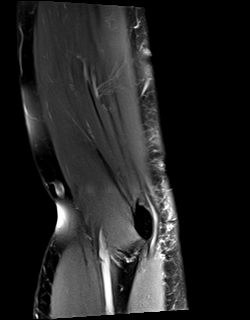
[im 22/33]
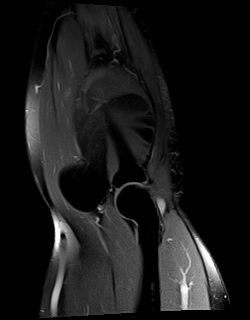
[im 33/33]
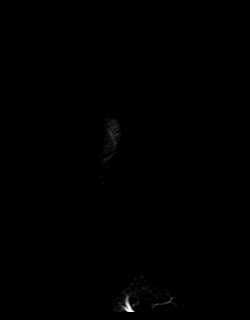

[Series 8: T1 fat-sat · axial · non-contrast · left · 4.0mm · 0.39mm/px · z∈[-138,+78]mm · 6 of 42 slices shown]
[im 1/42]
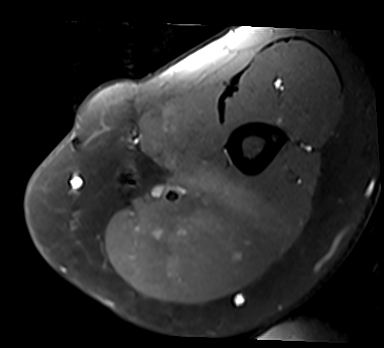
[im 9/42]
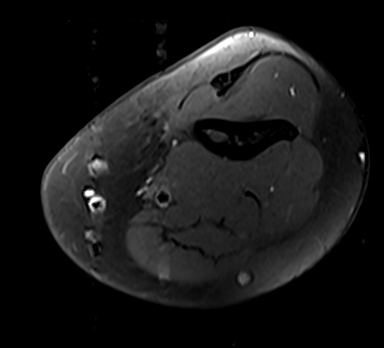
[im 17/42]
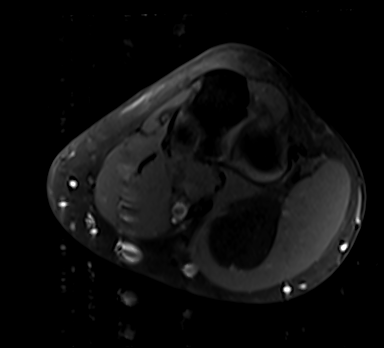
[im 25/42]
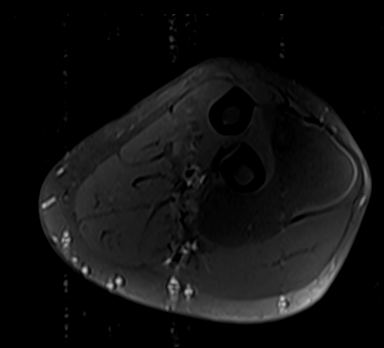
[im 33/42]
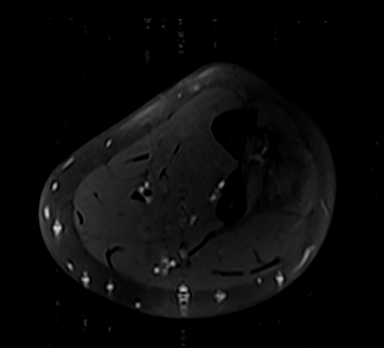
[im 42/42]
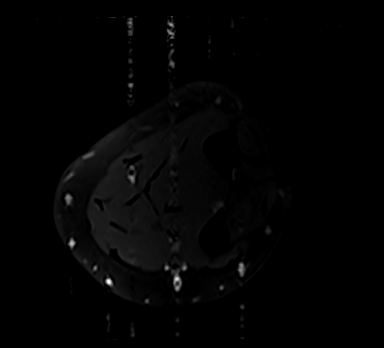

[27 of 40 positions shown; findings below may reference images not displayed]

FINDINGS: TENDONS

Common forearm flexor origin: Intact

Common forearm extensor origin: Mild tendinosis of the common
extensor tendon origin.

Biceps: Intact

Triceps: Intact

LIGAMENTS

Medial stabilizers: Intact

Lateral stabilizers:  Intact

Cartilage: No chondral defect.

Joint: No joint effusion. No synovitis.

Cubital tunnel: Normal.

Bones: No fracture or dislocation. No marrow abnormality.

Soft Tissues: 5.9 x 2.3 x 9.6 cm T1 hyperintense soft tissue mass
with complete signal dropout on fat saturated images without
significant enhancing septations or mural nodules. Mass is located
between the supinator muscle and brachioradialis muscle.
IMPRESSION: 1. 5.9 x 2.3 x 9.6 cm simple lipoma between the supinator muscle and
brachioradialis muscle.
2. Mild tendinosis of the common extensor tendon origin.

## 2019-10-29 MED ORDER — GADOBUTROL 1 MMOL/ML IV SOLN
10.0000 mL | Freq: Once | INTRAVENOUS | Status: AC | PRN
  Administered 2019-10-29: 10 mL via INTRAVENOUS

## 2019-11-08 ENCOUNTER — Encounter: Payer: 59 | Admitting: Internal Medicine

## 2019-11-12 ENCOUNTER — Encounter: Payer: Self-pay | Admitting: Gastroenterology

## 2019-11-12 ENCOUNTER — Other Ambulatory Visit: Payer: Self-pay

## 2019-11-12 ENCOUNTER — Ambulatory Visit (AMBULATORY_SURGERY_CENTER): Payer: 59 | Admitting: Gastroenterology

## 2019-11-12 VITALS — BP 159/85 | HR 81 | Temp 97.6°F | Resp 12 | Ht 66.5 in | Wt 302.8 lb

## 2019-11-12 DIAGNOSIS — K5730 Diverticulosis of large intestine without perforation or abscess without bleeding: Secondary | ICD-10-CM

## 2019-11-12 DIAGNOSIS — D120 Benign neoplasm of cecum: Secondary | ICD-10-CM

## 2019-11-12 DIAGNOSIS — D128 Benign neoplasm of rectum: Secondary | ICD-10-CM

## 2019-11-12 DIAGNOSIS — K640 First degree hemorrhoids: Secondary | ICD-10-CM

## 2019-11-12 DIAGNOSIS — K621 Rectal polyp: Secondary | ICD-10-CM

## 2019-11-12 DIAGNOSIS — K5140 Inflammatory polyps of colon without complications: Secondary | ICD-10-CM

## 2019-11-12 DIAGNOSIS — Z1211 Encounter for screening for malignant neoplasm of colon: Secondary | ICD-10-CM | POA: Diagnosis not present

## 2019-11-12 MED ORDER — SODIUM CHLORIDE 0.9 % IV SOLN: 500.0000 mL | Freq: Once | INTRAVENOUS | Status: DC

## 2019-11-12 NOTE — Progress Notes (Signed)
Medical History reviewed, no changes noted. VS stable

## 2019-11-12 NOTE — Op Note (Signed)
[facility] Patient Name: Randy Morgan Procedure Date: 11/12/2019 1:21 PM MRN: [id number] Endoscopist: Gerrit Heck , MD Age: 54 Referring MD:  Date of Birth: February 24, 1965 Gender: Male Account #: [id number] Procedure:                Colonoscopy Indications:              Screening for colorectal malignant neoplasm, This                            is the patient's first colonoscopy Medicines:                Monitored Anesthesia Care Procedure:                Pre-Anesthesia Assessment:                           - Prior to the procedure, a History and Physical                            was performed, and patient medications and                            allergies were reviewed. The patient's tolerance of                            previous anesthesia was also reviewed. The risks                            and benefits of the procedure and the sedation                            options and risks were discussed with the patient.                            All questions were answered, and informed consent                            was obtained. Prior Anticoagulants: The patient has                            taken no previous anticoagulant or antiplatelet                            agents. ASA Grade Assessment: III - A patient with                            severe systemic disease. After reviewing the risks                            and benefits, the patient was deemed in                            satisfactory condition to undergo the procedure.  After obtaining informed consent, the colonoscope                            was passed under direct vision. Throughout the                            procedure, the patient's blood pressure, pulse, and                            oxygen saturations were monitored continuously. The                            Colonoscope was introduced through the anus and                            advanced to the the  terminal ileum. The colonoscopy                            was performed without difficulty. The patient                            tolerated the procedure well. The quality of the                            bowel preparation was good. The terminal ileum,                            ileocecal valve, appendiceal orifice, and rectum                            were photographed. Scope In: 1:41:41 PM Scope Out: 1:59:29 PM Scope Withdrawal Time: 0 hours 14 minutes 23 seconds  Total Procedure Duration: 0 hours 17 minutes 48 seconds  Findings:                 The perianal and digital rectal examinations were                            normal.                           A 4 mm polyp was found in the cecum. The polyp was                            sessile. The polyp was removed with a cold snare.                            Resection and retrieval were complete. Estimated                            blood loss was minimal.                           Two sessile polyps were found in the rectum. The  polyps were 2 to 3 mm in size. These polyps were                            removed with a cold snare. Resection and retrieval                            were complete. Estimated blood loss was minimal.                           Multiple small and large-mouthed diverticula were                            found in the sigmoid colon.                           Non-bleeding internal hemorrhoids were found during                            retroflexion. The hemorrhoids were small and Grade                            I (internal hemorrhoids that do not prolapse).                           The terminal ileum appeared normal. Complications:            No immediate complications. Estimated Blood Loss:     Estimated blood loss was minimal. Impression:               - One 4 mm polyp in the cecum, removed with a cold                            snare. Resected and retrieved.                            - Two 2 to 3 mm polyps in the rectum, removed with                            a cold snare. Resected and retrieved.                           - Diverticulosis in the sigmoid colon.                           - Non-bleeding internal hemorrhoids.                           - The examined portion of the ileum was normal. Recommendation:           - Patient has a contact number available for                            emergencies. The signs and symptoms of potential  delayed complications were discussed with the                            patient. Return to normal activities tomorrow.                            Written discharge instructions were provided to the                            patient.                           - Resume previous diet.                           - Continue present medications.                           - Await pathology results.                           - Repeat colonoscopy for surveillance based on                            pathology results.                           - Return to GI office PRN. Gerrit Heck, MD 11/12/2019 2:07:01 PM

## 2019-11-12 NOTE — Progress Notes (Signed)
Called to room to assist during endoscopic procedure.  Patient ID and intended procedure confirmed with present staff. Received instructions for my participation in the procedure from the performing physician.  

## 2019-11-12 NOTE — Progress Notes (Signed)
A/ox3, pleased with MAC, report to RN 

## 2019-11-12 NOTE — Patient Instructions (Signed)
  Handouts given:  Polyps, Hemorrhoids, Diverticulosis Continue current medications  await pathology results Resume previous diet  YOU HAD AN ENDOSCOPIC PROCEDURE TODAY AT [facility]:   Refer to the procedure report that was given to you for any specific questions about what was found during the examination.  If the procedure report does not answer your questions, please call your gastroenterologist to clarify.  If you requested that your care partner not be given the details of your procedure findings, then the procedure report has been included in a sealed envelope for you to review at your convenience later.  YOU SHOULD EXPECT: Some feelings of bloating in the abdomen. Passage of more gas than usual.  Walking can help get rid of the air that was put into your GI tract during the procedure and reduce the bloating. If you had a lower endoscopy (such as a colonoscopy or flexible sigmoidoscopy) you may notice spotting of blood in your stool or on the toilet paper. If you underwent a bowel prep for your procedure, you may not have a normal bowel movement for a few days.  Please Note:  You might notice some irritation and congestion in your nose or some drainage.  This is from the oxygen used during your procedure.  There is no need for concern and it should clear up in a day or so.  SYMPTOMS TO REPORT IMMEDIATELY:   Following lower endoscopy (colonoscopy or flexible sigmoidoscopy):  Excessive amounts of blood in the stool  Significant tenderness or worsening of abdominal pains  Swelling of the abdomen that is new, acute  Fever of 100F or higher  For urgent or emergent issues, a gastroenterologist can be reached at any hour by calling [phone]. Do not use MyChart messaging for urgent concerns.    DIET:  We do recommend a small meal at first, but then you may proceed to your regular diet.  Drink plenty of fluids but you should avoid alcoholic beverages for 24  hours.  ACTIVITY:  You should plan to take it easy for the rest of today and you should NOT DRIVE or use heavy machinery until tomorrow (because of the sedation medicines used during the test).    FOLLOW UP: Our staff will call the number listed on your records 48-72 hours following your procedure to check on you and address any questions or concerns that you may have regarding the information given to you following your procedure. If we do not reach you, we will leave a message.  We will attempt to reach you two times.  During this call, we will ask if you have developed any symptoms of COVID 19. If you develop any symptoms (ie: fever, flu-like symptoms, shortness of breath, cough etc.) before then, please call [phone].  If you test positive for Covid 19 in the 2 weeks post procedure, please call and report this information to [place].    If any biopsies were taken you will be contacted by phone or by letter within the next 1-3 weeks.  Please call us at [phone] if you have not heard about the biopsies in 3 weeks.    SIGNATURES/CONFIDENTIALITY: You and/or your care partner have signed paperwork which will be entered into your electronic medical record.  These signatures attest to the fact that that the information above on your After Visit Summary has been reviewed and is understood.  Full responsibility of the confidentiality of this discharge information lies with you and/or your care-partner.

## 2019-11-16 ENCOUNTER — Telehealth: Payer: Self-pay | Admitting: *Deleted

## 2019-11-16 ENCOUNTER — Telehealth: Payer: Self-pay

## 2019-11-16 NOTE — Telephone Encounter (Signed)
Attempted f/u phone call. No answer. Left message. °

## 2019-11-16 NOTE — Telephone Encounter (Signed)
  Follow up Call-  Call back number 11/12/2019  Post procedure Call Back phone  # [phone]  Permission to leave phone message Yes  Some recent data might be hidden     Patient questions:  Do you have a fever, pain , or abdominal swelling? No. Pain Score  0 *  Have you tolerated food without any problems? Yes.    Have you been able to return to your normal activities? Yes.    Do you have any questions about your discharge instructions: Diet   No. Medications  No. Follow up visit  No.  Do you have questions or concerns about your Care? No.  Actions: * If pain score is 4 or above: No action needed, pain <4.  1. Have you developed a fever since your procedure? no  2.   Have you had an respiratory symptoms (SOB or cough) since your procedure? no  3.   Have you tested positive for COVID 19 since your procedure no  4.   Have you had any family members/close contacts diagnosed with the COVID 19 since your procedure?  no   If yes to any of these questions please route to Joylene John, RN and Joella Prince, RN

## 2019-11-19 ENCOUNTER — Other Ambulatory Visit: Payer: Self-pay

## 2019-11-19 ENCOUNTER — Ambulatory Visit (HOSPITAL_BASED_OUTPATIENT_CLINIC_OR_DEPARTMENT_OTHER): Payer: 59 | Attending: Nurse Practitioner | Admitting: Internal Medicine

## 2019-11-19 DIAGNOSIS — R0902 Hypoxemia: Secondary | ICD-10-CM | POA: Diagnosis not present

## 2019-11-19 DIAGNOSIS — G4733 Obstructive sleep apnea (adult) (pediatric): Secondary | ICD-10-CM | POA: Diagnosis present

## 2019-11-23 ENCOUNTER — Encounter (HOSPITAL_BASED_OUTPATIENT_CLINIC_OR_DEPARTMENT_OTHER): Payer: 59 | Admitting: Internal Medicine

## 2019-11-24 ENCOUNTER — Encounter: Payer: Self-pay | Admitting: Gastroenterology

## 2019-11-24 ENCOUNTER — Ambulatory Visit: Payer: Self-pay | Admitting: Nurse Practitioner

## 2019-12-04 DIAGNOSIS — G4733 Obstructive sleep apnea (adult) (pediatric): Secondary | ICD-10-CM

## 2019-12-04 NOTE — Procedures (Signed)
Patient Name: Randy Morgan, Randy Morgan Date: 11/19/2019 Gender: Male D.O.B: 1965-11-04 Age (years): 35 Referring Provider: Dionisio David NP Height (inches): 67 Interpreting Physician: Baird Lyons MD, ABSM Weight (lbs): 310 RPSGT: Baxter Flattery BMI: 69 MRN: [id number] Neck Size: 19.00  CLINICAL INFORMATION The patient is referred for a CPAP titration to treat sleep apnea.  Date of NPSG, Split Night or HST: NPSG 08/27/19  AHI 80.6/ hr, desaturation to 68%, body weight 310 lbs  SLEEP STUDY TECHNIQUE As per the AASM Manual for the Scoring of Sleep and Associated Events v2.3 (April 2016) with a hypopnea requiring 4% desaturations.  The channels recorded and monitored were frontal, central and occipital EEG, electrooculogram (EOG), submentalis EMG (chin), nasal and oral airflow, thoracic and abdominal wall motion, anterior tibialis EMG, snore microphone, electrocardiogram, and pulse oximetry. Continuous positive airway pressure (CPAP) was initiated at the beginning of the study and titrated to treat sleep-disordered breathing.  MEDICATIONS Medications self-administered by patient taken the night of the study : none reported  TECHNICIAN COMMENTS Comments added by technician: [place] Comments added by scorer: N/A RESPIRATORY PARAMETERS Optimal PAP Pressure (cm): 15 AHI at Optimal Pressure (/hr): 4.9 Overall Minimal O2 (%): 72.0 Supine % at Optimal Pressure (%): 0 Minimal O2 at Optimal Pressure (%): 87.0   SLEEP ARCHITECTURE The study was initiated at 10:58:35 PM and ended at 5:04:39 AM.  Sleep onset time was 4.2 minutes and the sleep efficiency was 84.5%%. The total sleep time was 309.4 minutes.  The patient spent 3.7%% of the night in stage N1 sleep, 62.2%% in stage N2 sleep, 0.0%% in stage N3 and 34.1% in REM.Stage REM latency was 25.5 minutes  Wake after sleep onset was 52.5. Alpha intrusion was absent. Supine sleep was 41.69%.  CARDIAC DATA The 2 lead  EKG demonstrated sinus rhythm. The mean heart rate was 90.6 beats per minute. Other EKG findings include: Ventricular Tachycardia, PVCs.  LEG MOVEMENT DATA The total Periodic Limb Movements of Sleep (PLMS) were 0. The PLMS index was 0.0. A PLMS index of <15 is considered normal in adults.  IMPRESSIONS - The optimal PAP pressure was 15 cm of water. - Central sleep apnea was not noted during this titration (CAI = 0.8/h). - Oxygen desaturations were observed during this titration. On CPAP 15, minimum saturation was 87% and mean sat was 89.9%. 75 minutes recorded with O2 sat 88% or less during titration on CPAP.  - No snoring was audible during this study. - 2-lead EKG demonstrated: Ventricular Tachycardia, PVCs - Clinically significant periodic limb movements were not noted during this study. Arousals associated with PLMs were rare.  DIAGNOSIS - Obstructive Sleep Apnea (G47.33) - Nocturnal Hypoxemia  RECOMMENDATIONS - Trial of CPAP therapy on 15 cm H2O or autopap 10-20. - Patient used a Medium size Fisher[employer]Paykel Full Face Mask Simplus mask and heated humidification. - Recommend supplemental O2 2L. - Be careful with alcohol, sedatives and other CNS depressants that may worsen sleep apnea and disrupt normal sleep architecture. - Sleep hygiene should be reviewed to assess factors that may improve sleep quality. - Weight management and regular exercise should be initiated or continued.  [Electronically signed] 12/04/2019 03:40 PM  Baird Lyons MD, [place], American Board of Sleep Medicine   NPI: [id number]                         [place], [place] [facility]  ELECTRONICALLY SIGNED ON:  12/04/2019,  3:33 PM [facility] PH: [phone]   FX: [phone] [place]

## 2019-12-06 ENCOUNTER — Other Ambulatory Visit: Payer: Self-pay | Admitting: Nurse Practitioner

## 2019-12-06 DIAGNOSIS — G4733 Obstructive sleep apnea (adult) (pediatric): Secondary | ICD-10-CM

## 2019-12-06 NOTE — Progress Notes (Signed)
   [place], [place]  [address] Phone:  [phone]   Fax:  [phone]  -Trial of CPAP therapy on 15 cm H2O or autopap 10-20. - Patient used a Medium size Fisher&Paykel Full Face Mask Simplus mask and heated humidification. - Recommend supplemental O2 2L.

## 2019-12-10 ENCOUNTER — Telehealth: Payer: Self-pay | Admitting: Nurse Practitioner

## 2019-12-13 NOTE — Telephone Encounter (Signed)
Sent to provider 

## 2019-12-13 NOTE — Telephone Encounter (Signed)
Order, notes, demo sent to [facility]

## 2019-12-13 NOTE — Telephone Encounter (Signed)
[facility] [phone] Fax [phone]  This is for respiratory supplies  Thanks

## 2019-12-17 ENCOUNTER — Telehealth: Payer: Self-pay | Admitting: Nurse Practitioner

## 2019-12-17 NOTE — Telephone Encounter (Signed)
Pt was called concerning awv w/ pcc. lvm to return call

## 2020-02-28 ENCOUNTER — Telehealth: Payer: Self-pay | Admitting: Nurse Practitioner

## 2020-02-28 NOTE — Telephone Encounter (Signed)
He needs an apt  He no showed for his last visit and I am full this afternoon.

## 2020-03-01 ENCOUNTER — Ambulatory Visit: Payer: Self-pay | Admitting: Nurse Practitioner

## 2020-03-02 ENCOUNTER — Other Ambulatory Visit: Payer: Self-pay

## 2020-03-02 ENCOUNTER — Ambulatory Visit (INDEPENDENT_AMBULATORY_CARE_PROVIDER_SITE_OTHER): Payer: 59 | Admitting: Nurse Practitioner

## 2020-03-02 ENCOUNTER — Encounter: Payer: Self-pay | Admitting: Nurse Practitioner

## 2020-03-02 VITALS — BP 137/75 | HR 95 | Temp 97.3°F | Ht 66.5 in | Wt 318.0 lb

## 2020-03-02 DIAGNOSIS — I10 Essential (primary) hypertension: Secondary | ICD-10-CM | POA: Diagnosis not present

## 2020-03-02 DIAGNOSIS — E661 Drug-induced obesity: Secondary | ICD-10-CM

## 2020-03-02 DIAGNOSIS — I161 Hypertensive emergency: Secondary | ICD-10-CM | POA: Diagnosis not present

## 2020-03-02 DIAGNOSIS — R7303 Prediabetes: Secondary | ICD-10-CM

## 2020-03-02 DIAGNOSIS — K047 Periapical abscess without sinus: Secondary | ICD-10-CM

## 2020-03-02 DIAGNOSIS — Z13220 Encounter for screening for lipoid disorders: Secondary | ICD-10-CM

## 2020-03-02 DIAGNOSIS — Z9114 Patient's other noncompliance with medication regimen: Secondary | ICD-10-CM

## 2020-03-02 DIAGNOSIS — Z6841 Body Mass Index (BMI) 40.0 and over, adult: Secondary | ICD-10-CM

## 2020-03-02 DIAGNOSIS — J9600 Acute respiratory failure, unspecified whether with hypoxia or hypercapnia: Secondary | ICD-10-CM

## 2020-03-02 DIAGNOSIS — K5140 Inflammatory polyps of colon without complications: Secondary | ICD-10-CM

## 2020-03-02 DIAGNOSIS — I5031 Acute diastolic (congestive) heart failure: Secondary | ICD-10-CM

## 2020-03-02 MED ORDER — HYDROCHLOROTHIAZIDE 25 MG PO TABS: 25.0000 mg | ORAL_TABLET | Freq: Every day | ORAL | 3 refills | Status: DC

## 2020-03-02 MED ORDER — AMLODIPINE-OLMESARTAN 10-40 MG PO TABS: 1.0000 | ORAL_TABLET | Freq: Every day | ORAL | 11 refills | Status: DC

## 2020-03-02 MED ORDER — CLONIDINE HCL 0.1 MG PO TABS: 0.3000 mg | ORAL_TABLET | Freq: Once | ORAL | Status: DC

## 2020-03-02 MED ORDER — AMOXICILLIN-POT CLAVULANATE 875-125 MG PO TABS: 1.0000 | ORAL_TABLET | Freq: Two times a day (BID) | ORAL | 0 refills | Status: AC

## 2020-03-02 MED ORDER — CLONIDINE HCL 0.1 MG PO TABS: 0.2000 mg | ORAL_TABLET | Freq: Once | ORAL | Status: DC

## 2020-03-02 NOTE — Patient Instructions (Signed)
Managing Your Hypertension Hypertension, also called high blood pressure, is when the force of the blood pressing against the walls of the arteries is too strong. Arteries are blood vessels that carry blood from your heart throughout your body. Hypertension forces the heart to work harder to pump blood and may cause the arteries to become narrow or stiff. Understanding blood pressure readings Your personal target blood pressure may vary depending on your medical conditions, your age, and other factors. A blood pressure reading includes a higher number over a lower number. Ideally, your blood pressure should be below 120/80. You should know that:  The first, or top, number is called the systolic pressure. It is a measure of the pressure in your arteries as your heart beats.  The second, or bottom number, is called the diastolic pressure. It is a measure of the pressure in your arteries as the heart relaxes. Blood pressure is classified into four stages. Based on your blood pressure reading, your health care provider may use the following stages to determine what type of treatment you need, if any. Systolic pressure and diastolic pressure are measured in a unit called mmHg. Normal  Systolic pressure: below 120.  Diastolic pressure: below 80. Elevated  Systolic pressure: 120-129.  Diastolic pressure: below 80. Hypertension stage 1  Systolic pressure: 130-139.  Diastolic pressure: 80-89. Hypertension stage 2  Systolic pressure: 140 or above.  Diastolic pressure: 90 or above. How can this condition affect me? Managing your hypertension is an important responsibility. Over time, hypertension can damage the arteries and decrease blood flow to important parts of the body, including the brain, heart, and kidneys. Having untreated or uncontrolled hypertension can lead to:  A heart attack.  A stroke.  A weakened blood vessel (aneurysm).  Heart failure.  Kidney damage.  Eye  damage.  Metabolic syndrome.  Memory and concentration problems.  Vascular dementia. What actions can I take to manage this condition? Hypertension can be managed by making lifestyle changes and possibly by taking medicines. Your health care provider will help you make a plan to bring your blood pressure within a normal range. Nutrition  Eat a diet that is high in fiber and potassium, and low in salt (sodium), added sugar, and fat. An example eating plan is called the Dietary Approaches to Stop Hypertension (DASH) diet. To eat this way: ? Eat plenty of fresh fruits and vegetables. Try to fill one-half of your plate at each meal with fruits and vegetables. ? Eat whole grains, such as whole-wheat pasta, brown rice, or whole-grain bread. Fill about one-fourth of your plate with whole grains. ? Eat low-fat dairy products. ? Avoid fatty cuts of meat, processed or cured meats, and poultry with skin. Fill about one-fourth of your plate with lean proteins such as fish, chicken without skin, beans, eggs, and tofu. ? Avoid pre-made and processed foods. These tend to be higher in sodium, added sugar, and fat.  Reduce your daily sodium intake. Most people with hypertension should eat less than 1,500 mg of sodium a day.   Lifestyle  Work with your health care provider to maintain a healthy body weight or to lose weight. Ask what an ideal weight is for you.  Get at least 30 minutes of exercise that causes your heart to beat faster (aerobic exercise) most days of the week. Activities may include walking, swimming, or biking.  Include exercise to strengthen your muscles (resistance exercise), such as weight lifting, as part of your weekly exercise routine. Try   to do these types of exercises for 30 minutes at least 3 days a week.  Do not use any products that contain nicotine or tobacco, such as cigarettes, e-cigarettes, and chewing tobacco. If you need help quitting, ask your health care  provider.  Control any long-term (chronic) conditions you have, such as high cholesterol or diabetes.  Identify your sources of stress and find ways to manage stress. This may include meditation, deep breathing, or making time for fun activities.   Alcohol use  Do not drink alcohol if: ? Your health care provider tells you not to drink. ? You are pregnant, may be pregnant, or are planning to become pregnant.  If you drink alcohol: ? Limit how much you use to:  0-1 drink a day for women.  0-2 drinks a day for men. ? Be aware of how much alcohol is in your drink. In the [place] one drink equals one 12 oz bottle of beer (355 mL), one 5 oz glass of wine (148 mL), or one 1 oz glass of hard liquor (44 mL). Medicines Your health care provider may prescribe medicine if lifestyle changes are not enough to get your blood pressure under control and if:  Your systolic blood pressure is 130 or higher.  Your diastolic blood pressure is 80 or higher. Take medicines only as told by your health care provider. Follow the directions carefully. Blood pressure medicines must be taken as told by your health care provider. The medicine does not work as well when you skip doses. Skipping doses also puts you at risk for problems. Monitoring Before you monitor your blood pressure:  Do not smoke, drink caffeinated beverages, or exercise within 30 minutes before taking a measurement.  Use the bathroom and empty your bladder (urinate).  Sit quietly for at least 5 minutes before taking measurements. Monitor your blood pressure at home as told by your health care provider. To do this:  Sit with your back straight and supported.  Place your feet flat on the floor. Do not cross your legs.  Support your arm on a flat surface, such as a table. Make sure your upper arm is at heart level.  Each time you measure, take two or three readings one minute apart and record the results. You may also need to have your  blood pressure checked regularly by your health care provider.   General information  Talk with your health care provider about your diet, exercise habits, and other lifestyle factors that may be contributing to hypertension.  Review all the medicines you take with your health care provider because there may be side effects or interactions.  Keep all visits as told by your health care provider. Your health care provider can help you create and adjust your plan for managing your high blood pressure. Where to find more information  National Heart, Lung, and Blood Institute: www.nhlbi.nih.gov  American Heart Association: www.heart.org Contact a health care provider if:  You think you are having a reaction to medicines you have taken.  You have repeated (recurrent) headaches.  You feel dizzy.  You have swelling in your ankles.  You have trouble with your vision. Get help right away if:  You develop a severe headache or confusion.  You have unusual weakness or numbness, or you feel faint.  You have severe pain in your chest or abdomen.  You vomit repeatedly.  You have trouble breathing. These symptoms may represent a serious problem that is an emergency. Do not wait   to see if the symptoms will go away. Get medical help right away. Call your local emergency services (911 in the [place] Do not drive yourself to the hospital. Summary  Hypertension is when the force of blood pumping through your arteries is too strong. If this condition is not controlled, it may put you at risk for serious complications.  Your personal target blood pressure may vary depending on your medical conditions, your age, and other factors. For most people, a normal blood pressure is less than 120/80.  Hypertension is managed by lifestyle changes, medicines, or both.  Lifestyle changes to help manage hypertension include losing weight, eating a healthy, low-sodium diet, exercising more, stopping smoking, and  limiting alcohol. This information is not intended to replace advice given to you by your health care provider. Make sure you discuss any questions you have with your health care provider. Document Revised: 02/19/2019 Document Reviewed: 12/15/2018 Elsevier Patient Education  2021 Elsevier Inc.  

## 2020-03-02 NOTE — Progress Notes (Unsigned)
[facility] [place], [place]  [address] Phone:  [phone]   Fax:  [phone]   Established Patient Office Visit  Subjective:  Patient ID: Randy Morgan, male    DOB: 1965-12-05  Age: 55 y.o. MRN: [id number]  CC:  Chief Complaint  Patient presents with  . Follow-up    Tooth ache , going for 2 day , no fever hurting in ear left no drainage out  ear , some runny nose, pain behind left eye     HPI Randy Morgan presents for follow up. He  has a past medical history of Asthma, Hypertension, and Sleep apnea.   Upper Respiratory Infection Patient complains of symptoms of a tooth abcess. Symptoms include left ear pressure/pain, facial pain, nasal congestion and sinus pressure. Onset of symptoms was 2 days ago, and has been gradually worsening since that time. Treatment to date: antihistamines, cough suppressants and decongestants.  Hypertension Patient is here for follow-up of elevated blood pressure. He is not exercising and is adherent to a low-salt diet. He is not compliant with his medication. He feels like it makes him feel worse. Blood pressure is not well controlled at home. He has an abscess. Use of agents associated with hypertension: decongestants.He has used a lot of over the counter medications the last few days.  History of target organ damage: chronic kidney disease. He has been in increased stress due to some traumatic incidents that have been ongoing. .     Past Medical History:  Diagnosis Date  . Asthma   . Hypertension   . Sleep apnea    hasn't started CPAP yet    Past Surgical History:  Procedure Laterality Date  . NO PAST SURGERIES    . VIDEO BRONCHOSCOPY Bilateral 12/09/2017   Procedure: VIDEO BRONCHOSCOPY WITHOUT FLUORO;  Surgeon: Laurin Coder, MD;  Location: [facility];  Service: Cardiopulmonary;  Laterality: Bilateral;    Family History  Problem Relation Age of Onset  . Heart disease Mother   . Diabetes  Mother   . Asthma Mother   . Heart disease Father   . Asthma Sister   . Heart disease Brother   . Heart attack Brother   . Colon cancer Neg Hx   . Esophageal cancer Neg Hx   . Rectal cancer Neg Hx   . Stomach cancer Neg Hx     Social History   Socioeconomic History  . Marital status: Married    Spouse name: Not on file  . Number of children: 2  . Years of education: 12th grade + 2 years college  . Highest education level: Not on file  Occupational History  . Occupation: makes concrete blocks and bricks  Tobacco Use  . Smoking status: Current Some Day Smoker    Packs/day: 0.50    Years: 25.00    Pack years: 12.50    Types: Cigarettes  . Smokeless tobacco: Never Used  Vaping Use  . Vaping Use: Never used  Substance and Sexual Activity  . Alcohol use: Not Currently    Alcohol/week: 2.0 standard drinks    Types: 2 Cans of beer per week    Comment: occ beer  . Drug use: No  . Sexual activity: Not Currently  Other Topics Concern  . Not on file  Social History Narrative   Lives with his wife and daughter.   Other child lives independently in [place].   Drinks about 3 sodas a day  Social Determinants of Health   Financial Resource Strain: Not on file  Food Insecurity: Not on file  Transportation Needs: Not on file  Physical Activity: Not on file  Stress: Not on file  Social Connections: Not on file  Intimate Partner Violence: Not on file    Outpatient Medications Prior to Visit  Medication Sig Dispense Refill  . amLODipine (NORVASC) 5 MG tablet Take 1 tablet (5 mg total) by mouth daily. 90 tablet 3  . hydrochlorothiazide (HYDRODIURIL) 25 MG tablet Take 1 tablet (25 mg total) by mouth daily. 90 tablet 0  . albuterol (VENTOLIN HFA) 108 (90 Base) MCG/ACT inhaler Inhale 2 puffs into the lungs every 4 (four) hours as needed for wheezing or shortness of breath (cough, shortness of breath or wheezing.). (Patient not taking: Reported on 11/12/2019) 1 each 3    Facility-Administered Medications Prior to Visit  Medication Dose Route Frequency Provider Last Rate Last Admin  . cloNIDine (CATAPRES) tablet 0.2 mg  0.2 mg Oral Once Vevelyn Francois, NP        No Known Allergies  ROS Review of Systems    Objective:    Physical Exam HENT:     Head: Normocephalic.     Comments: Facial swellling Cardiovascular:     Rate and Rhythm: Normal rate and regular rhythm.  Pulmonary:     Effort: Pulmonary effort is normal.     Breath sounds: Normal breath sounds.  Abdominal:     Palpations: Abdomen is soft.  Musculoskeletal:     Cervical back: Normal range of motion.     Right lower leg: Edema present.     Left lower leg: Edema present.  Skin:    General: Skin is warm and dry.     Capillary Refill: Capillary refill takes less than 2 seconds.  Neurological:     General: No focal deficit present.     Mental Status: He is alert and oriented to person, place, and time.  Psychiatric:        Mood and Affect: Mood normal.        Behavior: Behavior normal.        Thought Content: Thought content normal.        Judgment: Judgment normal.     BP 137/75   Pulse 95   Temp (!) 97.3 F (36.3 C) (Temporal)   Ht 5' 6.5" (1.689 m)   Wt (!) 318 lb (144.2 kg)   SpO2 97%   BMI 50.56 kg/m  Wt Readings from Last 3 Encounters:  03/02/20 (!) 318 lb (144.2 kg)  11/12/19 (!) 302 lb 12.8 oz (137.3 kg)  10/27/19 (!) 305 lb (138.3 kg)     There are no preventive care reminders to display for this patient.  There are no preventive care reminders to display for this patient.  Lab Results  Component Value Date   TSH 3.060 01/23/2016   Lab Results  Component Value Date   WBC 5.5 07/16/2019   HGB 14.9 07/16/2019   HCT 44.6 07/16/2019   MCV 88 07/16/2019   PLT 204 07/16/2019   Lab Results  Component Value Date   NA 143 03/02/2020   K 4.1 03/02/2020   CO2 33 (H) 12/09/2017   GLUCOSE 122 (H) 03/02/2020   BUN 15 03/02/2020   CREATININE 1.18  03/02/2020   BILITOT 0.7 03/02/2020   ALKPHOS 63 03/02/2020   AST 18 03/02/2020   ALT 14 12/09/2017   PROT 7.0 03/02/2020   ALBUMIN 4.2 03/02/2020  CALCIUM 9.0 03/02/2020   ANIONGAP 7 12/09/2017   Lab Results  Component Value Date   CHOL 220 (H) 03/02/2020   Lab Results  Component Value Date   HDL 36 (L) 03/02/2020   Lab Results  Component Value Date   LDLCALC 154 (H) 03/02/2020   Lab Results  Component Value Date   TRIG 166 (H) 03/02/2020   Lab Results  Component Value Date   CHOLHDL 6.1 (H) 03/02/2020   Lab Results  Component Value Date   HGBA1C 6.1 (A) 07/16/2019   HGBA1C 6.1 07/16/2019   HGBA1C 6.1 07/16/2019   HGBA1C 6.1 07/16/2019      Assessment & Plan:   Problem List Items Addressed This Visit      Cardiovascular and Mediastinum   Acute diastolic CHF (congestive heart failure) ([place]) Worsening encouraged compliance with all medications.    Relevant Medications   cloNIDine (CATAPRES) tablet 0.2 mg   cloNIDine (CATAPRES) tablet 0.3 mg   hydrochlorothiazide (HYDRODIURIL) 25 MG tablet   Essential hypertension, benign - Primary Uncontrolled due to lack of adherence to medication regimen Encouraged on going compliance with current medication regimen Encouraged home monitoring and recording BP <130/80 Eating a heart-healthy diet with less salt Encouraged regular physical activity  Recommend Weight loss   Relevant Medications   cloNIDine (CATAPRES) tablet 0.2 mg   cloNIDine (CATAPRES) tablet 0.3 mg   hydrochlorothiazide (HYDRODIURIL) 25 MG tablet   Other Relevant Orders   Comp. Metabolic Panel (12) (Completed)     Respiratory   Acute respiratory failure (HCC) Resolved     Digestive   Pseudopolyposis of colon without complication ([place]) Stable follow up with GI as scheduled     Other   Pre-diabetes   Relevant Orders   POCT glycosylated hemoglobin (Hb A1C)    Other Visit Diagnoses    Hypertensive emergency     Long discussion with patient  related to the dangers of nonadherenance to medication and the risk of stroke or MI. 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  Discussed proper diet (low fat, low sodium, high fiber) with patient.   Discussed need for regular exercise (3 times per week, 20 minutes per session) with patient.       Meds ordered this encounter  Medications  . cloNIDine (CATAPRES) tablet 0.2 mg  . cloNIDine (CATAPRES) tablet 0.3 mg  . DISCONTD: amLODipine-olmesartan (AZOR) 10-40 MG tablet    Sig: Take 1 tablet by mouth daily.    Dispense:  30 tablet    Refill:  11    Order Specific Question:   Supervising Provider    Answer:   Tresa Garter [id number]  . hydrochlorothiazide (HYDRODIURIL) 25 MG tablet    Sig: Take 1 tablet (25 mg total) by mouth daily.    Dispense:  90 tablet    Refill:  3    Order Specific Question:  Supervising Provider    Answer:   Tresa Garter [id number]  . amoxicillin-clavulanate (AUGMENTIN) 875-125 MG tablet    Sig: Take 1 tablet by mouth 2 (two) times daily for 10 days.    Dispense:  20 tablet    Refill:  0    Order Specific Question:   Supervising Provider    Answer:   Tresa Garter [id number]    Follow-up: Return in about 4 weeks (around 03/30/2020) for Follow up HTN [id number].    Vevelyn Francois, NP

## 2020-03-03 ENCOUNTER — Other Ambulatory Visit: Payer: Self-pay | Admitting: Nurse Practitioner

## 2020-03-03 LAB — POCT GLYCOSYLATED HEMOGLOBIN (HGB A1C): HbA1c, POC (prediabetic range): 6 % (ref 5.7–6.4)

## 2020-03-03 LAB — COMP. METABOLIC PANEL (12)
AST: 18 IU/L (ref 0–40)
Albumin/Globulin Ratio: 1.5 (ref 1.2–2.2)
Albumin: 4.2 g/dL (ref 3.8–4.9)
Alkaline Phosphatase: 63 IU/L (ref 44–121)
BUN/Creatinine Ratio: 13 (ref 9–20)
BUN: 15 mg/dL (ref 6–24)
Bilirubin Total: 0.7 mg/dL (ref 0.0–1.2)
Calcium: 9 mg/dL (ref 8.7–10.2)
Chloride: 101 mmol/L (ref 96–106)
Creatinine, Ser: 1.18 mg/dL (ref 0.76–1.27)
GFR calc Af Amer: 80 mL/min/{1.73_m2} (ref >59)
GFR calc non Af Amer: 70 mL/min/{1.73_m2} (ref >59)
Globulin, Total: 2.8 g/dL (ref 1.5–4.5)
Glucose: 122 mg/dL — ABNORMAL HIGH (ref 65–99)
Potassium: 4.1 mmol/L (ref 3.5–5.2)
Sodium: 143 mmol/L (ref 134–144)
Total Protein: 7 g/dL (ref 6.0–8.5)

## 2020-03-03 LAB — LIPID PANEL
Chol/HDL Ratio: 6.1 ratio — ABNORMAL HIGH (ref 0.0–5.0)
Cholesterol, Total: 220 mg/dL — ABNORMAL HIGH (ref 100–199)
HDL: 36 mg/dL — ABNORMAL LOW (ref >39)
LDL Chol Calc (NIH): 154 mg/dL — ABNORMAL HIGH (ref 0–99)
Triglycerides: 166 mg/dL — ABNORMAL HIGH (ref 0–149)
VLDL Cholesterol Cal: 30 mg/dL (ref 5–40)

## 2020-03-03 MED ORDER — VALSARTAN 160 MG PO TABS: 160.0000 mg | ORAL_TABLET | Freq: Every day | ORAL | 3 refills | Status: DC

## 2020-03-03 MED ORDER — AMLODIPINE BESYLATE 10 MG PO TABS: 10.0000 mg | ORAL_TABLET | Freq: Every day | ORAL | 3 refills | Status: DC

## 2020-03-03 NOTE — Progress Notes (Unsigned)
   [facility] [address] [place], [place]  [address] Phone:  [phone]   Fax:  [phone] 

## 2020-03-29 ENCOUNTER — Ambulatory Visit (INDEPENDENT_AMBULATORY_CARE_PROVIDER_SITE_OTHER): Payer: 59 | Admitting: Nurse Practitioner

## 2020-03-29 ENCOUNTER — Other Ambulatory Visit: Payer: Self-pay

## 2020-03-29 ENCOUNTER — Encounter: Payer: Self-pay | Admitting: Nurse Practitioner

## 2020-03-29 VITALS — BP 157/80 | HR 89 | Temp 98.1°F | Resp 20 | Ht 66.0 in | Wt 320.0 lb

## 2020-03-29 DIAGNOSIS — G4733 Obstructive sleep apnea (adult) (pediatric): Secondary | ICD-10-CM | POA: Diagnosis not present

## 2020-03-29 DIAGNOSIS — R7303 Prediabetes: Secondary | ICD-10-CM

## 2020-03-29 DIAGNOSIS — I10 Essential (primary) hypertension: Secondary | ICD-10-CM | POA: Diagnosis not present

## 2020-03-29 DIAGNOSIS — R079 Chest pain, unspecified: Secondary | ICD-10-CM

## 2020-03-29 DIAGNOSIS — Z6841 Body Mass Index (BMI) 40.0 and over, adult: Secondary | ICD-10-CM | POA: Diagnosis not present

## 2020-03-29 NOTE — Progress Notes (Signed)
Established Patient Office Visit  Subjective:  Patient ID: Randy Morgan, male    DOB: 06-20-1965  Age: 55 y.o. MRN: [id number]  CC: No chief complaint on file.   HPI Colburn Asper presents for follow up. He  has a past medical history of Asthma, Hypertension, and Sleep apnea.   Hypertension Patient is here for follow-up of elevated blood pressure. He is not exercising and is adherent to a low-salt diet. Blood pressure is not monitored at home. Cardiac symptoms: chest pain, dyspnea and lower extremity edema. Patient denies claudication, fatigue, irregular heart beat, near-syncope, orthopnea, palpitations and syncope. Cardiovascular risk factors: dyslipidemia, hypertension, male gender, obesity (BMI >= 30 kg/m2), sedentary lifestyle and smoking/ tobacco exposure. Use of agents associated with hypertension: none. History of target organ damage: none.  Past Medical History:  Diagnosis Date  . Asthma   . Hypertension   . Sleep apnea    hasn't started CPAP yet    Past Surgical History:  Procedure Laterality Date  . NO PAST SURGERIES    . VIDEO BRONCHOSCOPY Bilateral 12/09/2017   Procedure: VIDEO BRONCHOSCOPY WITHOUT FLUORO;  Surgeon: Laurin Coder, MD;  Location: [facility];  Service: Cardiopulmonary;  Laterality: Bilateral;    Family History  Problem Relation Age of Onset  . Heart disease Mother   . Diabetes Mother   . Asthma Mother   . Heart disease Father   . Asthma Sister   . Heart disease Brother   . Heart attack Brother   . Colon cancer Neg Hx   . Esophageal cancer Neg Hx   . Rectal cancer Neg Hx   . Stomach cancer Neg Hx     Social History   Socioeconomic History  . Marital status: Married    Spouse name: Not on file  . Number of children: 2  . Years of education: 12th grade + 2 years college  . Highest education level: Not on file  Occupational History  . Occupation: makes concrete blocks and bricks  Tobacco Use  . Smoking status: Current Some Day  Smoker    Packs/day: 0.50    Years: 25.00    Pack years: 12.50    Types: Cigarettes  . Smokeless tobacco: Never Used  Vaping Use  . Vaping Use: Never used  Substance and Sexual Activity  . Alcohol use: Not Currently    Alcohol/week: 2.0 standard drinks    Types: 2 Cans of beer per week    Comment: occ beer  . Drug use: No  . Sexual activity: Not Currently  Other Topics Concern  . Not on file  Social History Narrative   Lives with his wife and daughter.   Other child lives independently in [place].   Drinks about 3 sodas a day    Social Determinants of Radio broadcast assistant Strain: Not on file  Food Insecurity: Not on file  Transportation Needs: Not on file  Physical Activity: Not on file  Stress: Not on file  Social Connections: Not on file  Intimate Partner Violence: Not on file    Outpatient Medications Prior to Visit  Medication Sig Dispense Refill  . amLODipine (NORVASC) 10 MG tablet Take 1 tablet (10 mg total) by mouth daily. 90 tablet 3  . valsartan (DIOVAN) 160 MG tablet Take 1 tablet (160 mg total) by mouth daily. 90 tablet 3  . hydrochlorothiazide (HYDRODIURIL) 25 MG tablet Take 1 tablet (25 mg total) by mouth daily. 90 tablet 3   Facility-Administered Medications Prior to Visit  Medication Dose Route Frequency Provider Last Rate Last Admin  . cloNIDine (CATAPRES) tablet 0.2 mg  0.2 mg Oral Once Vevelyn Francois, NP      . cloNIDine (CATAPRES) tablet 0.2 mg  0.2 mg Oral Once Vevelyn Francois, NP      . cloNIDine (CATAPRES) tablet 0.3 mg  0.3 mg Oral Once Vevelyn Francois, NP        No Known Allergies  ROS Review of Systems  Constitutional: Negative.   HENT: Negative.   Eyes: Negative.   Respiratory: Positive for chest tightness (comes and goes) and shortness of breath (occasional).   Cardiovascular: Positive for chest pain (sometimes noted with waking in the morning. Has has a sleep studye and has not been able to get CPAP) and leg swelling (Off and  on it depends).  Endocrine: Negative.   Genitourinary:       Nocturia he feels like it related to HCTZ    Musculoskeletal: Negative.   Skin: Negative.   Allergic/Immunologic: Negative.   Neurological: Negative for dizziness and headaches.      Objective:    Physical Exam Constitutional:      Appearance: He is obese.  HENT:     Head: Normocephalic and atraumatic.     Nose: Nose normal.     Mouth/Throat:     Mouth: Mucous membranes are moist.  Cardiovascular:     Rate and Rhythm: Normal rate and regular rhythm.     Pulses: Normal pulses.     Heart sounds: Normal heart sounds.  Pulmonary:     Effort: Pulmonary effort is normal.     Breath sounds: Normal breath sounds.  Abdominal:     Palpations: Abdomen is soft.  Musculoskeletal:        General: Normal range of motion.     Cervical back: Normal range of motion.     Right lower leg: Edema (trace) present.     Left lower leg: Edema (trace) present.  Skin:    General: Skin is warm.     Capillary Refill: Capillary refill takes less than 2 seconds.  Neurological:     General: No focal deficit present.     Mental Status: He is alert and oriented to person, place, and time.  Psychiatric:        Mood and Affect: Mood normal.        Behavior: Behavior normal.        Thought Content: Thought content normal.        Judgment: Judgment normal.     BP (!) 157/80 (BP Location: Left Arm, Patient Position: Sitting, Cuff Size: Large)   Pulse 89   Temp 98.1 F (36.7 C)   Resp 20   Ht 5\' 6"  (1.676 m)   Wt (!) 320 lb (145.2 kg)   SpO2 98%   BMI 51.65 kg/m  Wt Readings from Last 3 Encounters:  03/29/20 (!) 320 lb (145.2 kg)  03/02/20 (!) 318 lb (144.2 kg)  11/12/19 (!) 302 lb 12.8 oz (137.3 kg)     There are no preventive care reminders to display for this patient.  There are no preventive care reminders to display for this patient.  Lab Results  Component Value Date   TSH 3.060 01/23/2016   Lab Results  Component  Value Date   WBC 5.5 07/16/2019   HGB 14.9 07/16/2019   HCT 44.6 07/16/2019   MCV 88 07/16/2019   PLT 204 07/16/2019   Lab Results  Component Value  Date   NA 143 03/02/2020   K 4.1 03/02/2020   CO2 33 (H) 12/09/2017   GLUCOSE 122 (H) 03/02/2020   BUN 15 03/02/2020   CREATININE 1.18 03/02/2020   BILITOT 0.7 03/02/2020   ALKPHOS 63 03/02/2020   AST 18 03/02/2020   ALT 14 12/09/2017   PROT 7.0 03/02/2020   ALBUMIN 4.2 03/02/2020   CALCIUM 9.0 03/02/2020   ANIONGAP 7 12/09/2017   Lab Results  Component Value Date   CHOL 220 (H) 03/02/2020   Lab Results  Component Value Date   HDL 36 (L) 03/02/2020   Lab Results  Component Value Date   LDLCALC 154 (H) 03/02/2020   Lab Results  Component Value Date   TRIG 166 (H) 03/02/2020   Lab Results  Component Value Date   CHOLHDL 6.1 (H) 03/02/2020   Lab Results  Component Value Date   HGBA1C 6.0 03/01/2020      Assessment & Plan:   Problem List Items Addressed This Visit      Cardiovascular and Mediastinum   Essential hypertension, benign - Primary Improving with current regimen Referral to cardiology due to chest pain and previous HTN of acute CHF last BNP normal at 51.0 Discussed low dose statin recommend Rosuvastatin however jsut getting pt compliant with his antihypertensive regimen   Encouraged on going compliance with current medication regimen Encouraged home monitoring and recording BP <130/80 Eating a heart-healthy diet with less salt Encouraged regular physical activity  Recommend Weight loss     Other   Pre-diabetes Would possibly benefit from metformin however patients is not very compliant with medications.  Consider home glucose monitoring Weight loss at least 5% of current body weight is can be achieved with lifestyle modification dietary changes and regular daily exercise Encourage blood pressure control goal <120/80 and maintaining total cholesterol <200 Follow-up every 3 to 6 months for  reevaluation Education material provided     Other Visit Diagnoses    OSA (obstructive sleep apnea)     Will reach out [employer] concerning his insurance denial for CPAP especially with the patient 3 rd shifot work schedule and waking with chest pain.    BMI 50.0-59.9, adult (HCC)     Persistent  Obesity with BMI and comorbidities as noted above.  Discussed proper diet (low fat, low sodium, high fiber) with patient.  Discussed need for regular exercise (3 times per week, 20 minutes per session) with patient.       No orders of the defined types were placed in this encounter.   Follow-up: Return in about 2 months (around 05/29/2020).    Vevelyn Francois, NP

## 2020-03-29 NOTE — Patient Instructions (Signed)
Managing Your Hypertension Hypertension, also called high blood pressure, is when the force of the blood pressing against the walls of the arteries is too strong. Arteries are blood vessels that carry blood from your heart throughout your body. Hypertension forces the heart to work harder to pump blood and may cause the arteries to become narrow or stiff. Understanding blood pressure readings Your personal target blood pressure may vary depending on your medical conditions, your age, and other factors. A blood pressure reading includes a higher number over a lower number. Ideally, your blood pressure should be below 120/80. You should know that:  The first, or top, number is called the systolic pressure. It is a measure of the pressure in your arteries as your heart beats.  The second, or bottom number, is called the diastolic pressure. It is a measure of the pressure in your arteries as the heart relaxes. Blood pressure is classified into four stages. Based on your blood pressure reading, your health care provider may use the following stages to determine what type of treatment you need, if any. Systolic pressure and diastolic pressure are measured in a unit called mmHg. Normal  Systolic pressure: below 120.  Diastolic pressure: below 80. Elevated  Systolic pressure: 120-129.  Diastolic pressure: below 80. Hypertension stage 1  Systolic pressure: 130-139.  Diastolic pressure: 80-89. Hypertension stage 2  Systolic pressure: 140 or above.  Diastolic pressure: 90 or above. How can this condition affect me? Managing your hypertension is an important responsibility. Over time, hypertension can damage the arteries and decrease blood flow to important parts of the body, including the brain, heart, and kidneys. Having untreated or uncontrolled hypertension can lead to:  A heart attack.  A stroke.  A weakened blood vessel (aneurysm).  Heart failure.  Kidney damage.  Eye  damage.  Metabolic syndrome.  Memory and concentration problems.  Vascular dementia. What actions can I take to manage this condition? Hypertension can be managed by making lifestyle changes and possibly by taking medicines. Your health care provider will help you make a plan to bring your blood pressure within a normal range. Nutrition  Eat a diet that is high in fiber and potassium, and low in salt (sodium), added sugar, and fat. An example eating plan is called the Dietary Approaches to Stop Hypertension (DASH) diet. To eat this way: ? Eat plenty of fresh fruits and vegetables. Try to fill one-half of your plate at each meal with fruits and vegetables. ? Eat whole grains, such as whole-wheat pasta, brown rice, or whole-grain bread. Fill about one-fourth of your plate with whole grains. ? Eat low-fat dairy products. ? Avoid fatty cuts of meat, processed or cured meats, and poultry with skin. Fill about one-fourth of your plate with lean proteins such as fish, chicken without skin, beans, eggs, and tofu. ? Avoid pre-made and processed foods. These tend to be higher in sodium, added sugar, and fat.  Reduce your daily sodium intake. Most people with hypertension should eat less than 1,500 mg of sodium a day.   Lifestyle  Work with your health care provider to maintain a healthy body weight or to lose weight. Ask what an ideal weight is for you.  Get at least 30 minutes of exercise that causes your heart to beat faster (aerobic exercise) most days of the week. Activities may include walking, swimming, or biking.  Include exercise to strengthen your muscles (resistance exercise), such as weight lifting, as part of your weekly exercise routine. Try   to do these types of exercises for 30 minutes at least 3 days a week.  Do not use any products that contain nicotine or tobacco, such as cigarettes, e-cigarettes, and chewing tobacco. If you need help quitting, ask your health care  provider.  Control any long-term (chronic) conditions you have, such as high cholesterol or diabetes.  Identify your sources of stress and find ways to manage stress. This may include meditation, deep breathing, or making time for fun activities.   Alcohol use  Do not drink alcohol if: ? Your health care provider tells you not to drink. ? You are pregnant, may be pregnant, or are planning to become pregnant.  If you drink alcohol: ? Limit how much you use to:  0-1 drink a day for women.  0-2 drinks a day for men. ? Be aware of how much alcohol is in your drink. In the [place] one drink equals one 12 oz bottle of beer (355 mL), one 5 oz glass of wine (148 mL), or one 1 oz glass of hard liquor (44 mL). Medicines Your health care provider may prescribe medicine if lifestyle changes are not enough to get your blood pressure under control and if:  Your systolic blood pressure is 130 or higher.  Your diastolic blood pressure is 80 or higher. Take medicines only as told by your health care provider. Follow the directions carefully. Blood pressure medicines must be taken as told by your health care provider. The medicine does not work as well when you skip doses. Skipping doses also puts you at risk for problems. Monitoring Before you monitor your blood pressure:  Do not smoke, drink caffeinated beverages, or exercise within 30 minutes before taking a measurement.  Use the bathroom and empty your bladder (urinate).  Sit quietly for at least 5 minutes before taking measurements. Monitor your blood pressure at home as told by your health care provider. To do this:  Sit with your back straight and supported.  Place your feet flat on the floor. Do not cross your legs.  Support your arm on a flat surface, such as a table. Make sure your upper arm is at heart level.  Each time you measure, take two or three readings one minute apart and record the results. You may also need to have your  blood pressure checked regularly by your health care provider.   General information  Talk with your health care provider about your diet, exercise habits, and other lifestyle factors that may be contributing to hypertension.  Review all the medicines you take with your health care provider because there may be side effects or interactions.  Keep all visits as told by your health care provider. Your health care provider can help you create and adjust your plan for managing your high blood pressure. Where to find more information  National Heart, Lung, and Blood Institute: www.nhlbi.nih.gov  American Heart Association: www.heart.org Contact a health care provider if:  You think you are having a reaction to medicines you have taken.  You have repeated (recurrent) headaches.  You feel dizzy.  You have swelling in your ankles.  You have trouble with your vision. Get help right away if:  You develop a severe headache or confusion.  You have unusual weakness or numbness, or you feel faint.  You have severe pain in your chest or abdomen.  You vomit repeatedly.  You have trouble breathing. These symptoms may represent a serious problem that is an emergency. Do not wait   to see if the symptoms will go away. Get medical help right away. Call your local emergency services (911 in the [place] Do not drive yourself to the hospital. Summary  Hypertension is when the force of blood pumping through your arteries is too strong. If this condition is not controlled, it may put you at risk for serious complications.  Your personal target blood pressure may vary depending on your medical conditions, your age, and other factors. For most people, a normal blood pressure is less than 120/80.  Hypertension is managed by lifestyle changes, medicines, or both.  Lifestyle changes to help manage hypertension include losing weight, eating a healthy, low-sodium diet, exercising more, stopping smoking, and  limiting alcohol. This information is not intended to replace advice given to you by your health care provider. Make sure you discuss any questions you have with your health care provider. Document Revised: 02/19/2019 Document Reviewed: 12/15/2018 Elsevier Patient Education  2021 Elsevier Inc.  

## 2020-04-13 ENCOUNTER — Encounter: Payer: Self-pay | Admitting: General Practice

## 2020-05-29 ENCOUNTER — Ambulatory Visit (INDEPENDENT_AMBULATORY_CARE_PROVIDER_SITE_OTHER): Payer: 59 | Admitting: Nurse Practitioner

## 2020-05-29 ENCOUNTER — Other Ambulatory Visit: Payer: Self-pay

## 2020-05-29 ENCOUNTER — Encounter: Payer: Self-pay | Admitting: Nurse Practitioner

## 2020-05-29 VITALS — BP 154/99 | HR 94 | Temp 97.5°F | Ht 66.0 in | Wt 312.8 lb

## 2020-05-29 DIAGNOSIS — F17209 Nicotine dependence, unspecified, with unspecified nicotine-induced disorders: Secondary | ICD-10-CM

## 2020-05-29 DIAGNOSIS — R7303 Prediabetes: Secondary | ICD-10-CM

## 2020-05-29 DIAGNOSIS — I5031 Acute diastolic (congestive) heart failure: Secondary | ICD-10-CM | POA: Diagnosis not present

## 2020-05-29 DIAGNOSIS — I161 Hypertensive emergency: Secondary | ICD-10-CM

## 2020-05-29 DIAGNOSIS — Z6841 Body Mass Index (BMI) 40.0 and over, adult: Secondary | ICD-10-CM

## 2020-05-29 DIAGNOSIS — G4733 Obstructive sleep apnea (adult) (pediatric): Secondary | ICD-10-CM

## 2020-05-29 MED ORDER — CLONIDINE HCL 0.1 MG PO TABS
0.2000 mg | ORAL_TABLET | Freq: Once | ORAL | Status: AC
  Administered 2020-05-29: 0.2 mg via ORAL

## 2020-05-29 NOTE — Progress Notes (Signed)
[place]   [address] Phone:  [phone]   Fax:  [phone]   Established Patient Office Visit  Subjective:  Patient ID: Randy Morgan, male    DOB: [birth year]  Age: 55 y.o. MRN: [id number]  CC:  Chief Complaint  Patient presents with  . Follow-up    Follow up  checking  b/p at home it up and down     HPI Randy Morgan presents for follow up. He  has a past medical history of Asthma, Hypertension, and Sleep apnea.   Hypertension Patient is here for follow-up of elevated blood pressure. He admits that he has not taken his medication. His last dose was 05/24/20.  He is exercising and is adherent to a low-salt diet. Blood pressure is well controlled at home. Cardiac symptoms: none. Patient denies chest pain and exertional chest pressure/discomfort. Cardiovascular risk factors: advanced age (older than 38 for men, 47 for women), dyslipidemia, family history of premature cardiovascular disease, male gender, obesity (BMI >= 30 kg/m2), sedentary lifestyle and smoking/ tobacco exposure. Use of agents associated with hypertension: none. History of target organ damage: none.  Past Medical History:  Diagnosis Date  . Asthma   . Hypertension   . Sleep apnea    hasn't started CPAP yet    Past Surgical History:  Procedure Laterality Date  . NO PAST SURGERIES    . VIDEO BRONCHOSCOPY Bilateral 12/09/2017   Procedure: VIDEO BRONCHOSCOPY WITHOUT FLUORO;  Surgeon: Laurin Coder, MD;  Location: [facility];  Service: Cardiopulmonary;  Laterality: Bilateral;    Family History  Problem Relation Age of Onset  . Heart disease Mother   . Diabetes Mother   . Asthma Mother   . Heart disease Father   . Asthma Sister   . Heart disease Brother   . Heart attack Brother   . Colon cancer Neg Hx   . Esophageal cancer Neg Hx   . Rectal cancer Neg Hx   . Stomach cancer Neg Hx     Social History   Socioeconomic History  . Marital  status: Married    Spouse name: Not on file  . Number of children: 2  . Years of education: 12th grade + 2 years college  . Highest education level: Not on file  Occupational History  . Occupation: makes concrete blocks and bricks  Tobacco Use  . Smoking status: Current Some Day Smoker    Packs/day: 0.50    Years: 25.00    Pack years: 12.50    Types: Cigarettes  . Smokeless tobacco: Never Used  Vaping Use  . Vaping Use: Never used  Substance and Sexual Activity  . Alcohol use: Not Currently    Alcohol/week: 2.0 standard drinks    Types: 2 Cans of beer per week    Comment: occ beer  . Drug use: No  . Sexual activity: Not Currently  Other Topics Concern  . Not on file  Social History Narrative   Lives with his wife and daughter.   Other child lives independently in [place].   Drinks about 3 sodas a day    Social Determinants of Radio broadcast assistant Strain: Not on file  Food Insecurity: Not on file  Transportation Needs: Not on file  Physical Activity: Not on file  Stress: Not on file  Social Connections: Not on file  Intimate Partner Violence: Not on file    Outpatient Medications Prior to Visit  Medication Sig  Dispense Refill  . amLODipine (NORVASC) 10 MG tablet Take 1 tablet (10 mg total) by mouth daily. 90 tablet 3  . hydrochlorothiazide (HYDRODIURIL) 25 MG tablet Take 1 tablet (25 mg total) by mouth daily. 90 tablet 3  . valsartan (DIOVAN) 160 MG tablet Take 1 tablet (160 mg total) by mouth daily. 90 tablet 3  . cloNIDine (CATAPRES) tablet 0.2 mg     . cloNIDine (CATAPRES) tablet 0.2 mg     . cloNIDine (CATAPRES) tablet 0.3 mg      No facility-administered medications prior to visit.    No Known Allergies  ROS Review of Systems  Respiratory: Positive for shortness of breath (allergies no treatment avoid outside) and wheezing (occasional).   Neurological: Negative for dizziness and headaches.      Objective:    Physical Exam Constitutional:       Appearance: He is obese.  HENT:     Head: Normocephalic and atraumatic.  Cardiovascular:     Rate and Rhythm: Normal rate.     Pulses: Normal pulses.     Heart sounds: Normal heart sounds.  Pulmonary:     Effort: Pulmonary effort is normal.     Breath sounds: Normal breath sounds.  Abdominal:     Palpations: Abdomen is soft.     Comments: Increased abdominal girth    Musculoskeletal:     Cervical back: Normal range of motion.     Right lower leg: Edema (trace) present.     Left lower leg: Edema (trace) present.  Skin:    General: Skin is warm and dry.     Capillary Refill: Capillary refill takes less than 2 seconds.  Neurological:     General: No focal deficit present.     Mental Status: He is alert.  Psychiatric:        Mood and Affect: Mood normal.        Behavior: Behavior normal.        Thought Content: Thought content normal.        Judgment: Judgment normal.     BP (!) 170/105 (BP Location: Left Arm, Patient Position: Sitting, Cuff Size: Normal)   Pulse 94   Temp (!) 97.5 F (36.4 C) (Temporal)   Ht 5\' 6"  (1.676 m)   Wt (!) 312 lb 12.8 oz (141.9 kg)   SpO2 98%   BMI 50.49 kg/m  Wt Readings from Last 3 Encounters:  05/29/20 (!) 312 lb 12.8 oz (141.9 kg)  03/29/20 (!) 320 lb (145.2 kg)  03/02/20 (!) 318 lb (144.2 kg)     There are no preventive care reminders to display for this patient.  There are no preventive care reminders to display for this patient.  Lab Results  Component Value Date   TSH 3.060 01/23/2016   Lab Results  Component Value Date   WBC 5.5 07/16/2019   HGB 14.9 07/16/2019   HCT 44.6 07/16/2019   MCV 88 07/16/2019   PLT 204 07/16/2019   Lab Results  Component Value Date   NA 143 03/02/2020   K 4.1 03/02/2020   CO2 33 (H) 12/09/2017   GLUCOSE 122 (H) 03/02/2020   BUN 15 03/02/2020   CREATININE 1.18 03/02/2020   BILITOT 0.7 03/02/2020   ALKPHOS 63 03/02/2020   AST 18 03/02/2020   ALT 14 12/09/2017   PROT 7.0 03/02/2020    ALBUMIN 4.2 03/02/2020   CALCIUM 9.0 03/02/2020   ANIONGAP 7 12/09/2017   Lab Results  Component Value Date  CHOL 220 (H) 03/02/2020   Lab Results  Component Value Date   HDL 36 (L) 03/02/2020   Lab Results  Component Value Date   LDLCALC 154 (H) 03/02/2020   Lab Results  Component Value Date   TRIG 166 (H) 03/02/2020   Lab Results  Component Value Date   CHOLHDL 6.1 (H) 03/02/2020   Lab Results  Component Value Date   HGBA1C 6.0 03/01/2020      Assessment & Plan:   Problem List Items Addressed This Visit      Cardiovascular and Mediastinum   Acute diastolic CHF (congestive heart failure) ([place]) Stable     Other Visit Diagnoses    Hypertensive emergency    -  Primary Persistent  Due to non adherence to treatment regimen  Encouraged on going compliance with current medication regimen Encouraged home monitoring and recording BP <130/80 Eating a heart-healthy diet with less salt Encouraged regular physical activity  Recommend Weight loss   Relevant Medications   cloNIDine (CATAPRES) tablet 0.2 mg (Completed)   Tobacco use disorder, continuous     Discussed the risk factors associated with smoking ; CAD, COPD, Cancer, PVD increased susceptibility to respiratory illnesses Discussed treatment options with cessation ie counseling, support resources and available medications  Choosing a quit day and setting goals accordingly. Discussed ways to quit; start by decreasing one cigarette per day or per week.  Encourage patient to call for assistance once ready to quit. Provided education handouts   Counseling 5-10 minutes    BMI 50.0-59.9, adult ([place])     Obesity with BMI and comorbidities as noted above.  Discussed proper diet (low fat, low sodium, high fiber) with patient.   Discussed need for regular exercise (3 times per week, 20 minutes per session) with patient.    OSA (obstructive sleep apnea)     Unable to receive CPAP machine based on insurance No  explanation to why       Meds ordered this encounter  Medications  . cloNIDine (CATAPRES) tablet 0.2 mg    Follow-up: Return in about 4 weeks (around 06/26/2020) for Follow up HTN 32122.    Vevelyn Francois, NP

## 2020-05-29 NOTE — Patient Instructions (Signed)
Managing Your Hypertension Hypertension, also called high blood pressure, is when the force of the blood pressing against the walls of the arteries is too strong. Arteries are blood vessels that carry blood from your heart throughout your body. Hypertension forces the heart to work harder to pump blood and may cause the arteries to become narrow or stiff. Understanding blood pressure readings Your personal target blood pressure may vary depending on your medical conditions, your age, and other factors. A blood pressure reading includes a higher number over a lower number. Ideally, your blood pressure should be below 120/80. You should know that:  The first, or top, number is called the systolic pressure. It is a measure of the pressure in your arteries as your heart beats.  The second, or bottom number, is called the diastolic pressure. It is a measure of the pressure in your arteries as the heart relaxes. Blood pressure is classified into four stages. Based on your blood pressure reading, your health care provider may use the following stages to determine what type of treatment you need, if any. Systolic pressure and diastolic pressure are measured in a unit called mmHg. Normal  Systolic pressure: below 120.  Diastolic pressure: below 80. Elevated  Systolic pressure: 120-129.  Diastolic pressure: below 80. Hypertension stage 1  Systolic pressure: 130-139.  Diastolic pressure: 80-89. Hypertension stage 2  Systolic pressure: 140 or above.  Diastolic pressure: 90 or above. How can this condition affect me? Managing your hypertension is an important responsibility. Over time, hypertension can damage the arteries and decrease blood flow to important parts of the body, including the brain, heart, and kidneys. Having untreated or uncontrolled hypertension can lead to:  A heart attack.  A stroke.  A weakened blood vessel (aneurysm).  Heart failure.  Kidney damage.  Eye  damage.  Metabolic syndrome.  Memory and concentration problems.  Vascular dementia. What actions can I take to manage this condition? Hypertension can be managed by making lifestyle changes and possibly by taking medicines. Your health care provider will help you make a plan to bring your blood pressure within a normal range. Nutrition  Eat a diet that is high in fiber and potassium, and low in salt (sodium), added sugar, and fat. An example eating plan is called the Dietary Approaches to Stop Hypertension (DASH) diet. To eat this way: ? Eat plenty of fresh fruits and vegetables. Try to fill one-half of your plate at each meal with fruits and vegetables. ? Eat whole grains, such as whole-wheat pasta, brown rice, or whole-grain bread. Fill about one-fourth of your plate with whole grains. ? Eat low-fat dairy products. ? Avoid fatty cuts of meat, processed or cured meats, and poultry with skin. Fill about one-fourth of your plate with lean proteins such as fish, chicken without skin, beans, eggs, and tofu. ? Avoid pre-made and processed foods. These tend to be higher in sodium, added sugar, and fat.  Reduce your daily sodium intake. Most people with hypertension should eat less than 1,500 mg of sodium a day.   Lifestyle  Work with your health care provider to maintain a healthy body weight or to lose weight. Ask what an ideal weight is for you.  Get at least 30 minutes of exercise that causes your heart to beat faster (aerobic exercise) most days of the week. Activities may include walking, swimming, or biking.  Include exercise to strengthen your muscles (resistance exercise), such as weight lifting, as part of your weekly exercise routine. Try   to do these types of exercises for 30 minutes at least 3 days a week.  Do not use any products that contain nicotine or tobacco, such as cigarettes, e-cigarettes, and chewing tobacco. If you need help quitting, ask your health care  provider.  Control any long-term (chronic) conditions you have, such as high cholesterol or diabetes.  Identify your sources of stress and find ways to manage stress. This may include meditation, deep breathing, or making time for fun activities.   Alcohol use  Do not drink alcohol if: ? Your health care provider tells you not to drink. ? You are pregnant, may be pregnant, or are planning to become pregnant.  If you drink alcohol: ? Limit how much you use to:  0-1 drink a day for women.  0-2 drinks a day for men. ? Be aware of how much alcohol is in your drink. In the [place] one drink equals one 12 oz bottle of beer (355 mL), one 5 oz glass of wine (148 mL), or one 1 oz glass of hard liquor (44 mL). Medicines Your health care provider may prescribe medicine if lifestyle changes are not enough to get your blood pressure under control and if:  Your systolic blood pressure is 130 or higher.  Your diastolic blood pressure is 80 or higher. Take medicines only as told by your health care provider. Follow the directions carefully. Blood pressure medicines must be taken as told by your health care provider. The medicine does not work as well when you skip doses. Skipping doses also puts you at risk for problems. Monitoring Before you monitor your blood pressure:  Do not smoke, drink caffeinated beverages, or exercise within 30 minutes before taking a measurement.  Use the bathroom and empty your bladder (urinate).  Sit quietly for at least 5 minutes before taking measurements. Monitor your blood pressure at home as told by your health care provider. To do this:  Sit with your back straight and supported.  Place your feet flat on the floor. Do not cross your legs.  Support your arm on a flat surface, such as a table. Make sure your upper arm is at heart level.  Each time you measure, take two or three readings one minute apart and record the results. You may also need to have your  blood pressure checked regularly by your health care provider.   General information  Talk with your health care provider about your diet, exercise habits, and other lifestyle factors that may be contributing to hypertension.  Review all the medicines you take with your health care provider because there may be side effects or interactions.  Keep all visits as told by your health care provider. Your health care provider can help you create and adjust your plan for managing your high blood pressure. Where to find more information  National Heart, Lung, and Blood Institute: www.nhlbi.nih.gov  American Heart Association: www.heart.org Contact a health care provider if:  You think you are having a reaction to medicines you have taken.  You have repeated (recurrent) headaches.  You feel dizzy.  You have swelling in your ankles.  You have trouble with your vision. Get help right away if:  You develop a severe headache or confusion.  You have unusual weakness or numbness, or you feel faint.  You have severe pain in your chest or abdomen.  You vomit repeatedly.  You have trouble breathing. These symptoms may represent a serious problem that is an emergency. Do not wait   to see if the symptoms will go away. Get medical help right away. Call your local emergency services (911 in the [place] Do not drive yourself to the hospital. Summary  Hypertension is when the force of blood pumping through your arteries is too strong. If this condition is not controlled, it may put you at risk for serious complications.  Your personal target blood pressure may vary depending on your medical conditions, your age, and other factors. For most people, a normal blood pressure is less than 120/80.  Hypertension is managed by lifestyle changes, medicines, or both.  Lifestyle changes to help manage hypertension include losing weight, eating a healthy, low-sodium diet, exercising more, stopping smoking, and  limiting alcohol. This information is not intended to replace advice given to you by your health care provider. Make sure you discuss any questions you have with your health care provider. Document Revised: 02/19/2019 Document Reviewed: 12/15/2018 Elsevier Patient Education  2021 Elsevier Inc.  

## 2020-06-09 ENCOUNTER — Ambulatory Visit: Payer: 59 | Admitting: Podiatry

## 2020-06-14 ENCOUNTER — Ambulatory Visit (INDEPENDENT_AMBULATORY_CARE_PROVIDER_SITE_OTHER): Payer: BC Managed Care – PPO | Admitting: Podiatry

## 2020-06-14 ENCOUNTER — Other Ambulatory Visit: Payer: Self-pay

## 2020-06-14 ENCOUNTER — Encounter: Payer: Self-pay | Admitting: Podiatry

## 2020-06-14 DIAGNOSIS — L600 Ingrowing nail: Secondary | ICD-10-CM

## 2020-06-14 DIAGNOSIS — B351 Tinea unguium: Secondary | ICD-10-CM | POA: Diagnosis not present

## 2020-06-14 DIAGNOSIS — M79675 Pain in left toe(s): Secondary | ICD-10-CM | POA: Diagnosis not present

## 2020-06-14 DIAGNOSIS — M79674 Pain in right toe(s): Secondary | ICD-10-CM | POA: Diagnosis not present

## 2020-06-14 NOTE — Progress Notes (Signed)
Subjective:   Patient ID: Randy Morgan, male   DOB: 55 y.o.   MRN: [id number]   HPI Patient presents with severely thickened dystrophic nailbeds 1-5 both feet that are sore and are making shoe gear difficult.  States that he has had these for years patient is obese which is complicating factor he cannot reach his toes does smoke some and is not significantly active   Review of Systems  All other systems reviewed and are negative.       Objective:  Physical Exam Vitals and nursing note reviewed.  Constitutional:      Appearance: He is well-developed.  Pulmonary:     Effort: Pulmonary effort is normal.  Musculoskeletal:        General: Normal range of motion.  Skin:    General: Skin is warm.  Neurological:     Mental Status: He is alert.     Neurovascular status found to be intact muscle strength was found to be adequate severe thickness of the nailbeds hallux bilateral and 2 through 5 bilateral with the hallux right being worse.  Patient has good digital perfusion well oriented x3     Assessment:  Combination of significant mycotic nail infection with thickness and discomfort with significant enlargement of the right big toenail     Plan:  H&P reviewed condition today deep debridement accomplished no iatrogenic bleeding and this can be done on a routine basis.  I advised this patient on nail removal educated him on what could be done explaining procedures that may be necessary in future.  Reappoint to recheck

## 2020-07-06 ENCOUNTER — Telehealth (INDEPENDENT_AMBULATORY_CARE_PROVIDER_SITE_OTHER): Payer: 59 | Admitting: Nurse Practitioner

## 2020-07-06 ENCOUNTER — Other Ambulatory Visit: Payer: Self-pay

## 2020-07-06 ENCOUNTER — Encounter: Payer: Self-pay | Admitting: Nurse Practitioner

## 2020-07-06 DIAGNOSIS — Z6841 Body Mass Index (BMI) 40.0 and over, adult: Secondary | ICD-10-CM

## 2020-07-06 DIAGNOSIS — E661 Drug-induced obesity: Secondary | ICD-10-CM

## 2020-07-06 DIAGNOSIS — F17209 Nicotine dependence, unspecified, with unspecified nicotine-induced disorders: Secondary | ICD-10-CM | POA: Diagnosis not present

## 2020-07-06 DIAGNOSIS — G4733 Obstructive sleep apnea (adult) (pediatric): Secondary | ICD-10-CM | POA: Diagnosis not present

## 2020-07-06 DIAGNOSIS — I10 Essential (primary) hypertension: Secondary | ICD-10-CM

## 2020-07-06 NOTE — Patient Instructions (Signed)
Managing Your Hypertension Hypertension, also called high blood pressure, is when the force of the blood pressing against the walls of the arteries is too strong. Arteries are blood vessels that carry blood from your heart throughout your body. Hypertension forces the heart to work harder to pump blood and may cause the arteries to become narrow or stiff. Understanding blood pressure readings Your personal target blood pressure may vary depending on your medical conditions, your age, and other factors. A blood pressure reading includes a higher number over a lower number. Ideally, your blood pressure should be below 120/80. You should know that:  The first, or top, number is called the systolic pressure. It is a measure of the pressure in your arteries as your heart beats.  The second, or bottom number, is called the diastolic pressure. It is a measure of the pressure in your arteries as the heart relaxes. Blood pressure is classified into four stages. Based on your blood pressure reading, your health care provider may use the following stages to determine what type of treatment you need, if any. Systolic pressure and diastolic pressure are measured in a unit called mmHg. Normal  Systolic pressure: below 120.  Diastolic pressure: below 80. Elevated  Systolic pressure: 120-129.  Diastolic pressure: below 80. Hypertension stage 1  Systolic pressure: 130-139.  Diastolic pressure: 80-89. Hypertension stage 2  Systolic pressure: 140 or above.  Diastolic pressure: 90 or above. How can this condition affect me? Managing your hypertension is an important responsibility. Over time, hypertension can damage the arteries and decrease blood flow to important parts of the body, including the brain, heart, and kidneys. Having untreated or uncontrolled hypertension can lead to:  A heart attack.  A stroke.  A weakened blood vessel (aneurysm).  Heart failure.  Kidney damage.  Eye  damage.  Metabolic syndrome.  Memory and concentration problems.  Vascular dementia. What actions can I take to manage this condition? Hypertension can be managed by making lifestyle changes and possibly by taking medicines. Your health care provider will help you make a plan to bring your blood pressure within a normal range. Nutrition  Eat a diet that is high in fiber and potassium, and low in salt (sodium), added sugar, and fat. An example eating plan is called the Dietary Approaches to Stop Hypertension (DASH) diet. To eat this way: ? Eat plenty of fresh fruits and vegetables. Try to fill one-half of your plate at each meal with fruits and vegetables. ? Eat whole grains, such as whole-wheat pasta, brown rice, or whole-grain bread. Fill about one-fourth of your plate with whole grains. ? Eat low-fat dairy products. ? Avoid fatty cuts of meat, processed or cured meats, and poultry with skin. Fill about one-fourth of your plate with lean proteins such as fish, chicken without skin, beans, eggs, and tofu. ? Avoid pre-made and processed foods. These tend to be higher in sodium, added sugar, and fat.  Reduce your daily sodium intake. Most people with hypertension should eat less than 1,500 mg of sodium a day.   Lifestyle  Work with your health care provider to maintain a healthy body weight or to lose weight. Ask what an ideal weight is for you.  Get at least 30 minutes of exercise that causes your heart to beat faster (aerobic exercise) most days of the week. Activities may include walking, swimming, or biking.  Include exercise to strengthen your muscles (resistance exercise), such as weight lifting, as part of your weekly exercise routine. Try   to do these types of exercises for 30 minutes at least 3 days a week.  Do not use any products that contain nicotine or tobacco, such as cigarettes, e-cigarettes, and chewing tobacco. If you need help quitting, ask your health care  provider.  Control any long-term (chronic) conditions you have, such as high cholesterol or diabetes.  Identify your sources of stress and find ways to manage stress. This may include meditation, deep breathing, or making time for fun activities.   Alcohol use  Do not drink alcohol if: ? Your health care provider tells you not to drink. ? You are pregnant, may be pregnant, or are planning to become pregnant.  If you drink alcohol: ? Limit how much you use to:  0-1 drink a day for women.  0-2 drinks a day for men. ? Be aware of how much alcohol is in your drink. In the [place] one drink equals one 12 oz bottle of beer (355 mL), one 5 oz glass of wine (148 mL), or one 1 oz glass of hard liquor (44 mL). Medicines Your health care provider may prescribe medicine if lifestyle changes are not enough to get your blood pressure under control and if:  Your systolic blood pressure is 130 or higher.  Your diastolic blood pressure is 80 or higher. Take medicines only as told by your health care provider. Follow the directions carefully. Blood pressure medicines must be taken as told by your health care provider. The medicine does not work as well when you skip doses. Skipping doses also puts you at risk for problems. Monitoring Before you monitor your blood pressure:  Do not smoke, drink caffeinated beverages, or exercise within 30 minutes before taking a measurement.  Use the bathroom and empty your bladder (urinate).  Sit quietly for at least 5 minutes before taking measurements. Monitor your blood pressure at home as told by your health care provider. To do this:  Sit with your back straight and supported.  Place your feet flat on the floor. Do not cross your legs.  Support your arm on a flat surface, such as a table. Make sure your upper arm is at heart level.  Each time you measure, take two or three readings one minute apart and record the results. You may also need to have your  blood pressure checked regularly by your health care provider.   General information  Talk with your health care provider about your diet, exercise habits, and other lifestyle factors that may be contributing to hypertension.  Review all the medicines you take with your health care provider because there may be side effects or interactions.  Keep all visits as told by your health care provider. Your health care provider can help you create and adjust your plan for managing your high blood pressure. Where to find more information  National Heart, Lung, and Blood Institute: www.nhlbi.nih.gov  American Heart Association: www.heart.org Contact a health care provider if:  You think you are having a reaction to medicines you have taken.  You have repeated (recurrent) headaches.  You feel dizzy.  You have swelling in your ankles.  You have trouble with your vision. Get help right away if:  You develop a severe headache or confusion.  You have unusual weakness or numbness, or you feel faint.  You have severe pain in your chest or abdomen.  You vomit repeatedly.  You have trouble breathing. These symptoms may represent a serious problem that is an emergency. Do not wait   to see if the symptoms will go away. Get medical help right away. Call your local emergency services (911 in the [place] Do not drive yourself to the hospital. Summary  Hypertension is when the force of blood pumping through your arteries is too strong. If this condition is not controlled, it may put you at risk for serious complications.  Your personal target blood pressure may vary depending on your medical conditions, your age, and other factors. For most people, a normal blood pressure is less than 120/80.  Hypertension is managed by lifestyle changes, medicines, or both.  Lifestyle changes to help manage hypertension include losing weight, eating a healthy, low-sodium diet, exercising more, stopping smoking, and  limiting alcohol. This information is not intended to replace advice given to you by your health care provider. Make sure you discuss any questions you have with your health care provider. Document Revised: 02/19/2019 Document Reviewed: 12/15/2018 Elsevier Patient Education  2021 Elsevier Inc.  

## 2020-07-06 NOTE — Progress Notes (Signed)
   [place], [place]  [address] Phone:  [phone]   Fax:  [phone] Virtual Visit via Video Note  I connected with [name] on 07/06/20 at  8:20 AM EDT by video and verified that I am speaking with the correct person using two identifiers.   I discussed the limitations, risks, security and privacy concerns of performing an evaluation and management service by tvideo and the availability of in person appointments. I also discussed with the patient that there may be a patient responsible charge related to this service. The patient expressed understanding and agreed to proceed.  Patient home Provider Office  History of Present Illness:  Hypertension Patient is here for follow-up of elevated blood pressure. He is not exercising and  adherent to a low-salt diet. He has eaten some barbecue Blood pressure is well controlled at home. Cardiac symptoms: none. 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 Patient denies chest pain, dyspnea, near-syncope, orthopnea, palpitations, syncope, and tachypnea. Cardiovascular risk factors: advanced age (older than 9 for men, 74 for women), hypertension, male gender, obesity (BMI >= 30 kg/m2), sedentary lifestyle, and smoking/ tobacco exposure.is not Use of agents associated with hypertension: none. History of target organ damage: heart failure.   Observations/Objective:   Assessment and Plan: Assessment  Primary Diagnosis & Pertinent Problem List: The primary encounter diagnosis was Essential hypertension, benign. Diagnoses of Class 3 drug-induced obesity with serious comorbidity and body mass index (BMI) of 50.0 to 59.9 in adult ([place]), OSA (obstructive sleep apnea), Tobacco use disorder, continuous, and BMI 50.0-59.9, adult ([place]) were also pertinent to this visit.  Visit Diagnosis: 1. Essential hypertension, benign  Encouraged on going compliance with current medication  regimen Encouraged home monitoring and recording BP <130/80 Eating a heart-healthy diet with less salt Encouraged regular physical activity  Recommend Weight loss  2. Class 3 drug-induced obesity with serious comorbidity and body mass index (BMI) of 50.0 to 59.9 in adult [facility])  Persistent   3. OSA (obstructive sleep apnea)  Worsening needs CPAP reports change insurance will resubmit when insurance in updated  4. Tobacco use disorder, continuous  Persistent working on smoking cessation  5. BMI 50.0-59.9, adult ([place])       Follow Up Instructions: 6 weeks   I discussed the assessment and treatment plan with the patient. The patient was provided an opportunity to ask questions and all were answered. The patient agreed with the plan and demonstrated an understanding of the instructions.   The patient was advised to call back or seek an in-person evaluation if the symptoms worsen or if the condition fails to improve as anticipated.  I provided 8 minutes of video- visit time during this encounter.   Vevelyn Francois, NP

## 2020-07-07 ENCOUNTER — Telehealth: Payer: Self-pay

## 2020-07-07 NOTE — Telephone Encounter (Signed)
Left mess for pt to c/b and make 6 week f/u w/ Randy Morgan

## 2020-07-13 ENCOUNTER — Other Ambulatory Visit: Payer: Self-pay | Admitting: Nurse Practitioner

## 2020-07-13 DIAGNOSIS — M5441 Lumbago with sciatica, right side: Secondary | ICD-10-CM

## 2020-07-13 DIAGNOSIS — G4733 Obstructive sleep apnea (adult) (pediatric): Secondary | ICD-10-CM

## 2020-07-21 ENCOUNTER — Other Ambulatory Visit: Payer: Self-pay

## 2020-07-21 ENCOUNTER — Encounter: Payer: Self-pay | Admitting: Nurse Practitioner

## 2020-07-21 ENCOUNTER — Ambulatory Visit (HOSPITAL_COMMUNITY)
Admission: RE | Admit: 2020-07-21 | Discharge: 2020-07-21 | Disposition: A | Discharge status: Home / Self Care | Payer: BC Managed Care – PPO | Source: Ambulatory Visit | Attending: Nurse Practitioner | Admitting: Nurse Practitioner

## 2020-07-21 DIAGNOSIS — M5442 Lumbago with sciatica, left side: Secondary | ICD-10-CM | POA: Diagnosis present

## 2020-07-21 DIAGNOSIS — M5441 Lumbago with sciatica, right side: Secondary | ICD-10-CM | POA: Diagnosis present

## 2020-07-21 IMAGING — DX DG LUMBAR SPINE COMPLETE 4+V
5 series · 5 of 5 positions shown · non-contrast
Comparison: None.

CLINICAL DATA: 55-year-old male with back pain.

EXAM:
LUMBAR SPINE - COMPLETE 4+ VIEW

[l-spine ap]
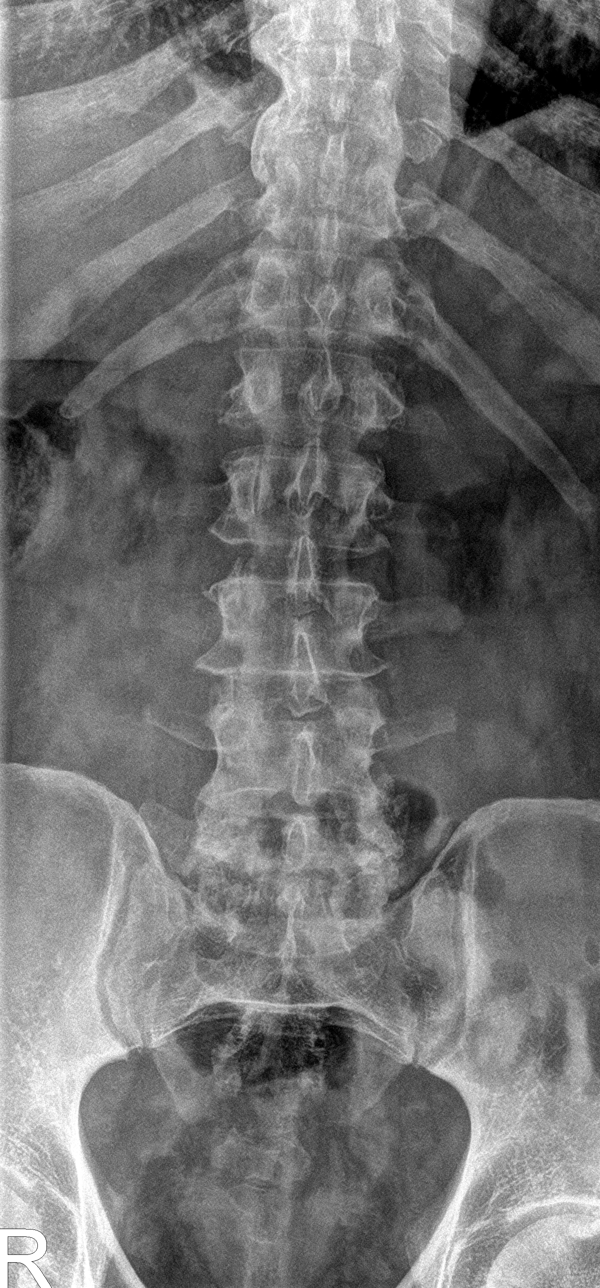

[l-spine obl (1 of 2)]
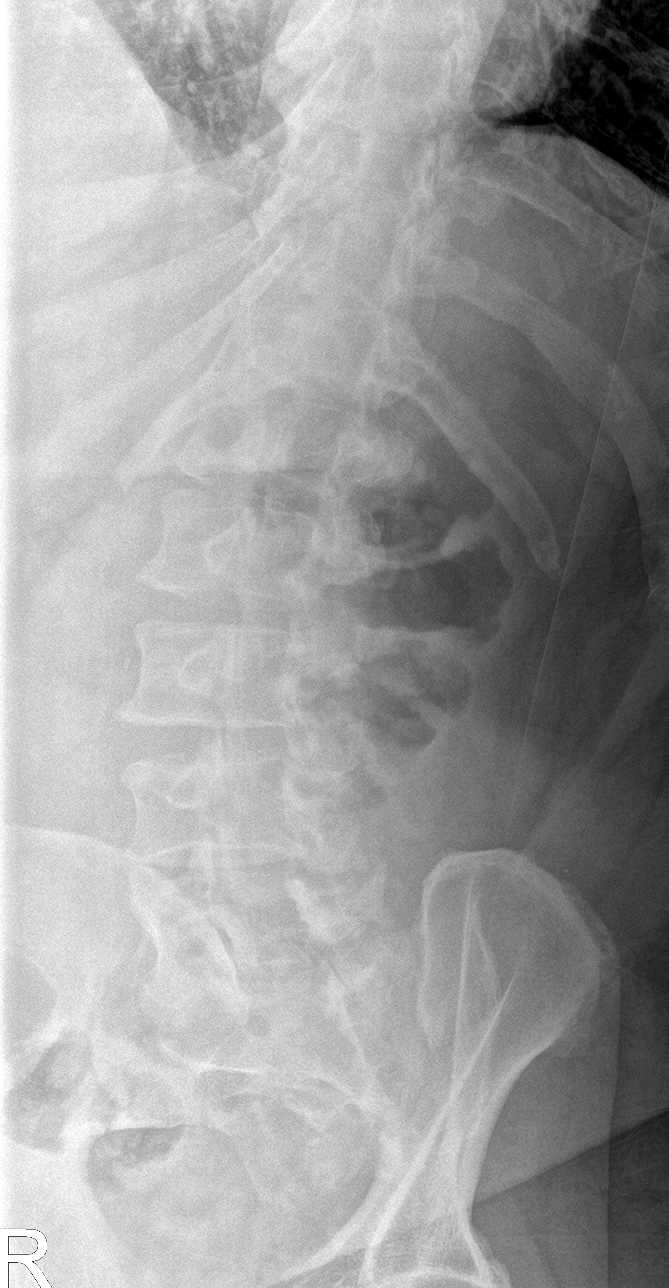

[l-spine obl (2 of 2)]
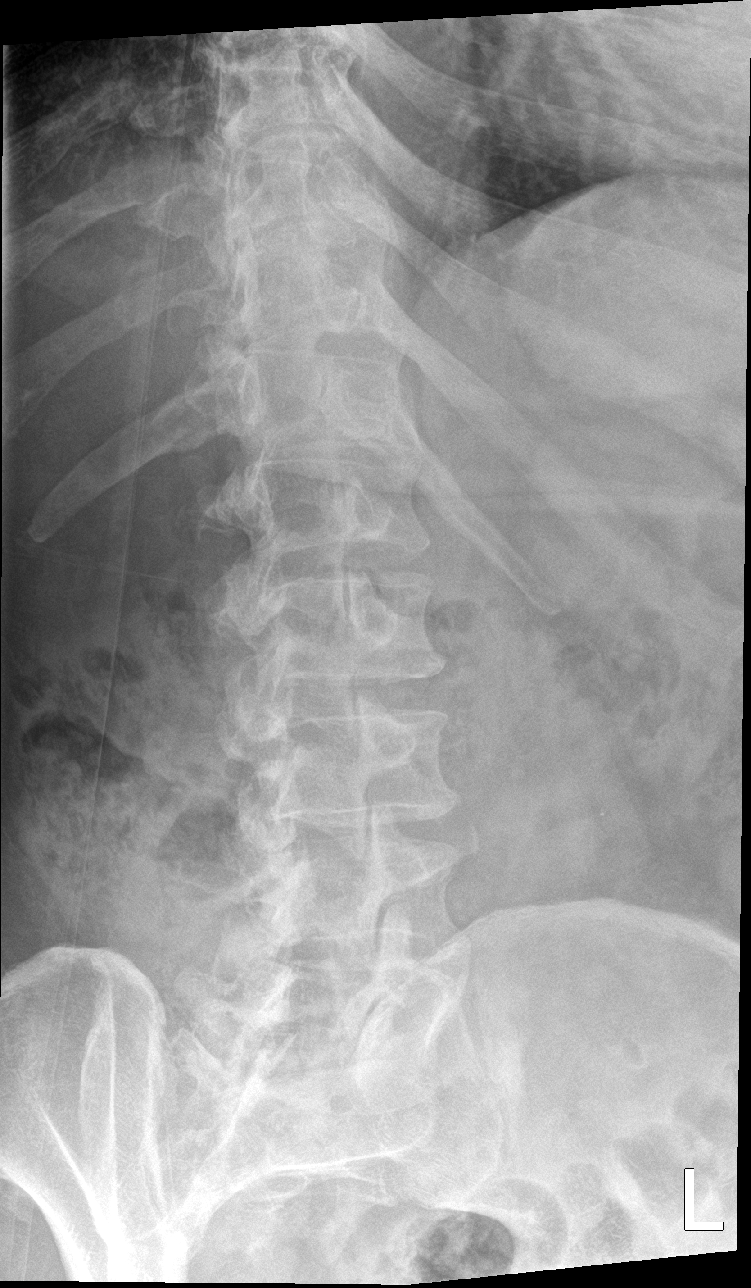

[l-spine spot]
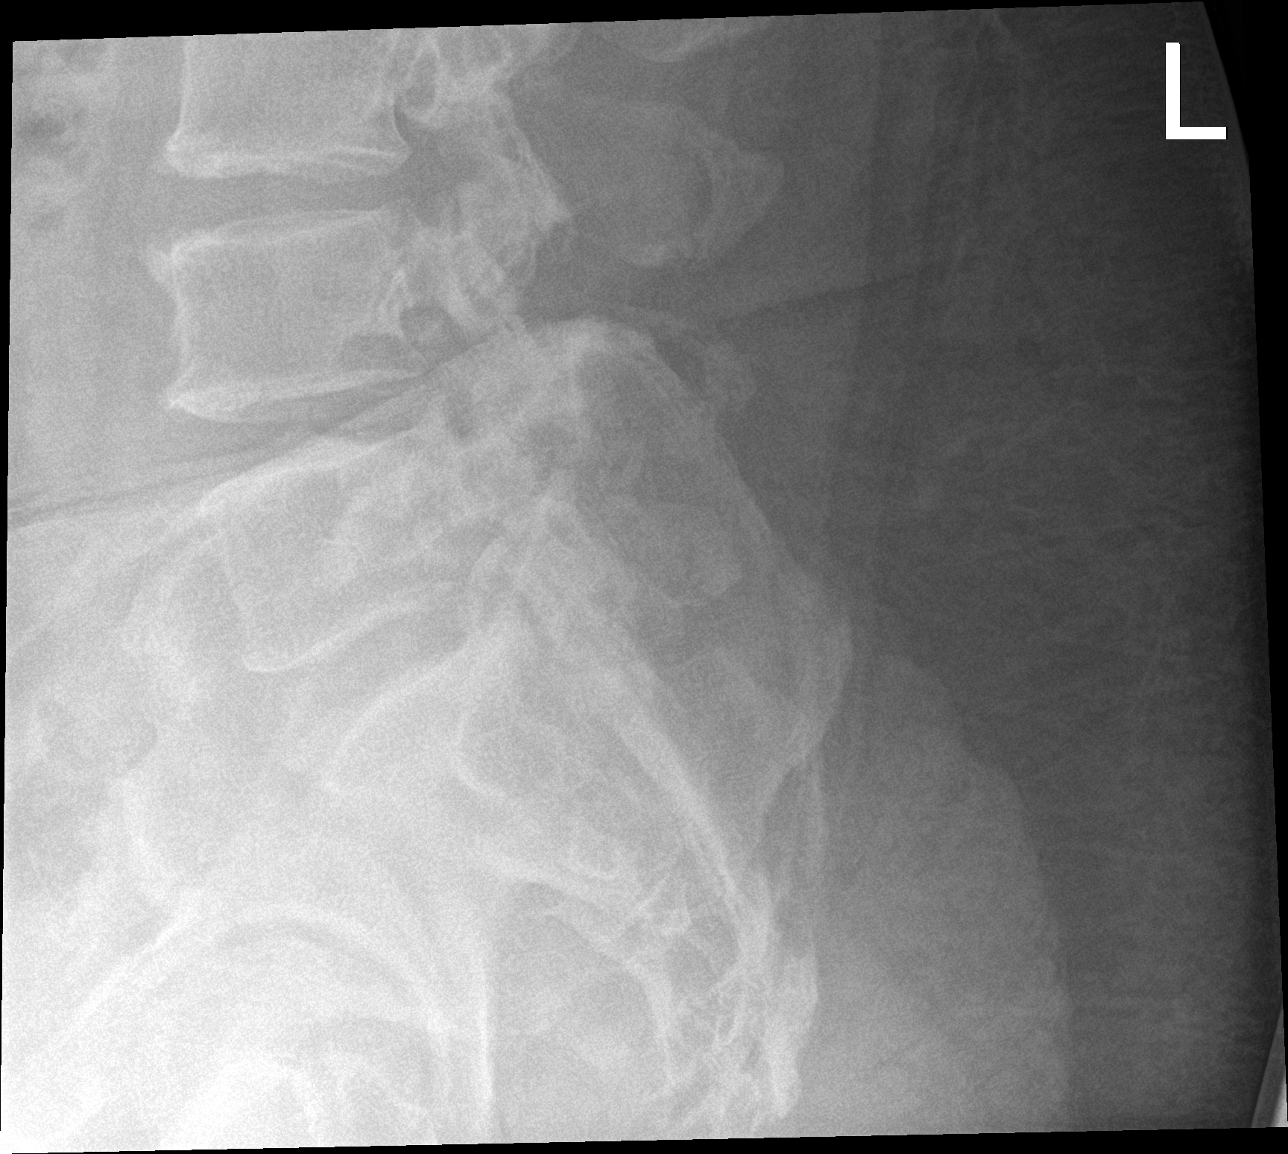

[l-spine lat]
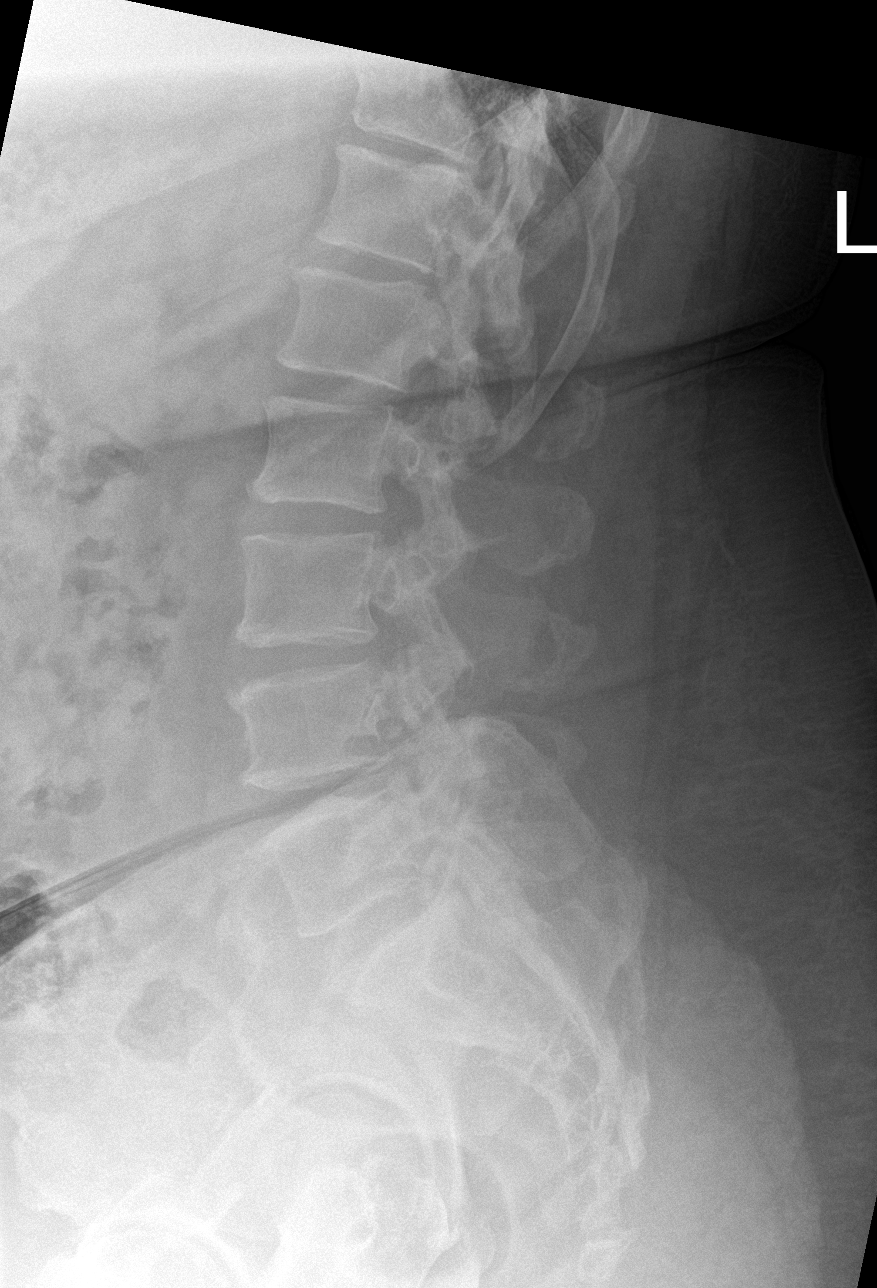

[5 of 5 positions shown; findings below may reference images not displayed]

FINDINGS: Five lumbar type vertebra. There is no acute fracture or subluxation
of the lumbar spine. Mild multilevel degenerative changes with mild
disc space narrowing and spurring primarily at L3-L4 and L4-L5. Mild
lower lumbar facet arthropathy. The visualized posterior elements
appear intact. The soft tissues are unremarkable.
IMPRESSION: 1. No acute/traumatic lumbar spine pathology.
2. Mild degenerative changes.

## 2020-09-13 DIAGNOSIS — G4730 Sleep apnea, unspecified: Secondary | ICD-10-CM | POA: Insufficient documentation

## 2020-09-13 DIAGNOSIS — R072 Precordial pain: Secondary | ICD-10-CM | POA: Insufficient documentation

## 2020-09-13 NOTE — Progress Notes (Deleted)
Cardiology Office Note   Date:  09/13/2020   ID:  Randy Morgan, DOB 1965-07-10, MRN [id number]  PCP:  Vevelyn Francois, NP  Cardiologist:   None Referring:  ***  No chief complaint on file.     History of Present Illness: Randy Morgan is a 55 y.o. male who is referred by *** for evaluation of chest pain.  ***   Past Medical History:  Diagnosis Date   Asthma    Hypertension    Sleep apnea    hasn't started CPAP yet    Past Surgical History:  Procedure Laterality Date   NO PAST SURGERIES     VIDEO BRONCHOSCOPY Bilateral 12/09/2017   Procedure: VIDEO BRONCHOSCOPY WITHOUT FLUORO;  Surgeon: Laurin Coder, MD;  Location: [facility];  Service: Cardiopulmonary;  Laterality: Bilateral;     No current outpatient medications on file.   No current facility-administered medications for this visit.    Allergies:   Patient has no known allergies.    Social History:  The patient  reports that he has been smoking cigarettes. He has a 12.50 pack-year smoking history. He has never used smokeless tobacco. He reports that he does not currently use alcohol after a past usage of about 2.0 standard drinks per week. He reports that he does not use drugs.   Family History:  The patient's ***family history includes Asthma in his mother and sister; Diabetes in his mother; Heart attack in his brother; Heart disease in his brother, father, and mother.    ROS:  Please see the history of present illness.   Otherwise, review of systems are positive for {NONE DEFAULTED:18576}.   All other systems are reviewed and negative.    PHYSICAL EXAM: VS:  There were no vitals taken for this visit. , BMI There is no height or weight on file to calculate BMI. GENERAL:  Well appearing HEENT:  Pupils equal round and reactive, fundi not visualized, oral mucosa unremarkable NECK:  No jugular venous distention, waveform within normal limits, carotid upstroke brisk and symmetric, no bruits, no  thyromegaly LYMPHATICS:  No cervical, inguinal adenopathy LUNGS:  Clear to auscultation bilaterally BACK:  No CVA tenderness CHEST:  Unremarkable HEART:  PMI not displaced or sustained,S1 and S2 within normal limits, no S3, no S4, no clicks, no rubs, *** murmurs ABD:  Flat, positive bowel sounds normal in frequency in pitch, no bruits, no rebound, no guarding, no midline pulsatile mass, no hepatomegaly, no splenomegaly EXT:  2 plus pulses throughout, no edema, no cyanosis no clubbing SKIN:  No rashes no nodules NEURO:  Cranial nerves II through XII grossly intact, motor grossly intact throughout PSYCH:  Cognitively intact, oriented to person place and time    EKG:  EKG {ACTION; IS/IS [id number] ordered today. The ekg ordered today demonstrates ***   Recent Labs: 03/02/2020: BUN 15; Creatinine, Ser 1.18; Potassium 4.1; Sodium 143    Lipid Panel    Component Value Date/Time   CHOL 220 (H) 03/02/2020 1034   TRIG 166 (H) 03/02/2020 1034   HDL 36 (L) 03/02/2020 1034   CHOLHDL 6.1 (H) 03/02/2020 1034   LDLCALC 154 (H) 03/02/2020 1034      Wt Readings from Last 3 Encounters:  05/29/20 (!) 312 lb 12.8 oz (141.9 kg)  03/29/20 (!) 320 lb (145.2 kg)  03/02/20 (!) 318 lb (144.2 kg)      Other studies Reviewed: Additional studies/ records that were reviewed today include: ***. Review of the above records demonstrates:  Please see elsewhere in the note.  ***   ASSESSMENT AND PLAN:  Chest pain:  ***  HTN:  ***  Sleep apnea:  ***   Current medicines are reviewed at length with the patient today.  The patient {ACTIONS; HAS/DOES NOT HAVE:19233} concerns regarding medicines.  The following changes have been made:  {PLAN; NO CHANGE:13088:s}  Labs/ tests ordered today include: *** No orders of the defined types were placed in this encounter.    Disposition:   FU with ***    Signed, Minus Breeding, MD  09/13/2020 9:18 PM    [facility]

## 2020-09-14 ENCOUNTER — Ambulatory Visit: Payer: 59 | Admitting: Cardiology

## 2020-09-14 DIAGNOSIS — G4730 Sleep apnea, unspecified: Secondary | ICD-10-CM

## 2020-09-14 DIAGNOSIS — R072 Precordial pain: Secondary | ICD-10-CM

## 2020-09-14 DIAGNOSIS — I10 Essential (primary) hypertension: Secondary | ICD-10-CM

## 2021-01-30 ENCOUNTER — Ambulatory Visit (INDEPENDENT_AMBULATORY_CARE_PROVIDER_SITE_OTHER)
Admission: EM | Admit: 2021-01-30 | Discharge: 2021-01-30 | Disposition: A | Discharge status: Other Acute Inpatient Hospital | Payer: 59 | Source: Home / Self Care | Attending: Emergency Medicine | Admitting: Emergency Medicine

## 2021-01-30 ENCOUNTER — Encounter (HOSPITAL_BASED_OUTPATIENT_CLINIC_OR_DEPARTMENT_OTHER): Payer: Self-pay | Admitting: Emergency Medicine

## 2021-01-30 ENCOUNTER — Other Ambulatory Visit: Payer: Self-pay

## 2021-01-30 ENCOUNTER — Emergency Department (HOSPITAL_BASED_OUTPATIENT_CLINIC_OR_DEPARTMENT_OTHER): Payer: 59

## 2021-01-30 ENCOUNTER — Inpatient Hospital Stay (HOSPITAL_BASED_OUTPATIENT_CLINIC_OR_DEPARTMENT_OTHER)
Admission: EM | Admit: 2021-01-30 | Discharge: 2021-02-04 | DRG: 177 | Disposition: A | Discharge status: Home / Self Care | Payer: 59 | Attending: Internal Medicine | Admitting: Internal Medicine

## 2021-01-30 ENCOUNTER — Encounter (HOSPITAL_COMMUNITY): Payer: Self-pay

## 2021-01-30 DIAGNOSIS — F17210 Nicotine dependence, cigarettes, uncomplicated: Secondary | ICD-10-CM | POA: Insufficient documentation

## 2021-01-30 DIAGNOSIS — I5031 Acute diastolic (congestive) heart failure: Secondary | ICD-10-CM | POA: Diagnosis present

## 2021-01-30 DIAGNOSIS — R051 Acute cough: Secondary | ICD-10-CM | POA: Insufficient documentation

## 2021-01-30 DIAGNOSIS — I3139 Other pericardial effusion (noninflammatory): Secondary | ICD-10-CM | POA: Diagnosis present

## 2021-01-30 DIAGNOSIS — Z6841 Body Mass Index (BMI) 40.0 and over, adult: Secondary | ICD-10-CM

## 2021-01-30 DIAGNOSIS — Z8249 Family history of ischemic heart disease and other diseases of the circulatory system: Secondary | ICD-10-CM

## 2021-01-30 DIAGNOSIS — I110 Hypertensive heart disease with heart failure: Secondary | ICD-10-CM | POA: Diagnosis present

## 2021-01-30 DIAGNOSIS — R0600 Dyspnea, unspecified: Secondary | ICD-10-CM | POA: Insufficient documentation

## 2021-01-30 DIAGNOSIS — J45909 Unspecified asthma, uncomplicated: Secondary | ICD-10-CM | POA: Insufficient documentation

## 2021-01-30 DIAGNOSIS — D1779 Benign lipomatous neoplasm of other sites: Secondary | ICD-10-CM | POA: Diagnosis present

## 2021-01-30 DIAGNOSIS — R0602 Shortness of breath: Secondary | ICD-10-CM

## 2021-01-30 DIAGNOSIS — Z9114 Patient's other noncompliance with medication regimen: Secondary | ICD-10-CM

## 2021-01-30 DIAGNOSIS — R7303 Prediabetes: Secondary | ICD-10-CM | POA: Diagnosis present

## 2021-01-30 DIAGNOSIS — I5022 Chronic systolic (congestive) heart failure: Secondary | ICD-10-CM | POA: Diagnosis present

## 2021-01-30 DIAGNOSIS — Z79899 Other long term (current) drug therapy: Secondary | ICD-10-CM

## 2021-01-30 DIAGNOSIS — J9601 Acute respiratory failure with hypoxia: Secondary | ICD-10-CM | POA: Diagnosis present

## 2021-01-30 DIAGNOSIS — U071 COVID-19: Secondary | ICD-10-CM | POA: Insufficient documentation

## 2021-01-30 DIAGNOSIS — R000 Tachycardia, unspecified: Secondary | ICD-10-CM | POA: Diagnosis present

## 2021-01-30 DIAGNOSIS — Z825 Family history of asthma and other chronic lower respiratory diseases: Secondary | ICD-10-CM

## 2021-01-30 DIAGNOSIS — I509 Heart failure, unspecified: Secondary | ICD-10-CM | POA: Diagnosis not present

## 2021-01-30 DIAGNOSIS — G4733 Obstructive sleep apnea (adult) (pediatric): Secondary | ICD-10-CM | POA: Diagnosis present

## 2021-01-30 DIAGNOSIS — M7989 Other specified soft tissue disorders: Secondary | ICD-10-CM | POA: Diagnosis present

## 2021-01-30 DIAGNOSIS — Z8616 Personal history of COVID-19: Secondary | ICD-10-CM

## 2021-01-30 DIAGNOSIS — Z833 Family history of diabetes mellitus: Secondary | ICD-10-CM

## 2021-01-30 DIAGNOSIS — G4730 Sleep apnea, unspecified: Secondary | ICD-10-CM | POA: Diagnosis present

## 2021-01-30 DIAGNOSIS — I10 Essential (primary) hypertension: Secondary | ICD-10-CM | POA: Diagnosis present

## 2021-01-30 DIAGNOSIS — I429 Cardiomyopathy, unspecified: Secondary | ICD-10-CM | POA: Diagnosis present

## 2021-01-30 DIAGNOSIS — I5043 Acute on chronic combined systolic (congestive) and diastolic (congestive) heart failure: Secondary | ICD-10-CM | POA: Diagnosis present

## 2021-01-30 DIAGNOSIS — N179 Acute kidney failure, unspecified: Secondary | ICD-10-CM | POA: Diagnosis present

## 2021-01-30 DIAGNOSIS — I5042 Chronic combined systolic (congestive) and diastolic (congestive) heart failure: Secondary | ICD-10-CM | POA: Diagnosis present

## 2021-01-30 HISTORY — DX: Heart failure, unspecified: I50.9

## 2021-01-30 LAB — CBC
HCT: 44.3 % (ref 39.0–52.0)
Hemoglobin: 14.8 g/dL (ref 13.0–17.0)
MCH: 29.2 pg (ref 26.0–34.0)
MCHC: 33.4 g/dL (ref 30.0–36.0)
MCV: 87.5 fL (ref 80.0–100.0)
Platelets: 205 K/uL (ref 150–400)
RBC: 5.06 MIL/uL (ref 4.22–5.81)
RDW: 13.3 % (ref 11.5–15.5)
WBC: 5.7 K/uL (ref 4.0–10.5)
nRBC: 0 % (ref 0.0–0.2)

## 2021-01-30 LAB — POC INFLUENZA A AND B ANTIGEN (URGENT CARE ONLY)
INFLUENZA A ANTIGEN, POC: NEGATIVE
INFLUENZA B ANTIGEN, POC: NEGATIVE

## 2021-01-30 LAB — BASIC METABOLIC PANEL
Anion gap: 8 (ref 5–15)
BUN: 16 mg/dL (ref 6–20)
CO2: 34 mmol/L — ABNORMAL HIGH (ref 22–32)
Calcium: 9 mg/dL (ref 8.9–10.3)
Chloride: 97 mmol/L — ABNORMAL LOW (ref 98–111)
Creatinine, Ser: 1.6 mg/dL — ABNORMAL HIGH (ref 0.61–1.24)
GFR, Estimated: 51 mL/min — ABNORMAL LOW (ref >60)
Glucose, Bld: 164 mg/dL — ABNORMAL HIGH (ref 70–99)
Potassium: 3.5 mmol/L (ref 3.5–5.1)
Sodium: 139 mmol/L (ref 135–145)

## 2021-01-30 IMAGING — DX DG CHEST 1V PORT
1 series · 1 of 1 positions shown · non-contrast
Comparison: Chest x-ray [DATE].

CLINICAL DATA: Cough and shortness of breath.  Leg swelling.

EXAM:
PORTABLE CHEST 1 VIEW

[chest]
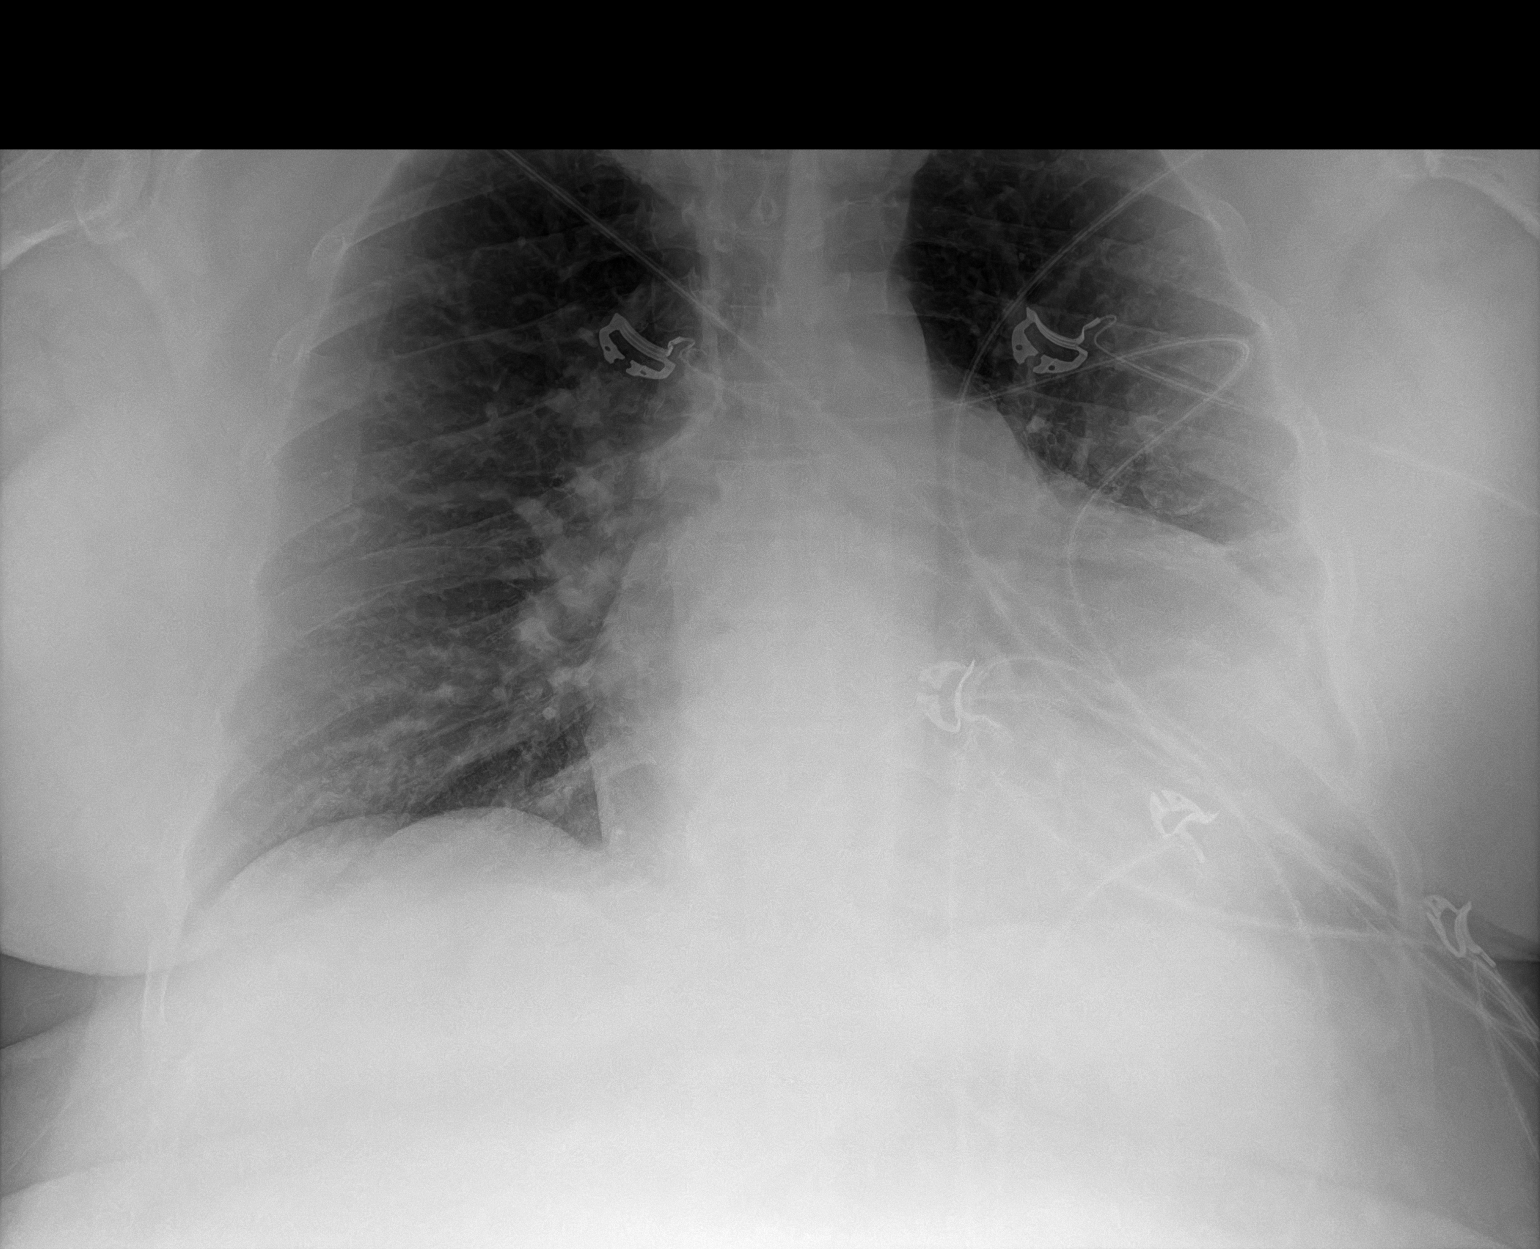

[1 of 1 positions shown; findings below may reference images not displayed]

FINDINGS: Cardiac silhouette is enlarged similar to the prior study. There is
no lung infiltrate, pleural effusion or pneumothorax identified. No
acute fractures are seen.
IMPRESSION: 1. No acute cardiopulmonary process.
2. Stable cardiomegaly.

## 2021-01-30 NOTE — ED Provider Notes (Signed)
HPI  SUBJECTIVE:  Randy Morgan is a 56 y.o. male who presents with 1 week of nonproductive cough, wheezing, sharp "sticking" right-sided chest pain present only after coughing, unintentional 10 pound weight gain, lower extremity edema, nocturia, 3 pillow orthopnea, dyspnea on exertion to 10 feet which is new and worsening over the past week. Symptoms started after eating a lot of salty holiday food.  He reports body aches, headache, sinus pain and pressure, postnasal drip starting today.  No sore throat, loss of sense of smell or taste, nausea, vomiting, diarrhea, abdominal pain. No known COVID or flu exposure.  He got the second dose of the COVID-vaccine.  He also got this years flu vaccine. He has a past medical history of hypertension, smoking, CHF, asthma.  No history of diabetes, coronary disease, MI.  PMD: Vevelyn Francois, NP   Past Medical History:  Diagnosis Date   Asthma    Hypertension    Sleep apnea    hasn't started CPAP yet    Past Surgical History:  Procedure Laterality Date   NO PAST SURGERIES     VIDEO BRONCHOSCOPY Bilateral 12/09/2017   Procedure: VIDEO BRONCHOSCOPY WITHOUT FLUORO;  Surgeon: Laurin Coder, MD;  Location: [facility];  Service: Cardiopulmonary;  Laterality: Bilateral;    Family History  Problem Relation Age of Onset   Heart disease Mother    Diabetes Mother    Asthma Mother    Heart disease Father    Asthma Sister    Heart disease Brother    Heart attack Brother    Colon cancer Neg Hx    Esophageal cancer Neg Hx    Rectal cancer Neg Hx    Stomach cancer Neg Hx     Social History   Tobacco Use   Smoking status: Some Days    Packs/day: 0.50    Years: 25.00    Pack years: 12.50    Types: Cigarettes   Smokeless tobacco: Never  Vaping Use   Vaping Use: Never used  Substance Use Topics   Alcohol use: Not Currently    Alcohol/week: 2.0 standard drinks    Types: 2 Cans of beer per week    Comment: occ beer   Drug use: No    No  current facility-administered medications for this encounter. No current outpatient medications on file.  No Known Allergies   ROS  As noted in HPI.   Physical Exam  BP (!) 151/71    Pulse (!) 105    Temp 99.5 F (37.5 C) (Oral)    Resp (!) 22    SpO2 95%   Constitutional: Well developed, well nourished, no acute distress.  Wet cough Eyes:  EOMI, conjunctiva normal bilaterally HENT: Normocephalic, atraumatic,mucus membranes moist Respiratory: Normal inspiratory effort, scattered wheezing, rhonchi at bases. Cardiovascular: Regular tachycardia, no murmurs, rubs, gallop GI: nondistended skin: No rash, skin intact Musculoskeletal: Bilateral l 2+ pitting edema lower extremity Neurologic: Alert & oriented x 3, no focal neuro deficits Psychiatric: Speech and behavior appropriate   ED Course   Medications - No data to display  Orders Placed This Encounter  Procedures   SARS CORONAVIRUS 2 (TAT 6-24 HRS) Nasopharyngeal Nasopharyngeal Swab    Standing Status:   Standing    Number of Occurrences:   1   Droplet precaution    Standing Status:   Standing    Number of Occurrences:   1   POC Influenza A & B Ag (Urgent Care)    Standing Status:  Standing    Number of Occurrences:   1    Results for orders placed or performed during the hospital encounter of 01/30/21 (from the past 24 hour(s))  POC Influenza A & B Ag (Urgent Care)     Status: None   Collection Time: 01/30/21  9:32 PM  Result Value Ref Range   INFLUENZA A ANTIGEN, POC NEGATIVE NEGATIVE   INFLUENZA B ANTIGEN, POC NEGATIVE NEGATIVE   No results found.  ED Clinical Impression  1. Acute cough   2. Dyspnea, unspecified type   3. Congestive heart failure, unspecified HF chronicity, unspecified heart failure type [facility])      ED Assessment/Plan  Concern for acute CHF due to recent increased salt intake from holiday foods.Marland Kitchen  He is flu testing is negative.  COVID is pending.  He will qualify for antivirals based on  comorbidities if his COVID is positive.  Will prescribe Molnupiravir in that case.  We do not have labs available here at this time.  Transferring to the emergency department for labs, possible diuresis and further evaluation to make sure that he is safe to go home.  He is stable to go by private vehicle.  Discussed rationale for transfer to the emergency department with the patient and spouse.  They agree to go.   No orders of the defined types were placed in this encounter.     *This clinic note was created using Dragon dictation software. Therefore, there may be occasional mistakes despite careful proofreading.  ?    Melynda Ripple, MD 01/30/21 2153

## 2021-01-30 NOTE — ED Triage Notes (Signed)
Pt presents with cough and generalized body aches today.

## 2021-01-30 NOTE — ED Notes (Signed)
Patient is being discharged from the Urgent Care and sent to the Emergency Department via personal vehicle with spouse . Per Dr Alphonzo Cruise, patient is in need of higher level of care due to potential CHF. Patient is aware and verbalizes understanding of plan of care.  Vitals:   01/30/21 2032  BP: (!) 151/71  Pulse: (!) 105  Resp: (!) 22  Temp: 99.5 F (37.5 C)  SpO2: 95%

## 2021-01-30 NOTE — ED Triage Notes (Signed)
Pt has been experiencing productively yellow cough , sob, leg swelling x 1 wk, now experiencing fever and HA. Having to sleep on 4 pillows, has not been weighing self. Has used inhaler some through week but not today.   H/o CHF, asthma

## 2021-01-30 NOTE — Discharge Instructions (Addendum)
Your flu is negative.  Your COVID is pending.  We will contact you and prescribe Molnupiravir if it is positive.  I am concerned that you are having acute episode of congestive heart failure.  Please go to the [place] emergency department for further evaluation to make sure that you are safe to go home.  Unfortunately, we do not have the appropriate labs available to make sure that you are safe.

## 2021-01-31 ENCOUNTER — Emergency Department (HOSPITAL_BASED_OUTPATIENT_CLINIC_OR_DEPARTMENT_OTHER): Payer: 59

## 2021-01-31 ENCOUNTER — Encounter (HOSPITAL_BASED_OUTPATIENT_CLINIC_OR_DEPARTMENT_OTHER): Payer: Self-pay | Admitting: Internal Medicine

## 2021-01-31 DIAGNOSIS — I3139 Other pericardial effusion (noninflammatory): Secondary | ICD-10-CM | POA: Diagnosis present

## 2021-01-31 DIAGNOSIS — D1779 Benign lipomatous neoplasm of other sites: Secondary | ICD-10-CM | POA: Diagnosis present

## 2021-01-31 DIAGNOSIS — M7989 Other specified soft tissue disorders: Secondary | ICD-10-CM | POA: Diagnosis present

## 2021-01-31 DIAGNOSIS — Z79899 Other long term (current) drug therapy: Secondary | ICD-10-CM | POA: Diagnosis not present

## 2021-01-31 DIAGNOSIS — J9601 Acute respiratory failure with hypoxia: Secondary | ICD-10-CM | POA: Diagnosis present

## 2021-01-31 DIAGNOSIS — I5031 Acute diastolic (congestive) heart failure: Secondary | ICD-10-CM | POA: Diagnosis present

## 2021-01-31 DIAGNOSIS — Z6841 Body Mass Index (BMI) 40.0 and over, adult: Secondary | ICD-10-CM | POA: Diagnosis not present

## 2021-01-31 DIAGNOSIS — I429 Cardiomyopathy, unspecified: Secondary | ICD-10-CM | POA: Diagnosis present

## 2021-01-31 DIAGNOSIS — Z833 Family history of diabetes mellitus: Secondary | ICD-10-CM | POA: Diagnosis not present

## 2021-01-31 DIAGNOSIS — Z9114 Patient's other noncompliance with medication regimen: Secondary | ICD-10-CM | POA: Diagnosis not present

## 2021-01-31 DIAGNOSIS — G4733 Obstructive sleep apnea (adult) (pediatric): Secondary | ICD-10-CM | POA: Diagnosis present

## 2021-01-31 DIAGNOSIS — N179 Acute kidney failure, unspecified: Secondary | ICD-10-CM | POA: Diagnosis present

## 2021-01-31 DIAGNOSIS — Z8616 Personal history of COVID-19: Secondary | ICD-10-CM | POA: Diagnosis not present

## 2021-01-31 DIAGNOSIS — I5041 Acute combined systolic (congestive) and diastolic (congestive) heart failure: Secondary | ICD-10-CM | POA: Diagnosis not present

## 2021-01-31 DIAGNOSIS — I5021 Acute systolic (congestive) heart failure: Secondary | ICD-10-CM | POA: Diagnosis not present

## 2021-01-31 DIAGNOSIS — R0602 Shortness of breath: Secondary | ICD-10-CM | POA: Diagnosis not present

## 2021-01-31 DIAGNOSIS — R7303 Prediabetes: Secondary | ICD-10-CM | POA: Diagnosis present

## 2021-01-31 DIAGNOSIS — U071 COVID-19: Secondary | ICD-10-CM | POA: Diagnosis present

## 2021-01-31 DIAGNOSIS — F17210 Nicotine dependence, cigarettes, uncomplicated: Secondary | ICD-10-CM | POA: Diagnosis present

## 2021-01-31 DIAGNOSIS — J45909 Unspecified asthma, uncomplicated: Secondary | ICD-10-CM | POA: Diagnosis present

## 2021-01-31 DIAGNOSIS — R000 Tachycardia, unspecified: Secondary | ICD-10-CM | POA: Diagnosis present

## 2021-01-31 DIAGNOSIS — I10 Essential (primary) hypertension: Secondary | ICD-10-CM | POA: Diagnosis not present

## 2021-01-31 DIAGNOSIS — I110 Hypertensive heart disease with heart failure: Secondary | ICD-10-CM | POA: Diagnosis present

## 2021-01-31 DIAGNOSIS — Z8249 Family history of ischemic heart disease and other diseases of the circulatory system: Secondary | ICD-10-CM | POA: Diagnosis not present

## 2021-01-31 DIAGNOSIS — I5043 Acute on chronic combined systolic (congestive) and diastolic (congestive) heart failure: Secondary | ICD-10-CM | POA: Diagnosis present

## 2021-01-31 DIAGNOSIS — Z825 Family history of asthma and other chronic lower respiratory diseases: Secondary | ICD-10-CM | POA: Diagnosis not present

## 2021-01-31 LAB — SARS CORONAVIRUS 2 (TAT 6-24 HRS): SARS Coronavirus 2: POSITIVE — AB

## 2021-01-31 LAB — RESP PANEL BY RT-PCR (FLU A&B, COVID) ARPGX2
Influenza A by PCR: NEGATIVE
Influenza B by PCR: NEGATIVE
SARS Coronavirus 2 by RT PCR: POSITIVE — AB

## 2021-01-31 LAB — TROPONIN I (HIGH SENSITIVITY)
Troponin I (High Sensitivity): 19 ng/L — ABNORMAL HIGH (ref <18)
Troponin I (High Sensitivity): 21 ng/L — ABNORMAL HIGH (ref <18)

## 2021-01-31 LAB — BRAIN NATRIURETIC PEPTIDE: B Natriuretic Peptide: 175.4 pg/mL — ABNORMAL HIGH (ref 0.0–100.0)

## 2021-01-31 IMAGING — CT CT ANGIO CHEST
2 of 7 series · 17 of 46 positions shown · IV contrast (omnipaque)
Comparison: [DATE]

CLINICAL DATA: Productive cough, dyspnea, leg swelling. Pulmonary
embolism (PE) suspected, high prob.

EXAM:
CT ANGIOGRAPHY CHEST WITH CONTRAST
TECHNIQUE: Multidetector CT imaging of the chest was performed using the
standard protocol during bolus administration of intravenous
contrast. Multiplanar CT image reconstructions and MIPs were
obtained to evaluate the vascular anatomy.
CONTRAST:  100mL OMNIPAQUE IOHEXOL 350 MG/ML SOLN

[Series 6: pe axial thins · axial · 0.77mm/px · z∈[+994,+1283]mm · 14 of 335 slices shown]
[im 23/335  lung]
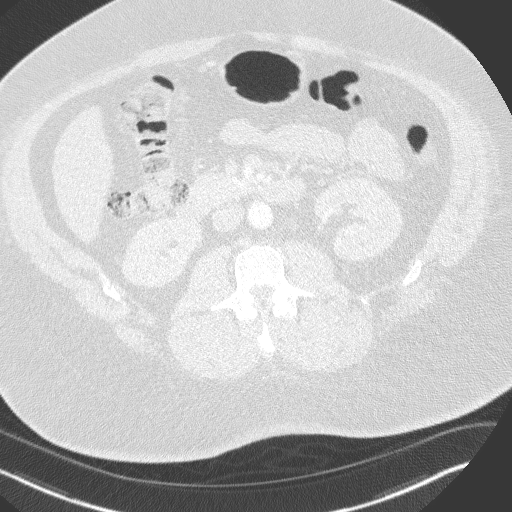
[im 45/335  soft-tissue]
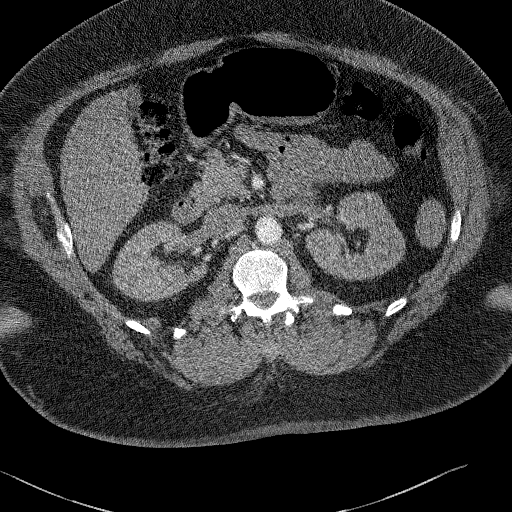
[im 67/335  lung]
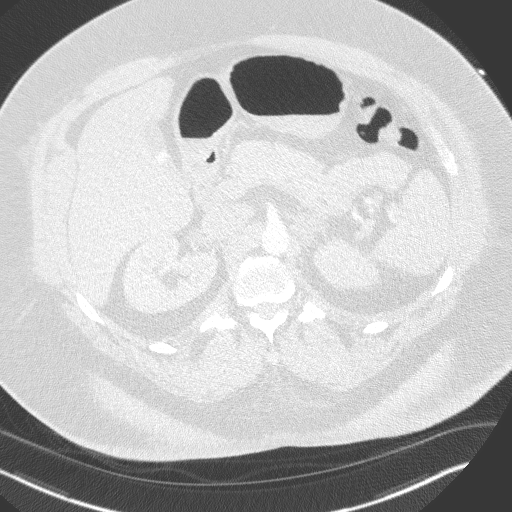
[im 90/335  soft-tissue]
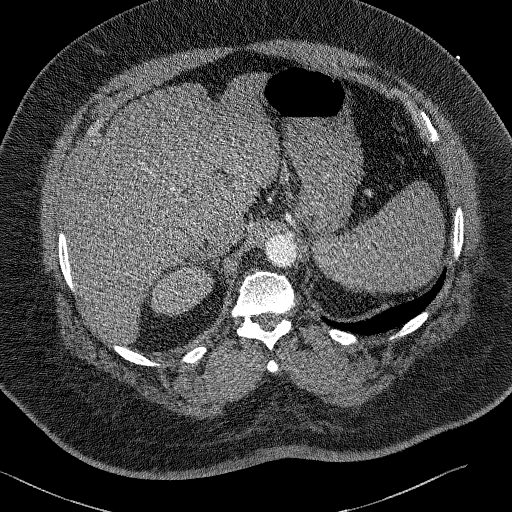
[im 112/335  lung]
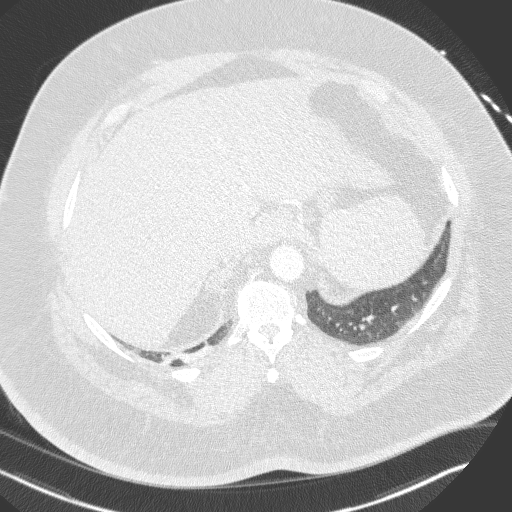
[im 134/335  soft-tissue]
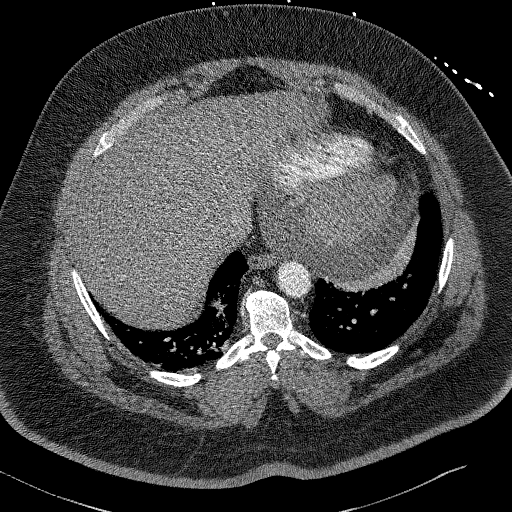
[im 156/335  lung]
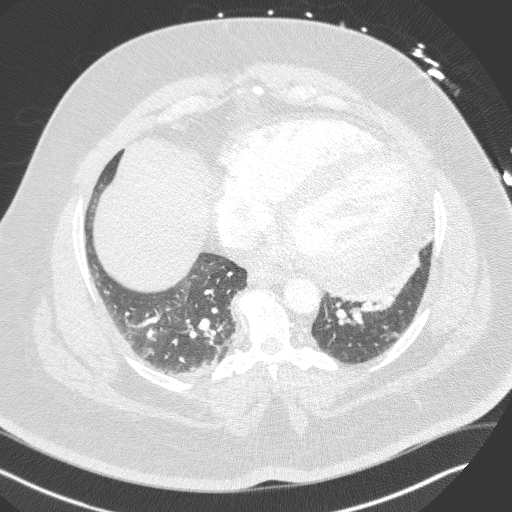
[im 179/335  soft-tissue]
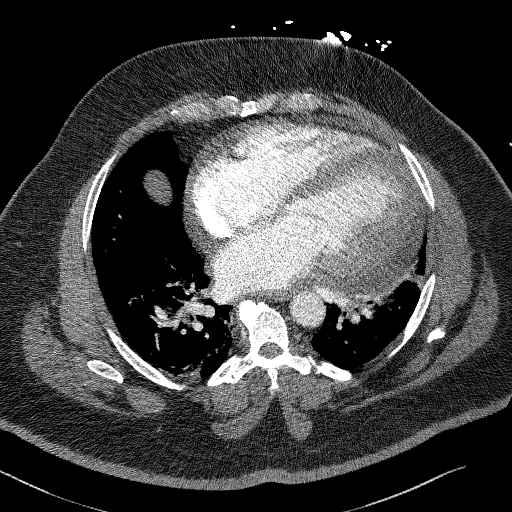
[im 201/335  lung]
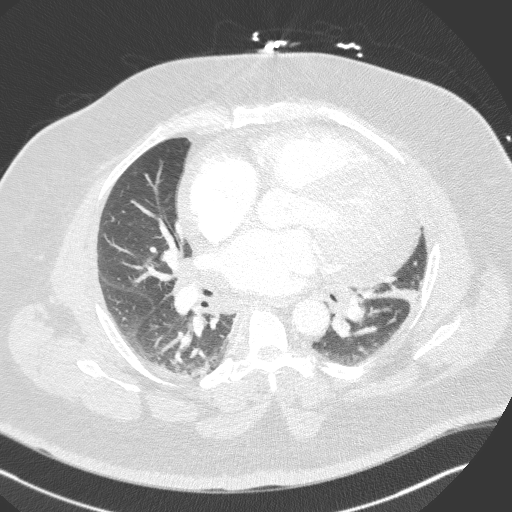
[im 223/335  soft-tissue]
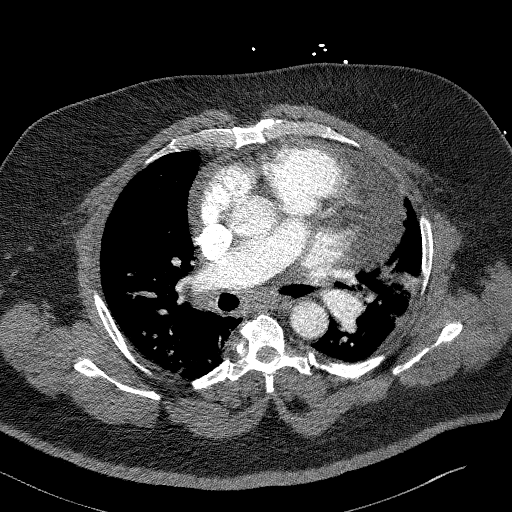
[im 245/335  lung]
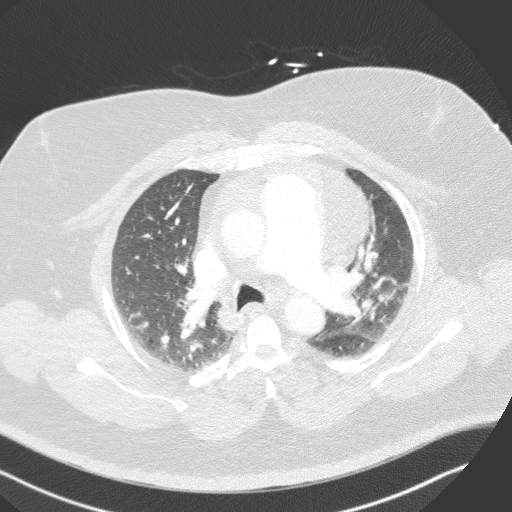
[im 268/335  soft-tissue]
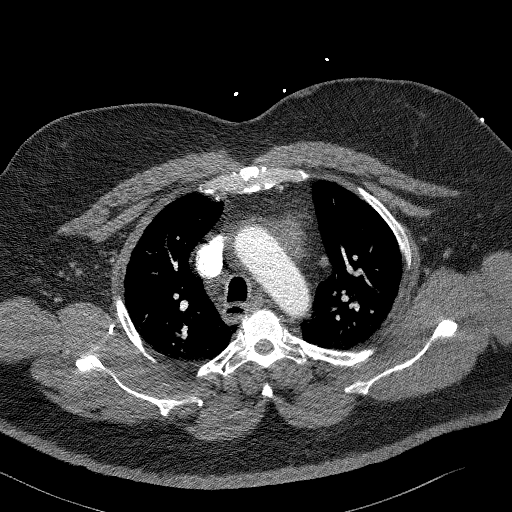
[im 290/335  lung]
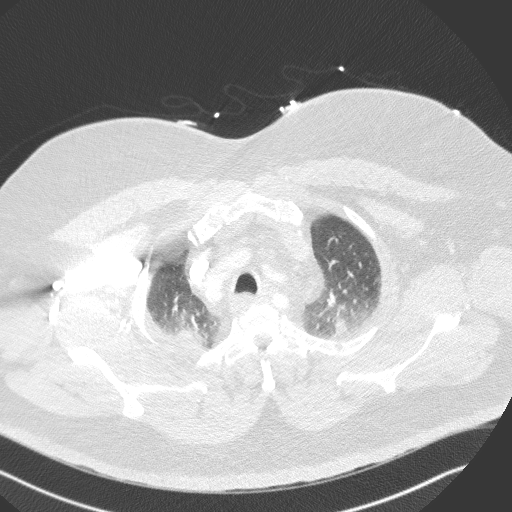
[im 312/335  soft-tissue]
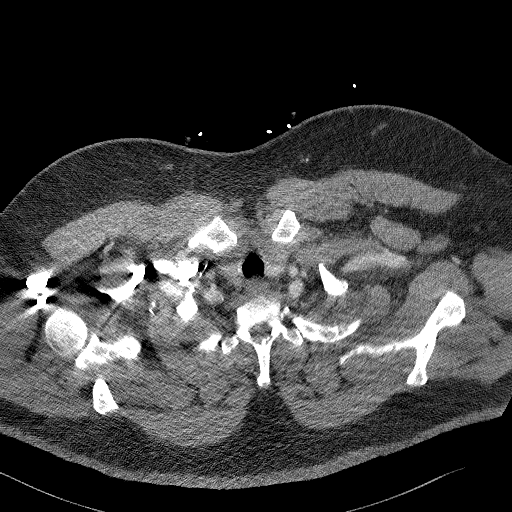

[Series 8: cor soft · coronal · 0.67mm/px · 3 of 200 slices shown]
[im 50/200  soft-tissue]
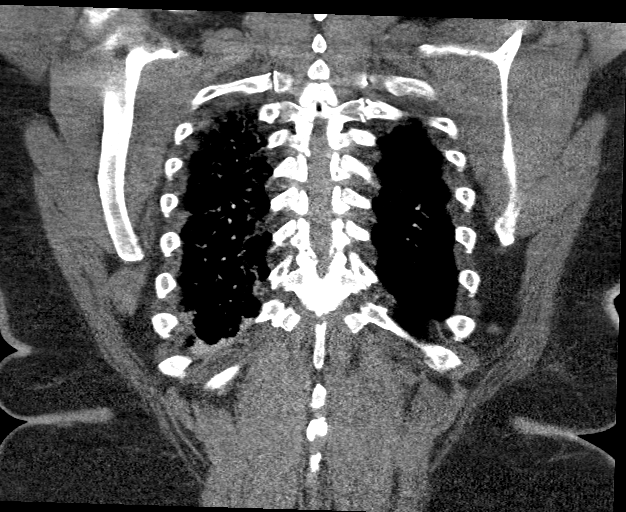
[im 100/200  soft-tissue]
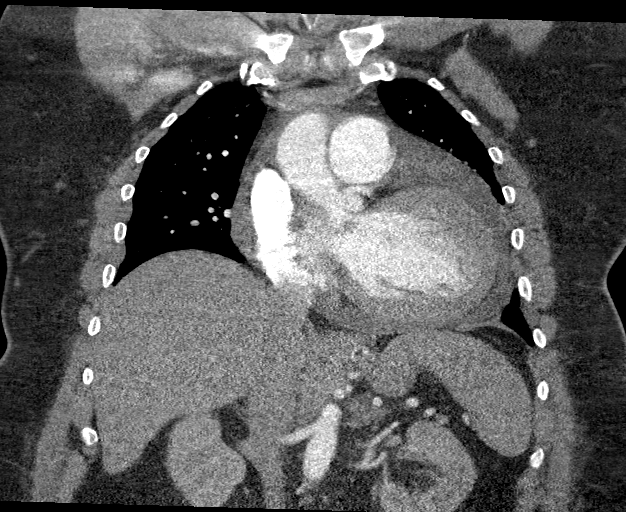
[im 150/200  soft-tissue]
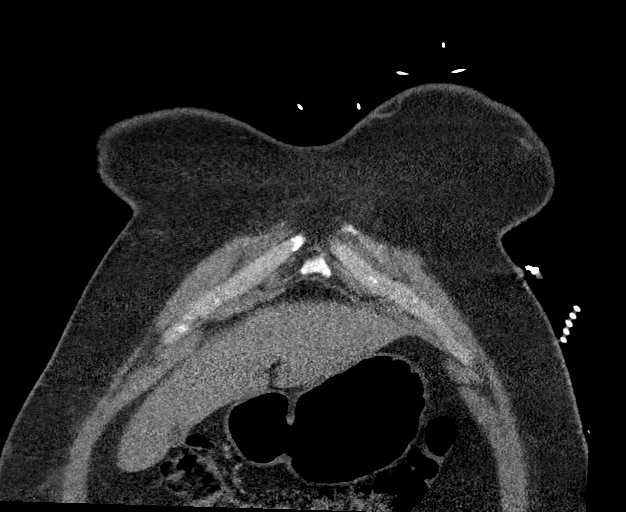

[17 of 46 positions shown; findings below may reference images not displayed]

FINDINGS: Cardiovascular: There is adequate opacification of the pulmonary
arterial tree through the segmental level. No intraluminal filling
defect identified to suggest acute pulmonary embolism. The central
pulmonary arteries are enlarged in keeping with changes of pulmonary
arterial hypertension.

Mild coronary artery calcification. Mild global cardiomegaly.
Moderate pericardial effusion has developed in the interval since
prior examination. No CT evidence of cardiac tamponade. The thoracic
aorta is unremarkable.

Mediastinum/Nodes: No enlarged mediastinal, hilar, or axillary lymph
nodes. Thyroid gland, trachea, and esophagus demonstrate no
significant findings.

Lungs/Pleura: There is central pulmonary vascular congestion.
Additionally, there is mild peribronchovascular soft tissue
thickening within the perihilar regions bilaterally likely
reflecting trace perihilar pulmonary edema. No confluent pulmonary
infiltrate. Minimal left basilar atelectasis. No pneumothorax or
pleural effusion. No central obstructing mass.

Upper Abdomen: Cholelithiasis.  No acute abnormality

Musculoskeletal: No acute bone abnormality. No lytic or blastic bone
lesion.

Review of the MIP images confirms the above findings.
IMPRESSION: No pulmonary embolism.

Interval development of a moderate pericardial effusion. No CT
evidence of cardiac tamponade.

Central pulmonary vascular congestion with trace perihilar pulmonary
edema in keeping with mild cardiogenic failure.

Mild coronary artery calcification. Mild global cardiomegaly.
Morphologic changes in keeping with pulmonary arterial hypertension.

Cholelithiasis

## 2021-01-31 MED ORDER — ALBUTEROL SULFATE HFA 108 (90 BASE) MCG/ACT IN AERS
INHALATION_SPRAY | RESPIRATORY_TRACT | Status: AC
  Filled 2021-01-31: qty 6.7

## 2021-01-31 MED ORDER — POTASSIUM CHLORIDE CRYS ER 20 MEQ PO TBCR
40.0000 meq | EXTENDED_RELEASE_TABLET | Freq: Once | ORAL | Status: AC
  Administered 2021-01-31: 40 meq via ORAL
  Filled 2021-01-31: qty 2

## 2021-01-31 MED ORDER — SODIUM CHLORIDE 0.9 % IV SOLN
100.0000 mg | Freq: Every day | INTRAVENOUS | Status: DC
  Administered 2021-02-01 – 2021-02-03 (×3): 100 mg via INTRAVENOUS
  Filled 2021-01-31 (×4): qty 20

## 2021-01-31 MED ORDER — HEPARIN SODIUM (PORCINE) 5000 UNIT/ML IJ SOLN
5000.0000 [IU] | Freq: Three times a day (TID) | INTRAMUSCULAR | Status: DC
  Administered 2021-01-31 – 2021-02-04 (×12): 5000 [IU] via SUBCUTANEOUS
  Filled 2021-01-31 (×12): qty 1

## 2021-01-31 MED ORDER — SODIUM CHLORIDE 0.9% FLUSH: 3.0000 mL | INTRAVENOUS | Status: DC | PRN

## 2021-01-31 MED ORDER — ALBUTEROL SULFATE HFA 108 (90 BASE) MCG/ACT IN AERS
2.0000 | INHALATION_SPRAY | RESPIRATORY_TRACT | Status: DC | PRN
  Administered 2021-01-31: 2 via RESPIRATORY_TRACT
  Filled 2021-01-31: qty 6.7

## 2021-01-31 MED ORDER — IOHEXOL 350 MG/ML SOLN
100.0000 mL | Freq: Once | INTRAVENOUS | Status: AC | PRN
  Administered 2021-01-31: 100 mL via INTRAVENOUS

## 2021-01-31 MED ORDER — SODIUM CHLORIDE 0.9 % IV SOLN
200.0000 mg | Freq: Once | INTRAVENOUS | Status: AC
  Administered 2021-01-31: 200 mg via INTRAVENOUS
  Filled 2021-01-31 (×2): qty 40

## 2021-01-31 MED ORDER — ACETAMINOPHEN 325 MG PO TABS: 650.0000 mg | ORAL_TABLET | ORAL | Status: DC | PRN

## 2021-01-31 MED ORDER — ACETAMINOPHEN 500 MG PO TABS
ORAL_TABLET | ORAL | Status: AC
  Filled 2021-01-31: qty 2

## 2021-01-31 MED ORDER — SODIUM CHLORIDE 0.9% FLUSH
3.0000 mL | Freq: Two times a day (BID) | INTRAVENOUS | Status: DC
  Administered 2021-01-31 – 2021-02-04 (×7): 3 mL via INTRAVENOUS

## 2021-01-31 MED ORDER — ONDANSETRON HCL 4 MG/2ML IJ SOLN: 4.0000 mg | Freq: Four times a day (QID) | INTRAMUSCULAR | Status: DC | PRN

## 2021-01-31 MED ORDER — SODIUM CHLORIDE 0.9 % IV SOLN: 250.0000 mL | INTRAVENOUS | Status: DC | PRN

## 2021-01-31 MED ORDER — DEXAMETHASONE SODIUM PHOSPHATE 10 MG/ML IJ SOLN
10.0000 mg | Freq: Once | INTRAMUSCULAR | Status: AC
Start: 2021-01-31 — End: 2021-01-31
  Administered 2021-01-31: 10 mg via INTRAVENOUS

## 2021-01-31 MED ORDER — ACETAMINOPHEN 500 MG PO TABS
1000.0000 mg | ORAL_TABLET | Freq: Once | ORAL | Status: AC
  Administered 2021-01-31: 1000 mg via ORAL

## 2021-01-31 MED ORDER — FUROSEMIDE 10 MG/ML IJ SOLN
60.0000 mg | Freq: Every day | INTRAMUSCULAR | Status: DC
  Administered 2021-01-31: 60 mg via INTRAVENOUS
  Filled 2021-01-31: qty 6

## 2021-01-31 MED ORDER — ALBUTEROL SULFATE (2.5 MG/3ML) 0.083% IN NEBU: 2.5000 mg | INHALATION_SOLUTION | RESPIRATORY_TRACT | Status: DC | PRN

## 2021-01-31 MED ORDER — DEXAMETHASONE SODIUM PHOSPHATE 10 MG/ML IJ SOLN
INTRAMUSCULAR | Status: AC
  Filled 2021-01-31: qty 1

## 2021-01-31 NOTE — ED Provider Notes (Signed)
[facility] EMERGENCY DEPT Provider Note   CSN: [id number] Arrival date & time: 01/30/21  2253     History  Chief Complaint  Patient presents with   Shortness of Breath    Randy Morgan is a 56 y.o. male.  Patient presents to the emergency department for evaluation of shortness of breath.  Patient reports that he has been short of breath and having a productive cough for 1 week.  Patient has noticed increased swelling of his legs.  He has been having trouble sleeping, has needed multiple pillows to prop himself up so he is not short of breath.  He does have a history of asthma.  No history of CHF.  Patient not experiencing chest pain.      Home Medications Prior to Admission medications   Not on File      Allergies    Patient has no known allergies.    Review of Systems   Review of Systems  Respiratory:  Positive for cough and shortness of breath.   Cardiovascular:  Positive for leg swelling.   Physical Exam Updated Vital Signs BP (!) 148/105    Pulse 91    Temp 100 F (37.8 C)    Resp 20    Wt (!) 146.3 kg    SpO2 94%    BMI 52.05 kg/m  Physical Exam Vitals and nursing note reviewed.  Constitutional:      General: He is not in acute distress.    Appearance: Normal appearance. He is well-developed.  HENT:     Head: Normocephalic and atraumatic.     Right Ear: Hearing normal.     Left Ear: Hearing normal.     Nose: Nose normal.  Eyes:     Conjunctiva/sclera: Conjunctivae normal.     Pupils: Pupils are equal, round, and reactive to light.  Cardiovascular:     Rate and Rhythm: Regular rhythm.     Heart sounds: S1 normal and S2 normal. No murmur heard.   No friction rub. No gallop.  Pulmonary:     Effort: Pulmonary effort is normal. No respiratory distress.     Breath sounds: Decreased breath sounds and wheezing present.  Chest:     Chest wall: No tenderness.  Abdominal:     General: Bowel sounds are normal.     Palpations: Abdomen is soft.      Tenderness: There is no abdominal tenderness. There is no guarding or rebound. Negative signs include Murphy's sign and McBurney's sign.     Hernia: No hernia is present.  Musculoskeletal:        General: Normal range of motion.     Cervical back: Normal range of motion and neck supple.     Right lower leg: Edema present.     Left lower leg: Edema present.  Skin:    General: Skin is warm and dry.     Findings: No rash.  Neurological:     Mental Status: He is alert and oriented to person, place, and time.     GCS: GCS eye subscore is 4. GCS verbal subscore is 5. GCS motor subscore is 6.     Cranial Nerves: No cranial nerve deficit.     Sensory: No sensory deficit.     Coordination: Coordination normal.  Psychiatric:        Speech: Speech normal.        Behavior: Behavior normal.        Thought Content: Thought content normal.  ED Results / Procedures / Treatments   Labs (all labs ordered are listed, but only abnormal results are displayed) Labs Reviewed  RESP PANEL BY RT-PCR (FLU A&B, COVID) ARPGX2 - Abnormal; Notable for the following components:      Result Value   SARS Coronavirus 2 by RT PCR POSITIVE (*)    All other components within normal limits  BASIC METABOLIC PANEL - Abnormal; Notable for the following components:   Chloride 97 (*)    CO2 34 (*)    Glucose, Bld 164 (*)    Creatinine, Ser 1.60 (*)    GFR, Estimated 51 (*)    All other components within normal limits  BRAIN NATRIURETIC PEPTIDE - Abnormal; Notable for the following components:   B Natriuretic Peptide 175.4 (*)    All other components within normal limits  TROPONIN I (HIGH SENSITIVITY) - Abnormal; Notable for the following components:   Troponin I (High Sensitivity) 19 (*)    All other components within normal limits  CBC    EKG EKG Interpretation  Date/Time:  Tuesday January 30 2021 22:59:13 EST Ventricular Rate:  111 PR Interval:  160 QRS Duration: 82 QT Interval:  350 QTC  Calculation: 476 R Axis:   42 Text Interpretation: Sinus tachycardia Biatrial enlargement Anterior infarct , age undetermined T wave abnormality, consider lateral ischemia Abnormal ECG When compared with ECG of 06-Dec-2017 22:00,no change is present PREVIOUS ECG IS PRESENT Confirmed by Orpah Greek [phone]) on 01/31/2021 12:20:15 AM  Radiology DG Chest Port 1 View  Result Date: 01/30/2021 CLINICAL DATA:  Cough and shortness of breath.  Leg swelling. EXAM: PORTABLE CHEST 1 VIEW COMPARISON:  Chest x-ray 12/09/2017. FINDINGS: Cardiac silhouette is enlarged similar to the prior study. There is no lung infiltrate, pleural effusion or pneumothorax identified. No acute fractures are seen. IMPRESSION: 1. No acute cardiopulmonary process. 2. Stable cardiomegaly. Electronically Signed   By: Ronney Asters M.D.   On: 01/30/2021 23:28    Procedures Procedures    Medications Ordered in ED Medications  acetaminophen (TYLENOL) tablet 1,000 mg (1,000 mg Oral Given 01/31/21 0055)    ED Course/ Medical Decision Making/ A&P                           Medical Decision Making Problems Addressed: Acute respiratory failure with hypoxia [facility]): acute illness or injury that poses a threat to life or bodily functions COVID-19: acute illness or injury with systemic symptoms that poses a threat to life or bodily functions  Amount and/or Complexity of Data Reviewed Independent Historian: spouse Labs: ordered. Decision-making details documented in ED Course. Radiology: ordered. Decision-making details documented in ED Course. ECG/medicine tests: ordered and independent interpretation performed. Decision-making details documented in ED Course.  Risk Decision regarding hospitalization.   Presents with cough, shortness of breath.  Symptoms ongoing for 1 week.  Patient with a fever at arrival here.  Chest x-ray does not show any evidence of pneumonia.  He does test positive for COVID.  He has had 1 previous COVID  infection and has been vaccinated.  Symptoms present for 1 week, not likely to respond to antivirals.  He does have some wheezing and does report that he has had some mild asthma in the past.  May benefit from steroid and bronchodilator therapy.  Patient also complaining of leg swelling and orthopnea.  Chest x-ray does not show any obvious edema.  BNP is marginally elevated.  He also has a  slight elevation of troponin at 19.  Patient hypoxic here off of supplemental oxygen.  This is likely secondary to the COVID.  Would likely benefit from echo and additional troponins to rule out myocarditis related to COVID infection.  Additionally, feel PE is less likely but will order CT angio to evaluate for PE associated with COVID causing slight elevation of troponin.  As patient drops into the high 70% oxygen saturation range at rest off of oxygen, will require admission.  CRITICAL CARE Performed by: Orpah Greek   Total critical care time: 32 minutes  Critical care time was exclusive of separately billable procedures and treating other patients.  Critical care was necessary to treat or prevent imminent or life-threatening deterioration.  Critical care was time spent personally by me on the following activities: development of treatment plan with patient and/or surrogate as well as nursing, discussions with consultants, evaluation of patient's response to treatment, examination of patient, obtaining history from patient or surrogate, ordering and performing treatments and interventions, ordering and review of laboratory studies, ordering and review of radiographic studies, pulse oximetry and re-evaluation of patient's condition.         Final Clinical Impression(s) / ED Diagnoses Final diagnoses:  COVID-19  Acute respiratory failure with hypoxia [facility])    Rx / DC Orders ED Discharge Orders     None         Jilliann Subramanian, Gwenyth Allegra, MD 01/31/21 0145

## 2021-01-31 NOTE — Progress Notes (Signed)
Patient: Randy Morgan, Randy Morgan  DOB: 11/09/1965 ANV:[id number] Transferring facility: [facility] Requesting provider: Dr. 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   56 year old male without any reported history of chronic pulmonary pathology, who presents to [facility] c/o 1 week of progressive shortness of breath, new onset productive cough, subjective fever.  Found to be COVID-positive in the ED today.   Additional work-up in ED today notable for the following: Objectively febrile; initial O2 sat 80% on RA, subsequently improving into the mid 90s on 3 L [place], in setting of no baseline supplemental O2 requirements.  He also complained of some mild orthopnea, some new onset edema in the b/l lower extremities, in the absence of any chest pain, and no reported history of known heart failure.  BNP 175 compared to 24 (3 years ago).  Troponin 19.  Chest x-ray clear.  EKG reportedly shows sinus tachycardia without evidence of acute ischemic changes.   In the setting of shortness of breath, new supplemental oxygen requirement, tachycardia, CTA chest has been ordered, with result currently pending.   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  Given evaluation for heart failure, I have chosen [place] over [place] for transfer destination.     Babs Bertin, DO Hospitalist

## 2021-01-31 NOTE — ED Notes (Signed)
Patient's wife concerned with discoloration of the patient's feet. Pulses palpated bilaterally, marked with a marker and confirmed with a doppler

## 2021-01-31 NOTE — ED Notes (Signed)
Patient consistently desaturating (upper 70's to mid 80's) while sleeping despite supplemental oxygen. MD and RT notified. Patient will be placed on CPAP.   Patient has an OSA diagnosis, does not wear a CPAP machine at home due to insurance coverage.

## 2021-01-31 NOTE — ED Notes (Addendum)
Pt transported to CT with tech on stretcher

## 2021-01-31 NOTE — Progress Notes (Signed)
Patient placed on CPAP at this time due to desaturations into the 70's on 4LNC. Patient has hx of OSA, supposed to be on CPAP. Patient placed on CPAP 15, Fio2 40%. Patient tolerating well at this time. SpO2 96%

## 2021-01-31 NOTE — ED Notes (Signed)
RT Note: Pt. inquired about Oxygen Concentrator/Nebulizer Device and Medication for use at home, made him aware to ask Physician once admitted and seen at [facility].

## 2021-01-31 NOTE — ED Notes (Signed)
Handoff report given to [facility] 

## 2021-01-31 NOTE — ED Notes (Signed)
Handoff report given to Narda Amber on 3E at [facility]

## 2021-01-31 NOTE — Plan of Care (Signed)
  Problem: Education: Goal: Knowledge of risk factors and measures for prevention of condition will improve Outcome: Progressing   Problem: Coping: Goal: Psychosocial and spiritual needs will be supported Outcome: Progressing   Problem: Respiratory: Goal: Will maintain a patent airway Outcome: Progressing Goal: Complications related to the disease process, condition or treatment will be avoided or minimized Outcome: Progressing   

## 2021-01-31 NOTE — H&P (Signed)
[facility] History and Physical  Sebastion Jun TIR:[id number] DOB: [birth year] DOA: 01/30/2021   PCP: Vevelyn Francois, NP  Specialists: None  Chief Complaint: Shortness of breath ongoing for 1 to 2 weeks  HPI: Randy Morgan is a 56 y.o. male with a past medical history of essential hypertension, chronic diastolic CHF, obstructive sleep apnea, who was in his usual state of health till about a week prior to his presentation when he started developing shortness of breath mainly with exertion.  Did not have a fever at that time.  His symptoms progressively worsened.  He found himself getting short of breath at work and with climbing 1 flight of stairs which is very unusual for him.  He also mentioned history suggestive of orthopnea and PND.  He has always had lower extremity edema but has worsened in the last few weeks.  And then he developed low-grade fever a few days ago followed by onset of a dry cough.  He experienced some chest discomfort whenever he would cough but none otherwise.  On the day of admission patient's symptoms got acutely worse and he presented to the emergency department.  He mentions that he has gained almost 150 pounds in the last 2 years.  He apparently has been diagnosed with sleep apnea but there was some trouble with him getting a CPAP machine of his own so he is using somebody else's machine at this time.  Denies any sick contacts.  Has been compliant with his medications.  In the emergency department he was found to have elevated BNP.  Found to be positive for COVID-19.  Chest x-ray did not show any acute findings except for cardiomegaly.  CT angiogram did not show any PE but showed moderate pericardial effusion.  He was noted to be hypoxic saturating 80% on room air improving to mid 90s on 3 L.  He was initially placed on BiPAP.  He was given dexamethasone.  Does not appear that he was given any diuretics.  Subsequently hospitalized for further management.  Home  Medications: Prior to Admission medications   Medication Sig Start Date End Date Taking? Authorizing Provider  hydrochlorothiazide (HYDRODIURIL) 25 MG tablet Take 25 mg by mouth daily.   Yes [provider]  ibuprofen (ADVIL) 200 MG tablet Take 200 mg by mouth every 6 (six) hours as needed for mild pain or moderate pain.   Yes [provider]    Allergies: No Known Allergies  Past Medical History: Past Medical History:  Diagnosis Date   Asthma    CHF (congestive heart failure) ([place])    Hypertension    Sleep apnea    hasn't started CPAP yet    Past Surgical History:  Procedure Laterality Date   NO PAST SURGERIES     VIDEO BRONCHOSCOPY Bilateral 12/09/2017   Procedure: VIDEO BRONCHOSCOPY WITHOUT FLUORO;  Surgeon: Laurin Coder, MD;  Location: [facility];  Service: Cardiopulmonary;  Laterality: Bilateral;    Social History: Lives with his family.  Quit smoking about 6 months ago.  Very occasional beer intake.  Denies any illicit drug use.  Usually independent with daily activities  Family History:  Family History  Problem Relation Age of Onset   Heart disease Mother    Diabetes Mother    Asthma Mother    Heart disease Father    Asthma Sister    Heart disease Brother    Heart attack Brother    Colon cancer Neg Hx    Esophageal cancer Neg Hx  Rectal cancer Neg Hx    Stomach cancer Neg Hx      Review of Systems - History obtained from the patient General ROS: positive for  - fatigue Psychological ROS: positive for - anxiety Ophthalmic ROS: negative ENT ROS: negative Allergy and Immunology ROS: negative Hematological and Lymphatic ROS: negative Endocrine ROS: negative Respiratory ROS: As in HPI Cardiovascular ROS: As in HPI Gastrointestinal ROS: no abdominal pain, change in bowel habits, or black or bloody stools Genito-Urinary ROS: no dysuria, trouble voiding, or hematuria Musculoskeletal ROS: Mentions swelling in his left forearm which has  been ongoing for more than a year and has increased in size Neurological ROS: no TIA or stroke symptoms Dermatological ROS: negative  Physical Examination  Vitals:   01/31/21 0900 01/31/21 1000 01/31/21 1200 01/31/21 1433  BP: (!) 144/90 (!) 152/95 134/86   Pulse: 91 96 95   Resp: 19 15 18    Temp:  (!) 96.5 F (35.8 C)    TempSrc:  Oral    SpO2: 92% 91% 93%   Weight:    (!) 144.7 kg  Height:    5\' 6"  (1.676 m)    BP 134/86    Pulse 95    Temp (!) 96.5 F (35.8 C) (Oral)    Resp 18    Ht 5\' 6"  (1.676 m)    Wt (!) 144.7 kg    SpO2 93%    BMI 51.49 kg/m   General appearance: alert, cooperative, appears stated age, and no distress Head: Normocephalic, without obvious abnormality, atraumatic Eyes: conjunctivae/corneas clear. PERRL, EOM's intact.  Throat: lips, mucosa, and tongue normal; teeth and gums normal Neck: no adenopathy, no carotid bruit, no JVD, supple, symmetrical, trachea midline, and thyroid not enlarged, symmetric, no tenderness/mass/nodules Resp: Normal effort at rest.  Diminished air entry at the bases.  No wheezing or rhonchi.  Few crackles. Cardio: regular rate and rhythm, S1, S2 normal, no murmur, click, rub or gallop GI: soft, non-tender; bowel sounds normal; no masses,  no organomegaly Extremities: Ill-defined mass appreciated in the proximal forearm next to the elbow joint.  Nontender.  He has good radial pulses. Pulses: 2+ and symmetric Skin: Skin color, texture, turgor normal. No rashes or lesions Lymph nodes: Cervical, supraclavicular, and axillary nodes normal. Neurologic: Patient is alert and oriented x3.  Cranial nerves II to XII intact.  Motor strength equal bilateral upper and lower extremity.  He does appear to have some contracture of the third fourth and fifth digits of left hand.   Labs on Admission: I have personally reviewed following labs and imaging studies  CBC: Recent Labs  Lab 01/30/21 2305  WBC 5.7  HGB 14.8  HCT 44.3  MCV 87.5  PLT  086   Basic Metabolic Panel: Recent Labs  Lab 01/30/21 2305  NA 139  K 3.5  CL 97*  CO2 34*  GLUCOSE 164*  BUN 16  CREATININE 1.60*  CALCIUM 9.0   GFR: Estimated Creatinine Clearance: 71 mL/min (A) (by C-G formula based on SCr of 1.6 mg/dL (H)).    Radiological Exams on Admission: CT Angio Chest Pulmonary Embolism (PE) W or WO Contrast  Result Date: 01/31/2021 CLINICAL DATA:  Productive cough, dyspnea, leg swelling. Pulmonary embolism (PE) suspected, high prob. EXAM: CT ANGIOGRAPHY CHEST WITH CONTRAST TECHNIQUE: Multidetector CT imaging of the chest was performed using the standard protocol during bolus administration of intravenous contrast. Multiplanar CT image reconstructions and MIPs were obtained to evaluate the vascular anatomy. CONTRAST:  153mL OMNIPAQUE IOHEXOL  350 MG/ML SOLN COMPARISON:  12/06/2017 FINDINGS: Cardiovascular: There is adequate opacification of the pulmonary arterial tree through the segmental level. No intraluminal filling defect identified to suggest acute pulmonary embolism. The central pulmonary arteries are enlarged in keeping with changes of pulmonary arterial hypertension. Mild coronary artery calcification. Mild global cardiomegaly. Moderate pericardial effusion has developed in the interval since prior examination. No CT evidence of cardiac tamponade. The thoracic aorta is unremarkable. Mediastinum/Nodes: No enlarged mediastinal, hilar, or axillary lymph nodes. Thyroid gland, trachea, and esophagus demonstrate no significant findings. Lungs/Pleura: There is central pulmonary vascular congestion. Additionally, there is mild peribronchovascular soft tissue thickening within the perihilar regions bilaterally likely reflecting trace perihilar pulmonary edema. No confluent pulmonary infiltrate. Minimal left basilar atelectasis. No pneumothorax or pleural effusion. No central obstructing mass. Upper Abdomen: Cholelithiasis.  No acute abnormality Musculoskeletal: No  acute bone abnormality. No lytic or blastic bone lesion. Review of the MIP images confirms the above findings. IMPRESSION: No pulmonary embolism. Interval development of a moderate pericardial effusion. No CT evidence of cardiac tamponade. Central pulmonary vascular congestion with trace perihilar pulmonary edema in keeping with mild cardiogenic failure. Mild coronary artery calcification. Mild global cardiomegaly. Morphologic changes in keeping with pulmonary arterial hypertension. Cholelithiasis Electronically Signed   By: Fidela Salisbury M.D.   On: 01/31/2021 03:15   DG Chest Port 1 View  Result Date: 01/30/2021 CLINICAL DATA:  Cough and shortness of breath.  Leg swelling. EXAM: PORTABLE CHEST 1 VIEW COMPARISON:  Chest x-ray 12/09/2017. FINDINGS: Cardiac silhouette is enlarged similar to the prior study. There is no lung infiltrate, pleural effusion or pneumothorax identified. No acute fractures are seen. IMPRESSION: 1. No acute cardiopulmonary process. 2. Stable cardiomegaly. Electronically Signed   By: Ronney Asters M.D.   On: 01/30/2021 23:28    My interpretation of Electrocardiogram: Sinus tachycardia 111 bpm.  Normal axis.  Intervals appear to be normal.  Nonspecific T wave changes.  No concerning ST segment changes noted.  Somewhat similar to previous EKG   Problem List  Principal Problem:   COVID-19 virus infection Active Problems:   Essential hypertension, benign   Acute respiratory failure with hypoxia (HCC)   Pre-diabetes   Acute diastolic CHF (congestive heart failure) (HCC)   Sleep apnea   Pericardial effusion   Acute diastolic (congestive) heart failure (HCC)   Assessment: This is a 56 year old African-American male with past medical history of essential hypertension, questionable history of chronic diastolic CHF, sleep apnea, morbid obesity, prediabetes who presents with shortness of breath ongoing for about a week.  He has lower extremity edema.  He has elevated BNP.  Patient  also noted to be positive for COVID-19 but does not have any opacities on imaging study.  Presentation seems to be most likely due to heart failure perhaps exacerbated by COVID-19 infection.  Plan:  Acute diastolic CHF with pericardial effusion: Last echocardiogram from 2019 showed normal systolic function.  Trivial pericardial effusion was noted at that time.  Echocardiogram will be repeated.  CT angiogram does suggest a moderate pericardial effusion.  This could be secondary to COVID-19.  Will check TSH.  Currently no clinical signs or symptoms of tamponade.  Patient will be given IV diuretics.  Troponin levels are minimally elevated and likely due to CHF rather than an acute coronary syndrome.  Patient also reports that his brother died of sudden cardiac death 2 years ago.  Patient will need to be seen by a cardiologist eventually.  COVID-19 infection: No opacities noted on chest  x-ray or CT angiogram.  This could be an incidental infection but remains unclear at this time.  We will check inflammatory markers.  We will place him on Remdesivir for now.  He was given dexamethasone in the ED.  We will hold off on that for now.  Acute respiratory failure with hypoxia: Most likely due to pulm edema from diastolic CHF rather than [id number].  Patient mentions that he is feeling somewhat better now.  Currently on 4 L of oxygen nasal cannula.  Wean down as tolerated.  Elevated creatinine: Significance unclear.  Creatinine was normal back in February 2022.  Monitor urine output.  Avoid nephrotoxic agent.  Recheck labs tomorrow.  Obstructive sleep apnea: Continue CPAP  Pre-Diabetes: Check HbA1c tomorrow along with a fasting glucose level.  Essential hypertension: Monitor blood pressures closely.  Swelling left forearm/lipoma: This has been ongoing for a year or so.  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  It appears to have grown in size.  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  Patient may benefit from being seen by orthopedics in the outpatient setting.  Morbid obesity: Estimated body mass index is 51.49 kg/m as calculated from the following:   Height as of this encounter: 5\' 6"  (1.676 m).   Weight as of this encounter: 144.7 kg.   DVT Prophylaxis: Subcutaneous heparin Code Status: Full code Family Communication: Discussed with patient.  No family at bedside Disposition: Hopefully return home in improved Consults called: None at this time Admission Status: Status is: Inpatient  Remains inpatient appropriate because: Acute respiratory failure with hypoxia, acute diastolic CHF   Severity of Illness: The appropriate patient status for this patient is INPATIENT. Inpatient status is judged to be reasonable and necessary in order to provide the required intensity of service to ensure the patient's safety. The patient's presenting symptoms, physical exam findings, and initial radiographic and laboratory data in the context of their chronic comorbidities is felt to place them at high risk for further clinical deterioration. Furthermore, it is not anticipated that the patient will be medically stable for discharge from the hospital within 2 midnights of admission.   * I certify that at the point of admission it is my clinical judgment that the patient will require inpatient hospital care spanning beyond 2 midnights from the point of admission due to high intensity of service, high risk for further deterioration and high frequency of surveillance required.*   Further management decisions will depend on results of further testing and patient's response to treatment.   Altus Zaino [employer]  [facility] operational officer on [email or web address]  01/31/2021, 4:34 PM

## 2021-01-31 NOTE — ED Notes (Signed)
RT Note: Pt. taken off BiPAP(Servo-I), placed on 4 lpm humidified n/c, per home setting, remains awaiting [facility] for admission, call bell in reach.

## 2021-01-31 NOTE — TOC Progression Note (Signed)
Transition of Care [facility]) - Progression Note    Patient Details  Name: Randy Morgan MRN: [id number] Date of Birth: May 20, 1965  Transition of Care [facility]) CM/SW Contact  Zenon Mayo, RN Phone Number: 01/31/2021, 9:21 PM  Clinical Narrative:     Transition of Care [facility]) Screening Note   Patient Details  Name: Randy Morgan Date of Birth: [birth year]   Transition of Care [facility]) CM/SW Contact:    Zenon Mayo, RN Phone Number: 01/31/2021, 9:21 PM    Transition of Care Department [facility]) has reviewed patient and no TOC needs have been identified at this time. We will continue to monitor patient advancement through interdisciplinary progression rounds. If new patient transition needs arise, please place a TOC consult.          Expected Discharge Plan and Services                                                 Social Determinants of Health (SDOH) Interventions    Readmission Risk Interventions No flowsheet data found.

## 2021-02-01 ENCOUNTER — Encounter (HOSPITAL_COMMUNITY): Payer: Self-pay | Admitting: Internal Medicine

## 2021-02-01 ENCOUNTER — Inpatient Hospital Stay (HOSPITAL_COMMUNITY): Payer: 59

## 2021-02-01 DIAGNOSIS — I5031 Acute diastolic (congestive) heart failure: Secondary | ICD-10-CM | POA: Diagnosis not present

## 2021-02-01 DIAGNOSIS — I38 Endocarditis, valve unspecified: Secondary | ICD-10-CM

## 2021-02-01 DIAGNOSIS — I5041 Acute combined systolic (congestive) and diastolic (congestive) heart failure: Secondary | ICD-10-CM | POA: Diagnosis not present

## 2021-02-01 DIAGNOSIS — I10 Essential (primary) hypertension: Secondary | ICD-10-CM

## 2021-02-01 DIAGNOSIS — U071 COVID-19: Secondary | ICD-10-CM | POA: Diagnosis not present

## 2021-02-01 DIAGNOSIS — J9601 Acute respiratory failure with hypoxia: Secondary | ICD-10-CM | POA: Diagnosis not present

## 2021-02-01 DIAGNOSIS — I3139 Other pericardial effusion (noninflammatory): Secondary | ICD-10-CM

## 2021-02-01 LAB — BASIC METABOLIC PANEL
Anion gap: 9 (ref 5–15)
BUN: 25 mg/dL — ABNORMAL HIGH (ref 6–20)
CO2: 30 mmol/L (ref 22–32)
Calcium: 8.5 mg/dL — ABNORMAL LOW (ref 8.9–10.3)
Chloride: 99 mmol/L (ref 98–111)
Creatinine, Ser: 1.6 mg/dL — ABNORMAL HIGH (ref 0.61–1.24)
GFR, Estimated: 51 mL/min — ABNORMAL LOW (ref >60)
Glucose, Bld: 173 mg/dL — ABNORMAL HIGH (ref 70–99)
Potassium: 3.6 mmol/L (ref 3.5–5.1)
Sodium: 138 mmol/L (ref 135–145)

## 2021-02-01 LAB — ECHOCARDIOGRAM LIMITED
Calc EF: 37.9 %
Height: 66 in
S' Lateral: 3.9 cm
Single Plane A2C EF: 35.8 %
Single Plane A4C EF: 35.5 %
Weight: 5089.98 oz

## 2021-02-01 LAB — CBC
HCT: 44.7 % (ref 39.0–52.0)
Hemoglobin: 14.7 g/dL (ref 13.0–17.0)
MCH: 29.5 pg (ref 26.0–34.0)
MCHC: 32.9 g/dL (ref 30.0–36.0)
MCV: 89.8 fL (ref 80.0–100.0)
Platelets: 187 10*3/uL (ref 150–400)
RBC: 4.98 MIL/uL (ref 4.22–5.81)
RDW: 13.2 % (ref 11.5–15.5)
WBC: 4.6 10*3/uL (ref 4.0–10.5)
nRBC: 0 % (ref 0.0–0.2)

## 2021-02-01 LAB — HEPATIC FUNCTION PANEL
ALT: 23 U/L (ref 0–44)
AST: 22 U/L (ref 15–41)
Albumin: 3.3 g/dL — ABNORMAL LOW (ref 3.5–5.0)
Alkaline Phosphatase: 49 U/L (ref 38–126)
Bilirubin, Direct: 0.2 mg/dL (ref 0.0–0.2)
Indirect Bilirubin: 0.3 mg/dL (ref 0.3–0.9)
Total Bilirubin: 0.5 mg/dL (ref 0.3–1.2)
Total Protein: 6.8 g/dL (ref 6.5–8.1)

## 2021-02-01 LAB — TSH: TSH: 0.502 u[IU]/mL (ref 0.350–4.500)

## 2021-02-01 LAB — C-REACTIVE PROTEIN: CRP: 4.3 mg/dL — ABNORMAL HIGH (ref <1.0)

## 2021-02-01 LAB — HEMOGLOBIN A1C
Hgb A1c MFr Bld: 6.1 % — ABNORMAL HIGH (ref 4.8–5.6)
Mean Plasma Glucose: 128.37 mg/dL

## 2021-02-01 LAB — HIV ANTIBODY (ROUTINE TESTING W REFLEX): HIV Screen 4th Generation wRfx: NONREACTIVE

## 2021-02-01 MED ORDER — POTASSIUM CHLORIDE CRYS ER 20 MEQ PO TBCR
40.0000 meq | EXTENDED_RELEASE_TABLET | Freq: Once | ORAL | Status: AC
  Administered 2021-02-01: 40 meq via ORAL
  Filled 2021-02-01: qty 2

## 2021-02-01 MED ORDER — FUROSEMIDE 10 MG/ML IJ SOLN
60.0000 mg | Freq: Two times a day (BID) | INTRAMUSCULAR | Status: DC
  Administered 2021-02-01 – 2021-02-04 (×7): 60 mg via INTRAVENOUS
  Filled 2021-02-01 (×7): qty 6

## 2021-02-01 NOTE — Progress Notes (Addendum)
[facility] PROGRESS NOTE   Randy Morgan WNU:[id number] DOB: Jun 13, 1965 DOA: 01/30/2021  PCP: Vevelyn Francois, NP  Brief History/Interval Summary: 56 y.o. male with a past medical history of essential hypertension, chronic diastolic CHF, obstructive sleep apnea, who was in his usual state of health till about a week prior to his presentation when he started developing shortness of breath mainly with exertion.  Also mention history suggestive of orthopnea and PND.  Presented to ED with with these complaints.  Was found to be positive for COVID-19.  Concern for CHF was present.  Patient was hospitalized for further management   Reason for Visit: Acute CHF, likely diastolic  Consultants: None  Procedures: Transthoracic echocardiogram    Subjective/Interval History: Patient mentions that he is feeling slightly better this morning.  Less short of breath.  Not coughing as much.  Was taken off of oxygen this morning.   Assessment/Plan:  Acute diastolic CHF with pericardial effusion Previous echocardiogram from 2019 showed normal systolic function.  Trivial pericardial effusion was noted at that time. Patient presented with symptoms of orthopnea PND and lower extremity edema. Placed on IV diuretics.  Diuresed well last night with improvement in symptoms.  Continue IV diuretics for now. Echocardiogram from this admission is pending.  He was found to have moderate pericardial effusion on CT scan without any evidence for tamponade. TSH is noted to be normal. Mildly elevated troponin levels noted. CT angiogram did not show PE.   Strict ins and outs, daily weights.  COVID-19 infection No opacities noted on chest x-ray or CT angiogram.  CRP is very minimally elevated.  Continue Remdesivir for now.  Hold off on steroids.  Acute respiratory failure with hypoxia Most likely due to fluid overload from CHF Seems like he has been weaned off of oxygen this morning.  Continue to  monitor.  Elevated creatinine Significance unclear.  Stable this morning.  Continue to monitor urine output and recheck labs tomorrow.  Prediabetes HbA1c 6.1.  Noted to have glucose of 173 this morning.  Could be lingering effect of steroid.  We will recheck glucose level tomorrow morning.  Obstructive sleep apnea CPAP  Essential hypertension Occasional high readings noted.  His home medications include hydrochlorothiazide which is currently on hold as he is on furosemide.  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  It appears to have grown in size.  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  Patient may benefit from being seen by orthopedics in the outpatient setting.   Morbid obesity: Estimated body mass index is 51.49 kg/m as calculated from the following:   Height as of this encounter: 5\' 6"  (1.676 m).   Weight as of this encounter: 144.7 kg.   DVT Prophylaxis: Subcutaneous heparin Code Status: Full code Family Communication: Discussed with patient Disposition Plan: Hopefully return home when improved  Status is: Inpatient  Remains inpatient appropriate because: CHF exacerbation    Medications: Scheduled:  furosemide  60 mg Intravenous Q12H   heparin  5,000 Units Subcutaneous Q8H   sodium chloride flush  3 mL Intravenous Q12H   Continuous:  sodium chloride     remdesivir 100 mg  in NS 100 mL Stopped (02/01/21 1019)   GBT:DVVOHY chloride, acetaminophen, albuterol, ondansetron (ZOFRAN) IV, sodium chloride flush  Antibiotics: Anti-infectives (From admission, onward)    Start     Dose/Rate Route Frequency Ordered Stop   02/01/21 1000  remdesivir 100 mg in sodium chloride 0.9 % 100 mL IVPB       See  Hyperspace for full Linked Orders Report.   100 mg 200 mL/hr over 30 Minutes Intravenous Daily 01/31/21 1637 02/05/21 0959   01/31/21 1730  remdesivir 200 mg in sodium chloride 0.9% 250 mL IVPB       See Hyperspace for full Linked Orders Report.   200 mg 580 mL/hr over 30 Minutes Intravenous Once 01/31/21 1637 01/31/21 1913       Objective:  Vital Signs  Vitals:   01/31/21 1845 02/01/21 0510 02/01/21 0514 02/01/21 0853  BP: (!) 151/94  (!) 143/124 (!) 141/95  Pulse: 95  97 97  Resp: 18  20 18   Temp: 98.6 F (37 C)  98.1 F (36.7 C) 98 F (36.7 C)  TempSrc: Oral  Oral Oral  SpO2: 98%  97% 93%  Weight:  (!) 144.3 kg    Height:        Intake/Output Summary (Last 24 hours) at 02/01/2021 1208 Last data filed at 02/01/2021 1019 Gross per 24 hour  Intake 2041.3 ml  Output 2400 ml  Net -358.7 ml   Filed Weights   01/30/21 2327 01/31/21 1433 02/01/21 0510  Weight: (!) 146.3 kg (!) 144.7 kg (!) 144.3 kg    General appearance: Awake alert.  In no distress Resp: Mildly tachypneic at rest.  No use of accessory muscles.  Crackles bilateral bases.  No wheezing or rhonchi Cardio: S1-S2 is normal regular.  No S3-S4.  No rubs murmurs or bruit GI: Abdomen is soft.  Nontender nondistended.  Bowel sounds are present normal.  No masses organomegaly Extremities: 2+ edema bilateral lower extremities Swelling noted in the left forearm near the elbow.  Stable from yesterday.  Good peripheral pulses. Neurologic: Alert and oriented x3.  No focal neurological deficits.    Lab Results:  Data Reviewed: I have personally reviewed following labs and imaging studies  CBC: Recent Labs  Lab 01/30/21 2305 02/01/21 0045  WBC 5.7 4.6  HGB 14.8 14.7  HCT 44.3 44.7  MCV 87.5 89.8  PLT 205 073    Basic Metabolic Panel: Recent Labs  Lab 01/30/21 2305 02/01/21 0045  NA 139 138  K 3.5 3.6  CL 97* 99  CO2 34* 30  GLUCOSE 164* 173*  BUN 16 25*  CREATININE 1.60* 1.60*  CALCIUM 9.0 8.5*     GFR: Estimated Creatinine Clearance: 70.8 mL/min (A) (by C-G formula based on SCr of 1.6 mg/dL (H)).  Liver Function Tests: Recent Labs  Lab 02/01/21 0045  AST 22  ALT 23  ALKPHOS 49  BILITOT 0.5  PROT 6.8  ALBUMIN 3.3*    HbA1C: Recent Labs    02/01/21 0045  HGBA1C 6.1*     Thyroid Function Tests: Recent Labs    02/01/21 0045  TSH 0.502     Recent Results (from the past 240 hour(s))  SARS CORONAVIRUS 2 (TAT 6-24 HRS) Nasopharyngeal Nasopharyngeal Swab     Status: Abnormal   Collection Time: 01/30/21  9:10 PM   Specimen: Nasopharyngeal Swab  Result Value Ref Range Status   SARS Coronavirus 2 POSITIVE (A) NEGATIVE Final    Comment: (NOTE) SARS-CoV-2 target nucleic acids are DETECTED.  The SARS-CoV-2 RNA is generally detectable in upper and lower respiratory specimens during the acute phase of infection. Positive results are indicative of the presence of SARS-CoV-2 RNA. Clinical correlation with patient history and other diagnostic information is  necessary to determine patient infection status. Positive results do not rule out bacterial infection or co-infection with other viruses.  The expected result is Negative.  Fact Sheet for Patients: SugarRoll.be  Fact Sheet for Healthcare Providers: https://www.woods-mathews.com/  This test is not yet approved or cleared by the [place] FDA and  has been authorized for detection and/or diagnosis of SARS-CoV-2 by FDA under an Emergency Use Authorization (EUA). This EUA will remain  in effect (meaning this test can be used) for the duration of the COVID-19 declaration under Section 564(b)(1) of the Act, 21 U. S.C. section 360bbb-3(b)(1), unless the authorization is terminated or revoked sooner.   Performed at [facility], [place] [address] [place], [place] [address]   Resp Panel by RT-PCR (Flu A&B, Covid) Nasopharyngeal Swab     Status: Abnormal    Collection Time: 01/30/21 11:09 PM   Specimen: Nasopharyngeal Swab; Nasopharyngeal(NP) swabs in vial transport medium  Result Value Ref Range Status   SARS Coronavirus 2 by RT PCR POSITIVE (A) NEGATIVE Final    Comment: (NOTE) SARS-CoV-2 target nucleic acids are DETECTED.  The SARS-CoV-2 RNA is generally detectable in upper respiratory specimens during the acute phase of infection. Positive results are indicative of the presence of the identified virus, but do not rule out bacterial infection or co-infection with other pathogens not detected by the test. Clinical correlation with patient history and other diagnostic information is necessary to determine patient infection status. The expected result is Negative.  Fact Sheet for Patients: EntrepreneurPulse.com.au  Fact Sheet for Healthcare Providers: IncredibleEmployment.be  This test is not yet approved or cleared by the [place] FDA and  has been authorized for detection and/or diagnosis of SARS-CoV-2 by FDA under an Emergency Use Authorization (EUA).  This EUA will remain in effect (meaning this test can be used) for the duration of  the COVID-19 declaration under Section 564(b)(1) of the A ct, 21 U.S.C. section 360bbb-3(b)(1), unless the authorization is terminated or revoked sooner.     Influenza A by PCR NEGATIVE NEGATIVE Final   Influenza B by PCR NEGATIVE NEGATIVE Final    Comment: (NOTE) The Xpert Xpress SARS-CoV-2/FLU/RSV plus assay is intended as an aid in the diagnosis of influenza from Nasopharyngeal swab specimens and should not be used as a sole basis for treatment. Nasal washings and aspirates are unacceptable for Xpert Xpress SARS-CoV-2/FLU/RSV testing.  Fact Sheet for Patients: EntrepreneurPulse.com.au  Fact Sheet for Healthcare Providers: IncredibleEmployment.be  This test is not yet approved or cleared by the [place] FDA  and has been authorized for detection and/or diagnosis of SARS-CoV-2 by FDA under an Emergency Use Authorization (EUA). This EUA will remain in effect (meaning this test can be used) for the duration of the COVID-19 declaration under Section 564(b)(1) of the Act, 21 U.S.C. section 360bbb-3(b)(1), unless the authorization is terminated or revoked.  Performed at [facility], [address] [place],  [address]       Radiology Studies: CT Angio Chest Pulmonary Embolism (PE) W or WO Contrast  Result Date: 01/31/2021 CLINICAL DATA:  Productive cough, dyspnea, leg swelling. Pulmonary embolism (PE) suspected, high prob. EXAM: CT ANGIOGRAPHY CHEST WITH CONTRAST TECHNIQUE: Multidetector CT imaging of the chest was performed using the standard protocol during bolus  administration of intravenous contrast. Multiplanar CT image reconstructions and MIPs were obtained to evaluate the vascular anatomy. CONTRAST:  165mL OMNIPAQUE IOHEXOL 350 MG/ML SOLN COMPARISON:  12/06/2017 FINDINGS: Cardiovascular: There is adequate opacification of the pulmonary arterial tree through the segmental level. No intraluminal filling defect identified to suggest acute pulmonary embolism. The central pulmonary arteries are enlarged in keeping with changes of pulmonary arterial hypertension. Mild coronary artery calcification. Mild global cardiomegaly. Moderate pericardial effusion has developed in the interval since prior examination. No CT evidence of cardiac tamponade. The thoracic aorta is unremarkable. Mediastinum/Nodes: No enlarged mediastinal, hilar, or axillary lymph nodes. Thyroid gland, trachea, and esophagus demonstrate no significant findings. Lungs/Pleura: There is central pulmonary vascular congestion. Additionally, there is mild peribronchovascular soft tissue thickening within the perihilar regions bilaterally likely reflecting trace perihilar pulmonary edema. No confluent pulmonary infiltrate.  Minimal left basilar atelectasis. No pneumothorax or pleural effusion. No central obstructing mass. Upper Abdomen: Cholelithiasis.  No acute abnormality Musculoskeletal: No acute bone abnormality. No lytic or blastic bone lesion. Review of the MIP images confirms the above findings. IMPRESSION: No pulmonary embolism. Interval development of a moderate pericardial effusion. No CT evidence of cardiac tamponade. Central pulmonary vascular congestion with trace perihilar pulmonary edema in keeping with mild cardiogenic failure. Mild coronary artery calcification. Mild global cardiomegaly. Morphologic changes in keeping with pulmonary arterial hypertension. Cholelithiasis Electronically Signed   By: Fidela Salisbury M.D.   On: 01/31/2021 03:15   DG Chest Port 1 View  Result Date: 01/30/2021 CLINICAL DATA:  Cough and shortness of breath.  Leg swelling. EXAM: PORTABLE CHEST 1 VIEW COMPARISON:  Chest x-ray 12/09/2017. FINDINGS: Cardiac silhouette is enlarged similar to the prior study. There is no lung infiltrate, pleural effusion or pneumothorax identified. No acute fractures are seen. IMPRESSION: 1. No acute cardiopulmonary process. 2. Stable cardiomegaly. Electronically Signed   By: Ronney Asters M.D.   On: 01/30/2021 23:28       LOS: 1 day   [facility] Pager on [email or web address]  02/01/2021, 12:08 PM

## 2021-02-01 NOTE — Consult Note (Signed)
Cardiology Consultation:   Patient ID: Randy Morgan MRN: [id number]; DOB: Sep 21, 1965  Admit date: 01/30/2021 Date of Consult: 2021/02/04  PCP:  Vevelyn Francois, NP   [facility] Providers Cardiologist:  Buford Dresser, MD   {    Patient Profile:   Randy Morgan is a 56 y.o. male with a hx of essential HTN, chronic diastolic CHF, obstructive sleep apnea who is being seen February 04, 2021 for the evaluation of CHF at the request of Dr. Maryland Pink. Patient is COVID positive (tested on 01/30/21)  History of Present Illness:   Randy Morgan is a 56 y.o. male with a hx of essential HTN, chronic diastolic CHF, obstructive sleep apnea, current tobacco use who is being seen 2021-02-04 for the evaluation of CHF at the request of Dr. [place] Pink.  Patient does not have a primary cardiologist. History of sleep apnea, wears a CPAP that he got from a friend. Patient's most recent hospitalization was in 11/2017 for evaluation of shortness of breath and hypoxia. Echocardiogram on 12/07/17 showed EF 55-60%, normal LV function, grade I diastolic diastolic dysfunction. Peak pulmonary artery pressure was 65mHg.   Remote cocaine use as teenager Smokes  Heavy machine operator   Patient presented to the ED on 1/3 complaining of cough, dyspnea on exertion, and generalized body aches. Cough was nonproductive and had been going on for 1 week. Associated with right sided chest pain that is only present when coughing. SOB was worse on exertion. In the ED, labs showed Na 138, K 3.6, BUN 25, creatinine 1.60, eGFR 51, hemoglobin 14.7, hematocrit 44.7. BNP elevated to 175.4. HSTN 19>>21. Postitive for COVID.  CXR showed no acute cardiopulmonary process, stable cardiomegaly. 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 EKG showed sinus tachycardia (rate 111), biatrial enlargement, anterior infarct (age undetermined), T wave abnormality in lateral leads. Not significantly changed  when compared to EKG from 12/06/17, except atrial enlargement and T wave abnormalities are more pronounced. Echo completed 12023/01/08showed EF 35-40% (down from 55-60% in 2019), moderately decreased LV function and LV global hypokinesis. Moderately elevated pulmonary artery systolic pressure. Moderate pericardial effusion.    On interview, patient reports that he has had shortness of breath and swelling in his legs for the past few weeks.  Shortness of breath is worse when he is laying flat, on exertion and limits his daily activities. Associated right sided chest pain that is worse when he coughs.  Reports that he has gained about 150 pounds in the past year.  Denies palpitations, dizziness.  Continues to use tobacco products socially.  Drinks socially. Patient has been on IV diuretics for 1 day and believes that his symptoms are improving   Past Medical History:  Diagnosis Date   Asthma    CHF (congestive heart failure) ([facility]    Hypertension    Sleep apnea    hasn't started CPAP yet    Past Surgical History:  Procedure Laterality Date   NO PAST SURGERIES     VIDEO BRONCHOSCOPY Bilateral 12/09/2017   Procedure: VIDEO BRONCHOSCOPY WITHOUT FLUORO;  Surgeon: OLaurin Coder MD;  Location: [facility];  Service: Cardiopulmonary;  Laterality: Bilateral;     Home Medications:  Prior to Admission medications   Medication Sig Start Date End Date Taking? Authorizing Provider  hydrochlorothiazide (HYDRODIURIL) 25 MG tablet Take 25 mg by mouth daily.   Yes [provider]  ibuprofen (ADVIL) 200 MG tablet Take 200 mg by mouth every 6 (six) hours as needed  for mild pain or moderate pain.   Yes [provider]    Inpatient Medications: Scheduled Meds:  furosemide  60 mg Intravenous Q12H   heparin  5,000 Units Subcutaneous Q8H   sodium chloride flush  3 mL Intravenous Q12H   Continuous Infusions:  sodium chloride     remdesivir 100 mg in NS 100 mL Stopped (02/01/21 1019)    PRN Meds: sodium chloride, acetaminophen, albuterol, ondansetron (ZOFRAN) IV, sodium chloride flush  Allergies:   No Known Allergies  Social History:   Social History   Socioeconomic History   Marital status: Married    Spouse name: Not on file   Number of children: 2   Years of education: 12th grade + 2 years college   Highest education level: Some college, no degree  Occupational History   Occupation: makes concrete blocks and bricks   Occupation: Freight forwarder  Tobacco Use   Smoking status: Some Days    Packs/day: 0.50    Years: 25.00    Pack years: 12.50    Types: Cigarettes   Smokeless tobacco: Former  Scientific laboratory technician Use: Never used  Substance and Sexual Activity   Alcohol use: Not Currently    Alcohol/week: 2.0 standard drinks    Types: 2 Cans of beer per week    Comment: occ beer   Drug use: No   Sexual activity: Not Currently  Other Topics Concern   Not on file  Social History Narrative   Lives with his wife and daughter.   Other child lives independently in [place].   Drinks about 3 sodas a day    Social Determinants of Radio broadcast assistant Strain: High Risk   Difficulty of Paying Living Expenses: Hard  Food Insecurity: No Food Insecurity   Worried About Charity fundraiser in the Last Year: Never true   Ran Out of Food in the Last Year: Never true  Transportation Needs: No Transportation Needs   Lack of Transportation (Medical): No   Lack of Transportation (Non-Medical): No  Physical Activity: Not on file  Stress: Not on file  Social Connections: Not on file  Intimate Partner Violence: Not on file    Family History:    Family History  Problem Relation Age of Onset   Heart disease Mother    Diabetes Mother    Asthma Mother    Heart disease Father    Asthma Sister    Heart disease Brother    Heart attack Brother    Colon cancer Neg Hx    Esophageal cancer Neg Hx    Rectal cancer Neg Hx    Stomach cancer Neg Hx       ROS:  Please see the history of present illness.   All other ROS reviewed and negative.     Physical Exam/Data:   Vitals:   01/31/21 1845 02/01/21 0510 02/01/21 0514 02/01/21 0853  BP: (!) 151/94  (!) 143/124 (!) 141/95  Pulse: 95  97 97  Resp: 18  20 18   Temp: 98.6 F (37 C)  98.1 F (36.7 C) 98 F (36.7 C)  TempSrc: Oral  Oral Oral  SpO2: 98%  97% 93%  Weight:  (!) 144.3 kg    Height:        Intake/Output Summary (Last 24 hours) at 02/01/2021 1732 Last data filed at 02/01/2021 1507 Gross per 24 hour  Intake 841.3 ml  Output 2850 ml  Net -2008.7 ml   Last 3  Weights 02/01/2021 01/31/2021 01/30/2021  Weight (lbs) 318 lb 2 oz 319 lb 322 lb 8 oz  Weight (kg) 144.3 kg 144.697 kg 146.285 kg     Body mass index is 51.35 kg/m.  General:  Well nourished, well developed, in no acute distress HEENT: normal Vascular:Distal pulses 2+ bilaterally Cardiac:  normal S1, S2; RRR; no murmur  Lungs:  clear to auscultation bilaterally, no wheezing, rhonchi or rales  Abd: soft, nontender, no hepatomegaly  Ext: 2+ edema to mid shin  Musculoskeletal:  No deformities Skin: warm and dry  Neuro:  CNs 2-12 intact, no focal abnormalities noted Psych:  Normal affect   EKG:  The EKG was personally reviewed and demonstrates:  sinus tachycardia (rate 111), biatrial enlargement, anterior infarct (age undetermined), T wave abnormality in lateral leads. Not significantly changed when compared to EKG from 12/06/17, except atrial enlargement and T wave abnormalities are more pronounced. Telemetry:  Telemetry was personally reviewed and demonstrates:  sinus rhythm with occasional tachycardia to the 120s (3 episodes that resolved spontaneously)  Relevant CV Studies:  Echo 02/01/2021   1. Compared to echo report from 2019, LVEF is now depressed.   2. Left ventricular ejection fraction, by estimation, is 35 to 40%. The  left ventricle has moderately decreased function. The left ventricle  demonstrates global  hypokinesis. There is severe left ventricular  hypertrophy. Indeterminate diastolic filling  due to E-A fusion.   3. Right ventricular systolic function is low normal. The right  ventricular size is normal. There is moderately elevated pulmonary artery  systolic pressure.   4. Moderate pericardial effusion.   5. The mitral valve is normal in structure. Mild mitral valve  regurgitation.   6. The aortic valve is tricuspid. Aortic valve regurgitation is not  visualized. Aortic valve sclerosis/calcification is present, without any  evidence of aortic stenosis.   7. The inferior vena cava is dilated in size with <50% respiratory  variability, suggesting right atrial pressure of 15 mmHg.    Laboratory Data:  High Sensitivity Troponin:   Recent Labs  Lab 01/31/21 0045 01/31/21 0236  TROPONINIHS 19* 21*     Chemistry Recent Labs  Lab 01/30/21 2305 02/01/21 0045  NA 139 138  K 3.5 3.6  CL 97* 99  CO2 34* 30  GLUCOSE 164* 173*  BUN 16 25*  CREATININE 1.60* 1.60*  CALCIUM 9.0 8.5*  GFRNONAA 51* 51*  ANIONGAP 8 9    Recent Labs  Lab 02/01/21 0045  PROT 6.8  ALBUMIN 3.3*  AST 22  ALT 23  ALKPHOS 49  BILITOT 0.5   Lipids No results for input(s): CHOL, TRIG, HDL, LABVLDL, LDLCALC, CHOLHDL in the last 168 hours.  Hematology Recent Labs  Lab 01/30/21 2305 02/01/21 0045  WBC 5.7 4.6  RBC 5.06 4.98  HGB 14.8 14.7  HCT 44.3 44.7  MCV 87.5 89.8  MCH 29.2 29.5  MCHC 33.4 32.9  RDW 13.3 13.2  PLT 205 187   Thyroid  Recent Labs  Lab 02/01/21 0045  TSH 0.502    BNP Recent Labs  Lab 01/31/21 0045  BNP 175.4*    DDimer No results for input(s): DDIMER in the last 168 hours.   Radiology/Studies:  CT Angio Chest Pulmonary Embolism (PE) W or WO Contrast  Result Date: 01/31/2021 CLINICAL DATA:  Productive cough, dyspnea, leg swelling. Pulmonary embolism (PE) suspected, high prob. EXAM: CT ANGIOGRAPHY CHEST WITH CONTRAST TECHNIQUE: Multidetector CT imaging of the  chest was performed using the standard protocol during bolus  administration of intravenous contrast. Multiplanar CT image reconstructions and MIPs were obtained to evaluate the vascular anatomy. CONTRAST:  160m OMNIPAQUE IOHEXOL 350 MG/ML SOLN COMPARISON:  12/06/2017 FINDINGS: Cardiovascular: There is adequate opacification of the pulmonary arterial tree through the segmental level. No intraluminal filling defect identified to suggest acute pulmonary embolism. The central pulmonary arteries are enlarged in keeping with changes of pulmonary arterial hypertension. Mild coronary artery calcification. Mild global cardiomegaly. Moderate pericardial effusion has developed in the interval since prior examination. No CT evidence of cardiac tamponade. The thoracic aorta is unremarkable. Mediastinum/Nodes: No enlarged mediastinal, hilar, or axillary lymph nodes. Thyroid gland, trachea, and esophagus demonstrate no significant findings. Lungs/Pleura: There is central pulmonary vascular congestion. Additionally, there is mild peribronchovascular soft tissue thickening within the perihilar regions bilaterally likely reflecting trace perihilar pulmonary edema. No confluent pulmonary infiltrate. Minimal left basilar atelectasis. No pneumothorax or pleural effusion. No central obstructing mass. Upper Abdomen: Cholelithiasis.  No acute abnormality Musculoskeletal: No acute bone abnormality. No lytic or blastic bone lesion. Review of the MIP images confirms the above findings. IMPRESSION: No pulmonary embolism. Interval development of a moderate pericardial effusion. No CT evidence of cardiac tamponade. Central pulmonary vascular congestion with trace perihilar pulmonary edema in keeping with mild cardiogenic failure. Mild coronary artery calcification. Mild global cardiomegaly. Morphologic changes in keeping with pulmonary arterial hypertension. Cholelithiasis Electronically Signed   By: AFidela SalisburyM.D.   On: 01/31/2021 03:15    DG Chest Port 1 View  Result Date: 01/30/2021 CLINICAL DATA:  Cough and shortness of breath.  Leg swelling. EXAM: PORTABLE CHEST 1 VIEW COMPARISON:  Chest x-ray 12/09/2017. FINDINGS: Cardiac silhouette is enlarged similar to the prior study. There is no lung infiltrate, pleural effusion or pneumothorax identified. No acute fractures are seen. IMPRESSION: 1. No acute cardiopulmonary process. 2. Stable cardiomegaly. Electronically Signed   By: ARonney AstersM.D.   On: 01/30/2021 23:28   ECHOCARDIOGRAM LIMITED  Result Date: 02/01/2021    ECHOCARDIOGRAM REPORT   Patient Name:   Randy HALLECKDate of Exam: 02/01/2021 Medical Rec #:  [id number]       Height:       66.0 in Accession #:    [id number]      Weight:       318.1 lb Date of Birth:  322-Mar-1967        BSA:          2.436 m Patient Age:    89years         BP:           141/95 mmHg Patient Gender: M                HR:           94 bpm. Exam Location:  Inpatient Procedure: 3D Echo, Limited Echo, Cardiac Doppler and Color Doppler Indications:    I31.3 Pericardial effusion (noninflammatory)  History:        Patient has prior history of Echocardiogram examinations, most                 recent 12/08/2017. CHF, Signs/Symptoms:Dyspnea, Shortness of                 Breath and Chest Pain; Risk Factors:Current Smoker, Hypertension                 and Sleep Apnea. Covid positive. Pericardial effusion.  Sonographer:    TRoseanna RainbowRDCS Referring Phys: [phone] Dr. Cayetano [facility]  Sonographer Comments: Limited echo for effusion. IMPRESSIONS  1. COmpared to echo report from 2019, LVEF is now depressed.  2. Left ventricular ejection fraction, by estimation, is 35 to 40%. The left ventricle has moderately decreased function. The left ventricle demonstrates global hypokinesis. There is severe left ventricular hypertrophy. Indeterminate diastolic filling due to E-A fusion.  3. Right ventricular systolic function is low normal. The right ventricular size is normal. There is  moderately elevated pulmonary artery systolic pressure.  4. Moderate pericardial effusion.  5. The mitral valve is normal in structure. Mild mitral valve regurgitation.  6. The aortic valve is tricuspid. Aortic valve regurgitation is not visualized. Aortic valve sclerosis/calcification is present, without any evidence of aortic stenosis.  7. The inferior vena cava is dilated in size with <50% respiratory variability, suggesting right atrial pressure of 15 mmHg. FINDINGS  Left Ventricle: Left ventricular ejection fraction, by estimation, is 35 to 40%. The left ventricle has moderately decreased function. The left ventricle demonstrates global hypokinesis. The left ventricular internal cavity size was normal in size. There is severe left ventricular hypertrophy. Indeterminate diastolic filling due to E-A fusion. Right Ventricle: The right ventricular size is normal. Right vetricular wall thickness was not assessed. Right ventricular systolic function is low normal. There is moderately elevated pulmonary artery systolic pressure. The tricuspid regurgitant velocity is 3.02 m/s, and with an assumed right atrial pressure of 15 mmHg, the estimated right ventricular systolic pressure is 27.0 mmHg. Left Atrium: Left atrial size was normal in size. Right Atrium: Right atrial size was normal in size. Pericardium: A moderately sized pericardial effusion is present. Mitral Valve: The mitral valve is normal in structure. Mild mitral valve regurgitation. Tricuspid Valve: The tricuspid valve is normal in structure. Tricuspid valve regurgitation is mild. Aortic Valve: The aortic valve is tricuspid. Aortic valve regurgitation is not visualized. Aortic valve sclerosis/calcification is present, without any evidence of aortic stenosis. Pulmonic Valve: The pulmonic valve was grossly normal. Aorta: The aortic root and ascending aorta are structurally normal, with no evidence of dilitation. Venous: The inferior vena cava is dilated in size  with less than 50% respiratory variability, suggesting right atrial pressure of 15 mmHg. IAS/Shunts: No atrial level shunt detected by color flow Doppler.  LEFT VENTRICLE PLAX 2D LVIDd:         5.00 cm LVIDs:         3.90 cm LV PW:         1.70 cm LV IVS:        1.40 cm  LV Volumes (MOD) LV vol d, MOD A2C: 112.0 ml LV vol d, MOD A4C: 116.0 ml LV vol s, MOD A2C: 71.9 ml LV vol s, MOD A4C: 74.8 ml LV SV MOD A2C:     40.1 ml LV SV MOD A4C:     116.0 ml LV SV MOD BP:      45.2 ml IVC IVC diam: 3.00 cm LEFT ATRIUM         Index LA diam:    4.30 cm 1.77 cm/m   AORTA Ao Root diam: 3.50 cm Ao Asc diam:  3.50 cm TRICUSPID VALVE TR Peak grad:   36.5 mmHg TR Vmax:        302.00 cm/s Dorris Carnes MD Electronically signed by Dorris Carnes MD Signature Date/Time: 02/01/2021/3:06:36 PM    Final      Assessment and Plan:   Acute diastolic CHF with pericardial effusion:  Echo completed 02/01/21 showed EF 35-40%, moderately decreased LV function and  LV global hypokinesis. Moderately elevated pulmonary artery systolic pressure. Moderate pericardial effusion.  BNP 175.4. HSTN 19>>21 - Echo in 2019 showed normal LV function,  EF 55-60%, trivial pericardial effusion - CT chest showed moderate pericardial effusion on 1/4, may be secondary to COVID - TSH within normal limits  - Slight elevation in troponin likely due to CHF and poor renal function rather than ACS, could possibly be related to myocarditis in the setting of COVID. Pericarditis unlikely as patient is not having left chest pain.   - Patient has been on IV diuresis with lasix 21m BID. Creatinine remains stable at 1.6. Weight unchanged. Net 0.8 L fluids, but has output 2.850L today  - Continue current dose of diuretics. Continue to monitor fluid status and creatinine as to not over diurese.  - Consider right and left heart cath to assess coronary artery health, fluid status, pulmonary HTN. However, patient is currently COVID positive and has poor renal function so likely  will not be completed at this time  - Consider cardiac MRI to further assess causes of pericardial effusion and CHF. However, this is not urgent and can be completed outpatient if necessary due to CFaxon- Patient currently takes hydrochlorothiazide for BP, will need to optimize GDMT - Continue strict I/Os and daily weights   OSA  - Patient reports that he went to 2 sleep studies and was told he needed a CPAP machine, patient attempted to pick up the machine but was unable to  - Encourage CPAP use while inpatient   HTN  - Home meds include hydrochlorothiazide, currently on hold as patient is on lasix - Will discontinue hydrochlorothiazide long term to allow room for GDMT   AKI - Creatinine elevated to 1.6 on admission, so far stable on diuresis  - Continue to monitor as patient is diuresed. Hold nephrotoxic agents until improved   Tobacco use - Educated patient on the effects of tobacco use on the heart and encouraged smoking cessation.   Risk Assessment/Risk Scores:  { New York Heart Association (NYHA) Functional Class NYHA Class II        For questions or updates, please contact [facility] Please consult [email or web address] for contact info under    Signed, KMargie Billet PA-C  02/01/2021 5:32 PM

## 2021-02-01 NOTE — Progress Notes (Signed)
°  Echocardiogram 2D Echocardiogram has been performed.  Randy Morgan 02/01/2021, 10:50 AM

## 2021-02-01 NOTE — Progress Notes (Signed)
Heart Failure Nurse Navigator Progress Note  PCP: Vevelyn Francois, NP PCP-Cardiologist: None Admission Diagnosis: COVID-19, resp fail with hypoxia, chf exac Admitted from: home with spouse  Presentation:   Randy Morgan presented 1/3 with increased SOB. Pt sitting at EOB. Patient interactive with interview process. Pt states he has gained over 100-150# in the last two years after starting his BP med. Pt states he takes his blood pressure medication as prescribed, but has told other staff he was not taking medications prior to hospitalization. Pt has been out of work for the past two weeks and is worried about finances. Pt works as a Freight forwarder. Drinks 6-8 bottles of water a day and one ginger ale, explained fluid modifications. Pt works odd hours and gets 2+ sausage egg and cheese biscuits for breakfast from [employer], uses air fryer for most home cooked meals and a low sodium all-in-one seasoning. Pt planning to quit smoking--encouraged cessation.  States he has completed 2 sleep studies but had difficulty obtaining CPAP machine, he was given and fitted for the mask and tubing---more recently a friend let him borrow one. States he had the best sleep of his life while wearing the CPAP. Sleeping in a recliner.   ECHO/ LVEF: 35-40% 2019: 55-60%  Clinical Course:  Past Medical History:  Diagnosis Date   Asthma    CHF (congestive heart failure) (HCC)    Hypertension    Sleep apnea    hasn't started CPAP yet     Social History   Socioeconomic History   Marital status: Married    Spouse name: Not on file   Number of children: 2   Years of education: 12th grade + 2 years college   Highest education level: Some college, no degree  Occupational History   Occupation: makes concrete blocks and bricks   Occupation: Freight forwarder  Tobacco Use   Smoking status: Some Days    Packs/day: 0.50    Years: 25.00    Pack years: 12.50    Types: Cigarettes   Smokeless tobacco: Former   Scientific laboratory technician Use: Never used  Substance and Sexual Activity   Alcohol use: Not Currently    Alcohol/week: 2.0 standard drinks    Types: 2 Cans of beer per week    Comment: occ beer   Drug use: No   Sexual activity: Not Currently  Other Topics Concern   Not on file  Social History Narrative   Lives with his wife and daughter.   Other child lives independently in [place].   Drinks about 3 sodas a day    Social Determinants of Radio broadcast assistant Strain: High Risk   Difficulty of Paying Living Expenses: Hard  Food Insecurity: No Food Insecurity   Worried About Charity fundraiser in the Last Year: Never true   Ran Out of Food in the Last Year: Never true  Transportation Needs: No Transportation Needs   Lack of Transportation (Medical): No   Lack of Transportation (Non-Medical): No  Physical Activity: Not on file  Stress: Not on file  Social Connections: Not on file    High Risk Criteria for Readmission and/or Poor Patient Outcomes: Heart failure hospital admissions (last 6 months): 1  No Show rate: NA Difficult social situation: no Demonstrates medication adherence: Questionable in setting of HTN Primary Language: English Literacy level: able to read/write and comprehend  Barriers of Care:   -new dx -medication compliance -sodium/fluid modifications  Considerations/Referrals:   Referral made  to Heart Failure Pharmacist Stewardship: yes, appreciated Referral made to Heart Failure CSW/NCM TOC: yes, appreciated Referral made to Heart & Vascular TOC clinic: yes, 1/13 @ 10AM  Items for Follow-up on DC/TOC: -optimize -CPAP? -medication cost -smoking cessation -cont sodium/fluid modifications   Pricilla Holm, MSN, RN Heart Failure Nurse Navigator [phone]

## 2021-02-01 NOTE — Progress Notes (Signed)
°  Echocardiogram 2D Echocardiogram has been performed.  Randy Morgan 02/01/2021, 10:53 AM

## 2021-02-02 ENCOUNTER — Encounter (HOSPITAL_COMMUNITY): Payer: Self-pay | Admitting: Internal Medicine

## 2021-02-02 DIAGNOSIS — I5041 Acute combined systolic (congestive) and diastolic (congestive) heart failure: Secondary | ICD-10-CM | POA: Diagnosis not present

## 2021-02-02 DIAGNOSIS — I10 Essential (primary) hypertension: Secondary | ICD-10-CM | POA: Diagnosis not present

## 2021-02-02 DIAGNOSIS — I3139 Other pericardial effusion (noninflammatory): Secondary | ICD-10-CM

## 2021-02-02 DIAGNOSIS — I429 Cardiomyopathy, unspecified: Secondary | ICD-10-CM | POA: Diagnosis not present

## 2021-02-02 DIAGNOSIS — G4733 Obstructive sleep apnea (adult) (pediatric): Secondary | ICD-10-CM

## 2021-02-02 DIAGNOSIS — I38 Endocarditis, valve unspecified: Secondary | ICD-10-CM

## 2021-02-02 DIAGNOSIS — U071 COVID-19: Secondary | ICD-10-CM | POA: Diagnosis not present

## 2021-02-02 LAB — BASIC METABOLIC PANEL
Anion gap: 8 (ref 5–15)
BUN: 23 mg/dL — ABNORMAL HIGH (ref 6–20)
CO2: 32 mmol/L (ref 22–32)
Calcium: 8.4 mg/dL — ABNORMAL LOW (ref 8.9–10.3)
Chloride: 100 mmol/L (ref 98–111)
Creatinine, Ser: 1.31 mg/dL — ABNORMAL HIGH (ref 0.61–1.24)
GFR, Estimated: >60 mL/min (ref >60)
Glucose, Bld: 180 mg/dL — ABNORMAL HIGH (ref 70–99)
Potassium: 3.6 mmol/L (ref 3.5–5.1)
Sodium: 140 mmol/L (ref 135–145)

## 2021-02-02 LAB — C-REACTIVE PROTEIN: CRP: 2.6 mg/dL — ABNORMAL HIGH (ref <1.0)

## 2021-02-02 MED ORDER — METOPROLOL SUCCINATE ER 25 MG PO TB24
25.0000 mg | ORAL_TABLET | Freq: Every day | ORAL | Status: DC
  Administered 2021-02-02 – 2021-02-04 (×3): 25 mg via ORAL
  Filled 2021-02-02 (×3): qty 1

## 2021-02-02 MED ORDER — LOSARTAN POTASSIUM 25 MG PO TABS
25.0000 mg | ORAL_TABLET | Freq: Every day | ORAL | Status: DC
  Administered 2021-02-02 – 2021-02-04 (×3): 25 mg via ORAL
  Filled 2021-02-02 (×3): qty 1

## 2021-02-02 NOTE — TOC Initial Note (Signed)
Transition of Care [facility]) - Initial/Assessment Note    Patient Details  Name: Randy Morgan MRN: [id number] Date of Birth: [birth year]  Transition of Care [facility]) CM/SW Contact:    Erenest Rasher, RN Phone Number: [phone] 02/02/2021, 4:17 PM  Clinical Narrative:                 HF TOC CM spoke to pt and states his PCP, Dionisio David NP was working on CPAP. Contacted office and PCP states they sent to his insurance, CPAP was denied. CPAP was ordered by attending. Contacted [employer] for CPAP. Pt states his friend let him borrow a device, he has mask and hose from his sleep study fitting.   Expected Discharge Plan: Home/Self Care Barriers to Discharge: Continued Medical Work up   Patient Goals and CMS Choice     Choice offered to / list presented to : Patient  Expected Discharge Plan and Services Expected Discharge Plan: Home/Self Care In-house Referral: Clinical Social Work Discharge Planning Services: CM Consult Post Acute Care Choice: Durable Medical Equipment   Prior Living Arrangements/Services   Lives with:: Spouse   Do you feel safe going back to the place where you live?: Yes      Need for Family Participation in Patient Care: No (Comment) Care giver support system in place?: No (comment)   Criminal Activity/Legal Involvement Pertinent to Current Situation/Hospitalization: No - Comment as needed  Activities of Daily Living Home Assistive Devices/Equipment: None ADL Screening (condition at time of admission) Patient's cognitive ability adequate to safely complete daily activities?: Yes Is the patient deaf or have difficulty hearing?: No Does the patient have difficulty seeing, even when wearing glasses/contacts?: No Does the patient have difficulty concentrating, remembering, or making decisions?: No Patient able to express need for assistance with ADLs?: Yes Does the patient have difficulty dressing or bathing?: No Independently performs ADLs?: Yes  (appropriate for developmental age) Does the patient have difficulty walking or climbing stairs?: No Weakness of Legs: None Weakness of Arms/Hands: None  Permission Sought/Granted Permission sought to share information with : Case Manager, Family Supports, PCP Permission granted to share information with : Yes, Verbal Permission Granted  Share Information with NAME: Lister Brizzi  Permission granted to share info w AGENCY: DME  Permission granted to share info w Relationship: wife  Permission granted to share info w Contact Information: [phone]  Emotional Assessment   Attitude/Demeanor/Rapport: Gracious Affect (typically observed): Accepting Orientation: : Oriented to Self, Oriented to Place, Oriented to  Time, Oriented to Situation   Psych Involvement: No (comment)  Admission diagnosis:  SOB (shortness of breath) [R06.02] Acute respiratory failure with hypoxia ([place]) [J96.01] COVID-19 virus infection [U07.1] COVID-19 [O87.8] Acute diastolic (congestive) heart failure (HCC) [I50.31] Patient Active Problem List   Diagnosis Date Noted   COVID-19 virus infection 01/31/2021   Pericardial effusion 67/67/2094   Acute diastolic (congestive) heart failure ([place]) 01/31/2021   Precordial chest pain 09/13/2020   Sleep apnea 09/13/2020   Pseudopolyposis of colon without complication ([place]) 70/96/2836   Hyperglycemia 12/08/2017   Pre-diabetes 12/08/2017   Suspected sleep apnea 62/94/7654   Acute diastolic CHF (congestive heart failure) ([place]) 12/08/2017   Hypermagnesemia 12/08/2017   Hyperphosphatemia 12/08/2017   Acute respiratory failure with hypoxia ([place]) 12/07/2017   Hemoptysis 12/07/2017   Acute respiratory failure ([place]) 12/07/2017   Left arm weakness 12/19/2015   BMI 45.0-49.9, adult ([place]) 12/15/2015   Essential hypertension, benign 12/15/2015   Smoker 12/15/2015   PCP:  Vevelyn Francois,  NP Pharmacy:   [facility] [place] [place] - [address] [place] [address] Phone: [phone] Fax: [phone]     Social Determinants of Health (SDOH) Interventions Food Insecurity Interventions: Intervention Not Indicated Financial Strain Interventions: Intervention Not Indicated Housing Interventions: Intervention Not Indicated Transportation Interventions: Intervention Not Indicated  Readmission Risk Interventions No flowsheet data found.

## 2021-02-02 NOTE — Progress Notes (Signed)
[facility] PROGRESS NOTE   Randy Morgan WNU:[id number] DOB: 1965/12/15 DOA: 01/30/2021  PCP: Vevelyn Francois, NP  Brief History/Interval Summary: 56 y.o. male with a past medical history of essential hypertension, chronic diastolic CHF, obstructive sleep apnea, who was in his usual state of health till about a week prior to his presentation when he started developing shortness of breath mainly with exertion.  Also mention history suggestive of orthopnea and PND.  Presented to ED with with these complaints.  Was found to be positive for COVID-19.  Concern for CHF was present.  Patient was hospitalized for further management   Reason for Visit: Acute CHF, likely diastolic  Consultants: None  Procedures: Transthoracic echocardiogram    Subjective/Interval History: Patient mentions that he is improving with respect to shortness of breath.  Swelling persists.  Denies any chest pain.  No nausea vomiting.  Urinating well.   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  Trivial pericardial effusion was noted at that time. Patient presented with symptoms of orthopnea PND and lower extremity edema. Echocardiogram was repeated which shows that his EF is now more depressed than before.  Ejection fraction was noted to be 35 to 40%.  Severe LVH was noted. Cardiology was consulted.  Patient being continued on IV diuretics.  Ischemic work-up to be deferred to a later date.   Pericardial effusion was noted to be moderate in size.  May need follow-up imaging in the next few months.  No evidence for tamponade. TSH was normal. Patient also underwent CT angiogram at the time of admission which did not show any PE.  Mildly elevated troponin levels were noted. He is diuresing with negative fluid balance of 2.5 L.   Continue strict ins and outs and daily weights. Started on ARB and  beta-blocker today.  COVID-19 infection No opacities noted on chest x-ray or CT angiogram.  CRP is very minimally elevated.  We will let him complete a 5-day course of Remdesivir.  No indication for steroids.  Has been weaned off of oxygen.  Mobilize.  Acute respiratory failure with hypoxia Most likely due to fluid overload from CHF Seems like he has been weaned off of oxygen this morning.  Continue to monitor.  Acute kidney injury Likely cardiorenal.  Improved with diuresis.  Down to 1.3 today.  Continue to monitor.  Unclear if he has an element of chronic kidney disease or not.    Prediabetes HbA1c 6.1.  Check glucose levels daily.  Obstructive sleep apnea CPAP continue CPAP.  Will need outpatient follow-up for same as he is using somebody else's machine at home.  Essential hypertension Occasional high readings noted.  His home medications include hydrochlorothiazide which is currently on hold as he is on furosemide. To be started on ARB today by cardiology.  Also started on beta-blocker.  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  It appears to have grown in size.  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  Patient may benefit from being seen by orthopedics in the outpatient setting.  He understands the need for close follow-up  regarding this condition.   Morbid obesity: Estimated body mass index is 51.49 kg/m as calculated from the following:   Height as of this encounter: 5\' 6"  (1.676 m).   Weight as of this encounter: 144.7 kg.   DVT Prophylaxis: Subcutaneous heparin Code Status: Full code Family Communication: Discussed with patient Disposition Plan: Hopefully return home when improved  Status is:  Inpatient  Remains inpatient appropriate because: CHF exacerbation    Medications: Scheduled:  furosemide  60 mg Intravenous Q12H   heparin  5,000 Units Subcutaneous Q8H   losartan  25 mg Oral Daily   metoprolol succinate  25 mg Oral Daily   sodium chloride flush  3 mL Intravenous Q12H   Continuous:  sodium chloride     remdesivir 100 mg in NS 100 mL Stopped (02/01/21 1019)   GHW:EXHBZJ chloride, acetaminophen, albuterol, ondansetron (ZOFRAN) IV, sodium chloride flush  Antibiotics: Anti-infectives (From admission, onward)    Start     Dose/Rate Route Frequency Ordered Stop   02/01/21 1000  remdesivir 100 mg in sodium chloride 0.9 % 100 mL IVPB       See Hyperspace for full Linked Orders Report.   100 mg 200 mL/hr over 30 Minutes Intravenous Daily 01/31/21 1637 02/05/21 0959   01/31/21 1730  remdesivir 200 mg in sodium chloride 0.9% 250 mL IVPB       See Hyperspace for full Linked Orders Report.   200 mg 580 mL/hr over 30 Minutes Intravenous Once 01/31/21 1637 01/31/21 1913       Objective:  Vital Signs  Vitals:   02/02/21 0030 02/02/21 0643 02/02/21 0700 02/02/21 0859  BP:  (!) 148/101  (!) 149/102  Pulse: 90 98  100  Resp: 16 18  18   Temp:  98.2 F (36.8 C)  98.4 F (36.9 C)  TempSrc:  Oral  Oral  SpO2: 96% 97%  96%  Weight:   (!) 144.4 kg   Height:        Intake/Output Summary (Last 24 hours) at 02/02/2021 1131 Last data filed at 02/02/2021 0909 Gross per 24 hour  Intake 460 ml  Output 2575 ml  Net -2115 ml    Filed Weights   01/31/21 1433 02/01/21 0510 02/02/21 0700  Weight: (!) 144.7 kg (!) 144.3 kg (!) 144.4 kg    General appearance: Awake alert.  In no distress Resp: Normal effort at rest.  Diminished air entry at the bases.  No wheezing or rhonchi. Cardio: S1-S2 is normal regular.  No S3-S4.  No rubs murmurs or bruit GI: Abdomen is soft.  Nontender nondistended.  Bowel sounds are present normal.  No masses organomegaly Extremities: Improved  pedal edema but still remains 2+. Neurologic: Alert and oriented x3.  No focal neurological deficits.     Lab Results:  Data Reviewed: I have personally reviewed following labs and imaging studies  CBC: Recent Labs  Lab 01/30/21 2305 02/01/21 0045  WBC 5.7 4.6  HGB 14.8 14.7  HCT 44.3 44.7  MCV 87.5 89.8  PLT 205 187     Basic Metabolic Panel: Recent Labs  Lab 01/30/21 2305 02/01/21 0045 02/02/21 0424  NA 139 138 140  K 3.5 3.6 3.6  CL 97* 99 100  CO2 34* 30 32  GLUCOSE 164* 173* 180*  BUN 16 25* 23*  CREATININE 1.60* 1.60* 1.31*  CALCIUM 9.0 8.5* 8.4*     GFR: Estimated Creatinine Clearance: 86.5 mL/min (A) (by C-G formula based on SCr of 1.31 mg/dL (H)).  Liver Function Tests: Recent Labs  Lab 02/01/21 0045  AST 22  ALT 23  ALKPHOS 49  BILITOT 0.5  PROT 6.8  ALBUMIN 3.3*     HbA1C: Recent Labs    02/01/21 0045  HGBA1C 6.1*      Thyroid Function Tests: Recent Labs    02/01/21 0045  TSH 0.502      Recent Results (from the past 240 hour(s))  SARS CORONAVIRUS 2 (TAT 6-24 HRS) Nasopharyngeal Nasopharyngeal Swab     Status: Abnormal   Collection Time: 01/30/21  9:10 PM   Specimen: Nasopharyngeal Swab  Result Value Ref Range Status   SARS Coronavirus 2 POSITIVE (A) NEGATIVE Final    Comment: (NOTE) SARS-CoV-2 target nucleic acids are DETECTED.  The SARS-CoV-2 RNA is generally detectable in upper and lower respiratory specimens during the acute phase of infection. Positive results are indicative of the presence of SARS-CoV-2 RNA. Clinical correlation with patient history and other diagnostic information is  necessary to determine patient infection status. Positive results do not rule out bacterial infection or co-infection with other viruses.  The expected result is Negative.  Fact Sheet for Patients: SugarRoll.be  Fact Sheet for Healthcare Providers: https://www.woods-mathews.com/  This  test is not yet approved or cleared by the [place] FDA and  has been authorized for detection and/or diagnosis of SARS-CoV-2 by FDA under an Emergency Use Authorization (EUA). This EUA will remain  in effect (meaning this test can be used) for the duration of the COVID-19 declaration under Section 564(b)(1) of the Act, 21 U. S.C. section 360bbb-3(b)(1), unless the authorization is terminated or revoked sooner.   Performed at [facility], [place] [address], [place], [place] [address]   Resp Panel by RT-PCR (Flu A&B, Covid) Nasopharyngeal Swab     Status: Abnormal   Collection Time: 01/30/21 11:09 PM   Specimen: Nasopharyngeal Swab; Nasopharyngeal(NP) swabs in vial transport medium  Result Value Ref Range Status   SARS Coronavirus 2 by RT PCR POSITIVE (A) NEGATIVE Final    Comment: (NOTE) SARS-CoV-2 target nucleic acids are DETECTED.  The SARS-CoV-2 RNA is generally detectable in upper respiratory specimens during the acute phase of infection. Positive results are indicative of the presence of the identified virus, but do not rule out bacterial infection or co-infection with other pathogens not detected by the test. Clinical correlation with patient history and other diagnostic information is necessary to determine patient infection status. The expected result is Negative.  Fact Sheet for Patients: EntrepreneurPulse.com.au  Fact Sheet for Healthcare Providers: IncredibleEmployment.be  This test is not yet approved or cleared by the [place] FDA and  has been authorized for detection and/or diagnosis of SARS-CoV-2 by FDA under an Emergency Use Authorization (EUA).  This EUA will remain in effect (meaning this test can be used) for the duration of  the COVID-19 declaration under Section 564(b)(1) of the A ct, 21 U.S.C. section 360bbb-3(b)(1), unless the authorization is terminated or revoked sooner.     Influenza A by PCR  NEGATIVE NEGATIVE Final   Influenza B by PCR NEGATIVE NEGATIVE Final    Comment: (NOTE) The Xpert Xpress SARS-CoV-2/FLU/RSV plus assay is intended as an aid in the diagnosis of influenza from Nasopharyngeal swab specimens and should not be used as a sole basis for treatment. Nasal washings and aspirates are unacceptable for Xpert Xpress SARS-CoV-2/FLU/RSV testing.  Fact Sheet for Patients: EntrepreneurPulse.com.au  Fact Sheet for Healthcare Providers: IncredibleEmployment.be  This test is not yet approved or cleared by  the [employer] and has been authorized for detection and/or diagnosis of SARS-CoV-2 by FDA under an Emergency Use Authorization (EUA). This EUA will remain in effect (meaning this test can be used) for the duration of the COVID-19 declaration under Section 564(b)(1) of the Act, 21 U.S.C. section 360bbb-3(b)(1), unless the authorization is terminated or revoked.  Performed at [facility], [address] [place], [place] [address]        Radiology Studies: ECHOCARDIOGRAM LIMITED  Result Date: 02/01/2021    ECHOCARDIOGRAM REPORT   Patient Name:   Randy Morgan Date of Exam: 02/01/2021 Medical Rec #:  [id number]        Height:       66.0 in Accession #:    [id number]       Weight:       318.1 lb Date of Birth:  1965-07-28         BSA:          2.436 m Patient Age:    79 years         BP:           141/95 mmHg Patient Gender: M                HR:           94 bpm. Exam Location:  Inpatient Procedure: 3D Echo, Limited Echo, Cardiac Doppler and Color Doppler Indications:    I31.3 Pericardial effusion (noninflammatory)  History:        Patient has prior history of Echocardiogram examinations, most                 recent 12/08/2017. CHF, Signs/Symptoms:Dyspnea, Shortness of                 Breath and Chest Pain; Risk Factors:Current Smoker, Hypertension                 and Sleep Apnea. Covid positive. Pericardial  effusion.  Sonographer:    Roseanna Rainbow RDCS Referring Phys: [id number] [facility]  Sonographer Comments: Limited echo for effusion. IMPRESSIONS  1. COmpared to echo report from 2019, LVEF is now depressed.  2. Left ventricular ejection fraction, by estimation, is 35 to 40%. The left ventricle has moderately decreased function. The left ventricle demonstrates global hypokinesis. There is severe left ventricular hypertrophy. Indeterminate diastolic filling due to E-A fusion.  3. Right ventricular systolic function is low normal. The right ventricular size is normal. There is moderately elevated pulmonary artery systolic pressure.  4. Moderate pericardial effusion.  5. The mitral valve is normal in structure. Mild mitral valve regurgitation.  6. The aortic valve is tricuspid. Aortic valve regurgitation is not visualized. Aortic valve sclerosis/calcification is present, without any evidence of aortic stenosis.  7. The inferior vena cava is dilated in size with <50% respiratory variability, suggesting right atrial pressure of 15 mmHg. FINDINGS  Left Ventricle: Left ventricular ejection fraction, by estimation, is 35 to 40%. The left ventricle has moderately decreased function. The left ventricle demonstrates global hypokinesis. The left ventricular internal cavity size was normal in size. There is severe left ventricular hypertrophy. Indeterminate diastolic filling due to E-A fusion. Right Ventricle: The right ventricular size is normal. Right vetricular wall thickness was not assessed. Right ventricular systolic function is low normal. There is moderately elevated pulmonary artery systolic pressure. The tricuspid regurgitant velocity is 3.02 m/s, and with an assumed right atrial pressure of 15 mmHg, the estimated right ventricular systolic pressure is 51.5  mmHg. Left Atrium: Left atrial size was normal in size. Right Atrium: Right atrial size was normal in size. Pericardium: A moderately sized pericardial effusion is  present. Mitral Valve: The mitral valve is normal in structure. Mild mitral valve regurgitation. Tricuspid Valve: The tricuspid valve is normal in structure. Tricuspid valve regurgitation is mild. Aortic Valve: The aortic valve is tricuspid. Aortic valve regurgitation is not visualized. Aortic valve sclerosis/calcification is present, without any evidence of aortic stenosis. Pulmonic Valve: The pulmonic valve was grossly normal. Aorta: The aortic root and ascending aorta are structurally normal, with no evidence of dilitation. Venous: The inferior vena cava is dilated in size with less than 50% respiratory variability, suggesting right atrial pressure of 15 mmHg. IAS/Shunts: No atrial level shunt detected by color flow Doppler.  LEFT VENTRICLE PLAX 2D LVIDd:         5.00 cm LVIDs:         3.90 cm LV PW:         1.70 cm LV IVS:        1.40 cm  LV Volumes (MOD) LV vol d, MOD A2C: 112.0 ml LV vol d, MOD A4C: 116.0 ml LV vol s, MOD A2C: 71.9 ml LV vol s, MOD A4C: 74.8 ml LV SV MOD A2C:     40.1 ml LV SV MOD A4C:     116.0 ml LV SV MOD BP:      45.2 ml IVC IVC diam: 3.00 cm LEFT ATRIUM         Index LA diam:    4.30 cm 1.77 cm/m   AORTA Ao Root diam: 3.50 cm Ao Asc diam:  3.50 cm TRICUSPID VALVE TR Peak grad:   36.5 mmHg TR Vmax:        302.00 cm/s Dorris Carnes MD Electronically signed by Dorris Carnes MD Signature Date/Time: 02/01/2021/3:06:36 PM    Final        LOS: 2 days   [facility] Pager on [email or web address]  02/02/2021, 11:31 AM

## 2021-02-02 NOTE — Progress Notes (Addendum)
Heart Failure Navigation Team Progress Note  PCP: Vevelyn Francois, NP Primary Cardiologist: Buford Dresser, MD Admitted from: home with spouse  Past Medical History:  Diagnosis Date   Asthma    CHF (congestive heart failure) ([place])    Hypertension    Sleep apnea    hasn't started CPAP yet    Social History   Socioeconomic History   Marital status: Married    Spouse name: Not on file   Number of children: 2   Years of education: 12th grade + 2 years college   Highest education level: Some college, no degree  Occupational History   Occupation: makes concrete blocks and bricks   Occupation: Freight forwarder  Tobacco Use   Smoking status: Some Days    Packs/day: 0.50    Years: 25.00    Pack years: 12.50    Types: Cigarettes   Smokeless tobacco: Former  Scientific laboratory technician Use: Never used  Substance and Sexual Activity   Alcohol use: Not Currently    Alcohol/week: 2.0 standard drinks    Types: 2 Cans of beer per week    Comment: occ beer   Drug use: No   Sexual activity: Not Currently  Other Topics Concern   Not on file  Social History Narrative   Lives with his wife and daughter.   Other child lives independently in [place].   Drinks about 3 sodas a day    Social Determinants of Radio broadcast assistant Strain: High Risk   Difficulty of Paying Living Expenses: Hard  Food Insecurity: No Food Insecurity   Worried About Charity fundraiser in the Last Year: Never true   Ran Out of Food in the Last Year: Never true  Transportation Needs: No Transportation Needs   Lack of Transportation (Medical): No   Lack of Transportation (Non-Medical): No  Physical Activity: Not on file  Stress: Not on file  Social Connections: Not on file     Heart & Vascular Transition of Care Clinic follow-up: Scheduled for 02/09/21 at 10am.  Confirmed transportation.  Immediate social needs: financial concerns  HF CSW attempted to speak with Mr. Flax over the phone  due to the nature of Mr. Bartol diagnosis however he reported to be with the nurse and for the CSW to talk at another time. HF CSW will follow up again a bit later.  Consult to [facility] for a CPAP machine at home.  Adelisa Satterwhite, MSW, [place] Heart Failure Social Worker

## 2021-02-02 NOTE — Plan of Care (Signed)
Problem: Respiratory: Goal: Will maintain a patent airway Outcome: Completed/Met   Problem: Respiratory: Goal: Will maintain a patent airway Outcome: Completed/Met   Problem: Coping: Goal: Psychosocial and spiritual needs will be supported Outcome: Completed/Met   Problem: Skin Integrity: Goal: Risk for impaired skin integrity will decrease Outcome: Completed/Met   Problem: Pain Managment: Goal: General experience of comfort will improve Outcome: Completed/Met   Problem: Elimination: Goal: Will not experience complications related to urinary retention Outcome: Completed/Met   Problem: Nutrition: Goal: Adequate nutrition will be maintained Outcome: Completed/Met

## 2021-02-02 NOTE — Plan of Care (Signed)

## 2021-02-02 NOTE — Progress Notes (Signed)
Progress Note  Patient Name: Randy Morgan Date of Encounter: 02/02/2021  [facility] Cardiologist: Buford Dresser, MD   Subjective   Urinating frequently, feels that abdomen is improving but still with significant LE edema. Has mild cough/shortness of breath but this is also improving. Discussed medications today. All questions answered.  Inpatient Medications    Scheduled Meds:  furosemide  60 mg Intravenous Q12H   heparin  5,000 Units Subcutaneous Q8H   losartan  25 mg Oral Daily   metoprolol succinate  25 mg Oral Daily   sodium chloride flush  3 mL Intravenous Q12H   Continuous Infusions:  sodium chloride     remdesivir 100 mg in NS 100 mL Stopped (02/01/21 1019)   PRN Meds: sodium chloride, acetaminophen, albuterol, ondansetron (ZOFRAN) IV, sodium chloride flush   Vital Signs    Vitals:   02/02/21 0030 02/02/21 0643 02/02/21 0700 02/02/21 0859  BP:  (!) 148/101  (!) 149/102  Pulse: 90 98  100  Resp: 16 18  18   Temp:  98.2 F (36.8 C)  98.4 F (36.9 C)  TempSrc:  Oral  Oral  SpO2: 96% 97%  96%  Weight:   (!) 144.4 kg   Height:        Intake/Output Summary (Last 24 hours) at 02/02/2021 1106 Last data filed at 02/02/2021 0909 Gross per 24 hour  Intake 460 ml  Output 2575 ml  Net -2115 ml   Last 3 Weights 02/02/2021 02/01/2021 01/31/2021  Weight (lbs) 318 lb 6.4 oz 318 lb 2 oz 319 lb  Weight (kg) 144.425 kg 144.3 kg 144.697 kg      Telemetry    NSR - Personally Reviewed  ECG    No new since 01/30/21 - Personally Reviewed  Physical Exam   GEN: No acute distress.   Neck: No JVD Cardiac: RRR, no murmurs, rubs, or gallops.  Respiratory: Largely clear, some rhonchi that clear with cough GI: Soft, nontender, non-distended  MS: Bilateral LE firm edema; No deformity. Neuro:  Nonfocal  Psych: Normal affect   Labs    High Sensitivity Troponin:   Recent Labs  Lab 01/31/21 0045 01/31/21 0236  TROPONINIHS 19* 21*     Chemistry Recent Labs   Lab 01/30/21 2305 02/01/21 0045 02/02/21 0424  NA 139 138 140  K 3.5 3.6 3.6  CL 97* 99 100  CO2 34* 30 32  GLUCOSE 164* 173* 180*  BUN 16 25* 23*  CREATININE 1.60* 1.60* 1.31*  CALCIUM 9.0 8.5* 8.4*  PROT  --  6.8  --   ALBUMIN  --  3.3*  --   AST  --  22  --   ALT  --  23  --   ALKPHOS  --  49  --   BILITOT  --  0.5  --   GFRNONAA 51* 51* >60  ANIONGAP 8 9 8     Lipids No results for input(s): CHOL, TRIG, HDL, LABVLDL, LDLCALC, CHOLHDL in the last 168 hours.  Hematology Recent Labs  Lab 01/30/21 2305 02/01/21 0045  WBC 5.7 4.6  RBC 5.06 4.98  HGB 14.8 14.7  HCT 44.3 44.7  MCV 87.5 89.8  MCH 29.2 29.5  MCHC 33.4 32.9  RDW 13.3 13.2  PLT 205 187   Thyroid  Recent Labs  Lab 02/01/21 0045  TSH 0.502    BNP Recent Labs  Lab 01/31/21 0045  BNP 175.4*    DDimer No results for input(s): DDIMER in the last 168 hours.  Radiology    ECHOCARDIOGRAM LIMITED  Result Date: 02/01/2021    ECHOCARDIOGRAM REPORT   Patient Name:   Randy Morgan Date of Exam: 02/01/2021 Medical Rec #:  [id number]        Height:       66.0 in Accession #:    [id number]       Weight:       318.1 lb Date of Birth:  1965/10/19         BSA:          2.436 m Patient Age:    56 years         BP:           141/95 mmHg Patient Gender: M                HR:           94 bpm. Exam Location:  Inpatient Procedure: 3D Echo, Limited Echo, Cardiac Doppler and Color Doppler Indications:    I31.3 Pericardial effusion (noninflammatory)  History:        Patient has prior history of Echocardiogram examinations, most                 recent 12/08/2017. CHF, Signs/Symptoms:Dyspnea, Shortness of                 Breath and Chest Pain; Risk Factors:Current Smoker, Hypertension                 and Sleep Apnea. Covid positive. Pericardial effusion.  Sonographer:    Roseanna Rainbow RDCS Referring Phys: [id number] [facility]  Sonographer Comments: Limited echo for effusion. IMPRESSIONS  1. COmpared to echo report from 2019, LVEF is now  depressed.  2. Left ventricular ejection fraction, by estimation, is 35 to 40%. The left ventricle has moderately decreased function. The left ventricle demonstrates global hypokinesis. There is severe left ventricular hypertrophy. Indeterminate diastolic filling due to E-A fusion.  3. Right ventricular systolic function is low normal. The right ventricular size is normal. There is moderately elevated pulmonary artery systolic pressure.  4. Moderate pericardial effusion.  5. The mitral valve is normal in structure. Mild mitral valve regurgitation.  6. The aortic valve is tricuspid. Aortic valve regurgitation is not visualized. Aortic valve sclerosis/calcification is present, without any evidence of aortic stenosis.  7. The inferior vena cava is dilated in size with <50% respiratory variability, suggesting right atrial pressure of 15 mmHg. FINDINGS  Left Ventricle: Left ventricular ejection fraction, by estimation, is 35 to 40%. The left ventricle has moderately decreased function. The left ventricle demonstrates global hypokinesis. The left ventricular internal cavity size was normal in size. There is severe left ventricular hypertrophy. Indeterminate diastolic filling due to E-A fusion. Right Ventricle: The right ventricular size is normal. Right vetricular wall thickness was not assessed. Right ventricular systolic function is low normal. There is moderately elevated pulmonary artery systolic pressure. The tricuspid regurgitant velocity is 3.02 m/s, and with an assumed right atrial pressure of 15 mmHg, the estimated right ventricular systolic pressure is 67.1 mmHg. Left Atrium: Left atrial size was normal in size. Right Atrium: Right atrial size was normal in size. Pericardium: A moderately sized pericardial effusion is present. Mitral Valve: The mitral valve is normal in structure. Mild mitral valve regurgitation. Tricuspid Valve: The tricuspid valve is normal in structure. Tricuspid valve regurgitation is mild.  Aortic Valve: The aortic valve is tricuspid. Aortic valve regurgitation is not visualized. Aortic valve sclerosis/calcification is present, without any evidence  of aortic stenosis. Pulmonic Valve: The pulmonic valve was grossly normal. Aorta: The aortic root and ascending aorta are structurally normal, with no evidence of dilitation. Venous: The inferior vena cava is dilated in size with less than 50% respiratory variability, suggesting right atrial pressure of 15 mmHg. IAS/Shunts: No atrial level shunt detected by color flow Doppler.  LEFT VENTRICLE PLAX 2D LVIDd:         5.00 cm LVIDs:         3.90 cm LV PW:         1.70 cm LV IVS:        1.40 cm  LV Volumes (MOD) LV vol d, MOD A2C: 112.0 ml LV vol d, MOD A4C: 116.0 ml LV vol s, MOD A2C: 71.9 ml LV vol s, MOD A4C: 74.8 ml LV SV MOD A2C:     40.1 ml LV SV MOD A4C:     116.0 ml LV SV MOD BP:      45.2 ml IVC IVC diam: 3.00 cm LEFT ATRIUM         Index LA diam:    4.30 cm 1.77 cm/m   AORTA Ao Root diam: 3.50 cm Ao Asc diam:  3.50 cm TRICUSPID VALVE TR Peak grad:   36.5 mmHg TR Vmax:        302.00 cm/s Dorris Carnes MD Electronically signed by Dorris Carnes MD Signature Date/Time: 02/01/2021/3:06:36 PM    Final     Cardiac Studies   Echo 02/01/2021   1. Compared to echo report from 2019, LVEF is now depressed.   2. Left ventricular ejection fraction, by estimation, is 35 to 40%. The  left ventricle has moderately decreased function. The left ventricle  demonstrates global hypokinesis. There is severe left ventricular  hypertrophy. Indeterminate diastolic filling  due to E-A fusion.   3. Right ventricular systolic function is low normal. The right  ventricular size is normal. There is moderately elevated pulmonary artery  systolic pressure.   4. Moderate pericardial effusion.   5. The mitral valve is normal in structure. Mild mitral valve  regurgitation.   6. The aortic valve is tricuspid. Aortic valve regurgitation is not  visualized. Aortic valve  sclerosis/calcification is present, without any  evidence of aortic stenosis.   7. The inferior vena cava is dilated in size with <50% respiratory  variability, suggesting right atrial pressure of 15 mmHg.   Patient Profile     56 y.o. male  with a hx of essential HTN, chronic diastolic CHF, obstructive sleep apnea who is being seen 02/01/2021 for the evaluation of CHF at the request of Dr. [place] Pink. Patient is COVID positive (tested on 01/30/21)  Assessment & Plan    Acute systolic and diastolic heart failure Cardiomyopathy, unknown etiology Hypertension Sleep apnea Moderate pericardial effusion LVH -admission weight 144.7 kg, current weight 144.4 kg, but net negative 2.5 L (does not agree with weights). Has had good urine output on IV lasix. Cr improving to 1.31 today -continue diuresis with IV lasix. -with improving Cr and K 3.6, will start losartan today -will also add low dose metoprolol succinate today as he is warm and well perfused -A1c 6.1; would try to add SGLT2i this admission as well -see note 02/01/21; he has LVH and a pericardial effusion. I suspect this is due to his history of hypertension; he endorses that he was not taking medication routinely for a time. However, cannot exclude amyloid. Less likely myocarditis but also cannot be fully excluded. Could consider  cardiac MRI as an outpatient. -we talked about cath/workup for ischemic cause of cardiomyopathy vs medical management. At this time, he would like to work on diuresis and medical management, would consider ischemic workup if EF does not improve on treatment -would use CPAP while admitted. He states that he has a CPAP, mask, etc from a friend but needs a power cord.  -we discussed diet/salt recommendations and long term education re: heart failure. Continue to address.  Covid + -treatment per primary team  For questions or updates, please contact [place] Please consult [email or web address] for contact info under         Signed, Buford Dresser, MD  02/02/2021, 11:06 AM

## 2021-02-02 NOTE — Progress Notes (Signed)
Pt placed on CPAP for the night with previous settings and 6 LT FI02 bleed in

## 2021-02-03 DIAGNOSIS — I3139 Other pericardial effusion (noninflammatory): Secondary | ICD-10-CM | POA: Diagnosis not present

## 2021-02-03 DIAGNOSIS — R7303 Prediabetes: Secondary | ICD-10-CM

## 2021-02-03 DIAGNOSIS — U071 COVID-19: Secondary | ICD-10-CM | POA: Diagnosis not present

## 2021-02-03 DIAGNOSIS — I5041 Acute combined systolic (congestive) and diastolic (congestive) heart failure: Secondary | ICD-10-CM | POA: Diagnosis not present

## 2021-02-03 DIAGNOSIS — R0602 Shortness of breath: Secondary | ICD-10-CM | POA: Diagnosis not present

## 2021-02-03 DIAGNOSIS — Z7189 Other specified counseling: Secondary | ICD-10-CM

## 2021-02-03 DIAGNOSIS — I10 Essential (primary) hypertension: Secondary | ICD-10-CM | POA: Diagnosis not present

## 2021-02-03 LAB — BASIC METABOLIC PANEL
Anion gap: 6 (ref 5–15)
BUN: 19 mg/dL (ref 6–20)
CO2: 32 mmol/L (ref 22–32)
Calcium: 8.7 mg/dL — ABNORMAL LOW (ref 8.9–10.3)
Chloride: 100 mmol/L (ref 98–111)
Creatinine, Ser: 1.25 mg/dL — ABNORMAL HIGH (ref 0.61–1.24)
GFR, Estimated: >60 mL/min (ref >60)
Glucose, Bld: 141 mg/dL — ABNORMAL HIGH (ref 70–99)
Potassium: 3.7 mmol/L (ref 3.5–5.1)
Sodium: 138 mmol/L (ref 135–145)

## 2021-02-03 LAB — LIPID PANEL
Cholesterol: 166 mg/dL (ref 0–200)
HDL: 36 mg/dL — ABNORMAL LOW (ref >40)
LDL Cholesterol: 98 mg/dL (ref 0–99)
Total CHOL/HDL Ratio: 4.6 RATIO
Triglycerides: 160 mg/dL — ABNORMAL HIGH (ref <150)
VLDL: 32 mg/dL (ref 0–40)

## 2021-02-03 LAB — MAGNESIUM: Magnesium: 2.4 mg/dL (ref 1.7–2.4)

## 2021-02-03 MED ORDER — DIPHENHYDRAMINE HCL 25 MG PO CAPS
25.0000 mg | ORAL_CAPSULE | Freq: Three times a day (TID) | ORAL | Status: DC | PRN
  Administered 2021-02-03 (×2): 25 mg via ORAL
  Filled 2021-02-03 (×2): qty 1

## 2021-02-03 MED ORDER — EMPAGLIFLOZIN 10 MG PO TABS
10.0000 mg | ORAL_TABLET | Freq: Every day | ORAL | Status: DC
  Administered 2021-02-03 – 2021-02-04 (×2): 10 mg via ORAL
  Filled 2021-02-03 (×2): qty 1

## 2021-02-03 NOTE — Plan of Care (Signed)

## 2021-02-03 NOTE — Progress Notes (Signed)
Progress Note  Patient Name: Randy Morgan Date of Encounter: 02/03/2021  [facility] Cardiologist: Buford Dresser, MD   Subjective   Feels like he is making excellent urine, notes it is not all being captured. Breathing improving. Spent significant time today at bedside doing heart failure education, especially related to salt and fluid.  Inpatient Medications    Scheduled Meds:  empagliflozin  10 mg Oral Daily   furosemide  60 mg Intravenous Q12H   heparin  5,000 Units Subcutaneous Q8H   losartan  25 mg Oral Daily   metoprolol succinate  25 mg Oral Daily   sodium chloride flush  3 mL Intravenous Q12H   Continuous Infusions:  sodium chloride     PRN Meds: sodium chloride, acetaminophen, albuterol, ondansetron (ZOFRAN) IV, sodium chloride flush   Vital Signs    Vitals:   02/02/21 1718 02/02/21 2009 02/03/21 0442 02/03/21 1149  BP: (!) 151/99 (!) 141/88 (!) 138/91 (!) 138/95  Pulse: 92 83 90 91  Resp: 18   20  Temp: 98 F (36.7 C) 98.4 F (36.9 C) 97.7 F (36.5 C) 98.4 F (36.9 C)  TempSrc: Oral Oral Oral Oral  SpO2:  95% 93% 98%  Weight:   (!) 143.9 kg   Height:        Intake/Output Summary (Last 24 hours) at 02/03/2021 1159 Last data filed at 02/03/2021 0747 Gross per 24 hour  Intake 700 ml  Output 1425 ml  Net -725 ml   Last 3 Weights 02/03/2021 02/02/2021 02/01/2021  Weight (lbs) 317 lb 4.8 oz 318 lb 6.4 oz 318 lb 2 oz  Weight (kg) 143.926 kg 144.425 kg 144.3 kg      Telemetry    NSR - Personally Reviewed  ECG    No new since 01/30/21 - Personally Reviewed  Physical Exam   GEN: Well nourished, well developed in no acute distress NECK: No JVD CARDIAC: regular rhythm, normal S1 and S2, no rubs or gallops. No murmur. VASCULAR: Radial pulses 2+ bilaterally.  RESPIRATORY:  Clear to auscultation without rales, wheezing or rhonchi  ABDOMEN: Soft, non-tender, non-distended MUSCULOSKELETAL:  Moves all 4 limbs independently SKIN: Warm and dry,  bilateral firm but not pitting dependent edema NEUROLOGIC:  No focal neuro deficits noted. PSYCHIATRIC:  Normal affect    Labs    High Sensitivity Troponin:   Recent Labs  Lab 01/31/21 0045 01/31/21 0236  TROPONINIHS 19* 21*     Chemistry Recent Labs  Lab 02/01/21 0045 02/02/21 0424 02/03/21 0327  NA 138 140 138  K 3.6 3.6 3.7  CL 99 100 100  CO2 30 32 32  GLUCOSE 173* 180* 141*  BUN 25* 23* 19  CREATININE 1.60* 1.31* 1.25*  CALCIUM 8.5* 8.4* 8.7*  PROT 6.8  --   --   ALBUMIN 3.3*  --   --   AST 22  --   --   ALT 23  --   --   ALKPHOS 49  --   --   BILITOT 0.5  --   --   GFRNONAA 51* >60 >60  ANIONGAP 9 8 6     Lipids No results for input(s): CHOL, TRIG, HDL, LABVLDL, LDLCALC, CHOLHDL in the last 168 hours.  Hematology Recent Labs  Lab 01/30/21 2305 02/01/21 0045  WBC 5.7 4.6  RBC 5.06 4.98  HGB 14.8 14.7  HCT 44.3 44.7  MCV 87.5 89.8  MCH 29.2 29.5  MCHC 33.4 32.9  RDW 13.3 13.2  PLT 205 187  Thyroid  Recent Labs  Lab 02/01/21 0045  TSH 0.502    BNP Recent Labs  Lab 01/31/21 0045  BNP 175.4*    DDimer No results for input(s): DDIMER in the last 168 hours.   Radiology    No results found.  Cardiac Studies   Echo 02/01/2021   1. Compared to echo report from 2019, LVEF is now depressed.   2. Left ventricular ejection fraction, by estimation, is 35 to 40%. The  left ventricle has moderately decreased function. The left ventricle  demonstrates global hypokinesis. There is severe left ventricular  hypertrophy. Indeterminate diastolic filling  due to E-A fusion.   3. Right ventricular systolic function is low normal. The right  ventricular size is normal. There is moderately elevated pulmonary artery  systolic pressure.   4. Moderate pericardial effusion.   5. The mitral valve is normal in structure. Mild mitral valve  regurgitation.   6. The aortic valve is tricuspid. Aortic valve regurgitation is not  visualized. Aortic valve  sclerosis/calcification is present, without any  evidence of aortic stenosis.   7. The inferior vena cava is dilated in size with <50% respiratory  variability, suggesting right atrial pressure of 15 mmHg.   Patient Profile     56 y.o. male  with a hx of essential HTN, chronic diastolic CHF, obstructive sleep apnea who is being seen 02/01/2021 for the evaluation of CHF at the request of Dr. [place] Pink. Patient is COVID positive (tested on 01/30/21)  Assessment & Plan    Acute systolic and diastolic heart failure Cardiomyopathy, unknown etiology Hypertension Sleep apnea Moderate pericardial effusion LVH -admission weight 144.7 kg, current weight 143.9 kg. I/O not well captured. Has had good urine output on IV lasix. Cr improving to 1.25 today -continue diuresis with IV lasix. -tolerating losartan and metoprolol succinate -adding SGLT2i today. A1c 6.1 -see note 02/01/21; he has LVH and a pericardial effusion. I suspect this is due to his history of hypertension; he endorses that he was not taking medication routinely for a time. However, cannot exclude amyloid. Less likely myocarditis but also cannot be fully excluded. Could consider cardiac MRI as an outpatient. -we talked about cath/workup for ischemic cause of cardiomyopathy vs medical management. At this time, he would like to work on diuresis and medical management, would consider ischemic workup if EF does not improve on treatment -would use CPAP while admitted. He states that he has a CPAP, mask, etc from a friend but needs a power cord.  -we discussed diet/salt recommendations and long term education re: heart failure. Had extensive conversation today re: salt and fluid recommendations -continue IV lasix for today, anticipate change to oral tomorrow  Covid + -treatment per primary team  Time Spent with Patient: I have spent a total of 50 minutes with patient reviewing hospital notes, telemetry, EKGs, labs and examining the patient as well  as establishing an assessment and plan that was discussed with the patient.  > 50% of time was spent in direct patient care.   For questions or updates, please contact [place] Please consult [email or web address] for contact info under        Signed, Buford Dresser, MD  02/03/2021, 11:59 AM

## 2021-02-03 NOTE — Progress Notes (Signed)
[facility] PROGRESS NOTE   Randy Morgan MVH:[id number] DOB: 1965-04-26 DOA: 01/30/2021  PCP: Vevelyn Francois, NP  Brief History/Interval Summary: 56 y.o. male with a past medical history of essential hypertension, chronic diastolic CHF, obstructive sleep apnea, who was in his usual state of health till about a week prior to his presentation when he started developing shortness of breath mainly with exertion.  Also mention history suggestive of orthopnea and PND.  Presented to ED with with these complaints.  Was found to be positive for COVID-19.  Concern for CHF was present.  Patient was hospitalized for further management   Reason for Visit: Acute CHF, likely diastolic  Consultants: None  Procedures: Transthoracic echocardiogram    Subjective/Interval History: Patient mentions that he is feeling better.  No longer short of breath.  Leg swelling is improving.  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  Trivial pericardial effusion was noted at that time. Patient presented with symptoms of orthopnea PND and lower extremity edema. Echocardiogram was repeated which shows that his EF is now more depressed than before.  Ejection fraction was noted to be 35 to 40%.  Severe LVH was noted. Cardiology was consulted.   Patient was continued on IV diuretics.   Ischemic work-up to be deferred to a later date.   Pericardial effusion was noted to be moderate in size.  May need follow-up imaging in the next few months.  No evidence for tamponade. TSH was normal. Patient also underwent CT angiogram at the time of admission which did not show any PE.  Mildly elevated troponin levels were noted. Appears to be diuresing.  It does not appear that ins and outs were charted accurately yesterday.  Weight has been decreasing.  Started on ARB and beta-blocker  by cardiology yesterday.  Wait on their input today.  Patient asking about going home.  COVID-19 infection No opacities noted on chest x-ray or CT angiogram.  CRP is very minimally elevated.  He has completed 4 days of Remdesivir.  Will discontinue since his symptoms are secondary to CHF rather than COVID-19.  There was no indication for steroids.  He has been weaned off of oxygen.    Acute respiratory failure with hypoxia Most likely due to fluid overload from CHF Now saturating well on room air.  Acute kidney injury Likely cardiorenal.  Improved with diuresis.  Creatinine continues to improve.  Monitor urine output.   Unclear if he has an element of chronic kidney disease or not.    Prediabetes HbA1c 6.1.  Check glucose levels daily.  Will need outpatient follow-up for same.  Obstructive sleep apnea CPAP continue CPAP.  Will need outpatient follow-up for same as he is using somebody else's machine at home.  Essential hypertension Occasional high readings noted.  His home medications include hydrochlorothiazide which is currently on hold as he is on furosemide. Stable.  Remains on IV furosemide.  Started on losartan and metoprolol yesterday.  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  It appears to have grown in size.  He also seems to have contractures of his third fourth and fifth digits on the left hand which is also  been ongoing for that period of time.  Patient may benefit from being seen by orthopedics in the outpatient setting.  He understands the need for close follow-up regarding this condition.   Morbid obesity: Estimated body mass index is 51.49 kg/m as calculated from the following:   Height as of this encounter: 5\' 6"  (1.676 m).   Weight as of this encounter: 144.7 kg.   DVT Prophylaxis:  Subcutaneous heparin Code Status: Full code Family Communication: Discussed with patient Disposition Plan: Hopefully return home when improved  Status is: Inpatient  Remains inpatient appropriate because: CHF exacerbation    Medications: Scheduled:  furosemide  60 mg Intravenous Q12H   heparin  5,000 Units Subcutaneous Q8H   losartan  25 mg Oral Daily   metoprolol succinate  25 mg Oral Daily   sodium chloride flush  3 mL Intravenous Q12H   Continuous:  sodium chloride     remdesivir 100 mg in NS 100 mL 100 mg (02/03/21 0828)   XHB:ZJIRCV chloride, acetaminophen, albuterol, ondansetron (ZOFRAN) IV, sodium chloride flush  Antibiotics: Anti-infectives (From admission, onward)    Start     Dose/Rate Route Frequency Ordered Stop   02/01/21 1000  remdesivir 100 mg in sodium chloride 0.9 % 100 mL IVPB       See Hyperspace for full Linked Orders Report.   100 mg 200 mL/hr over 30 Minutes Intravenous Daily 01/31/21 1637 02/05/21 0959   01/31/21 1730  remdesivir 200 mg in sodium chloride 0.9% 250 mL IVPB       See Hyperspace for full Linked Orders Report.   200 mg 580 mL/hr over 30 Minutes Intravenous Once 01/31/21 1637 01/31/21 1913       Objective:  Vital Signs  Vitals:   02/02/21 1141 02/02/21 1718 02/02/21 2009 02/03/21 0442  BP: (!) 153/98 (!) 151/99 (!) 141/88 (!) 138/91  Pulse: 97 92 83 90  Resp: 16 18    Temp: 98.9 F (37.2 C) 98 F (36.7 C) 98.4 F (36.9 C) 97.7 F (36.5 C)  TempSrc: Oral Oral Oral Oral  SpO2: 97%  95% 93%  Weight:    (!) 143.9 kg  Height:        Intake/Output Summary (Last 24 hours) at 02/03/2021 1141 Last data filed at 02/03/2021 0747 Gross per 24 hour  Intake 700 ml  Output 1425 ml  Net -725 ml    Filed Weights   02/01/21 0510 02/02/21 0700 02/03/21 0442  Weight: (!) 144.3 kg (!) 144.4 kg (!) 143.9 kg    General appearance: Awake alert.  In no distress Resp: Improved air entry bilaterally.  Few crackles at the bases.  No  wheezing or rhonchi. Cardio: S1-S2 is normal regular.  No S3-S4.  No rubs murmurs or bruit GI: Abdomen is soft.  Nontender nondistended.  Bowel sounds are present normal.  No masses organomegaly Extremities: Improving bilateral lower extremity edema. Neurologic: Alert and oriented x3.  No focal neurological deficits.     Lab Results:  Data Reviewed: I have personally reviewed following labs and imaging studies  CBC: Recent Labs  Lab 01/30/21 2305 02/01/21 0045  WBC 5.7 4.6  HGB 14.8 14.7  HCT 44.3 44.7  MCV 87.5 89.8  PLT 205 187     Basic Metabolic Panel: Recent Labs  Lab 01/30/21 2305 02/01/21 0045 02/02/21 0424 02/03/21 0327  NA 139 138 140 138  K 3.5 3.6 3.6 3.7  CL 97* 99 100 100  CO2 34* 30 32 32  GLUCOSE  164* 173* 180* 141*  BUN 16 25* 23* 19  CREATININE 1.60* 1.60* 1.31* 1.25*  CALCIUM 9.0 8.5* 8.4* 8.7*     GFR: Estimated Creatinine Clearance: 90.5 mL/min (A) (by C-G formula based on SCr of 1.25 mg/dL (H)).  Liver Function Tests: Recent Labs  Lab 02/01/21 0045  AST 22  ALT 23  ALKPHOS 49  BILITOT 0.5  PROT 6.8  ALBUMIN 3.3*     HbA1C: Recent Labs    02/01/21 0045  HGBA1C 6.1*      Thyroid Function Tests: Recent Labs    02/01/21 0045  TSH 0.502      Recent Results (from the past 240 hour(s))  SARS CORONAVIRUS 2 (TAT 6-24 HRS) Nasopharyngeal Nasopharyngeal Swab     Status: Abnormal   Collection Time: 01/30/21  9:10 PM   Specimen: Nasopharyngeal Swab  Result Value Ref Range Status   SARS Coronavirus 2 POSITIVE (A) NEGATIVE Final    Comment: (NOTE) SARS-CoV-2 target nucleic acids are DETECTED.  The SARS-CoV-2 RNA is generally detectable in upper and lower respiratory specimens during the acute phase of infection. Positive results are indicative of the presence of SARS-CoV-2 RNA. Clinical correlation with patient history and other diagnostic information is  necessary to determine patient infection status. Positive results  do not rule out bacterial infection or co-infection with other viruses.  The expected result is Negative.  Fact Sheet for Patients: SugarRoll.be  Fact Sheet for Healthcare Providers: https://www.woods-mathews.com/  This test is not yet approved or cleared by the [place] FDA and  has been authorized for detection and/or diagnosis of SARS-CoV-2 by FDA under an Emergency Use Authorization (EUA). This EUA will remain  in effect (meaning this test can be used) for the duration of the COVID-19 declaration under Section 564(b)(1) of the Act, 21 U. S.C. section 360bbb-3(b)(1), unless the authorization is terminated or revoked sooner.   Performed at [facility], [place] [address] [place], [place] [address]   Resp Panel by RT-PCR (Flu A&B, Covid) Nasopharyngeal Swab     Status: Abnormal   Collection Time: 01/30/21 11:09 PM   Specimen: Nasopharyngeal Swab; Nasopharyngeal(NP) swabs in vial transport medium  Result Value Ref Range Status   SARS Coronavirus 2 by RT PCR POSITIVE (A) NEGATIVE Final    Comment: (NOTE) SARS-CoV-2 target nucleic acids are DETECTED.  The SARS-CoV-2 RNA is generally detectable in upper respiratory specimens during the acute phase of infection. Positive results are indicative of the presence of the identified virus, but do not rule out bacterial infection or co-infection with other pathogens not detected by the test. Clinical correlation with patient history and other diagnostic information is necessary to determine patient infection status. The expected result is Negative.  Fact Sheet for Patients: EntrepreneurPulse.com.au  Fact Sheet for Healthcare Providers: IncredibleEmployment.be  This test is not yet approved or cleared by the [place] FDA and  has been authorized for detection and/or diagnosis of SARS-CoV-2 by FDA under an Emergency Use Authorization (EUA).  This  EUA will remain in effect (meaning this test can be used) for the duration of  the COVID-19 declaration under Section 564(b)(1) of the A ct, 21 U.S.C. section 360bbb-3(b)(1), unless the authorization is terminated or revoked sooner.     Influenza A by PCR NEGATIVE NEGATIVE Final   Influenza B by PCR NEGATIVE NEGATIVE Final    Comment: (NOTE) The Xpert Xpress SARS-CoV-2/FLU/RSV plus assay is intended as an aid in the diagnosis of influenza from Nasopharyngeal swab specimens and  should not be used as a sole basis for treatment. Nasal washings and aspirates are unacceptable for Xpert Xpress SARS-CoV-2/FLU/RSV testing.  Fact Sheet for Patients: EntrepreneurPulse.com.au  Fact Sheet for Healthcare Providers: IncredibleEmployment.be  This test is not yet approved or cleared by the [place] FDA and has been authorized for detection and/or diagnosis of SARS-CoV-2 by FDA under an Emergency Use Authorization (EUA). This EUA will remain in effect (meaning this test can be used) for the duration of the COVID-19 declaration under Section 564(b)(1) of the Act, 21 U.S.C. section 360bbb-3(b)(1), unless the authorization is terminated or revoked.  Performed at [facility], [address] [place], [place] [address]        Radiology Studies: No results found.     LOS: 3 days   Kelty Szafran [employer] on [email or web address]  02/03/2021, 11:41 AM

## 2021-02-03 NOTE — Plan of Care (Signed)
  Problem: Education: Goal: Knowledge of General Education information will improve Description Including pain rating scale, medication(s)/side effects and non-pharmacologic comfort measures Outcome: Progressing   

## 2021-02-04 DIAGNOSIS — I3139 Other pericardial effusion (noninflammatory): Secondary | ICD-10-CM | POA: Diagnosis not present

## 2021-02-04 DIAGNOSIS — I5041 Acute combined systolic (congestive) and diastolic (congestive) heart failure: Secondary | ICD-10-CM | POA: Diagnosis not present

## 2021-02-04 DIAGNOSIS — I5021 Acute systolic (congestive) heart failure: Secondary | ICD-10-CM

## 2021-02-04 DIAGNOSIS — U071 COVID-19: Secondary | ICD-10-CM | POA: Diagnosis not present

## 2021-02-04 DIAGNOSIS — I10 Essential (primary) hypertension: Secondary | ICD-10-CM | POA: Diagnosis not present

## 2021-02-04 LAB — BASIC METABOLIC PANEL
Anion gap: 10 (ref 5–15)
BUN: 21 mg/dL — ABNORMAL HIGH (ref 6–20)
CO2: 31 mmol/L (ref 22–32)
Calcium: 8.9 mg/dL (ref 8.9–10.3)
Chloride: 99 mmol/L (ref 98–111)
Creatinine, Ser: 1.42 mg/dL — ABNORMAL HIGH (ref 0.61–1.24)
GFR, Estimated: 58 mL/min — ABNORMAL LOW (ref >60)
Glucose, Bld: 130 mg/dL — ABNORMAL HIGH (ref 70–99)
Potassium: 3.5 mmol/L (ref 3.5–5.1)
Sodium: 140 mmol/L (ref 135–145)

## 2021-02-04 MED ORDER — LOSARTAN POTASSIUM 25 MG PO TABS: 25.0000 mg | ORAL_TABLET | Freq: Every day | ORAL | 2 refills | Status: DC

## 2021-02-04 MED ORDER — TORSEMIDE 20 MG PO TABS
20.0000 mg | ORAL_TABLET | Freq: Every day | ORAL | Status: DC
  Administered 2021-02-04: 20 mg via ORAL
  Filled 2021-02-04: qty 1

## 2021-02-04 MED ORDER — METOPROLOL SUCCINATE ER 25 MG PO TB24: 25.0000 mg | ORAL_TABLET | Freq: Every day | ORAL | 2 refills | Status: DC

## 2021-02-04 MED ORDER — TORSEMIDE 20 MG PO TABS: 20.0000 mg | ORAL_TABLET | Freq: Every day | ORAL | 2 refills | Status: DC

## 2021-02-04 MED ORDER — EMPAGLIFLOZIN 10 MG PO TABS: 10.0000 mg | ORAL_TABLET | Freq: Every day | ORAL | 2 refills | Status: DC

## 2021-02-04 MED ORDER — POTASSIUM CHLORIDE CRYS ER 20 MEQ PO TBCR
40.0000 meq | EXTENDED_RELEASE_TABLET | Freq: Once | ORAL | Status: AC
  Administered 2021-02-04: 40 meq via ORAL
  Filled 2021-02-04: qty 2

## 2021-02-04 NOTE — Progress Notes (Signed)
Progress Note  Patient Name: Randy Morgan Date of Encounter: 02/04/2021  [facility] Cardiologist: Buford Dresser, MD   Subjective   No acute events overnight. Tolerated meds. Reviewed heart failure education today.  Inpatient Medications    Scheduled Meds:  empagliflozin  10 mg Oral Daily   heparin  5,000 Units Subcutaneous Q8H   losartan  25 mg Oral Daily   metoprolol succinate  25 mg Oral Daily   potassium chloride  40 mEq Oral Once   sodium chloride flush  3 mL Intravenous Q12H   torsemide  20 mg Oral Daily   Continuous Infusions:  sodium chloride     PRN Meds: sodium chloride, acetaminophen, albuterol, diphenhydrAMINE, ondansetron (ZOFRAN) IV, sodium chloride flush   Vital Signs    Vitals:   02/03/21 1149 02/03/21 2031 02/03/21 2142 02/04/21 0427  BP: (!) 138/95 (!) 157/101 (!) 145/99 (!) 147/90  Pulse: 91 88 86 88  Resp: 20     Temp: 98.4 F (36.9 C) 98.2 F (36.8 C)  98.2 F (36.8 C)  TempSrc: Oral Oral  Oral  SpO2: 98% 100%  95%  Weight:    (!) 141.5 kg  Height:        Intake/Output Summary (Last 24 hours) at 02/04/2021 1215 Last data filed at 02/04/2021 0906 Gross per 24 hour  Intake 480 ml  Output 1950 ml  Net -1470 ml   Last 3 Weights 02/04/2021 02/03/2021 02/02/2021  Weight (lbs) 311 lb 14.4 oz 317 lb 4.8 oz 318 lb 6.4 oz  Weight (kg) 141.477 kg 143.926 kg 144.425 kg      Telemetry    NSR - Personally Reviewed  ECG    No new since 01/30/21 - Personally Reviewed  Physical Exam   GEN: Well nourished, well developed in no acute distress NECK: No JVD appreciated CARDIAC: regular rhythm, normal S1 and S2, no rubs or gallops. No murmur. VASCULAR: Radial pulses 2+ bilaterally.  RESPIRATORY:  Clear to auscultation without rales, wheezing or rhonchi  ABDOMEN: Soft, non-tender, non-distended MUSCULOSKELETAL:  Moves all 4 limbs independently SKIN: Warm and dry, no pitting edema, bilateral mild brawny edema NEUROLOGIC:  No focal neuro  deficits noted. PSYCHIATRIC:  Normal affect    Labs    High Sensitivity Troponin:   Recent Labs  Lab 01/31/21 0045 01/31/21 0236  TROPONINIHS 19* 21*     Chemistry Recent Labs  Lab 02/01/21 0045 02/02/21 0424 02/03/21 0327 02/04/21 0350  NA 138 140 138 140  K 3.6 3.6 3.7 3.5  CL 99 100 100 99  CO2 30 32 32 31  GLUCOSE 173* 180* 141* 130*  BUN 25* 23* 19 21*  CREATININE 1.60* 1.31* 1.25* 1.42*  CALCIUM 8.5* 8.4* 8.7* 8.9  MG  --   --  2.4  --   PROT 6.8  --   --   --   ALBUMIN 3.3*  --   --   --   AST 22  --   --   --   ALT 23  --   --   --   ALKPHOS 49  --   --   --   BILITOT 0.5  --   --   --   GFRNONAA 51* >60 >60 58*  ANIONGAP 9 8 6 10     Lipids  Recent Labs  Lab 02/03/21 0327  CHOL 166  TRIG 160*  HDL 36*  LDLCALC 98  CHOLHDL 4.6    Hematology Recent Labs  Lab 01/30/21 2305  02/01/21 0045  WBC 5.7 4.6  RBC 5.06 4.98  HGB 14.8 14.7  HCT 44.3 44.7  MCV 87.5 89.8  MCH 29.2 29.5  MCHC 33.4 32.9  RDW 13.3 13.2  PLT 205 187   Thyroid  Recent Labs  Lab 02/01/21 0045  TSH 0.502    BNP Recent Labs  Lab 01/31/21 0045  BNP 175.4*    DDimer No results for input(s): DDIMER in the last 168 hours.   Radiology    No results found.  Cardiac Studies   Echo 02/01/2021   1. Compared to echo report from 2019, LVEF is now depressed.   2. Left ventricular ejection fraction, by estimation, is 35 to 40%. The  left ventricle has moderately decreased function. The left ventricle  demonstrates global hypokinesis. There is severe left ventricular  hypertrophy. Indeterminate diastolic filling  due to E-A fusion.   3. Right ventricular systolic function is low normal. The right  ventricular size is normal. There is moderately elevated pulmonary artery  systolic pressure.   4. Moderate pericardial effusion.   5. The mitral valve is normal in structure. Mild mitral valve  regurgitation.   6. The aortic valve is tricuspid. Aortic valve regurgitation is  not  visualized. Aortic valve sclerosis/calcification is present, without any  evidence of aortic stenosis.   7. The inferior vena cava is dilated in size with <50% respiratory  variability, suggesting right atrial pressure of 15 mmHg.   Patient Profile     56 y.o. male  with a hx of essential HTN, chronic diastolic CHF, obstructive sleep apnea who is being seen 02/01/2021 for the evaluation of CHF at the request of Dr. [place] Pink. Patient is COVID positive (tested on 01/30/21)  Assessment & Plan    Acute systolic and diastolic heart failure Cardiomyopathy, unknown etiology Hypertension Sleep apnea Moderate pericardial effusion LVH -admission weight 144.7 kg, current weight 141.5 kg. I/O not well captured. Has had good urine output on IV lasix. Cr up slightly to 1.42 today -tolerating losartan and metoprolol succinate -added SGLT2i the admission. A1c 6.1 -see note 02/01/21; he has LVH and a pericardial effusion. I suspect this is due to his history of hypertension; he endorses that he was not taking medication routinely for a time. However, cannot exclude amyloid. Less likely myocarditis but also cannot be fully excluded. Could consider cardiac MRI as an outpatient. -we talked about cath/workup for ischemic cause of cardiomyopathy vs medical management. At this time, he would like to work on diuresis and medical management, would consider ischemic workup if EF does not improve on treatment -would use CPAP while admitted. He states that he has a CPAP, mask, etc from a friend but needs a power cord.  -we discussed diet/salt recommendations and long term education re: heart failure. Had extensive conversation today re: salt and fluid recommendations -change to oral diuretic today, will start with torsemide 20 mg daily  Covid + -treatment per primary team  We will discuss more re: prevention and management (ex: statins) at follow up, but will focus on heart failure management first.  [facility] will sign off.   Medication Recommendations:   CONTINUE Empagliflozin 10 mg daily Losartan 25 mg daily Metoprolol succinate 25 mg daily Torsemide 20 mg daily  STOP Hctz, ibuprofen (prior home meds)  Other recommendations (labs, testing, etc):  Bmet at follow up Follow up as an outpatient:  He has appt with heart failure impact clinic 02/09/21 and we will have him follow up with me  on 02/23/21 at 9AM  For questions or updates, please contact [place] Please consult [email or web address] for contact info under        Signed, Buford Dresser, MD  02/04/2021, 12:15 PM

## 2021-02-04 NOTE — TOC Progression Note (Addendum)
Transition of Care [facility]) - Progression Note    Patient Details  Name: Lean Fayson MRN: [id number] Date of Birth: 1965/06/17  Transition of Care [facility]) CM/SW Contact  Carles Collet, RN Phone Number: 02/04/2021, 12:25 PM  Clinical Narrative:    Follow up on CPAP. Spoke w [employer]. He states that the last sleep study was 2 years and would not qualify him for new machine. If he had a venous blood gas with PCO2 of 49 or greater [employer] could approve CPAP and deliver one to his house. Otherwise patient will need to follow up with PCP for sleep study and obtaining new CPAP. Attending updated.   Notified patient to follow up w PCP for CPAP, provided with [employer] coupon    Expected Discharge Plan: Home/Self Care Barriers to Discharge: Continued Medical Work up  Expected Discharge Plan and Services Expected Discharge Plan: Home/Self Care In-house Referral: Clinical Social Work Discharge Planning Services: CM Consult Post Acute Care Choice: Durable Medical Equipment                                         Social Determinants of Health (SDOH) Interventions Food Insecurity Interventions: Intervention Not Indicated Financial Strain Interventions: Intervention Not Indicated Housing Interventions: Intervention Not Indicated Transportation Interventions: Intervention Not Indicated  Readmission Risk Interventions No flowsheet data found.

## 2021-02-04 NOTE — Discharge Summary (Signed)
[facility]  Physician Discharge Summary   Patient ID: Randy Morgan MRN: [id number] DOB/AGE: [birth year] 56 y.o.  Admit date: 01/30/2021 Discharge date: 02/04/2021    PCP: Vevelyn Francois, NP  DISCHARGE DIAGNOSES:  Acute combined systolic and diastolic CHF Moderate pericardial effusion COVID-19 infection, incidental Acute respiratory failure with hypoxia, resolved Acute kidney injury, improved Prediabetes Obstructive sleep apnea Essential hypertension Lipoma left forearm   RECOMMENDATIONS FOR OUTPATIENT FOLLOW UP: Needs to be set up with CPAP for obstructive sleep apnea.  May need repeat sleep study Patient told that he will need to see a orthopedic surgeon for further management of the lipoma in the left forearm area Cardiology will arrange outpatient follow-up.  Home Health: None Equipment/Devices: None  CODE STATUS: Full code  DISCHARGE CONDITION: fair  Diet recommendation: Heart healthy  INITIAL HISTORY: 56 y.o. male with a past medical history of essential hypertension, chronic diastolic CHF, obstructive sleep apnea, who was in his usual state of health till about a week prior to his presentation when he started developing shortness of breath mainly with exertion.  Also mention history suggestive of orthopnea and PND.  Presented to ED with with these complaints.  Was found to be positive for COVID-19.  Concern for CHF was present.  Patient was hospitalized for further management    Reason for Visit: Acute CHF, likely diastolic   Consultants: Cardiology   Procedures: Transthoracic echocardiogram   HOSPITAL COURSE:   Acute combined systolic and diastolic CHF with pericardial effusion Previous echocardiogram from 2019 showed normal systolic function.  Trivial pericardial effusion was noted at that time. Patient presented with symptoms of orthopnea PND and lower extremity edema. Echocardiogram was repeated which shows that his EF is now more depressed than  before.  Ejection fraction was noted to be 35 to 40%.  Severe LVH was noted. Cardiology was consulted.   Patient was continued on IV diuretics.   Ischemic work-up to be deferred to a later date.   Pericardial effusion was noted to be moderate in size.  May need follow-up imaging in the next few months.  No evidence for tamponade. TSH was normal. Patient also underwent CT angiogram at the time of admission which did not show any PE.  Mildly elevated troponin levels were noted. Patient was started on IV diuretics and seems to be diuresing well.  Weight has decreased.  Renal function stable.  Started on ARB and beta-blocker by cardiology.  Okay for discharge today.   COVID-19 infection No opacities noted on chest x-ray or CT angiogram.  CRP is very minimally elevated.  Likely incidental finding though it is unclear.  We did give it 4 days of Remdesivir.  There was no indication for steroids.    Acute respiratory failure with hypoxia, resolved Most likely due to fluid overload from CHF Now saturating well on room air.  Acute kidney injury Likely cardiorenal.  Improved with diuresis.  Unclear if he has an element of chronic kidney disease or not.     Prediabetes HbA1c 6.1.  Will need outpatient follow-up for same.   Obstructive sleep apnea CPAP continue CPAP.  Will need outpatient follow-up for same as he is using somebody else's machine at home.  May need another sleep study.  He was also discussed this with his PCP.  Essential hypertension Stable   Swelling left forearm/lipoma This has been ongoing for a year or so.  It looks like he had a MRI of his left elbow in October 2021 which showed this  mass to be a lipoma which measured at that time 9.6 x 5.9 x 2.3 cm.  It appears to have grown in size.  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  Patient may benefit from being seen by orthopedics in  the outpatient setting.  He understands the need for close follow-up regarding this condition.   Morbid obesity: Estimated body mass index is 51.49 kg/m as calculated from the following:   Height as of this encounter: 5\' 6"  (1.676 m).   Weight as of this encounter: 144.7 kg.   Patient is stable.  Okay for discharge home today.   PERTINENT LABS:  The results of significant diagnostics from this hospitalization (including imaging, microbiology, ancillary and laboratory) are listed below for reference.    Microbiology: Recent Results (from the past 240 hour(s))  SARS CORONAVIRUS 2 (TAT 6-24 HRS) Nasopharyngeal Nasopharyngeal Swab     Status: Abnormal   Collection Time: 01/30/21  9:10 PM   Specimen: Nasopharyngeal Swab  Result Value Ref Range Status   SARS Coronavirus 2 POSITIVE (A) NEGATIVE Final    Comment: (NOTE) SARS-CoV-2 target nucleic acids are DETECTED.  The SARS-CoV-2 RNA is generally detectable in upper and lower respiratory specimens during the acute phase of infection. Positive results are indicative of the presence of SARS-CoV-2 RNA. Clinical correlation with patient history and other diagnostic information is  necessary to determine patient infection status. Positive results do not rule out bacterial infection or co-infection with other viruses.  The expected result is Negative.  Fact Sheet for Patients: SugarRoll.be  Fact Sheet for Healthcare Providers: https://www.woods-mathews.com/  This test is not yet approved or cleared by the [place] FDA and  has been authorized for detection and/or diagnosis of SARS-CoV-2 by FDA under an Emergency Use Authorization (EUA). This EUA will remain  in effect (meaning this test can be used) for the duration of the COVID-19 declaration under Section 564(b)(1) of the Act, 21 U. S.C. section 360bbb-3(b)(1), unless the authorization is terminated or revoked sooner.   Performed at  [facility], [place] [address], [place], [place] [address]   Resp Panel by RT-PCR (Flu A&B, Covid) Nasopharyngeal Swab     Status: Abnormal   Collection Time: 01/30/21 11:09 PM   Specimen: Nasopharyngeal Swab; Nasopharyngeal(NP) swabs in vial transport medium  Result Value Ref Range Status   SARS Coronavirus 2 by RT PCR POSITIVE (A) NEGATIVE Final    Comment: (NOTE) SARS-CoV-2 target nucleic acids are DETECTED.  The SARS-CoV-2 RNA is generally detectable in upper respiratory specimens during the acute phase of infection. Positive results are indicative of the presence of the identified virus, but do not rule out bacterial infection or co-infection with other pathogens not detected by the test. Clinical correlation with patient history and other diagnostic information is necessary to determine patient infection status. The expected result is Negative.  Fact Sheet for Patients: EntrepreneurPulse.com.au  Fact Sheet for Healthcare Providers: IncredibleEmployment.be  This test is not yet approved or cleared by the [place] FDA and  has been authorized for detection and/or diagnosis of SARS-CoV-2 by FDA under an Emergency Use Authorization (EUA).  This EUA will remain in effect (meaning this test can be used) for the duration of  the COVID-19 declaration under Section 564(b)(1) of the A ct, 21 U.S.C. section 360bbb-3(b)(1), unless the authorization is terminated or revoked sooner.  Influenza A by PCR NEGATIVE NEGATIVE Final   Influenza B by PCR NEGATIVE NEGATIVE Final    Comment: (NOTE) The Xpert Xpress SARS-CoV-2/FLU/RSV plus assay is intended as an aid in the diagnosis of influenza from Nasopharyngeal swab specimens and should not be used as a sole basis for treatment. Nasal washings and aspirates are unacceptable for Xpert Xpress SARS-CoV-2/FLU/RSV testing.  Fact Sheet for  Patients: EntrepreneurPulse.com.au  Fact Sheet for Healthcare Providers: IncredibleEmployment.be  This test is not yet approved or cleared by the [place] FDA and has been authorized for detection and/or diagnosis of SARS-CoV-2 by FDA under an Emergency Use Authorization (EUA). This EUA will remain in effect (meaning this test can be used) for the duration of the COVID-19 declaration under Section 564(b)(1) of the Act, 21 U.S.C. section 360bbb-3(b)(1), unless the authorization is terminated or revoked.  Performed at [facility], [address], [place], [place] [id number]      Labs:  COVID-19 Labs   Lab Results  Component Value Date   [place] (A) 01/30/2021   SARSCOV2NAA POSITIVE (A) 01/30/2021   [place] Not Detected 04/12/2019   [place] Not Detected 12/15/2018      Basic Metabolic Panel: Recent Labs  Lab 01/30/21 2305 02/01/21 0045 02/02/21 0424 02/03/21 0327 02/04/21 0350  NA 139 138 140 138 140  K 3.5 3.6 3.6 3.7 3.5  CL 97* 99 100 100 99  CO2 34* 30 32 32 31  GLUCOSE 164* 173* 180* 141* 130*  BUN 16 25* 23* 19 21*  CREATININE 1.60* 1.60* 1.31* 1.25* 1.42*  CALCIUM 9.0 8.5* 8.4* 8.7* 8.9  MG  --   --   --  2.4  --    Liver Function Tests: Recent Labs  Lab 02/01/21 0045  AST 22  ALT 23  ALKPHOS 49  BILITOT 0.5  PROT 6.8  ALBUMIN 3.3*    CBC: Recent Labs  Lab 01/30/21 2305 02/01/21 0045  WBC 5.7 4.6  HGB 14.8 14.7  HCT 44.3 44.7  MCV 87.5 89.8  PLT 205 187    BNP: BNP (last 3 results) Recent Labs    01/31/21 0045  BNP 175.4*      IMAGING STUDIES CT Angio Chest Pulmonary Embolism (PE) W or WO Contrast  Result Date: 01/31/2021 CLINICAL DATA:  Productive cough, dyspnea, leg swelling. Pulmonary embolism (PE) suspected, high prob. EXAM: CT ANGIOGRAPHY CHEST WITH CONTRAST TECHNIQUE: Multidetector CT imaging of the chest was performed using the standard  protocol during bolus administration of intravenous contrast. Multiplanar CT image reconstructions and MIPs were obtained to evaluate the vascular anatomy. CONTRAST:  164mL OMNIPAQUE IOHEXOL 350 MG/ML SOLN COMPARISON:  12/06/2017 FINDINGS: Cardiovascular: There is adequate opacification of the pulmonary arterial tree through the segmental level. No intraluminal filling defect identified to suggest acute pulmonary embolism. The central pulmonary arteries are enlarged in keeping with changes of pulmonary arterial hypertension. Mild coronary artery calcification. Mild global cardiomegaly. Moderate pericardial effusion has developed in the interval since prior examination. No CT evidence of cardiac tamponade. The thoracic aorta is unremarkable. Mediastinum/Nodes: No enlarged mediastinal, hilar, or axillary lymph nodes. Thyroid gland, trachea, and esophagus demonstrate no significant findings. Lungs/Pleura: There is central pulmonary vascular congestion. Additionally, there is mild peribronchovascular soft tissue thickening within the perihilar regions bilaterally likely reflecting trace perihilar pulmonary edema. No confluent pulmonary infiltrate. Minimal left basilar atelectasis. No pneumothorax or pleural effusion. No central obstructing mass. Upper Abdomen: Cholelithiasis.  No acute abnormality Musculoskeletal: No acute bone abnormality. No lytic or  blastic bone lesion. Review of the MIP images confirms the above findings. IMPRESSION: No pulmonary embolism. Interval development of a moderate pericardial effusion. No CT evidence of cardiac tamponade. Central pulmonary vascular congestion with trace perihilar pulmonary edema in keeping with mild cardiogenic failure. Mild coronary artery calcification. Mild global cardiomegaly. Morphologic changes in keeping with pulmonary arterial hypertension. Cholelithiasis Electronically Signed   By: Fidela Salisbury M.D.   On: 01/31/2021 03:15   DG Chest Port 1 View  Result Date:  01/30/2021 CLINICAL DATA:  Cough and shortness of breath.  Leg swelling. EXAM: PORTABLE CHEST 1 VIEW COMPARISON:  Chest x-ray 12/09/2017. FINDINGS: Cardiac silhouette is enlarged similar to the prior study. There is no lung infiltrate, pleural effusion or pneumothorax identified. No acute fractures are seen. IMPRESSION: 1. No acute cardiopulmonary process. 2. Stable cardiomegaly. Electronically Signed   By: Ronney Asters M.D.   On: 01/30/2021 23:28   ECHOCARDIOGRAM LIMITED  Result Date: 02/01/2021    ECHOCARDIOGRAM REPORT   Patient Name:   GRANVILLE WHITEFIELD Date of Exam: 02/01/2021 Medical Rec #:  [id number]        Height:       66.0 in Accession #:    [id number]       Weight:       318.1 lb Date of Birth:  1965/11/14         BSA:          2.436 m Patient Age:    29 years         BP:           141/95 mmHg Patient Gender: M                HR:           94 bpm. Exam Location:  Inpatient Procedure: 3D Echo, Limited Echo, Cardiac Doppler and Color Doppler Indications:    I31.3 Pericardial effusion (noninflammatory)  History:        Patient has prior history of Echocardiogram examinations, most                 recent 12/08/2017. CHF, Signs/Symptoms:Dyspnea, Shortness of                 Breath and Chest Pain; Risk Factors:Current Smoker, Hypertension                 and Sleep Apnea. Covid positive. Pericardial effusion.  Sonographer:    Roseanna Rainbow RDCS Referring Phys: [id number] [facility]  Sonographer Comments: Limited echo for effusion. IMPRESSIONS  1. COmpared to echo report from 2019, LVEF is now depressed.  2. Left ventricular ejection fraction, by estimation, is 35 to 40%. The left ventricle has moderately decreased function. The left ventricle demonstrates global hypokinesis. There is severe left ventricular hypertrophy. Indeterminate diastolic filling due to E-A fusion.  3. Right ventricular systolic function is low normal. The right ventricular size is normal. There is moderately elevated pulmonary artery systolic  pressure.  4. Moderate pericardial effusion.  5. The mitral valve is normal in structure. Mild mitral valve regurgitation.  6. The aortic valve is tricuspid. Aortic valve regurgitation is not visualized. Aortic valve sclerosis/calcification is present, without any evidence of aortic stenosis.  7. The inferior vena cava is dilated in size with <50% respiratory variability, suggesting right atrial pressure of 15 mmHg. FINDINGS  Left Ventricle: Left ventricular ejection fraction, by estimation, is 35 to 40%. The left ventricle has moderately decreased function. The left ventricle demonstrates global hypokinesis. The  left ventricular internal cavity size was normal in size. There is severe left ventricular hypertrophy. Indeterminate diastolic filling due to E-A fusion. Right Ventricle: The right ventricular size is normal. Right vetricular wall thickness was not assessed. Right ventricular systolic function is low normal. There is moderately elevated pulmonary artery systolic pressure. The tricuspid regurgitant velocity is 3.02 m/s, and with an assumed right atrial pressure of 15 mmHg, the estimated right ventricular systolic pressure is 94.0 mmHg. Left Atrium: Left atrial size was normal in size. Right Atrium: Right atrial size was normal in size. Pericardium: A moderately sized pericardial effusion is present. Mitral Valve: The mitral valve is normal in structure. Mild mitral valve regurgitation. Tricuspid Valve: The tricuspid valve is normal in structure. Tricuspid valve regurgitation is mild. Aortic Valve: The aortic valve is tricuspid. Aortic valve regurgitation is not visualized. Aortic valve sclerosis/calcification is present, without any evidence of aortic stenosis. Pulmonic Valve: The pulmonic valve was grossly normal. Aorta: The aortic root and ascending aorta are structurally normal, with no evidence of dilitation. Venous: The inferior vena cava is dilated in size with less than 50% respiratory variability,  suggesting right atrial pressure of 15 mmHg. IAS/Shunts: No atrial level shunt detected by color flow Doppler.  LEFT VENTRICLE PLAX 2D LVIDd:         5.00 cm LVIDs:         3.90 cm LV PW:         1.70 cm LV IVS:        1.40 cm  LV Volumes (MOD) LV vol d, MOD A2C: 112.0 ml LV vol d, MOD A4C: 116.0 ml LV vol s, MOD A2C: 71.9 ml LV vol s, MOD A4C: 74.8 ml LV SV MOD A2C:     40.1 ml LV SV MOD A4C:     116.0 ml LV SV MOD BP:      45.2 ml IVC IVC diam: 3.00 cm LEFT ATRIUM         Index LA diam:    4.30 cm 1.77 cm/m   AORTA Ao Root diam: 3.50 cm Ao Asc diam:  3.50 cm TRICUSPID VALVE TR Peak grad:   36.5 mmHg TR Vmax:        302.00 cm/s Dorris Carnes MD Electronically signed by Dorris Carnes MD Signature Date/Time: 02/01/2021/3:06:36 PM    Final     DISCHARGE EXAMINATION: Vitals:   02/03/21 2031 02/03/21 2142 02/04/21 0427 02/04/21 1219  BP: (!) 157/101 (!) 145/99 (!) 147/90 (!) 159/115  Pulse: 88 86 88 93  Resp:    18  Temp: 98.2 F (36.8 C)  98.2 F (36.8 C) 98.1 F (36.7 C)  TempSrc: Oral  Oral Oral  SpO2: 100%  95% 97%  Weight:   (!) 141.5 kg   Height:       General appearance: Awake alert.  In no distress Resp: Clear to auscultation bilaterally.  Normal effort Cardio: S1-S2 is normal regular.  No S3-S4.  No rubs murmurs or bruit GI: Abdomen is soft.  Nontender nondistended.  Bowel sounds are present normal.  No masses organomegaly   DISPOSITION: Home  Discharge Instructions     (HEART FAILURE PATIENTS) Call MD:  Anytime you have any of the following symptoms: 1) 3 pound weight gain in 24 hours or 5 pounds in 1 week 2) shortness of breath, with or without a dry hacking cough 3) swelling in the hands, feet or stomach 4) if you have to sleep on extra pillows at night in order  to breathe.   Complete by: As directed    Call MD for:  difficulty breathing, headache or visual disturbances   Complete by: As directed    Call MD for:  extreme fatigue   Complete by: As directed    Call MD for:  persistant  dizziness or light-headedness   Complete by: As directed    Call MD for:  persistant nausea and vomiting   Complete by: As directed    Call MD for:  severe uncontrolled pain   Complete by: As directed    Call MD for:  temperature >100.4   Complete by: As directed    Diet - low sodium heart healthy   Complete by: As directed    Discharge instructions   Complete by: As directed    Please be sure to take your medications as prescribed.  Please follow-up with your primary care provider regarding CPAP machine.  You may need to undergo another sleep study.   Heart Failure patients record your daily weight using the same scale at the same time of day   Complete by: As directed    Increase activity slowly   Complete by: As directed           Allergies as of 02/04/2021   No Known Allergies      Medication List     STOP taking these medications    hydrochlorothiazide 25 MG tablet Commonly known as: HYDRODIURIL   ibuprofen 200 MG tablet Commonly known as: ADVIL       TAKE these medications    empagliflozin 10 MG Tabs tablet Commonly known as: JARDIANCE Take 1 tablet (10 mg total) by mouth daily.   losartan 25 MG tablet Commonly known as: COZAAR Take 1 tablet (25 mg total) by mouth daily.   metoprolol succinate 25 MG 24 hr tablet Commonly known as: TOPROL-XL Take 1 tablet (25 mg total) by mouth daily.   torsemide 20 MG tablet Commonly known as: DEMADEX Take 1 tablet (20 mg total) by mouth daily.          Follow-up Information     Vevelyn Francois, NP Follow up.   Specialty: Adult Health Nurse Practitioner Contact information: [address] [place] [address] [phone]         Buford Dresser, MD. Go on 02/23/2021.   Specialty: Cardiology Why: Please come to [facility] at [facility] on 02/23/21 at 60 AM. Dr. Judeth Cornfield office is on the second floor of the building. Contact information: [place] [address] [phone]                 TOTAL DISCHARGE TIME: 35 minutes  [facility] Pager on [email or web address]  02/05/2021, 12:23 PM

## 2021-02-08 ENCOUNTER — Telehealth (HOSPITAL_COMMUNITY): Payer: Self-pay | Admitting: *Deleted

## 2021-02-08 NOTE — Telephone Encounter (Signed)
Called to confirm TOC appt for 02/09/21, pt confirmed he will be here and is aware to bring his meds

## 2021-02-09 ENCOUNTER — Encounter (HOSPITAL_COMMUNITY): Payer: Self-pay

## 2021-02-09 ENCOUNTER — Ambulatory Visit (HOSPITAL_COMMUNITY)
Admission: RE | Admit: 2021-02-09 | Discharge: 2021-02-09 | Disposition: A | Discharge status: Home / Self Care | Payer: 59 | Source: Ambulatory Visit | Attending: Internal Medicine | Admitting: Internal Medicine

## 2021-02-09 ENCOUNTER — Other Ambulatory Visit: Payer: Self-pay

## 2021-02-09 VITALS — BP 150/90 | HR 97 | Wt 318.0 lb

## 2021-02-09 DIAGNOSIS — G4733 Obstructive sleep apnea (adult) (pediatric): Secondary | ICD-10-CM | POA: Diagnosis not present

## 2021-02-09 DIAGNOSIS — I5022 Chronic systolic (congestive) heart failure: Secondary | ICD-10-CM | POA: Diagnosis present

## 2021-02-09 DIAGNOSIS — I10 Essential (primary) hypertension: Secondary | ICD-10-CM

## 2021-02-09 DIAGNOSIS — I517 Cardiomegaly: Secondary | ICD-10-CM | POA: Insufficient documentation

## 2021-02-09 LAB — CBC
HCT: 49.5 % (ref 39.0–52.0)
Hemoglobin: 16.4 g/dL (ref 13.0–17.0)
MCH: 29.7 pg (ref 26.0–34.0)
MCHC: 33.1 g/dL (ref 30.0–36.0)
MCV: 89.7 fL (ref 80.0–100.0)
Platelets: 186 10*3/uL (ref 150–400)
RBC: 5.52 MIL/uL (ref 4.22–5.81)
RDW: 12.5 % (ref 11.5–15.5)
WBC: 5.7 10*3/uL (ref 4.0–10.5)
nRBC: 0 % (ref 0.0–0.2)

## 2021-02-09 LAB — BASIC METABOLIC PANEL
Anion gap: 9 (ref 5–15)
BUN: 18 mg/dL (ref 6–20)
CO2: 32 mmol/L (ref 22–32)
Calcium: 9 mg/dL (ref 8.9–10.3)
Chloride: 101 mmol/L (ref 98–111)
Creatinine, Ser: 1.24 mg/dL (ref 0.61–1.24)
GFR, Estimated: >60 mL/min (ref >60)
Glucose, Bld: 145 mg/dL — ABNORMAL HIGH (ref 70–99)
Potassium: 3.6 mmol/L (ref 3.5–5.1)
Sodium: 142 mmol/L (ref 135–145)

## 2021-02-09 NOTE — H&P (View-Only) (Signed)
HEART & VASCULAR TRANSITION OF CARE CONSULT NOTE     Referring Physician: Dr Harrell Gave  Primary Care: Dionisio David NP  Primary Cardiologist: Dr Harrell Gave   HPI: Referred to clinic by Dr Harrell Gave for heart failure consultation.   Randy Morgan is a 56  year old with a history of HTN, OSA, HFpEF, tobacco abuse, and DMII. Dad died from coronary disease at 14 and Mom has heart failure.   Had sleep study 10/2019 AHI 80.with desaturations to 68%. Had CPAP titration but has never received the machine.  Last smoked 01/29/21. Previously smoking 1PPD.   Admitted 01/30/21 with increased dyspnea in the setting of COVID + HFrEF. CRP 4.3>2.6.  TSH 0.5. Hgb A1C 6.1 . He was volume overloaded and diuresed with IV lasix.  CT negative for PE. Started on GDMT. Discharge weight 310 pounds.   Overall feeling fine. Mild sob with brisk walking. Denies PND/Orthopnea. Appetite ok. No fever or chills. He has never received CPAP. Weight at home 302-305 pounds. Taking all medications. His wife daughter and niece live with him. Works as Programmer, systems.   Cardiac Testing  Echo 2019 55-60% Grade I DD Mild LVH. Trivial pericardial effusion.  Echo 01/2021 EF 35-40% Moderate pericardial effusion. Severe LVH.   Review of Systems: [y] = yes, [ ]  = no   General: Weight gain [ ] ; Weight loss [Y ]; Anorexia [ ] ; Fatigue [ ] ; Fever [ ] ; Chills [ ] ; Weakness [Y ]  Cardiac: Chest pain/pressure [ ] ; Resting SOB [ ] ; Exertional SOB [ Y]; Orthopnea [ ] ; Pedal Edema [ ] ; Palpitations [ ] ; Syncope [ ] ; Presyncope [ ] ; Paroxysmal nocturnal dyspnea[ ]   Pulmonary: Cough [ ] ; Wheezing[ ] ; Hemoptysis[ ] ; Sputum [ ] ; Snoring [ ]   GI: Vomiting[ ] ; Dysphagia[ ] ; Melena[ ] ; Hematochezia [ ] ; Heartburn[ ] ; Abdominal pain [ ] ; Constipation [ ] ; Diarrhea [ ] ; BRBPR [ ]   GU: Hematuria[ ] ; Dysuria [ ] ; Nocturia[ ]   Vascular: Pain in legs with walking [ ] ; Pain in feet with lying flat [ ] ; Non-healing sores [ ] ; Stroke [ ] ; TIA [ ] ;  Slurred speech [ ] ;  Neuro: Headaches[ ] ; Vertigo[ ] ; Seizures[ ] ; Paresthesias[ ] ;Blurred vision [ ] ; Diplopia [ ] ; Vision changes [ ]   Ortho/Skin: Arthritis [ ] ; Joint pain [ ] ; Muscle pain [ ] ; Joint swelling [ ] ; Back Pain [ ] ; Rash [ ]   Psych: Depression[ ] ; Anxiety[ ]   Heme: Bleeding problems [ ] ; Clotting disorders [ ] ; Anemia [ ]   Endocrine: Diabetes [ Y]; Thyroid dysfunction[ ]    Past Medical History:  Diagnosis Date   Asthma    CHF (congestive heart failure) ([place])    Hypertension    Sleep apnea    hasn't started CPAP yet    Current Outpatient Medications  Medication Sig Dispense Refill   empagliflozin (JARDIANCE) 10 MG TABS tablet Take 1 tablet (10 mg total) by mouth daily. 30 tablet 2   losartan (COZAAR) 25 MG tablet Take 1 tablet (25 mg total) by mouth daily. 30 tablet 2   metoprolol succinate (TOPROL-XL) 25 MG 24 hr tablet Take 1 tablet (25 mg total) by mouth daily. 30 tablet 2   torsemide (DEMADEX) 20 MG tablet Take 1 tablet (20 mg total) by mouth daily. 30 tablet 2   No current facility-administered medications for this encounter.    No Known Allergies    Social History   Socioeconomic History   Marital status: Married  Spouse name: Not on file   Number of children: 2   Years of education: 12th grade + 2 years college   Highest education level: Some college, no degree  Occupational History   Occupation: makes concrete blocks and bricks   Occupation: Freight forwarder  Tobacco Use   Smoking status: Some Days    Packs/day: 0.50    Years: 25.00    Pack years: 12.50    Types: Cigarettes   Smokeless tobacco: Former  Scientific laboratory technician Use: Never used  Substance and Sexual Activity   Alcohol use: Not Currently    Alcohol/week: 2.0 standard drinks    Types: 2 Cans of beer per week    Comment: occ beer   Drug use: No   Sexual activity: Not Currently  Other Topics Concern   Not on file  Social History Narrative   Lives with his wife and daughter.    Other child lives independently in [place].   Drinks about 3 sodas a day    Social Determinants of Radio broadcast assistant Strain: High Risk   Difficulty of Paying Living Expenses: Hard  Food Insecurity: No Food Insecurity   Worried About Charity fundraiser in the Last Year: Never true   Ran Out of Food in the Last Year: Never true  Transportation Needs: No Transportation Needs   Lack of Transportation (Medical): No   Lack of Transportation (Non-Medical): No  Physical Activity: Not on file  Stress: Not on file  Social Connections: Not on file  Intimate Partner Violence: Not on file      Family History  Problem Relation Age of Onset   Heart disease Mother    Diabetes Mother    Asthma Mother    Heart disease Father    Asthma Sister    Heart disease Brother    Heart attack Brother    Colon cancer Neg Hx    Esophageal cancer Neg Hx    Rectal cancer Neg Hx    Stomach cancer Neg Hx     Vitals:   02/09/21 1103  BP: (!) 150/90  Pulse: 97  SpO2: 97%  Weight: (!) 144.2 kg (318 lb)   Wt Readings from Last 3 Encounters:  02/09/21 (!) 144.2 kg (318 lb)  02/04/21 (!) 141.5 kg (311 lb 14.4 oz)  05/29/20 (!) 141.9 kg (312 lb 12.8 oz)    PHYSICAL EXAM: General:   No respiratory difficulty HEENT: normal Neck: supple. no JVD. Carotids 2+ bilat; no bruits. No lymphadenopathy or thryomegaly appreciated. Cor: PMI nondisplaced. Regular rate & rhythm. No rubs, gallops or murmurs. Lungs: clear Abdomen: obese, soft, nontender, nondistended. No hepatosplenomegaly. No bruits or masses. Good bowel sounds. Extremities: no cyanosis, clubbing, rash, R and LLE trace edema Neuro: alert & oriented x 3, cranial nerves grossly intact. moves all 4 extremities w/o difficulty. Affect pleasant.  ECG: SR 92 bpm    ASSESSMENT & PLAN: HFrEF  Echo EF down 35-40% LVH mod pericardial effusion. EF previously 55% in 2019.  Given RF ( DMII, smoker, family history  coronary disease) would set up  cath.  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  NYHA III. Volume status ok.Continue torsemide.  - Continue Toprol 25 mg daily  - Continue losartan. Anticipate switching to [facility] - MRA- hold off until after cath.  - Continue jardiance 10 mg daily  - Set up Cath  and  will also need CMRI. -  - Will need to follow and optimized GDMT  2. H/O COVID 01/31/20  3. HTN  Uncontrolled.  DIscussed medications compliance.   4. OSA Needs CPAP>>  Has had CPAP titration. Sounds like his insurance changed.  I have reached to PCP for machine.   5. DMII -Hgb A1C 6.1  - Jardiance 10 mg daily . May need SLT2i -Needs follow up with PCP   6. Obesity  Body mass index is 51.33 kg/m. -Discussed portion control   7. Tobacco Abuse -Former heavy smoker.  -Quit 01/29/21 - Encouraged to not smoke.   8.Noncompliance, medications    Cath set up today. Check precath labs.    Referred to HFSW (PCP, Medications, Transportation, ETOH Abuse, Drug Abuse, Insurance, Financial ):  No Refer to Pharmacy: No Refer to Home Health:  No Refer to Advanced Heart Failure Clinic: Yes ---> Dr Haroldine Laws  Refer to General Cardiology: Shared with Dr Harrell Gave.   Follow up  4 weeks.   Darrick Grinder NP-C  3:34 PM  Patient seen and examined with the above-signed Advanced Practice Provider and/or Housestaff. I personally reviewed laboratory data, imaging studies and relevant notes. I independently examined the patient and formulated the important aspects of the plan. I have edited the note to reflect any of my changes or salient points. I have personally discussed the plan with the patient and/or family.  56 y/o male  with tobacco use, DM2, OSA with recent development of systolic HF EF 50-35%   Has family h/o early CAD.   Remains NYHA II-III with dyspnea on mild exertion.   General:  Walked into clinc. No resp difficulty HEENT: normal Neck:  supple. no JVD. Carotids 2+ bilat; no bruits. No lymphadenopathy or thryomegaly appreciated. Cor: PMI nondisplaced. Regular rate & rhythm. No rubs, gallops or murmurs. Lungs: clear Abdomen: obese soft, nontender, nondistended. No hepatosplenomegaly. No bruits or masses. Good bowel sounds. Extremities: no cyanosis, clubbing, rash, edema Neuro: alert & orientedx3, cranial nerves grossly intact. moves all 4 extremities w/o difficulty. Affect pleasant  New-onset systolic HF with multiple CRFS. Agree with R/L cath and titration of GDMT. Needs to get CPAP started.   Glori Bickers, MD  8:44 AM

## 2021-02-09 NOTE — Patient Instructions (Signed)
EKG done today.   Labs done today. We will contact you only if your labs are abnormal.  No other medication changes were made. Please continue all current medications as prescribed.  Your physician has requested that you have a cardiac MRI. Cardiac MRI uses a computer to create images of your heart as its beating, producing both still and moving pictures of your heart and major blood vessels. This has to be approved through your insurance company prior to scheduling, once approved we will contact you to schedule an appointment.   Your physician recommends that you schedule a follow-up appointment in: 4 weeks with Dr. Haroldine Laws.   If you have any questions or concerns before your next appointment please send [place] a message through [place] or call our office at [phone].    TO LEAVE A MESSAGE FOR THE NURSE SELECT OPTION 2, PLEASE LEAVE A MESSAGE INCLUDING: YOUR NAME DATE OF BIRTH CALL BACK NUMBER REASON FOR CALL**this is important as we prioritize the call backs  YOU WILL RECEIVE A CALL BACK THE SAME DAY AS LONG AS YOU CALL BEFORE 4:00 PM   Do the following things EVERYDAY: Weigh yourself in the morning before breakfast. Write it down and keep it in a log. Take your medicines as prescribed Eat low salt foods--Limit salt (sodium) to 2000 mg per day.  Stay as active as you can everyday Limit all fluids for the day to less than 2 liters   At the [facility], you and your health needs are our priority. As part of our continuing mission to provide you with exceptional heart care, we have created designated Provider Care Teams. These Care Teams include your primary Cardiologist (physician) and Advanced Practice Providers (APPs- Physician Assistants and Nurse Practitioners) who all work together to provide you with the care you need, when you need it.   You may see any of the following providers on your designated Care Team at your next follow up: Dr Glori Bickers Dr  Haynes Kerns, NP Lyda Jester, [place] Audry Riles, PharmD   Please be sure to bring in all your medications bottles to every appointment.    You are scheduled for a Cardiac Catheterization on Friday, January 20 with Dr. Glori Bickers.  1. Please arrive at the [facility] (Main Entrance A) at [facility]: [address] [place], [place] [address] at 8:30 AM (This time is two hours before your procedure to ensure your preparation). Free valet parking service is available.   Special note: Every effort is made to have your procedure done on time. Please understand that emergencies sometimes delay scheduled procedures.  2. Diet: Do not eat solid foods after midnight.  The patient may have clear liquids until 5am upon the day of the procedure.  DO NOT take Torsemide the day of your procedure.   Do not take Diabetes Med Jardiance on the day of the procedure and HOLD 48 HOURS AFTER THE PROCEDURE.  On the morning of your procedure, take your and any morning medicines NOT listed above.  You may use sips of water.  5. Plan for one night stay--bring personal belongings. 6. Bring a current list of your medications and current insurance cards. 7. You MUST have a responsible person to drive you home. 8. Someone MUST be with you the first 24 hours after you arrive home or your discharge will be delayed. 9. Please wear clothes that are easy to get on and off and wear slip-on shoes.  Thank you for allowing [place] to care for you!   -- [place] [facility]

## 2021-02-09 NOTE — Progress Notes (Signed)
HEART & VASCULAR TRANSITION OF CARE CONSULT NOTE     Referring Physician: Dr Harrell Gave  Primary Care: Randy David NP  Primary Cardiologist: Randy Harrell Gave   HPI: Referred to clinic by Randy Harrell Gave for heart failure consultation.   Randy Morgan is a 56  year old with a history of HTN, OSA, HFpEF, tobacco abuse, and DMII. Dad died from coronary disease at 76 and Mom has heart failure.   Had sleep study 10/2019 AHI 80.with desaturations to 68%. Had CPAP titration but has never received the machine.  Last smoked 01/29/21. Previously smoking 1PPD.   Admitted 01/30/21 with increased dyspnea in the setting of COVID + HFrEF. CRP 4.3>2.6.  TSH 0.5. Hgb A1C 6.1 . He was volume overloaded and diuresed with IV lasix.  CT negative for PE. Started on GDMT. Discharge weight 310 pounds.   Overall feeling fine. Mild sob with brisk walking. Denies PND/Orthopnea. Appetite ok. No fever or chills. He has never received CPAP. Weight at home 302-305 pounds. Taking all medications. His wife daughter and niece live with him. Works as Programmer, systems.   Cardiac Testing  Echo 2019 55-60% Grade I DD Mild LVH. Trivial pericardial effusion.  Echo 01/2021 EF 35-40% Moderate pericardial effusion. Severe LVH.   Review of Systems: [y] = yes, [ ]  = no   General: Weight gain [ ] ; Weight loss [Y ]; Anorexia [ ] ; Fatigue [ ] ; Fever [ ] ; Chills [ ] ; Weakness [Y ]  Cardiac: Chest pain/pressure [ ] ; Resting SOB [ ] ; Exertional SOB [ Y]; Orthopnea [ ] ; Pedal Edema [ ] ; Palpitations [ ] ; Syncope [ ] ; Presyncope [ ] ; Paroxysmal nocturnal dyspnea[ ]   Pulmonary: Cough [ ] ; Wheezing[ ] ; Hemoptysis[ ] ; Sputum [ ] ; Snoring [ ]   GI: Vomiting[ ] ; Dysphagia[ ] ; Melena[ ] ; Hematochezia [ ] ; Heartburn[ ] ; Abdominal pain [ ] ; Constipation [ ] ; Diarrhea [ ] ; BRBPR [ ]   GU: Hematuria[ ] ; Dysuria [ ] ; Nocturia[ ]   Vascular: Pain in legs with walking [ ] ; Pain in feet with lying flat [ ] ; Non-healing sores [ ] ; Stroke [ ] ; TIA [ ] ;  Slurred speech [ ] ;  Neuro: Headaches[ ] ; Vertigo[ ] ; Seizures[ ] ; Paresthesias[ ] ;Blurred vision [ ] ; Diplopia [ ] ; Vision changes [ ]   Ortho/Skin: Arthritis [ ] ; Joint pain [ ] ; Muscle pain [ ] ; Joint swelling [ ] ; Back Pain [ ] ; Rash [ ]   Psych: Depression[ ] ; Anxiety[ ]   Heme: Bleeding problems [ ] ; Clotting disorders [ ] ; Anemia [ ]   Endocrine: Diabetes [ Y]; Thyroid dysfunction[ ]    Past Medical History:  Diagnosis Date   Asthma    CHF (congestive heart failure) ([place])    Hypertension    Sleep apnea    hasn't started CPAP yet    Current Outpatient Medications  Medication Sig Dispense Refill   empagliflozin (JARDIANCE) 10 MG TABS tablet Take 1 tablet (10 mg total) by mouth daily. 30 tablet 2   losartan (COZAAR) 25 MG tablet Take 1 tablet (25 mg total) by mouth daily. 30 tablet 2   metoprolol succinate (TOPROL-XL) 25 MG 24 hr tablet Take 1 tablet (25 mg total) by mouth daily. 30 tablet 2   torsemide (DEMADEX) 20 MG tablet Take 1 tablet (20 mg total) by mouth daily. 30 tablet 2   No current facility-administered medications for this encounter.    No Known Allergies    Social History   Socioeconomic History   Marital status: Married  Spouse name: Not on file   Number of children: 2   Years of education: 12th grade + 2 years college   Highest education level: Some college, no degree  Occupational History   Occupation: makes concrete blocks and bricks   Occupation: Freight forwarder  Tobacco Use   Smoking status: Some Days    Packs/day: 0.50    Years: 25.00    Pack years: 12.50    Types: Cigarettes   Smokeless tobacco: Former  Scientific laboratory technician Use: Never used  Substance and Sexual Activity   Alcohol use: Not Currently    Alcohol/week: 2.0 standard drinks    Types: 2 Cans of beer per week    Comment: occ beer   Drug use: No   Sexual activity: Not Currently  Other Topics Concern   Not on file  Social History Narrative   Lives with his wife and daughter.    Other child lives independently in [place].   Drinks about 3 sodas a day    Social Determinants of Radio broadcast assistant Strain: High Risk   Difficulty of Paying Living Expenses: Hard  Food Insecurity: No Food Insecurity   Worried About Charity fundraiser in the Last Year: Never true   Ran Out of Food in the Last Year: Never true  Transportation Needs: No Transportation Needs   Lack of Transportation (Medical): No   Lack of Transportation (Non-Medical): No  Physical Activity: Not on file  Stress: Not on file  Social Connections: Not on file  Intimate Partner Violence: Not on file      Family History  Problem Relation Age of Onset   Heart disease Mother    Diabetes Mother    Asthma Mother    Heart disease Father    Asthma Sister    Heart disease Brother    Heart attack Brother    Colon cancer Neg Hx    Esophageal cancer Neg Hx    Rectal cancer Neg Hx    Stomach cancer Neg Hx     Vitals:   02/09/21 1103  BP: (!) 150/90  Pulse: 97  SpO2: 97%  Weight: (!) 144.2 kg (318 lb)   Wt Readings from Last 3 Encounters:  02/09/21 (!) 144.2 kg (318 lb)  02/04/21 (!) 141.5 kg (311 lb 14.4 oz)  05/29/20 (!) 141.9 kg (312 lb 12.8 oz)    PHYSICAL EXAM: General:   No respiratory difficulty HEENT: normal Neck: supple. no JVD. Carotids 2+ bilat; no bruits. No lymphadenopathy or thryomegaly appreciated. Cor: PMI nondisplaced. Regular rate & rhythm. No rubs, gallops or murmurs. Lungs: clear Abdomen: obese, soft, nontender, nondistended. No hepatosplenomegaly. No bruits or masses. Good bowel sounds. Extremities: no cyanosis, clubbing, rash, R and LLE trace edema Neuro: alert & oriented x 3, cranial nerves grossly intact. moves all 4 extremities w/o difficulty. Affect pleasant.  ECG: SR 92 bpm    ASSESSMENT & PLAN: HFrEF  Echo EF down 35-40% LVH mod pericardial effusion. EF previously 55% in 2019.  Given RF ( DMII, smoker, family history  coronary disease) would set up  cath.  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  NYHA III. Volume status ok.Continue torsemide.  - Continue Toprol 25 mg daily  - Continue losartan. Anticipate switching to [facility] - MRA- hold off until after cath.  - Continue jardiance 10 mg daily  - Set up Cath  and  will also need CMRI. -  - Will need to follow and optimized GDMT  2. H/O COVID 01/31/20  3. HTN  Uncontrolled.  DIscussed medications compliance.   4. OSA Needs CPAP>>  Has had CPAP titration. Sounds like his insurance changed.  I have reached to PCP for machine.   5. DMII -Hgb A1C 6.1  - Jardiance 10 mg daily . May need SLT2i -Needs follow up with PCP   6. Obesity  Body mass index is 51.33 kg/m. -Discussed portion control   7. Tobacco Abuse -Former heavy smoker.  -Quit 01/29/21 - Encouraged to not smoke.   8.Noncompliance, medications    Cath set up today. Check precath labs.    Referred to HFSW (PCP, Medications, Transportation, ETOH Abuse, Drug Abuse, Insurance, Financial ):  No Refer to Pharmacy: No Refer to Home Health:  No Refer to Advanced Heart Failure Clinic: Yes ---> Randy Morgan  Refer to General Cardiology: Shared with Randy Harrell Gave.   Follow up  4 weeks.   Randy Grinder NP-C  3:34 PM  Patient seen and examined with the above-signed Advanced Practice Provider and/or Housestaff. I personally reviewed laboratory data, imaging studies and relevant notes. I independently examined the patient and formulated the important aspects of the plan. I have edited the note to reflect any of my changes or salient points. I have personally discussed the plan with the patient and/or family.  56 y/o male  with tobacco use, DM2, OSA with recent development of systolic HF EF 55-37%   Has family h/o early CAD.   Remains NYHA II-III with dyspnea on mild exertion.   General:  Walked into clinc. No resp difficulty HEENT: normal Neck:  supple. no JVD. Carotids 2+ bilat; no bruits. No lymphadenopathy or thryomegaly appreciated. Cor: PMI nondisplaced. Regular rate & rhythm. No rubs, gallops or murmurs. Lungs: clear Abdomen: obese soft, nontender, nondistended. No hepatosplenomegaly. No bruits or masses. Good bowel sounds. Extremities: no cyanosis, clubbing, rash, edema Neuro: alert & orientedx3, cranial nerves grossly intact. moves all 4 extremities w/o difficulty. Affect pleasant  New-onset systolic HF with multiple CRFS. Agree with R/L cath and titration of GDMT. Needs to get CPAP started.   Randy Bickers, MD  8:44 AM

## 2021-02-14 ENCOUNTER — Telehealth (HOSPITAL_COMMUNITY): Payer: Self-pay | Admitting: *Deleted

## 2021-02-14 NOTE — Telephone Encounter (Signed)
Pre cert request for cmri and r/l heart cath faxed to [employer]

## 2021-02-16 ENCOUNTER — Other Ambulatory Visit: Payer: Self-pay

## 2021-02-16 ENCOUNTER — Encounter (HOSPITAL_COMMUNITY)
Admission: RE | Disposition: A | Discharge status: Home / Self Care | Payer: Self-pay | Source: Home / Self Care | Attending: Internal Medicine

## 2021-02-16 ENCOUNTER — Ambulatory Visit (HOSPITAL_COMMUNITY)
Admission: RE | Admit: 2021-02-16 | Discharge: 2021-02-16 | Disposition: A | Discharge status: Home / Self Care | Payer: 59 | Attending: Internal Medicine | Admitting: Internal Medicine

## 2021-02-16 DIAGNOSIS — Z8616 Personal history of COVID-19: Secondary | ICD-10-CM | POA: Insufficient documentation

## 2021-02-16 DIAGNOSIS — Z6841 Body Mass Index (BMI) 40.0 and over, adult: Secondary | ICD-10-CM | POA: Insufficient documentation

## 2021-02-16 DIAGNOSIS — I110 Hypertensive heart disease with heart failure: Secondary | ICD-10-CM | POA: Insufficient documentation

## 2021-02-16 DIAGNOSIS — Z7984 Long term (current) use of oral hypoglycemic drugs: Secondary | ICD-10-CM | POA: Diagnosis not present

## 2021-02-16 DIAGNOSIS — E669 Obesity, unspecified: Secondary | ICD-10-CM | POA: Diagnosis not present

## 2021-02-16 DIAGNOSIS — Z79899 Other long term (current) drug therapy: Secondary | ICD-10-CM | POA: Insufficient documentation

## 2021-02-16 DIAGNOSIS — F17210 Nicotine dependence, cigarettes, uncomplicated: Secondary | ICD-10-CM | POA: Diagnosis not present

## 2021-02-16 DIAGNOSIS — Z8249 Family history of ischemic heart disease and other diseases of the circulatory system: Secondary | ICD-10-CM | POA: Diagnosis not present

## 2021-02-16 DIAGNOSIS — Z9114 Patient's other noncompliance with medication regimen: Secondary | ICD-10-CM | POA: Insufficient documentation

## 2021-02-16 DIAGNOSIS — E119 Type 2 diabetes mellitus without complications: Secondary | ICD-10-CM | POA: Insufficient documentation

## 2021-02-16 DIAGNOSIS — G4733 Obstructive sleep apnea (adult) (pediatric): Secondary | ICD-10-CM | POA: Insufficient documentation

## 2021-02-16 DIAGNOSIS — I428 Other cardiomyopathies: Secondary | ICD-10-CM | POA: Insufficient documentation

## 2021-02-16 DIAGNOSIS — I5022 Chronic systolic (congestive) heart failure: Secondary | ICD-10-CM | POA: Insufficient documentation

## 2021-02-16 HISTORY — PX: RIGHT/LEFT HEART CATH AND CORONARY ANGIOGRAPHY: CATH118266

## 2021-02-16 LAB — POCT I-STAT 7, (LYTES, BLD GAS, ICA,H+H)
Acid-Base Excess: 8 mmol/L — ABNORMAL HIGH (ref 0.0–2.0)
Bicarbonate: 35.5 mmol/L — ABNORMAL HIGH (ref 20.0–28.0)
Calcium, Ion: 1.13 mmol/L — ABNORMAL LOW (ref 1.15–1.40)
HCT: 43 % (ref 39.0–52.0)
Hemoglobin: 14.6 g/dL (ref 13.0–17.0)
O2 Saturation: 97 %
Potassium: 3.2 mmol/L — ABNORMAL LOW (ref 3.5–5.1)
Sodium: 141 mmol/L (ref 135–145)
TCO2: 37 mmol/L — ABNORMAL HIGH (ref 22–32)
pCO2 arterial: 60.5 mmHg — ABNORMAL HIGH (ref 32.0–48.0)
pH, Arterial: 7.376 (ref 7.350–7.450)
pO2, Arterial: 97 mmHg (ref 83.0–108.0)

## 2021-02-16 LAB — POCT I-STAT EG7
Acid-Base Excess: 7 mmol/L — ABNORMAL HIGH (ref 0.0–2.0)
Acid-Base Excess: 9 mmol/L — ABNORMAL HIGH (ref 0.0–2.0)
Acid-base deficit: 4 mmol/L — ABNORMAL HIGH (ref 0.0–2.0)
Bicarbonate: 23.6 mmol/L (ref 20.0–28.0)
Bicarbonate: 33.5 mmol/L — ABNORMAL HIGH (ref 20.0–28.0)
Bicarbonate: 36.3 mmol/L — ABNORMAL HIGH (ref 20.0–28.0)
Calcium, Ion: 0.59 mmol/L — CL (ref 1.15–1.40)
Calcium, Ion: 1.1 mmol/L — ABNORMAL LOW (ref 1.15–1.40)
Calcium, Ion: 1.12 mmol/L — ABNORMAL LOW (ref 1.15–1.40)
HCT: 35 % — ABNORMAL LOW (ref 39.0–52.0)
HCT: 41 % (ref 39.0–52.0)
HCT: 43 % (ref 39.0–52.0)
Hemoglobin: 11.9 g/dL — ABNORMAL LOW (ref 13.0–17.0)
Hemoglobin: 13.9 g/dL (ref 13.0–17.0)
Hemoglobin: 14.6 g/dL (ref 13.0–17.0)
O2 Saturation: 78 %
O2 Saturation: 79 %
O2 Saturation: 80 %
Potassium: 3 mmol/L — ABNORMAL LOW (ref 3.5–5.1)
Potassium: 3.3 mmol/L — ABNORMAL LOW (ref 3.5–5.1)
Potassium: <2 mmol/L — CL (ref 3.5–5.1)
Sodium: 136 mmol/L (ref 135–145)
Sodium: 142 mmol/L (ref 135–145)
Sodium: 144 mmol/L (ref 135–145)
TCO2: 25 mmol/L (ref 22–32)
TCO2: 35 mmol/L — ABNORMAL HIGH (ref 22–32)
TCO2: 38 mmol/L — ABNORMAL HIGH (ref 22–32)
pCO2, Ven: 54.5 mmHg (ref 44.0–60.0)
pCO2, Ven: 56.9 mmHg (ref 44.0–60.0)
pCO2, Ven: 61.4 mmHg — ABNORMAL HIGH (ref 44.0–60.0)
pH, Ven: 7.244 — ABNORMAL LOW (ref 7.250–7.430)
pH, Ven: 7.378 (ref 7.250–7.430)
pH, Ven: 7.381 (ref 7.250–7.430)
pO2, Ven: 45 mmHg (ref 32.0–45.0)
pO2, Ven: 47 mmHg — ABNORMAL HIGH (ref 32.0–45.0)
pO2, Ven: 51 mmHg — ABNORMAL HIGH (ref 32.0–45.0)

## 2021-02-16 LAB — CARDIAC CATHETERIZATION: Cath EF Quantitative: 45 %

## 2021-02-16 SURGERY — RIGHT/LEFT HEART CATH AND CORONARY ANGIOGRAPHY
Anesthesia: LOCAL

## 2021-02-16 MED ORDER — HEPARIN (PORCINE) IN NACL 1000-0.9 UT/500ML-% IV SOLN
INTRAVENOUS | Status: DC | PRN
  Administered 2021-02-16 (×2): 500 mL

## 2021-02-16 MED ORDER — LABETALOL HCL 5 MG/ML IV SOLN
10.0000 mg | INTRAVENOUS | Status: DC | PRN
  Administered 2021-02-16 (×2): 10 mg via INTRAVENOUS
  Filled 2021-02-16: qty 4

## 2021-02-16 MED ORDER — HEPARIN SODIUM (PORCINE) 1000 UNIT/ML IJ SOLN
INTRAMUSCULAR | Status: DC | PRN
  Administered 2021-02-16: 7000 [IU] via INTRAVENOUS

## 2021-02-16 MED ORDER — VERAPAMIL HCL 2.5 MG/ML IV SOLN
INTRAVENOUS | Status: DC | PRN
  Administered 2021-02-16: 10 mL via INTRA_ARTERIAL

## 2021-02-16 MED ORDER — MIDAZOLAM HCL 2 MG/2ML IJ SOLN
INTRAMUSCULAR | Status: AC
  Filled 2021-02-16: qty 2

## 2021-02-16 MED ORDER — ONDANSETRON HCL 4 MG/2ML IJ SOLN: 4.0000 mg | Freq: Four times a day (QID) | INTRAMUSCULAR | Status: DC | PRN

## 2021-02-16 MED ORDER — SODIUM CHLORIDE 0.9% FLUSH: 3.0000 mL | Freq: Two times a day (BID) | INTRAVENOUS | Status: DC

## 2021-02-16 MED ORDER — HEPARIN (PORCINE) IN NACL 1000-0.9 UT/500ML-% IV SOLN
INTRAVENOUS | Status: AC
  Filled 2021-02-16: qty 1000

## 2021-02-16 MED ORDER — SODIUM CHLORIDE 0.9 % IV SOLN: 250.0000 mL | INTRAVENOUS | Status: DC | PRN

## 2021-02-16 MED ORDER — FENTANYL CITRATE (PF) 100 MCG/2ML IJ SOLN
INTRAMUSCULAR | Status: AC
  Filled 2021-02-16: qty 2

## 2021-02-16 MED ORDER — SODIUM CHLORIDE 0.9 % IV SOLN: INTRAVENOUS | Status: AC

## 2021-02-16 MED ORDER — MIDAZOLAM HCL 2 MG/2ML IJ SOLN
INTRAMUSCULAR | Status: DC | PRN
  Administered 2021-02-16: 1 mg via INTRAVENOUS

## 2021-02-16 MED ORDER — SODIUM CHLORIDE 0.9% FLUSH: 3.0000 mL | INTRAVENOUS | Status: DC | PRN

## 2021-02-16 MED ORDER — FENTANYL CITRATE (PF) 100 MCG/2ML IJ SOLN
INTRAMUSCULAR | Status: DC | PRN
  Administered 2021-02-16: 25 ug via INTRAVENOUS

## 2021-02-16 MED ORDER — ASPIRIN 81 MG PO CHEW
81.0000 mg | CHEWABLE_TABLET | ORAL | Status: AC
  Administered 2021-02-16: 81 mg via ORAL
  Filled 2021-02-16: qty 1

## 2021-02-16 MED ORDER — LIDOCAINE HCL (PF) 1 % IJ SOLN
INTRAMUSCULAR | Status: DC | PRN
  Administered 2021-02-16 (×2): 2 mL

## 2021-02-16 MED ORDER — HYDRALAZINE HCL 20 MG/ML IJ SOLN: 10.0000 mg | INTRAMUSCULAR | Status: DC | PRN

## 2021-02-16 MED ORDER — IOHEXOL 350 MG/ML SOLN
INTRAVENOUS | Status: DC | PRN
  Administered 2021-02-16: 75 mL

## 2021-02-16 MED ORDER — HEPARIN SODIUM (PORCINE) 1000 UNIT/ML IJ SOLN
INTRAMUSCULAR | Status: AC
  Filled 2021-02-16: qty 10

## 2021-02-16 MED ORDER — ACETAMINOPHEN 325 MG PO TABS: 650.0000 mg | ORAL_TABLET | ORAL | Status: DC | PRN

## 2021-02-16 MED ORDER — LIDOCAINE HCL (PF) 1 % IJ SOLN
INTRAMUSCULAR | Status: AC
  Filled 2021-02-16: qty 30

## 2021-02-16 MED ORDER — SODIUM CHLORIDE 0.9 % IV SOLN: INTRAVENOUS | Status: DC

## 2021-02-16 MED ORDER — VERAPAMIL HCL 2.5 MG/ML IV SOLN
INTRAVENOUS | Status: AC
  Filled 2021-02-16: qty 2

## 2021-02-16 SURGICAL SUPPLY — 12 items
CATH BALLN WEDGE 5F 110CM (CATHETERS) ×1 IMPLANT
CATH INFINITI 5 FR JL3.5 (CATHETERS) ×1 IMPLANT
CATH INFINITI 5FR AL1 (CATHETERS) ×1 IMPLANT
CATH INFINITI JR4 5F (CATHETERS) ×1 IMPLANT
DEVICE RAD COMP TR BAND LRG (VASCULAR PRODUCTS) ×1 IMPLANT
GLIDESHEATH SLEND SS 6F .021 (SHEATH) ×1 IMPLANT
GUIDEWIRE .025 260CM (WIRE) ×1 IMPLANT
GUIDEWIRE INQWIRE 1.5J.035X260 (WIRE) IMPLANT
INQWIRE 1.5J .035X260CM (WIRE) ×2
PACK CARDIAC CATHETERIZATION (CUSTOM PROCEDURE TRAY) ×2 IMPLANT
SHEATH GLIDE SLENDER 4/5FR (SHEATH) ×1 IMPLANT
TRANSDUCER W/STOPCOCK (MISCELLANEOUS) ×2 IMPLANT

## 2021-02-16 NOTE — Interval H&P Note (Signed)
History and Physical Interval Note:  02/16/2021 8:48 AM  Randy Morgan  has presented today for surgery, with the diagnosis of chf.  The various methods of treatment have been discussed with the patient and family. After consideration of risks, benefits and other options for treatment, the patient has consented to  Procedure(s): RIGHT/LEFT HEART CATH AND CORONARY ANGIOGRAPHY (N/A) and possible coronary angioplasty as a surgical intervention.  The patient's history has been reviewed, patient examined, no change in status, stable for surgery.  I have reviewed the patient's chart and labs.  Questions were answered to the patient's satisfaction.     Trenee Igoe

## 2021-02-19 ENCOUNTER — Encounter (HOSPITAL_COMMUNITY): Payer: Self-pay | Admitting: Internal Medicine

## 2021-02-20 ENCOUNTER — Telehealth: Payer: Self-pay | Admitting: *Deleted

## 2021-02-20 NOTE — Telephone Encounter (Signed)
Transition Care Management Unsuccessful Follow-up Telephone Call  Date of discharge and from where:  [facility] 02/04/21  Attempts:  1st Attempt  Reason for unsuccessful TCM follow-up call:  No answer/busy  Jacqlyn Larsen [facility], BSN RN Case Manager [phone]

## 2021-02-22 ENCOUNTER — Telehealth: Payer: Self-pay

## 2021-02-22 NOTE — Telephone Encounter (Signed)
°  Transition Care Management Follow-up Telephone Call Date of discharge and from where: 02/04/2021  [facility] How have you been since you were released from the hospital? I am swelling again- I have an appointment with cardiology tomorrow.  Any questions or concerns? No  Items Reviewed: Did the pt receive and understand the discharge instructions provided? Yes  Medications obtained and verified? Yes   reports he is out of his medications right now. Reports that he has refills and needs to go get his medications. Reports that he has not worked in a while.  Reports he can borrow the money. Other? No  Any new allergies since your discharge? No  Dietary orders reviewed? Yes  Reports following a low salt diet  Do you have support at home? Yes   Home Care and Equipment/Supplies: Were home health services ordered? no If so, what is the name of the agency?  Has the agency set up a time to come to the patient's home? not applicable Were any new equipment or medical supplies ordered?  No What is the name of the medical supply agency?  Were you able to get the supplies/equipment? not applicable Do you have any questions related to the use of the equipment or supplies? No  Functional Questionnaire: (I = Independent and D = Dependent) ADLs: I  Bathing/Dressing- I  Meal Prep- I  Eating- I  Maintaining continence- I  Transferring/Ambulation- I  Managing Meds- I  Follow up appointments reviewed:  PCP Hospital f/u appt confirmed? No   Specialist Hospital f/u appt confirmed? Yes  Scheduled to see Cardiology on 02/23/2021  Are transportation arrangements needed? No  If their condition worsens, is the pt aware to call PCP or go to the Emergency Dept.? Yes Was the patient provided with contact information for the PCP's office or ED? Yes Was to pt encouraged to call back with questions or concerns? Yes  Tomasa Rand, RN, BSN, CEN [facility] [employer] [phone]

## 2021-02-23 ENCOUNTER — Ambulatory Visit (HOSPITAL_BASED_OUTPATIENT_CLINIC_OR_DEPARTMENT_OTHER): Payer: Self-pay | Admitting: Cardiology

## 2021-02-26 ENCOUNTER — Encounter (HOSPITAL_BASED_OUTPATIENT_CLINIC_OR_DEPARTMENT_OTHER): Payer: Self-pay | Admitting: Cardiology

## 2021-02-26 ENCOUNTER — Other Ambulatory Visit: Payer: Self-pay

## 2021-02-26 ENCOUNTER — Ambulatory Visit (INDEPENDENT_AMBULATORY_CARE_PROVIDER_SITE_OTHER): Payer: 59 | Admitting: Cardiology

## 2021-02-26 VITALS — BP 142/88 | HR 98 | Ht 66.05 in | Wt 329.4 lb

## 2021-02-26 DIAGNOSIS — I5042 Chronic combined systolic (congestive) and diastolic (congestive) heart failure: Secondary | ICD-10-CM

## 2021-02-26 DIAGNOSIS — G4733 Obstructive sleep apnea (adult) (pediatric): Secondary | ICD-10-CM

## 2021-02-26 DIAGNOSIS — Z7189 Other specified counseling: Secondary | ICD-10-CM

## 2021-02-26 DIAGNOSIS — I428 Other cardiomyopathies: Secondary | ICD-10-CM | POA: Diagnosis not present

## 2021-02-26 DIAGNOSIS — I10 Essential (primary) hypertension: Secondary | ICD-10-CM

## 2021-02-26 DIAGNOSIS — I110 Hypertensive heart disease with heart failure: Secondary | ICD-10-CM

## 2021-02-26 NOTE — Patient Instructions (Signed)
Medication Instructions:  -take torsemide 20 mg twice a day for five days, aiming for weight between 310-315 lbs. In the future, if weight is over 315 lbs, take a second dose of the torsemide that day. -we will likely change losartan to entresto at your follow up with Dr. Haroldine Laws  *If you need a refill on your cardiac medications before your next appointment, please call your pharmacy*   Lab Work: None ordered today   Testing/Procedures: Your physician has recommended that you have a sleep study. This test records several body functions during sleep, including: brain activity, eye movement, oxygen and carbon dioxide blood levels, heart rate and rhythm, breathing rate and rhythm, the flow of air through your mouth and nose, snoring, body muscle movements, and chest and belly movement. [facility]   Follow-Up: At [facility], you and your health needs are our priority.  As part of our continuing mission to provide you with exceptional heart care, we have created designated Provider Care Teams.  These Care Teams include your primary Cardiologist (physician) and Advanced Practice Providers (APPs -  Physician Assistants and Nurse Practitioners) who all work together to provide you with the care you need, when you need it.  We recommend signing up for the patient portal called "MyChart".  Sign up information is provided on this After Visit Summary.  MyChart is used to connect with patients for Virtual Visits (Telemedicine).  Patients are able to view lab/test results, encounter notes, upcoming appointments, etc.  Non-urgent messages can be sent to your provider as well.   To learn more about what you can do with MyChart, go to [email or web address].    Your next appointment:   4-6 week(s)  The format for your next appointment:   In Person  Provider:   Buford Dresser, MD

## 2021-02-26 NOTE — Progress Notes (Signed)
Cardiology Office Note:    Date:  02/26/2021   ID:  [name], DOB [birth year], MRN [id number]  PCP:  Vevelyn Francois, NP  Cardiologist:  Buford Dresser, MD  Referring MD: Vevelyn Francois, NP   CC: follow up  History of Present Illness:    Randy Morgan is a 56 y.o. male with a hx of recently diagnosed congestive heart failure, hypertension, sleep apnea, nonischemic cardiomyopathy and asthma, who is seen for post-hospital follow-up and discussion of catheterization results (02/16/21).   I initially met Randy Morgan during his admission 01/30/2021. He had presented with symptoms concerning for acute systolic and diastolic CHF with pericardial effusion. Since discharge he has been seen by advanced heart failure clinic (Dr. Haroldine Laws) and undergone R/LHC with normal coronary arteries.  Today: Overall, he feels okay aside from daytime somnolence. He states he can't stay awake, and usually only has 4 hours of sleep a night. He has not yet gotten a power cord for his CPAP. In the past couple days he has been feeling short of breath as well.  After taking torsemide he generally has good urine production. However, he reports that he has to drink enough for this to occur. If he takes torsemide in the morning, he usually drinks ice water and elevates his legs which helps. Lately his LE edema has been "up and down."  At home, he notes his weight has recently started increasing again. His diet has not changed dramatically, and his weight has been consistently around 318 lbs for about a week. He has been air frying all of his meats.  For work he drives a Medical illustrator. He has waited to return to work until he could discuss his concerns at this appointment. Typically he is taking most of his medications at 9 AM. He will be adjusting this for working night shifts.   He denies any palpitations, or chest pain. No lightheadedness, headaches, syncope, orthopnea, or PND.  Past Medical History:   Diagnosis Date   Asthma    CHF (congestive heart failure) (HCC)    Hypertension    Sleep apnea    hasn't started CPAP yet    Past Surgical History:  Procedure Laterality Date   NO PAST SURGERIES     RIGHT/LEFT HEART CATH AND CORONARY ANGIOGRAPHY N/A 02/16/2021   Procedure: RIGHT/LEFT HEART CATH AND CORONARY ANGIOGRAPHY;  Surgeon: Jolaine Artist, MD;  Location: [facility];  Service: Cardiovascular;  Laterality: N/A;   VIDEO BRONCHOSCOPY Bilateral 12/09/2017   Procedure: VIDEO BRONCHOSCOPY WITHOUT FLUORO;  Surgeon: Laurin Coder, MD;  Location: [facility];  Service: Cardiopulmonary;  Laterality: Bilateral;    Current Medications: Current Outpatient Medications on File Prior to Visit  Medication Sig   empagliflozin (JARDIANCE) 10 MG TABS tablet Take 1 tablet (10 mg total) by mouth daily.   losartan (COZAAR) 25 MG tablet Take 1 tablet (25 mg total) by mouth daily.   metoprolol succinate (TOPROL-XL) 25 MG 24 hr tablet Take 1 tablet (25 mg total) by mouth daily.   torsemide (DEMADEX) 20 MG tablet Take 1 tablet (20 mg total) by mouth daily.   No current facility-administered medications on file prior to visit.     Allergies:   Patient has no known allergies.   Social History   Tobacco Use   Smoking status: Some Days    Packs/day: 0.50    Years: 25.00    Pack years: 12.50    Types: Cigarettes   Smokeless tobacco: Former  Electronics engineer  Use   Vaping Use: Never used  Substance Use Topics   Alcohol use: Not Currently    Alcohol/week: 2.0 standard drinks    Types: 2 Cans of beer per week    Comment: occ beer   Drug use: No    Family History: family history includes Asthma in his mother and sister; Diabetes in his mother; Heart attack in his brother; Heart disease in his brother, father, and mother. There is no history of Colon cancer, Esophageal cancer, Rectal cancer, or Stomach cancer.  ROS:   Please see the history of present illness.  Additional pertinent  ROS: Constitutional: Negative for chills, fever, night sweats, unintentional weight loss. Positive for weight gain, daytime somnolence. HENT: Negative for ear pain and hearing loss.   Eyes: Negative for loss of vision and eye pain.  Respiratory: Negative for cough, sputum, wheezing. Positive for shortness of breath.  Cardiovascular: See HPI. Gastrointestinal: Negative for abdominal pain, melena, and hematochezia.  Genitourinary: Negative for dysuria and hematuria.  Musculoskeletal: Negative for falls and myalgias.  Skin: Negative for itching and rash.  Neurological: Negative for focal weakness, focal sensory changes and loss of consciousness.  Endo/Heme/Allergies: Does not bruise/bleed easily.     EKGs/Labs/Other Studies Reviewed:    The following studies were reviewed today:  Right/Left Heart Cath 02/16/2021: Findings:   RA = 12 RV = 44/14 PA = 40/24 (21) PCW = 21 Fick cardiac output/index = 8.0/3.3 PVR = 0.9 WU FA sat = 97% PA sat =  78%, 80% SVC sat = 79%   Assessment: 1. Normal coronaries 2. Mild NICM EF 45% (suspect HTN in nature) 3. Mildly elevated filling pressures with high cardiac output 4. No evidence of intracardiac shunting   Plan/Discussion:   Medical therapy. With aggressive BP control, weight loss and CPAP.   Diagnostic Dominance: Left   Echo 02/01/2021: Sonographer Comments: Limited echo for effusion.  IMPRESSIONS    1. COmpared to echo report from 2019, LVEF is now depressed.   2. Left ventricular ejection fraction, by estimation, is 35 to 40%. The  left ventricle has moderately decreased function. The left ventricle  demonstrates global hypokinesis. There is severe left ventricular  hypertrophy. Indeterminate diastolic filling  due to E-A fusion.   3. Right ventricular systolic function is low normal. The right  ventricular size is normal. There is moderately elevated pulmonary artery  systolic pressure.   4. Moderate pericardial effusion.    5. The mitral valve is normal in structure. Mild mitral valve  regurgitation.   6. The aortic valve is tricuspid. Aortic valve regurgitation is not  visualized. Aortic valve sclerosis/calcification is present, without any  evidence of aortic stenosis.   7. The inferior vena cava is dilated in size with <50% respiratory  variability, suggesting right atrial pressure of 15 mmHg.   CTA Chest 01/31/2021: FINDINGS: Cardiovascular: There is adequate opacification of the pulmonary arterial tree through the segmental level. No intraluminal filling defect identified to suggest acute pulmonary embolism. The central pulmonary arteries are enlarged in keeping with changes of pulmonary arterial hypertension.   Mild coronary artery calcification. Mild global cardiomegaly. Moderate pericardial effusion has developed in the interval since prior examination. No CT evidence of cardiac tamponade. The thoracic aorta is unremarkable.   Mediastinum/Nodes: No enlarged mediastinal, hilar, or axillary lymph nodes. Thyroid gland, trachea, and esophagus demonstrate no significant findings.   Lungs/Pleura: There is central pulmonary vascular congestion. Additionally, there is mild peribronchovascular soft tissue thickening within the perihilar regions bilaterally likely  reflecting trace perihilar pulmonary edema. No confluent pulmonary infiltrate. Minimal left basilar atelectasis. No pneumothorax or pleural effusion. No central obstructing mass.   Upper Abdomen: Cholelithiasis.  No acute abnormality   Musculoskeletal: No acute bone abnormality. No lytic or blastic bone lesion.   Review of the MIP images confirms the above findings.   IMPRESSION: No pulmonary embolism.   Interval development of a moderate pericardial effusion. No CT evidence of cardiac tamponade.   Central pulmonary vascular congestion with trace perihilar pulmonary edema in keeping with mild cardiogenic failure.   Mild coronary  artery calcification. Mild global cardiomegaly. Morphologic changes in keeping with pulmonary arterial hypertension.   Cholelithiasis  EKG:  EKG is personally reviewed.   02/26/2021: EKG was not ordered.   Recent Labs: 01/31/2021: B Natriuretic Peptide 175.4 02/01/2021: ALT 23; TSH 0.502 02/03/2021: Magnesium 2.4 02/09/2021: BUN 18; Creatinine, Ser 1.24; Platelets 186 02/16/2021: Hemoglobin 11.9; Potassium <2.0; Sodium 136   Recent Lipid Panel    Component Value Date/Time   CHOL 166 02/03/2021 0327   CHOL 220 (H) 03/02/2020 1034   TRIG 160 (H) 02/03/2021 0327   HDL 36 (L) 02/03/2021 0327   HDL 36 (L) 03/02/2020 1034   CHOLHDL 4.6 02/03/2021 0327   VLDL 32 02/03/2021 0327   LDLCALC 98 02/03/2021 0327   LDLCALC 154 (H) 03/02/2020 1034    Physical Exam:    VS:  BP (!) 142/88 (BP Location: Left Arm, Patient Position: Sitting, Cuff Size: Large)    Pulse 98    Ht 5' 6.05" (1.678 m)    Wt (!) 329 lb 6.4 oz (149.4 kg)    SpO2 95%    BMI 53.09 kg/m     Wt Readings from Last 3 Encounters:  02/26/21 (!) 329 lb 6.4 oz (149.4 kg)  02/16/21 (!) 308 lb (139.7 kg)  02/09/21 (!) 318 lb (144.2 kg)    GEN: Well nourished, well developed in no acute distress HEENT: Normal, moist mucous membranes NECK: No JVD appreciated but difficult body habitus CARDIAC: regular rhythm, normal S1 and S2, no rubs or gallops. No murmur. VASCULAR: Radial and DP pulses 2+ bilaterally. No carotid bruits RESPIRATORY:  Clear to auscultation without rales, wheezing or rhonchi  ABDOMEN: Soft, non-tender, non-distended MUSCULOSKELETAL:  Ambulates independently SKIN: Warm and dry, no pitting LE edema NEUROLOGIC:  Alert and oriented x 3. No focal neuro deficits noted. PSYCHIATRIC:  Normal affect    ASSESSMENT:    1. Chronic combined systolic and diastolic heart failure ([place])   2. OSA (obstructive sleep apnea)   3. NICM (nonischemic cardiomyopathy) ([place])   4. Essential hypertension   5. LVH (left ventricular  hypertrophy) due to hypertensive disease, with heart failure ([place])   6. Encounter for education about heart failure   7. Morbid obesity (HCC)    PLAN:    Recently diagnosed (now chronic) systolic and diastolic heart failure Cardiomyopathy, nonischemic Hypertension Sleep apnea Moderate pericardial effusion LVH Morbid obesity, BMI 53 today -admission weight 144.7 kg, discharge weight 141.5 kg. Today he is 149.4 kg on the office scale -tolerating losartan, metoprolol succinate, Jardiance. Has not taken yet this AM. Anticipate changing to entresto at his HF follow up 2/17. Increasing torsemide frequency today so will hold on changing to entresto at the same time. -needs large BP cuff for his home machine. -cath as above, normal coronaries -pending cMRI -referred for sleep study--has a machine from a friend but missing a cord, not sure what settings he needs.  -will increase torsemide to  20 mg BID for 5 days and use PRN for home weight >315 lbs. He will contact me if he does not make good urine on this dose.  -we discussed diet/salt recommendations and long term education re: heart failure -given return to work letter effective today, asked for rest and bathroom breaks as needed -recheck echo 3 mos after maximum tolerated guideline directed medical therapy -would benefit from weight loss. Once on optimized medications, consider GLP1RA for weight loss  Cardiac risk counseling and prevention recommendations: -recommend heart healthy/Mediterranean diet, with whole grains, fruits, vegetable, fish, lean meats, nuts, and olive oil. Limit salt. -recommend moderate walking, 3-5 times/week for 30-50 minutes each session. Aim for at least 150 minutes.week. Goal should be pace of 3 miles/hours, or walking 1.5 miles in 30 minutes -recommend avoidance of tobacco products. Avoid excess alcohol.  Plan for follow up: 4-5 weeks or sooner as needed.  Total time of encounter: 40 minutes total time of  encounter, including 27 minutes spent in face-to-face patient care. This time includes coordination of care and counseling regarding test results, heart failure management and education. Remainder of non-face-to-face time involved reviewing chart documents/testing relevant to the patient encounter and documentation in the medical record.  Buford Dresser, MD, PhD, [facility]    Medication Adjustments/Labs and Tests Ordered: Current medicines are reviewed at length with the patient today.  Concerns regarding medicines are outlined above.   Orders Placed This Encounter  Procedures   Split night study   No orders of the defined types were placed in this encounter.  Patient Instructions  Medication Instructions:  -take torsemide 20 mg twice a day for five days, aiming for weight between 310-315 lbs. In the future, if weight is over 315 lbs, take a second dose of the torsemide that day. -we will likely change losartan to entresto at your follow up with Dr. Haroldine Laws  *If you need a refill on your cardiac medications before your next appointment, please call your pharmacy*   Lab Work: None ordered today   Testing/Procedures: Your physician has recommended that you have a sleep study. This test records several body functions during sleep, including: brain activity, eye movement, oxygen and carbon dioxide blood levels, heart rate and rhythm, breathing rate and rhythm, the flow of air through your mouth and nose, snoring, body muscle movements, and chest and belly movement. [facility]   Follow-Up: At [facility], you and your health needs are our priority.  As part of our continuing mission to provide you with exceptional heart care, we have created designated Provider Care Teams.  These Care Teams include your primary Cardiologist (physician) and Advanced Practice Providers (APPs -  Physician Assistants and Nurse Practitioners) who all work together  to provide you with the care you need, when you need it.  We recommend signing up for the patient portal called "MyChart".  Sign up information is provided on this After Visit Summary.  MyChart is used to connect with patients for Virtual Visits (Telemedicine).  Patients are able to view lab/test results, encounter notes, upcoming appointments, etc.  Non-urgent messages can be sent to your provider as well.   To learn more about what you can do with MyChart, go to [email or web address].    Your next appointment:   4-6 week(s)  The format for your next appointment:   In Person  Provider:   Buford Dresser, MD      [facility],acting as a scribe for [employer],  MD.,have documented all relevant documentation on the behalf of Buford Dresser, MD,as directed by  Buford Dresser, MD while in the presence of Buford Dresser, MD.  I, Buford Dresser, MD, have reviewed all documentation for this visit. The documentation on 02/26/21 for the exam, diagnosis, procedures, and orders are all accurate and complete.   Signed, Buford Dresser, MD PhD 02/26/2021 8:39 AM    [place]

## 2021-03-02 ENCOUNTER — Other Ambulatory Visit: Payer: Self-pay | Admitting: Cardiology

## 2021-03-02 DIAGNOSIS — G4733 Obstructive sleep apnea (adult) (pediatric): Secondary | ICD-10-CM

## 2021-03-07 ENCOUNTER — Encounter: Payer: Self-pay | Admitting: Nurse Practitioner

## 2021-03-07 ENCOUNTER — Ambulatory Visit (INDEPENDENT_AMBULATORY_CARE_PROVIDER_SITE_OTHER): Payer: 59 | Admitting: Nurse Practitioner

## 2021-03-07 ENCOUNTER — Other Ambulatory Visit: Payer: Self-pay

## 2021-03-07 VITALS — BP 165/99 | HR 98 | Temp 97.9°F | Ht 66.0 in | Wt 329.0 lb

## 2021-03-07 DIAGNOSIS — E876 Hypokalemia: Secondary | ICD-10-CM

## 2021-03-07 DIAGNOSIS — G4733 Obstructive sleep apnea (adult) (pediatric): Secondary | ICD-10-CM | POA: Diagnosis not present

## 2021-03-07 DIAGNOSIS — R7303 Prediabetes: Secondary | ICD-10-CM | POA: Diagnosis not present

## 2021-03-07 DIAGNOSIS — I10 Essential (primary) hypertension: Secondary | ICD-10-CM | POA: Diagnosis not present

## 2021-03-07 NOTE — Progress Notes (Signed)
[place], [place]  [address] Phone:  [phone]   Fax:  [phone]   Established Patient Office Visit  Subjective:  Patient ID: Randy Morgan, male    DOB: 24-Feb-1965  Age: 56 y.o. MRN: [id number]  CC:  Chief Complaint  Patient presents with   Follow-up    Pt have question on the sleep study that he did pt wanted to know about his CPAP machine he never got it    HPI Randy Morgan presents for follow up. He  has a past medical history of Asthma, CHF (congestive heart failure) ([place]), Hypertension, and Sleep apnea.   He is following up today from his hospital visit. He was admitted and diagnosed with Heart failure, EF between 35% -40%, COVID, acute respiratory failure, acute kidney injury, OSA, HTN and left arm lipoma.   He is last in office follow up was 05/2020. Compliance was an issue. He is aware that he should have been more compliant.   He reports being on new medication; Jardianace 10 mg , Losartan 25 mg, Metoprolol 25 mg and torsemide 20 mg. He reports that his weight has increased. He has swelling. He has not contacted cardiology related to this change. He is not eating crazy. He was eating sunflower seeds and felt like this has made it worse.  He is urinary frequency. He reports that he has been doubling up due to the torsemide.   He has OSA and he was previously denies CPAP. He reports that he is not sleeping well. He will have this repeated.  His A1C is 6.1% Past Medical History:  Diagnosis Date   Asthma    CHF (congestive heart failure) ([place])    Hypertension    Sleep apnea    hasn't started CPAP yet    Past Surgical History:  Procedure Laterality Date   NO PAST SURGERIES     RIGHT/LEFT HEART CATH AND CORONARY ANGIOGRAPHY N/A 02/16/2021   Procedure: RIGHT/LEFT HEART CATH AND CORONARY ANGIOGRAPHY;  Surgeon: Jolaine Artist, MD;  Location: [facility];  Service: Cardiovascular;  Laterality: N/A;   VIDEO  BRONCHOSCOPY Bilateral 12/09/2017   Procedure: VIDEO BRONCHOSCOPY WITHOUT FLUORO;  Surgeon: Laurin Coder, MD;  Location: [facility];  Service: Cardiopulmonary;  Laterality: Bilateral;    Family History  Problem Relation Age of Onset   Heart disease Mother    Diabetes Mother    Asthma Mother    Heart disease Father    Asthma Sister    Heart disease Brother    Heart attack Brother    Colon cancer Neg Hx    Esophageal cancer Neg Hx    Rectal cancer Neg Hx    Stomach cancer Neg Hx     Social History   Socioeconomic History   Marital status: Married    Spouse name: Not on file   Number of children: 2   Years of education: 12th grade + 2 years college   Highest education level: Some college, no degree  Occupational History   Occupation: makes concrete blocks and bricks   Occupation: Freight forwarder  Tobacco Use   Smoking status: Former    Packs/day: 0.50    Years: 25.00    Pack years: 12.50    Types: Cigarettes    Quit date: 02/25/2021    Years since quitting: 0.0   Smokeless tobacco: Never  Vaping Use   Vaping Use: Never used  Substance and Sexual Activity  Alcohol use: Not Currently    Alcohol/week: 2.0 standard drinks    Types: 2 Cans of beer per week    Comment: occ beer   Drug use: No   Sexual activity: Not Currently  Other Topics Concern   Not on file  Social History Narrative   Lives with his wife and daughter.   Other child lives independently in [place].   Drinks about 3 sodas a day       Pt stated he quit smoking   Social Determinants of Health   Financial Resource Strain: High Risk   Difficulty of Paying Living Expenses: Hard  Food Insecurity: No Food Insecurity   Worried About Charity fundraiser in the Last Year: Never true   Ran Out of Food in the Last Year: Never true  Transportation Needs: No Transportation Needs   Lack of Transportation (Medical): No   Lack of Transportation (Non-Medical): No  Physical Activity: Not on file   Stress: Not on file  Social Connections: Not on file  Intimate Partner Violence: Not on file    Outpatient Medications Prior to Visit  Medication Sig Dispense Refill   losartan (COZAAR) 25 MG tablet Take 1 tablet (25 mg total) by mouth daily. 30 tablet 2   metoprolol succinate (TOPROL-XL) 25 MG 24 hr tablet Take 1 tablet (25 mg total) by mouth daily. 30 tablet 2   torsemide (DEMADEX) 20 MG tablet Take 1 tablet (20 mg total) by mouth daily. 30 tablet 2   empagliflozin (JARDIANCE) 10 MG TABS tablet Take 1 tablet (10 mg total) by mouth daily. (Patient not taking: Reported on 03/07/2021) 30 tablet 2   No facility-administered medications prior to visit.    No Known Allergies  ROS Review of Systems    Objective:    Physical Exam Constitutional:      Appearance: He is obese.  HENT:     Head: Normocephalic and atraumatic.  Cardiovascular:     Rate and Rhythm: Normal rate and regular rhythm.     Pulses: Normal pulses.     Heart sounds: Normal heart sounds.  Pulmonary:     Effort: Pulmonary effort is normal.     Comments: diminished Musculoskeletal:     Cervical back: Normal range of motion.     Right lower leg: Edema present.     Left lower leg: Edema present.  Skin:    Capillary Refill: Capillary refill takes less than 2 seconds.  Neurological:     General: No focal deficit present.     Mental Status: He is alert and oriented to person, place, and time.  Psychiatric:        Mood and Affect: Mood normal.        Behavior: Behavior normal.    BP (!) 165/99    Pulse 98    Temp 97.9 F (36.6 C)    Ht 5\' 6"  (1.676 m)    Wt (!) 329 lb (149.2 kg)    SpO2 100%    BMI 53.10 kg/m  Wt Readings from Last 3 Encounters:  03/07/21 (!) 329 lb (149.2 kg)  02/26/21 (!) 329 lb 6.4 oz (149.4 kg)  02/16/21 (!) 308 lb (139.7 kg)     Health Maintenance Due  Topic Date Due   Zoster Vaccines- Shingrix (1 of 2) Never done   COVID-19 Vaccine (3 - Booster for Pfizer series) 12/10/2019    INFLUENZA VACCINE  08/28/2020    There are no preventive care reminders to display for  this patient.  Lab Results  Component Value Date   TSH 0.502 02/01/2021   Lab Results  Component Value Date   WBC 5.7 02/09/2021   HGB 11.9 (L) 02/16/2021   HCT 35.0 (L) 02/16/2021   MCV 89.7 02/09/2021   PLT 186 02/09/2021   Lab Results  Component Value Date   NA 136 02/16/2021   K <2.0 (LL) 02/16/2021   CO2 32 02/09/2021   GLUCOSE 145 (H) 02/09/2021   BUN 18 02/09/2021   CREATININE 1.24 02/09/2021   BILITOT 0.5 02/01/2021   ALKPHOS 49 02/01/2021   AST 22 02/01/2021   ALT 23 02/01/2021   PROT 6.8 02/01/2021   ALBUMIN 3.3 (L) 02/01/2021   CALCIUM 9.0 02/09/2021   ANIONGAP 9 02/09/2021   Lab Results  Component Value Date   CHOL 166 02/03/2021   Lab Results  Component Value Date   HDL 36 (L) 02/03/2021   Lab Results  Component Value Date   LDLCALC 98 02/03/2021   Lab Results  Component Value Date   TRIG 160 (H) 02/03/2021   Lab Results  Component Value Date   CHOLHDL 4.6 02/03/2021   Lab Results  Component Value Date   HGBA1C 6.1 (H) 02/01/2021      Assessment & Plan:   Problem List Items Addressed This Visit       Cardiovascular and Mediastinum   Essential hypertension, benign Persistent Follow up with cardiology as scheduled Encouraged on going compliance with current medication regimen Encouraged home monitoring and recording BP <130/80 Eating a heart-healthy diet with less salt Encouraged regular physical activity  Recommend Weight loss       Respiratory   Sleep apnea Sleeps study pending   Other Visit Diagnoses     Prediabetes    -  Primary Currently on Jardiance Obesity with BMI and comorbidities as noted above.  Discussed proper diet (low fat, low sodium, high fiber) with patient.   Discussed need for regular exercise (3 times per week, 20 minutes per session) with patient.    Hypokalemia     Potassium <2.0 on 02/16/21 No current  replacement   Relevant Orders   Comp. Metabolic Panel (12) (Completed)       No orders of the defined types were placed in this encounter.   Follow-up: Return in about 3 months (around 06/04/2021).    Vevelyn Francois, NP

## 2021-03-07 NOTE — Patient Instructions (Signed)
Sleep Apnea ?Sleep apnea affects breathing during sleep. It causes breathing to stop for 10 seconds or more, or to become shallow. People with sleep apnea usually snore loudly. ?It can also increase the risk of: ?Heart attack. ?Stroke. ?Being very overweight (obese). ?Diabetes. ?Heart failure. ?Irregular heartbeat. ?High blood pressure. ?The goal of treatment is to help you breathe normally again. ?What are the causes? ?The most common cause of this condition is a collapsed or blocked airway. ?There are three kinds of sleep apnea: ?Obstructive sleep apnea. This is caused by a blocked or collapsed airway. ?Central sleep apnea. This happens when the brain does not send the right signals to the muscles that control breathing. ?Mixed sleep apnea. This is a combination of obstructive and central sleep apnea. ?What increases the risk? ?Being overweight. ?Smoking. ?Having a small airway. ?Being older. ?Being male. ?Drinking alcohol. ?Taking medicines to calm yourself (sedatives or tranquilizers). ?Having family members with the condition. ?Having a tongue or tonsils that are larger than normal. ?What are the signs or symptoms? ?Trouble staying asleep. ?Loud snoring. ?Headaches in the morning. ?Waking up gasping. ?Dry mouth or sore throat in the morning. ?Being sleepy or tired during the day. ?If you are sleepy or tired during the day, you may also: ?Not be able to focus your mind (concentrate). ?Forget things. ?Get angry a lot and have mood swings. ?Feel sad (depressed). ?Have changes in your personality. ?Have less interest in sex, if you are male. ?Be unable to have an erection, if you are male. ?How is this treated? ? ?Sleeping on your side. ?Using a medicine to get rid of mucus in your nose (decongestant). ?Avoiding the use of alcohol, medicines to help you relax, or certain pain medicines (narcotics). ?Losing weight, if needed. ?Changing your diet. ?Quitting smoking. ?Using a machine to open your airway while you  sleep, such as: ?An oral appliance. This is a mouthpiece that shifts your lower jaw forward. ?A CPAP device. This device blows air through a mask when you breathe out (exhale). ?An EPAP device. This has valves that you put in each nostril. ?A BIPAP device. This device blows air through a mask when you breathe in (inhale) and breathe out. ?Having surgery if other treatments do not work. ?Follow these instructions at home: ?Lifestyle ?Make changes that your doctor recommends. ?Eat a healthy diet. ?Lose weight if needed. ?Avoid alcohol, medicines to help you relax, and some pain medicines. ?Do not smoke or use any products that contain nicotine or tobacco. If you need help quitting, ask your doctor. ?General instructions ?Take over-the-counter and prescription medicines only as told by your doctor. ?If you were given a machine to use while you sleep, use it only as told by your doctor. ?If you are having surgery, make sure to tell your doctor you have sleep apnea. You may need to bring your device with you. ?Keep all follow-up visits. ?Contact a doctor if: ?The machine that you were given to use during sleep bothers you or does not seem to be working. ?You do not get better. ?You get worse. ?Get help right away if: ?Your chest hurts. ?You have trouble breathing in enough air. ?You have an uncomfortable feeling in your back, arms, or stomach. ?You have trouble talking. ?One side of your body feels weak. ?A part of your face is hanging down. ?These symptoms may be an emergency. Get help right away. Call your local emergency services (911 in the [place] ?Do not wait to see if the symptoms   will go away. ?Do not drive yourself to the hospital. ?Summary ?This condition affects breathing during sleep. ?The most common cause is a collapsed or blocked airway. ?The goal of treatment is to help you breathe normally while you sleep. ?This information is not intended to replace advice given to you by your health care provider. Make  sure you discuss any questions you have with your health care provider. ?Document Revised: 08/23/2020 Document Reviewed: 12/24/2019 ?Elsevier Patient Education ? 2022 Elsevier Inc. ? ?

## 2021-03-08 LAB — COMP. METABOLIC PANEL (12)
AST: 28 IU/L (ref 0–40)
Albumin/Globulin Ratio: 1.4 (ref 1.2–2.2)
Albumin: 4.2 g/dL (ref 3.8–4.9)
Alkaline Phosphatase: 69 IU/L (ref 44–121)
BUN/Creatinine Ratio: 10 (ref 9–20)
BUN: 12 mg/dL (ref 6–24)
Bilirubin Total: 0.9 mg/dL (ref 0.0–1.2)
Calcium: 9 mg/dL (ref 8.7–10.2)
Chloride: 99 mmol/L (ref 96–106)
Creatinine, Ser: 1.15 mg/dL (ref 0.76–1.27)
Globulin, Total: 3.1 g/dL (ref 1.5–4.5)
Glucose: 102 mg/dL — ABNORMAL HIGH (ref 70–99)
Potassium: 4 mmol/L (ref 3.5–5.2)
Sodium: 146 mmol/L — ABNORMAL HIGH (ref 134–144)
Total Protein: 7.3 g/dL (ref 6.0–8.5)
eGFR: 75 mL/min/{1.73_m2} (ref >59)

## 2021-03-09 ENCOUNTER — Telehealth (HOSPITAL_COMMUNITY): Payer: Self-pay | Admitting: Emergency Medicine

## 2021-03-09 NOTE — Telephone Encounter (Signed)
Attempted to call patient regarding upcoming cardiac MR appointment. Left message on voicemail with name and callback number Chanz Cahall RN Navigator Cardiac Imaging [facility] [phone] Office [phone] Cell  

## 2021-03-12 ENCOUNTER — Ambulatory Visit (HOSPITAL_COMMUNITY)
Admission: RE | Admit: 2021-03-12 | Discharge: 2021-03-12 | Disposition: A | Discharge status: Home / Self Care | Payer: 59 | Source: Ambulatory Visit | Attending: Adult Health | Admitting: Adult Health

## 2021-03-12 ENCOUNTER — Other Ambulatory Visit: Payer: Self-pay

## 2021-03-12 DIAGNOSIS — I5022 Chronic systolic (congestive) heart failure: Secondary | ICD-10-CM | POA: Insufficient documentation

## 2021-03-12 IMAGING — MR MR CARD MORPHOLOGY WO/W CM
45 of 48 series · 45 of 48 positions shown · IV contrast (Contrast agent)
Comparison: none

CLINICAL DATA: Nonischemic cardiomyopathy

EXAM:
CARDIAC MRI
TECHNIQUE: The patient was scanned on a 1.5 Tesla GE magnet. A dedicated
cardiac coil was used. Functional imaging was done using Fiesta
sequences. [DATE], and 4 chamber views were done to assess for RWMA's.
Modified ZUBAIRU rule using a short axis stack was used to
calculate an ejection fraction on a dedicated work station using
Circle software. The patient received 15 cc of Gadavist. After 10
minutes inversion recovery sequences were used to assess for
infiltration and scar tissue.
CONTRAST:  15 cc Gadavist

[Series 4: [id number]_bh · axial · 8.0mm · 1.56mm/px · 1 of 16 slices shown]
[im 1/16]
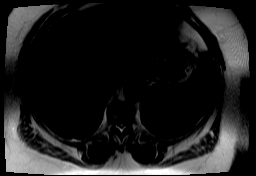

[Series 8: bSSFP · sagittal · 8.0mm · 1.61mm/px · 1 of 25 slices shown (1 of 28)]
[im 1/25]
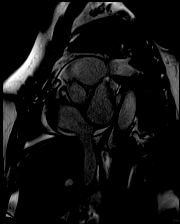

[Series 9: bSSFP · sagittal · 8.0mm · 1.79mm/px · 1 of 25 slices shown (2 of 28)]
[im 1/25]
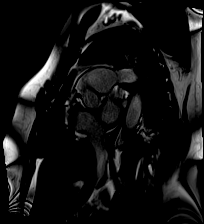

[Series 10: bSSFP · sagittal · 8.0mm · 1.79mm/px · 1 of 25 slices shown (3 of 28)]
[im 1/25]
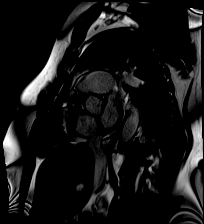

[Series 11: bSSFP · sagittal · 8.0mm · 1.79mm/px · 1 of 25 slices shown (4 of 28)]
[im 1/25]
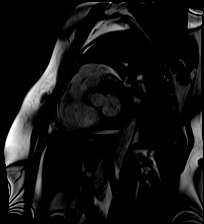

[Series 12: bSSFP · sagittal · 8.0mm · 1.79mm/px · 1 of 25 slices shown (5 of 28)]
[im 1/25]
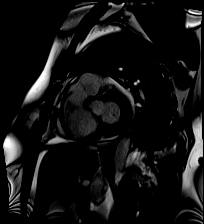

[Series 13: bSSFP · sagittal · 8.0mm · 1.88mm/px · 1 of 25 slices shown (6 of 28)]
[im 1/25]
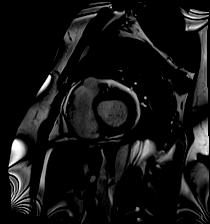

[Series 14: bSSFP · sagittal · 8.0mm · 1.88mm/px · 1 of 25 slices shown (7 of 28)]
[im 1/25]
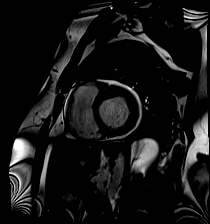

[Series 15: bSSFP · sagittal · 8.0mm · 1.88mm/px · 1 of 25 slices shown (8 of 28)]
[im 1/25]
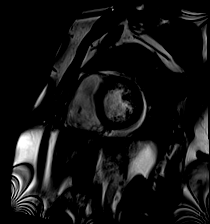

[Series 16: bSSFP · sagittal · 8.0mm · 1.88mm/px · 1 of 25 slices shown (9 of 28)]
[im 1/25]
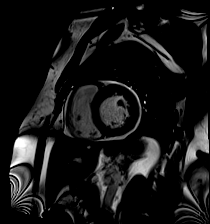

[Series 17: bSSFP · sagittal · 8.0mm · 1.88mm/px · 1 of 25 slices shown (10 of 28)]
[im 1/25]
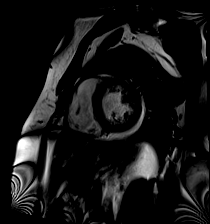

[Series 18: bSSFP · sagittal · 8.0mm · 1.88mm/px · 1 of 25 slices shown (11 of 28)]
[im 1/25]
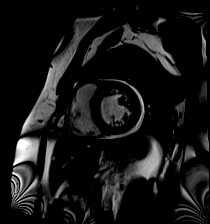

[Series 19: bSSFP · sagittal · 8.0mm · 1.88mm/px · 1 of 25 slices shown (12 of 28)]
[im 1/25]
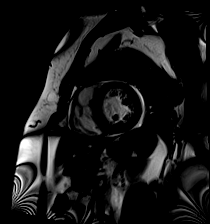

[Series 20: bSSFP · sagittal · 8.0mm · 1.88mm/px · 1 of 25 slices shown (13 of 28)]
[im 1/25]
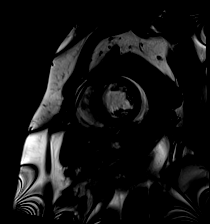

[Series 21: bSSFP · sagittal · 8.0mm · 1.88mm/px · 1 of 25 slices shown (14 of 28)]
[im 1/25]
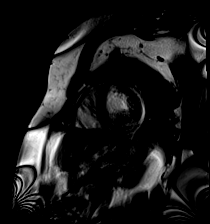

[Series 22: bSSFP · sagittal · 8.0mm · 1.88mm/px · 1 of 25 slices shown (15 of 28)]
[im 1/25]
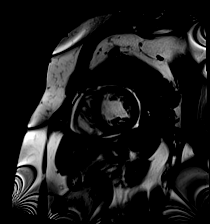

[Series 23: bSSFP · sagittal · 8.0mm · 1.88mm/px · 1 of 25 slices shown (16 of 28)]
[im 1/25]
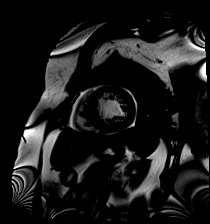

[Series 24: bSSFP · sagittal · 8.0mm · 1.88mm/px · 1 of 25 slices shown (17 of 28)]
[im 1/25]
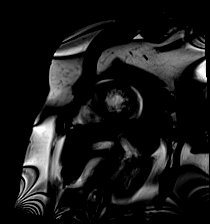

[Series 25: bSSFP · sagittal · 8.0mm · 1.88mm/px · 1 of 25 slices shown (18 of 28)]
[im 1/25]
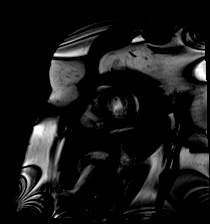

[Series 26: bSSFP · sagittal · 8.0mm · 1.88mm/px · 1 of 25 slices shown (19 of 28)]
[im 1/25]
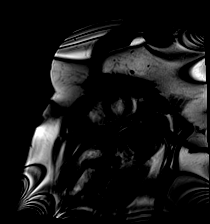

[Series 27: bSSFP · sagittal · 8.0mm · 1.88mm/px · 1 of 25 slices shown (20 of 28)]
[im 1/25]
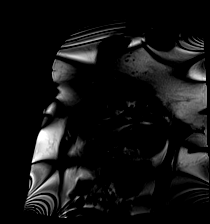

[Series 28: bSSFP · sagittal · 8.0mm · 1.88mm/px · 1 of 25 slices shown (21 of 28)]
[im 1/25]
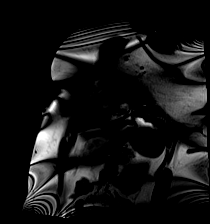

[Series 29: bSSFP · sagittal · 8.0mm · 1.88mm/px · 1 of 25 slices shown (22 of 28)]
[im 1/25]
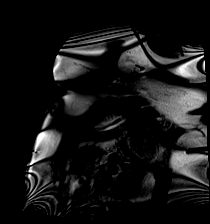

[Series 30: bSSFP · sagittal · 8.0mm · 1.88mm/px · 1 of 25 slices shown (23 of 28)]
[im 1/25]
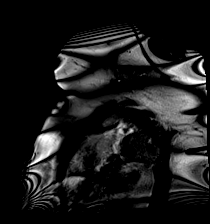

[Series 31: (id)_long_t1 · sagittal · 8.0mm · 2.34mm/px · 1 of 24 slices shown]
[im 1/24]
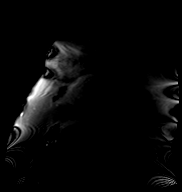

[Series 32: (id)_long_t1_moco · sagittal · 8.0mm · 2.34mm/px · 1 of 24 slices shown]
[im 1/24]
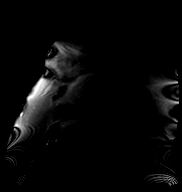

[Series 33: (id)_long_t1_moco_t1 · sagittal · 8.0mm · 2.34mm/px · 1 of 6 slices shown]
[im 1/6]
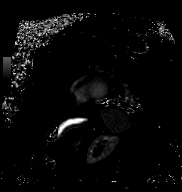

[Series 35: (id)_trufi · sagittal · 8.0mm · 2.08mm/px · 1 of 9 slices shown]
[im 1/9]
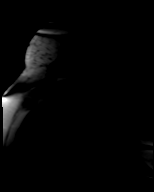

[Series 36: (id)_trufi_moco · sagittal · 8.0mm · 2.08mm/px · 1 of 9 slices shown]
[im 1/9]
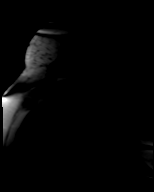

[Series 37: (id)_trufi_moco_t2 · sagittal · 8.0mm · 2.08mm/px · 1 of 4 slices shown]
[im 1/4]
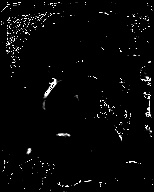

[Series 39: bSSFP · oblique · 6.0mm · 1.76mm/px · 1 of 25 slices shown (24 of 28)]
[im 1/25]
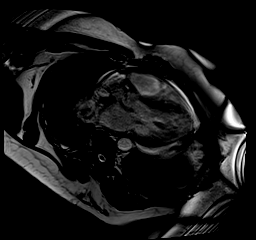

[Series 40: bSSFP · coronal · 6.0mm · 1.41mm/px · 1 of 25 slices shown (25 of 28)]
[im 1/25]
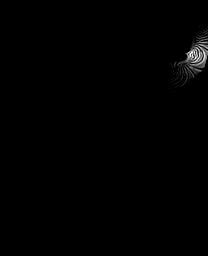

[Series 41: bSSFP · coronal · 6.0mm · 1.64mm/px · 1 of 25 slices shown (26 of 28)]
[im 1/25]
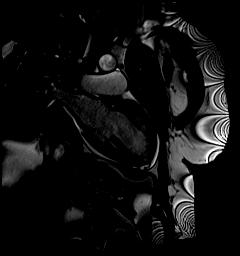

[Series 42: bSSFP · axial · 6.0mm · 1.64mm/px · 1 of 25 slices shown (27 of 28)]
[im 1/25]
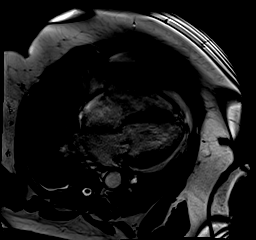

[Series 43: bSSFP · oblique · 6.0mm · 1.76mm/px · 1 of 25 slices shown (28 of 28)]
[im 1/25]
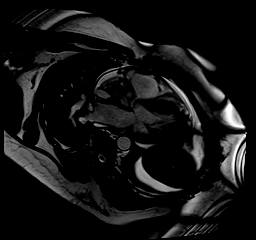

[Series 44: cine_trufi_cs_rt_short axis · oblique · 8.0mm · 2.12mm/px · 1 of 11 slices shown (1 of 10)]
[im 1/11]
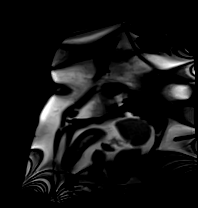

[Series 44: cine_trufi_cs_rt_short axis · oblique · 8.0mm · 2.12mm/px · 1 of 11 slices shown (2 of 10)]
[im 1/11]
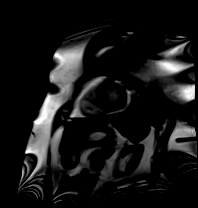

[Series 44: cine_trufi_cs_rt_short axis · oblique · 8.0mm · 2.12mm/px · 1 of 11 slices shown (3 of 10)]
[im 1/11]
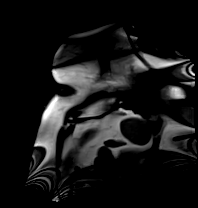

[Series 44: cine_trufi_cs_rt_short axis · oblique · 8.0mm · 2.12mm/px · 1 of 11 slices shown (4 of 10)]
[im 1/11]
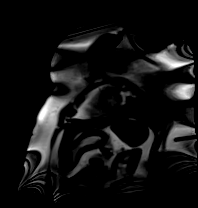

[Series 44: cine_trufi_cs_rt_short axis · oblique · 8.0mm · 2.12mm/px · 1 of 11 slices shown (5 of 10)]
[im 1/11]
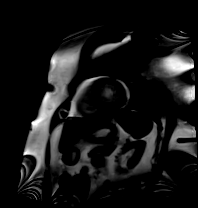

[Series 44: cine_trufi_cs_rt_short axis · oblique · 8.0mm · 2.12mm/px · 1 of 11 slices shown (6 of 10)]
[im 1/11]
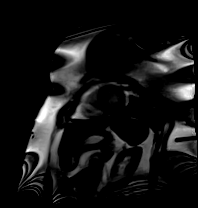

[Series 44: cine_trufi_cs_rt_short axis · oblique · 8.0mm · 2.12mm/px · 1 of 11 slices shown (7 of 10)]
[im 1/11]
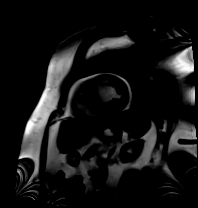

[Series 44: cine_trufi_cs_rt_short axis · oblique · 8.0mm · 2.12mm/px · 1 of 11 slices shown (8 of 10)]
[im 1/11]
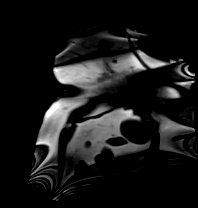

[Series 44: cine_trufi_cs_rt_short axis · oblique · 8.0mm · 2.12mm/px · 1 of 11 slices shown (9 of 10)]
[im 1/11]
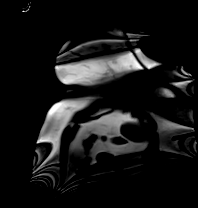

[Series 44: cine_trufi_cs_rt_short axis · oblique · 8.0mm · 2.12mm/px · 1 of 11 slices shown (10 of 10)]
[im 1/11]
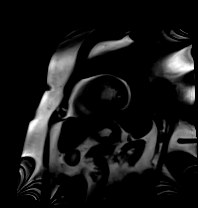

[45 of 48 positions shown; findings below may reference images not displayed]

FINDINGS: Technically difficult study due to respiratory artifact.

Limited images of the lung fields showed no gross abnormalities.

There was a small circumferential pericardial effusion. Mildly
dilated left ventricle with moderate concentric LV hypertrophy.
Diffuse hypokinesis with LVEF 29%. Moderate left atrial enlargement,
mild right atrial enlargement. Moderately dilated RV with moderate
systolic dysfunction, RVEF 34%. Mild-moderate mitral regurgitation.
Trileaflet aortic valve with no significant stenosis or
regurgitation.

On delayed enhancement imaging, there was no definite late
gadolinium enhancement (LGE).

MEASUREMENTS:
MEASUREMENTS
LVEDV 282 mL
LVSV 82 mL
LVEF 29%

RVEDV 325 mL
RVSV 109 mL
RVEF 34%

T1 [AM], ECV 27%
IMPRESSION: 1.  Small circumferential pericardial effusion.

2. Mildly dilated left ventricle with moderate concentric
hypertrophy. EF 29%, diffuse hypokinesis.

3.  Moderately dilated right ventricle with RV EF 34%.

4. No myocardial LGE, so no definitive evidence for prior MI,
myocarditis, or infiltrative disease.

5.  Normal T1 parameters.

ZUBAIRU

## 2021-03-12 MED ORDER — GADOBUTROL 1 MMOL/ML IV SOLN
10.0000 mL | Freq: Once | INTRAVENOUS | Status: AC | PRN
  Administered 2021-03-12: 10 mL via INTRAVENOUS

## 2021-03-16 ENCOUNTER — Ambulatory Visit (HOSPITAL_COMMUNITY)
Admission: RE | Admit: 2021-03-16 | Discharge: 2021-03-16 | Disposition: A | Discharge status: Home / Self Care | Payer: 59 | Source: Ambulatory Visit | Attending: Internal Medicine | Admitting: Internal Medicine

## 2021-03-16 ENCOUNTER — Other Ambulatory Visit: Payer: Self-pay

## 2021-03-16 ENCOUNTER — Other Ambulatory Visit (HOSPITAL_COMMUNITY): Payer: Self-pay

## 2021-03-16 VITALS — BP 166/104 | HR 86 | Ht 66.0 in | Wt 328.6 lb

## 2021-03-16 DIAGNOSIS — I5032 Chronic diastolic (congestive) heart failure: Secondary | ICD-10-CM | POA: Insufficient documentation

## 2021-03-16 DIAGNOSIS — Z7984 Long term (current) use of oral hypoglycemic drugs: Secondary | ICD-10-CM | POA: Insufficient documentation

## 2021-03-16 DIAGNOSIS — I10 Essential (primary) hypertension: Secondary | ICD-10-CM | POA: Diagnosis not present

## 2021-03-16 DIAGNOSIS — R0602 Shortness of breath: Secondary | ICD-10-CM | POA: Diagnosis present

## 2021-03-16 DIAGNOSIS — I110 Hypertensive heart disease with heart failure: Secondary | ICD-10-CM | POA: Insufficient documentation

## 2021-03-16 DIAGNOSIS — Z87891 Personal history of nicotine dependence: Secondary | ICD-10-CM | POA: Insufficient documentation

## 2021-03-16 DIAGNOSIS — Z09 Encounter for follow-up examination after completed treatment for conditions other than malignant neoplasm: Secondary | ICD-10-CM | POA: Diagnosis not present

## 2021-03-16 DIAGNOSIS — Z79899 Other long term (current) drug therapy: Secondary | ICD-10-CM | POA: Insufficient documentation

## 2021-03-16 DIAGNOSIS — Z6841 Body Mass Index (BMI) 40.0 and over, adult: Secondary | ICD-10-CM | POA: Diagnosis not present

## 2021-03-16 DIAGNOSIS — E119 Type 2 diabetes mellitus without complications: Secondary | ICD-10-CM | POA: Insufficient documentation

## 2021-03-16 DIAGNOSIS — Z7901 Long term (current) use of anticoagulants: Secondary | ICD-10-CM | POA: Diagnosis not present

## 2021-03-16 DIAGNOSIS — M7989 Other specified soft tissue disorders: Secondary | ICD-10-CM | POA: Insufficient documentation

## 2021-03-16 DIAGNOSIS — G4733 Obstructive sleep apnea (adult) (pediatric): Secondary | ICD-10-CM | POA: Diagnosis not present

## 2021-03-16 DIAGNOSIS — Z8616 Personal history of COVID-19: Secondary | ICD-10-CM | POA: Diagnosis not present

## 2021-03-16 DIAGNOSIS — I3139 Other pericardial effusion (noninflammatory): Secondary | ICD-10-CM | POA: Diagnosis not present

## 2021-03-16 DIAGNOSIS — Z8249 Family history of ischemic heart disease and other diseases of the circulatory system: Secondary | ICD-10-CM | POA: Diagnosis not present

## 2021-03-16 DIAGNOSIS — Z91119 Patient's noncompliance with dietary regimen due to unspecified reason: Secondary | ICD-10-CM | POA: Diagnosis not present

## 2021-03-16 DIAGNOSIS — I5042 Chronic combined systolic (congestive) and diastolic (congestive) heart failure: Secondary | ICD-10-CM

## 2021-03-16 LAB — BASIC METABOLIC PANEL
Anion gap: 9 (ref 5–15)
BUN: 15 mg/dL (ref 6–20)
CO2: 32 mmol/L (ref 22–32)
Calcium: 8.9 mg/dL (ref 8.9–10.3)
Chloride: 99 mmol/L (ref 98–111)
Creatinine, Ser: 1.18 mg/dL (ref 0.61–1.24)
GFR, Estimated: >60 mL/min (ref >60)
Glucose, Bld: 134 mg/dL — ABNORMAL HIGH (ref 70–99)
Potassium: 3.9 mmol/L (ref 3.5–5.1)
Sodium: 140 mmol/L (ref 135–145)

## 2021-03-16 LAB — BRAIN NATRIURETIC PEPTIDE: B Natriuretic Peptide: 115.9 pg/mL — ABNORMAL HIGH (ref 0.0–100.0)

## 2021-03-16 MED ORDER — ENTRESTO 49-51 MG PO TABS: 1.0000 | ORAL_TABLET | Freq: Two times a day (BID) | ORAL | 3 refills | Status: DC

## 2021-03-16 MED ORDER — TORSEMIDE 20 MG PO TABS: 40.0000 mg | ORAL_TABLET | Freq: Two times a day (BID) | ORAL | 2 refills | Status: DC

## 2021-03-16 MED ORDER — SPIRONOLACTONE 25 MG PO TABS: 25.0000 mg | ORAL_TABLET | Freq: Every day | ORAL | 3 refills | Status: DC

## 2021-03-16 MED ORDER — POTASSIUM CHLORIDE CRYS ER 20 MEQ PO TBCR: 20.0000 meq | EXTENDED_RELEASE_TABLET | Freq: Two times a day (BID) | ORAL | 3 refills | Status: DC

## 2021-03-16 NOTE — Patient Instructions (Addendum)
Medication Changes:  Stop Losartan  Increase Torsemide to 40 mg (2 tabs) Twice daily   Start Entresto 49/51 mg Twice daily   Start Spironolactone 25 mg Daily  Start Potassium (k-dur) 20 meq Twice daily   Lab Work:  Labs done today, your results will be available in MyChart, we will contact you for abnormal readings.  Testing/Procedures:  None  Referrals:  None  Special Instructions // Education:  GET YOUR CPAP  Do the following things EVERYDAY: Weigh yourself in the morning before breakfast. Write it down and keep it in a log. Take your medicines as prescribed Eat low salt foods--Limit salt (sodium) to 2000 mg per day.  Stay as active as you can everyday Limit all fluids for the day to less than 2 liters   Follow-Up in:   Please follow up with our heart failure pharmacist in 2 weeks **PLEASE BRING ALL MEDICATIONS TO THIS APPOINTMENT  Your physician recommends that you schedule a follow-up appointment in: 2 months with Provider  At the [facility], you and your health needs are our priority. We have a designated team specialized in the treatment of Heart Failure. This Care Team includes your primary Heart Failure Specialized Cardiologist (physician), Advanced Practice Providers (APPs- Physician Assistants and Nurse Practitioners), and Pharmacist who all work together to provide you with the care you need, when you need it.   You may see any of the following providers on your designated Care Team at your next follow up:  Dr Glori Bickers Dr Haynes Kerns, NP Lyda Jester, [place] [facility] [place], [place] Audry Riles, PharmD   Please be sure to bring in all your medications bottles to every appointment.   Need to Contact us:  If you have any questions or concerns before your next appointment please send [place] a message through [place] or call our office at [phone].    TO LEAVE A MESSAGE FOR THE NURSE SELECT OPTION  2, PLEASE LEAVE A MESSAGE INCLUDING: YOUR NAME DATE OF BIRTH CALL BACK NUMBER REASON FOR CALL**this is important as we prioritize the call backs  YOU WILL RECEIVE A CALL BACK THE SAME DAY AS LONG AS YOU CALL BEFORE 4:00 PM

## 2021-03-16 NOTE — Progress Notes (Addendum)
ADVANCED HF CLINIC NOTE     Referring Physician: Dr Harrell Gave  Primary Care: Dionisio David NP  Primary Cardiologist: Dr Harrell Gave   HPI: Referred to clinic by Dr Harrell Gave for heart failure consultation.   [name] is a 56  year old with  morbid obesity, HTN, OSA, HFpEF, tobacco abuse, and DM2. Dad died from coronary disease at 28 and Mom has heart failure.   Had sleep study 10/2019 AHI 80.with desaturations to 68%. Had CPAP titration but has never received the machine.  Last smoked 01/29/21. Previously smoking 1PPD.   Admitted 01/30/21 with increased dyspnea in the setting of COVID. Echo EF 35-40%  He was volume overloaded and diuresed with IV lasix.  CT negative for PE. Started on GDMT. Discharge weight 310 pounds. Seen in [facility] and set up for cath.   R/L Cath 02/16/21 Normal cors. LVEF 45% RA = 12 RV = 44/14 PA = 40/24 (21) PCW = 21 Fick cardiac output/index = 8.0/3.3 PVR = 0.9 WU FA sat = 97% PA sat =  78%, 80% SVC sat = 79%  Here for routine f/u with his wife. Says fluid is starting to come back. More SOB. Legs swollen. + snoring. Drinking a lot of lemonade. Has not get CPAP yet.   Cardiac Testing  Echo 2019 55-60% Grade I DD Mild LVH. Trivial pericardial effusion.  Echo 01/2021 EF 35-40% Moderate pericardial effusion. Severe LVH.     Past Medical History:  Diagnosis Date   Asthma    CHF (congestive heart failure) (HCC)    Hypertension    Sleep apnea    hasn't started CPAP yet    Current Outpatient Medications  Medication Sig Dispense Refill   empagliflozin (JARDIANCE) 10 MG TABS tablet Take 1 tablet (10 mg total) by mouth daily. 30 tablet 2   losartan (COZAAR) 25 MG tablet Take 1 tablet (25 mg total) by mouth daily. 30 tablet 2   metoprolol succinate (TOPROL-XL) 25 MG 24 hr tablet Take 1 tablet (25 mg total) by mouth daily. 30 tablet 2   torsemide (DEMADEX) 20 MG tablet Take 1 tablet (20 mg total) by mouth daily. 30 tablet 2   No current  facility-administered medications for this encounter.    No Known Allergies    Social History   Socioeconomic History   Marital status: Married    Spouse name: Not on file   Number of children: 2   Years of education: 12th grade + 2 years college   Highest education level: Some college, no degree  Occupational History   Occupation: makes concrete blocks and bricks   Occupation: Freight forwarder  Tobacco Use   Smoking status: Former    Packs/day: 0.50    Years: 25.00    Pack years: 12.50    Types: Cigarettes    Quit date: 02/25/2021    Years since quitting: 0.0   Smokeless tobacco: Never  Vaping Use   Vaping Use: Never used  Substance and Sexual Activity   Alcohol use: Not Currently    Alcohol/week: 2.0 standard drinks    Types: 2 Cans of beer per week    Comment: occ beer   Drug use: No   Sexual activity: Not Currently  Other Topics Concern   Not on file  Social History Narrative   Lives with his wife and daughter.   Other child lives independently in [place].   Drinks about 3 sodas a day       Pt  stated he quit smoking   Social Determinants of Health   Financial Resource Strain: High Risk   Difficulty of Paying Living Expenses: Hard  Food Insecurity: No Food Insecurity   Worried About Charity fundraiser in the Last Year: Never true   Ran Out of Food in the Last Year: Never true  Transportation Needs: No Transportation Needs   Lack of Transportation (Medical): No   Lack of Transportation (Non-Medical): No  Physical Activity: Not on file  Stress: Not on file  Social Connections: Not on file  Intimate Partner Violence: Not on file      Family History  Problem Relation Age of Onset   Heart disease Mother    Diabetes Mother    Asthma Mother    Heart disease Father    Asthma Sister    Heart disease Brother    Heart attack Brother    Colon cancer Neg Hx    Esophageal cancer Neg Hx    Rectal cancer Neg Hx    Stomach cancer Neg Hx     Vitals:    03/16/21 1112  BP: (!) 166/104  Pulse: 86  SpO2: 94%  Weight: (!) 149.1 kg (328 lb 9.6 oz)  Height: 5\' 6"  (1.676 m)    Wt Readings from Last 3 Encounters:  03/16/21 (!) 149.1 kg (328 lb 9.6 oz)  03/07/21 (!) 149.2 kg (329 lb)  02/26/21 (!) 149.4 kg (329 lb 6.4 oz)    PHYSICAL EXAM: General:  Obese male  No resp difficulty HEENT: normal Neck: supple. no JVD. Carotids 2+ bilat; no bruits. No lymphadenopathy or thryomegaly appreciated. Cor: PMI nondisplaced. Regular rate & rhythm. No rubs, gallops or murmurs. Lungs: clear Abdomen: soft, nontender, nondistended. No hepatosplenomegaly. No bruits or masses. Good bowel sounds. Extremities: no cyanosis, clubbing, rash, 2-3+ edema Neuro: alert & orientedx3, cranial nerves grossly intact. moves all 4 extremities w/o difficulty. Affect pleasant    ASSESSMENT & PLAN:  1. Chronic systolic HF - Echo 4/17 40-81% LVH mod pericardial effusion. EF previously 55% in 2019.  - Cath 1/23; Normal cors. EF 45% (suspect HTN in nature). Mildly elevated pressures with high CO - NYHA II-III - Remains markedly volume overloaded in setting of dietary non-compliance. Increase torsemide to 40 bid - Discussed need for dietary complaince - Continue Toprol 25 mg daily  - Switch losartan to Entresto 49/51 - Add spiro 25  - Add k 20 bid - Continue jardiance 10 mg daily  - Enroll in PharmD med titration program  2. HTN, Uncontrolled.  - titrate meds as above  3. OSA, severe - Needs CPAP  and weight loss  4. DM2 - Hgb A1C 6.1  - Jardiance 10 mg daily . May need SLT2i - Needs follow up with PCP  - Consider GLP1RA  5. Obesity, morbid - Body mass index is 53.04 kg/m. - Needs weight loss - Consider GLP1RA  6. Tobacco Abuse - Former heavy smoker.  - Quit 01/29/21  Total time spent > 45 minutes. Over half that time spent discussing above.    Glori Bickers, MD  11:41 AM

## 2021-03-22 ENCOUNTER — Other Ambulatory Visit (HOSPITAL_COMMUNITY): Payer: Self-pay

## 2021-04-02 ENCOUNTER — Ambulatory Visit (HOSPITAL_BASED_OUTPATIENT_CLINIC_OR_DEPARTMENT_OTHER): Payer: 59 | Admitting: Cardiovascular Disease

## 2021-04-02 DIAGNOSIS — G4733 Obstructive sleep apnea (adult) (pediatric): Secondary | ICD-10-CM

## 2021-04-03 NOTE — Progress Notes (Signed)
Cardiology Office Note:    Date:  04/05/2021   ID:  Randy Morgan, DOB 1965/06/23, MRN [id number]  PCP:  Randy Francois, NP  Cardiologist:  Randy Dresser, MD  Referring MD: Randy Francois, NP   CC: follow up  History of Present Illness:    Randy Morgan is a 56 y.o. male with a hx of nonischemic cardiomyopathy, chronic systolic and diastolic heart failure, hypertension, sleep apnea, and asthma, who is seen for follow-up. .   I initially met Mr. Stracke during his admission 02/26/2021. He had presented with symptoms concerning for acute systolic and diastolic CHF with pericardial effusion. Since discharge he has been seen by advanced heart failure clinic (Dr. Haroldine Laws) and undergone R/LHC with normal coronary arteries.  Today: He officially quit smoking. However, he has been gaining weight. He believes some of the weight is fluid since he was not taking his torsemide since it ran out. His weight was 302 lbs when he left the hospital which increased up to 315 lbs. He monitors his weight using a home scale. Due to the fluid retention, he notices his breathing is more labored. Putting on his socks, for example can cause him to feel winded. He reports his breathing is fine if he is sitting and resting. Recently, his Randy Morgan also ran out. After his appointment, he will pick up his prescriptions. Of note, he was urinating frequently while on torsemide.   He associates his high blood pressure reading in clinic to working all night and having to rush to his appointment. His blood pressure improved upon repeat.   He was able to successfully complete his home sleep study and he is preparing to receive a sleep machine.   He avoids drinking sodas, but his wife recently bought him sparkling flavored water. He does not enjoy the drinks but is continuing to try them. He also enjoys diet sodas and ginger ale. His wife helps him limit his soda drinks to 8 oz. His whole family is working on  weight loss. He hopes to start walking regularly again after the fluid retention resolves.   Denies chest pain. No PND, orthopnea, or unexpected weight gain. No syncope or palpitations.  Past Medical History:  Diagnosis Date   Asthma    CHF (congestive heart failure) (HCC)    Hypertension    Sleep apnea    hasn't started CPAP yet    Past Surgical History:  Procedure Laterality Date   NO PAST SURGERIES     RIGHT/LEFT HEART CATH AND CORONARY ANGIOGRAPHY N/A 02/16/2021   Procedure: RIGHT/LEFT HEART CATH AND CORONARY ANGIOGRAPHY;  Surgeon: Randy Artist, MD;  Location: [facility];  Service: Cardiovascular;  Laterality: N/A;   VIDEO BRONCHOSCOPY Bilateral 12/09/2017   Procedure: VIDEO BRONCHOSCOPY WITHOUT FLUORO;  Surgeon: Randy Coder, MD;  Location: [facility];  Service: Cardiopulmonary;  Laterality: Bilateral;    Current Medications: No current outpatient medications on file prior to visit.   No current facility-administered medications on file prior to visit.     Allergies:   Patient has no known allergies.   Social History   Tobacco Use   Smoking status: Former    Packs/day: 0.50    Years: 25.00    Pack years: 12.50    Types: Cigarettes    Quit date: 02/25/2021    Years since quitting: 0.1   Smokeless tobacco: Never  Vaping Use   Vaping Use: Never used  Substance Use Topics   Alcohol use: Not Currently  Alcohol/week: 2.0 standard drinks    Types: 2 Cans of beer per week    Comment: occ beer   Drug use: No    Family History: family history includes Asthma in his mother and sister; Diabetes in his mother; Heart attack in his brother; Heart disease in his brother, father, and mother. There is no history of Colon cancer, Esophageal cancer, Rectal cancer, or Stomach cancer.  ROS:   Please see the history of present illness.  Additional pertinent ROS: (+) Weight gain (+) Fluid retention (+) Shortness of breath (+) Snoring  EKGs/Labs/Other  Studies Reviewed:    The following studies were reviewed today: Cardiac MRI 03/12/21 IMPRESSION: 1.  Small circumferential pericardial effusion.   2. Mildly dilated left ventricle with moderate concentric hypertrophy. EF 29%, diffuse hypokinesis.   3.  Moderately dilated right ventricle with RV EF 34%.   4. No myocardial LGE, so no definitive evidence for prior MI, myocarditis, or infiltrative disease.   5.  Normal T1 parameters.  Right/Left Heart Cath 02/16/2021: Findings:   RA = 12 RV = 44/14 PA = 40/24 (21) PCW = 21 Fick cardiac output/index = 8.0/3.3 PVR = 0.9 WU FA sat = 97% PA sat =  78%, 80% SVC sat = 79%   Assessment: 1. Normal coronaries 2. Mild NICM EF 45% (suspect HTN in nature) 3. Mildly elevated filling pressures with high cardiac output 4. No evidence of intracardiac shunting   Plan/Discussion:   Medical therapy. With aggressive BP control, weight loss and CPAP.   Diagnostic Dominance: Left   Echo 02/01/2021: Sonographer Comments: Limited echo for effusion.  IMPRESSIONS    1. COmpared to echo report from 2019, LVEF is now depressed.   2. Left ventricular ejection fraction, by estimation, is 35 to 40%. The  left ventricle has moderately decreased function. The left ventricle  demonstrates global hypokinesis. There is severe left ventricular  hypertrophy. Indeterminate diastolic filling  due to E-A fusion.   3. Right ventricular systolic function is low normal. The right  ventricular size is normal. There is moderately elevated pulmonary artery  systolic pressure.   4. Moderate pericardial effusion.   5. The mitral valve is normal in structure. Mild mitral valve  regurgitation.   6. The aortic valve is tricuspid. Aortic valve regurgitation is not  visualized. Aortic valve sclerosis/calcification is present, without any  evidence of aortic stenosis.   7. The inferior vena cava is dilated in size with <50% respiratory  variability, suggesting  right atrial pressure of 15 mmHg.   CTA Chest 01/31/2021: FINDINGS: Cardiovascular: There is adequate opacification of the pulmonary arterial tree through the segmental level. No intraluminal filling defect identified to suggest acute pulmonary embolism. The central pulmonary arteries are enlarged in keeping with changes of pulmonary arterial hypertension.   Mild coronary artery calcification. Mild global cardiomegaly. Moderate pericardial effusion has developed in the interval since prior examination. No CT evidence of cardiac tamponade. The thoracic aorta is unremarkable.   Mediastinum/Nodes: No enlarged mediastinal, hilar, or axillary lymph nodes. Thyroid gland, trachea, and esophagus demonstrate no significant findings.   Lungs/Pleura: There is central pulmonary vascular congestion. Additionally, there is mild peribronchovascular soft tissue thickening within the perihilar regions bilaterally likely reflecting trace perihilar pulmonary edema. No confluent pulmonary infiltrate. Minimal left basilar atelectasis. No pneumothorax or pleural effusion. No central obstructing mass.   Upper Abdomen: Cholelithiasis.  No acute abnormality   Musculoskeletal: No acute bone abnormality. No lytic or blastic bone lesion.   Review of the  MIP images confirms the above findings.   IMPRESSION: No pulmonary embolism.   Interval development of a moderate pericardial effusion. No CT evidence of cardiac tamponade.   Central pulmonary vascular congestion with trace perihilar pulmonary edema in keeping with mild cardiogenic failure.   Mild coronary artery calcification. Mild global cardiomegaly. Morphologic changes in keeping with pulmonary arterial hypertension.   Cholelithiasis  EKG:  EKG was not ordered today  Recent Labs: 02/01/2021: ALT 23; TSH 0.502 02/03/2021: Magnesium 2.4 02/09/2021: Platelets 186 02/16/2021: Hemoglobin 11.9 03/16/2021: B Natriuretic Peptide 115.9; BUN 15;  Creatinine, Ser 1.18; Potassium 3.9; Sodium 140   Recent Lipid Panel    Component Value Date/Time   CHOL 166 02/03/2021 0327   CHOL 220 (H) 03/02/2020 1034   TRIG 160 (H) 02/03/2021 0327   HDL 36 (L) 02/03/2021 0327   HDL 36 (L) 03/02/2020 1034   CHOLHDL 4.6 02/03/2021 0327   VLDL 32 02/03/2021 0327   LDLCALC 98 02/03/2021 0327   LDLCALC 154 (H) 03/02/2020 1034    Physical Exam:    VS:  BP (!) 148/92 (BP Location: Right Arm, Patient Position: Sitting, Cuff Size: Large)    Pulse 84    Ht _0  (1.676 m)    Wt (!) 334 lb 1.6 oz (151.5 kg)    SpO2 96%    BMI 53.93 kg/m     Wt Readings from Last 3 Encounters:  04/04/21 (!) 334 lb 1.6 oz (151.5 kg)  04/02/21 (!) 310 lb (140.6 kg)  03/16/21 (!) 328 lb 9.6 oz (149.1 kg)    GEN: Well nourished, well developed in no acute distress HEENT: Normal, moist mucous membranes NECK: No JVD appreciated but difficult body habitus CARDIAC: regular rhythm, normal S1 and S2, no rubs or gallops. No murmur. VASCULAR: Radial and DP pulses 2+ bilaterally. No carotid bruits RESPIRATORY:  Clear to auscultation without rales, wheezing or rhonchi  ABDOMEN: non-tender, mild stomach distention MUSCULOSKELETAL:  Ambulates independently SKIN: Warm and dry, mild LE edema NEUROLOGIC:  Alert and oriented x 3. No focal neuro deficits noted. PSYCHIATRIC:  Normal affect    ASSESSMENT:    1. NICM (nonischemic cardiomyopathy) ([place])   2. Chronic combined systolic and diastolic heart failure ([place])   3. Morbid obesity ([place])   4. Essential hypertension   5. OSA (obstructive sleep apnea)   6. LVH (left ventricular hypertrophy) due to hypertensive disease, with heart failure ([place])   7. Encounter for education about heart failure     PLAN:    Recently diagnosed (now chronic) systolic and diastolic heart failure Cardiomyopathy, nonischemic Hypertension Sleep apnea Moderate pericardial effusion LVH Morbid obesity, BMI 53 today -admission weight 144.7 kg,  discharge weight 141.5 kg. Today he is 151.5 kg on the office scale -cath as above, normal coronaries -cMRI without LGE, no evidence of prior MI, myocarditis, infiltrative disease -has CPAP, getting this set up -would benefit from weight loss. Once on optimized medications, consider GLP1RA for weight loss  He has been out of many of his medications for several weeks, including torsemide. He is NYHA class 3 today and has some abdominal distention. Weight is up significantly. We reviewed importance of medication. Will restart/refill meds today, needs close follow up to make sure he improves with restart of medications. Otherwise he is high risk for readmission. We reviewed heart failure education today.  Current regimen: Entresto 49-51 mg BID Metoprolol succinate 25 mg daily Jardiance 10 mg daily Spironolactone 25 mg daily Torsemide 40 mg BID with potassium chloride  20 mEq BIF with torsemide  Cardiac risk counseling and prevention recommendations: -recommend heart healthy/Mediterranean diet, with whole grains, fruits, vegetable, fish, lean meats, nuts, and olive oil. Limit salt. -recommend moderate walking, 3-5 times/week for 30-50 minutes each session. Aim for at least 150 minutes.week. Goal should be pace of 3 miles/hours, or walking 1.5 miles in 30 minutes -recommend avoidance of tobacco products. Avoid excess alcohol.  Plan for follow up: 3-4 weeks   Total time of encounter: 40 minutes total time of encounter, including 27 minutes spent in face-to-face patient care. This time includes coordination of care and counseling regarding test results, heart failure management and education. Remainder of non-face-to-face time involved reviewing chart documents/testing relevant to the patient encounter and documentation in the medical record.  Randy Dresser, MD, PhD, [facility]    Medication Adjustments/Labs and Tests Ordered: Current medicines are reviewed at  length with the patient today.  Concerns regarding medicines are outlined above.   No orders of the defined types were placed in this encounter.  Meds ordered this encounter  Medications   metoprolol succinate (TOPROL-XL) 25 MG 24 hr tablet    Sig: Take 1 tablet (25 mg total) by mouth daily.    Dispense:  30 tablet    Refill:  11   empagliflozin (JARDIANCE) 10 MG TABS tablet    Sig: Take 1 tablet (10 mg total) by mouth daily.    Dispense:  30 tablet    Refill:  11   potassium chloride SA (KLOR-CON M) 20 MEQ tablet    Sig: Take 1 tablet (20 mEq total) by mouth 2 (two) times daily.    Dispense:  60 tablet    Refill:  11   sacubitril-valsartan (ENTRESTO) 49-51 MG    Sig: Take 1 tablet by mouth 2 (two) times daily.    Dispense:  60 tablet    Refill:  11    D/C order for Losartan   spironolactone (ALDACTONE) 25 MG tablet    Sig: Take 1 tablet (25 mg total) by mouth daily.    Dispense:  30 tablet    Refill:  11   torsemide (DEMADEX) 20 MG tablet    Sig: Take 2 tablets (40 mg total) by mouth 2 (two) times daily.    Dispense:  120 tablet    Refill:  11   Patient Instructions  Medication Instructions:  Your Physician recommend you continue on your current medication as directed.    *If you need a refill on your cardiac medications before your next appointment, please call your pharmacy*  Follow-Up: At [facility], you and your health needs are our priority.  As part of our continuing mission to provide you with exceptional heart care, we have created designated Provider Care Teams.  These Care Teams include your primary Cardiologist (physician) and Advanced Practice Providers (APPs -  Physician Assistants and Nurse Practitioners) who all work together to provide you with the care you need, when you need it.  We recommend signing up for the patient portal called "MyChart".  Sign up information is provided on this After Visit Summary.  MyChart is used to connect with patients for  Virtual Visits (Telemedicine).  Patients are able to view lab/test results, encounter notes, upcoming appointments, etc.  Non-urgent messages can be sent to your provider as well.   To learn more about what you can do with MyChart, go to [email or web address].    Your next appointment:   3-4 week(s)  The format for your next appointment:   In Person  Provider:   Buford Dresser, MD{   Wilhemina Bonito as a scribe for Randy Dresser, MD.,have documented all relevant documentation on the behalf of Randy Dresser, MD,as directed by  Randy Dresser, MD while in the presence of Randy Dresser, MD.  I, Randy Dresser, MD, have reviewed all documentation for this visit. The documentation on 04/05/21 for the exam, diagnosis, procedures, and orders are all accurate and complete.   Signed, Randy Dresser, MD PhD 04/05/2021 10:56 AM    [place]

## 2021-04-04 ENCOUNTER — Other Ambulatory Visit: Payer: Self-pay

## 2021-04-04 ENCOUNTER — Ambulatory Visit (INDEPENDENT_AMBULATORY_CARE_PROVIDER_SITE_OTHER): Payer: 59 | Admitting: Cardiology

## 2021-04-04 VITALS — BP 148/92 | HR 84 | Ht 66.0 in | Wt 334.1 lb

## 2021-04-04 DIAGNOSIS — G4733 Obstructive sleep apnea (adult) (pediatric): Secondary | ICD-10-CM

## 2021-04-04 DIAGNOSIS — I5042 Chronic combined systolic (congestive) and diastolic (congestive) heart failure: Secondary | ICD-10-CM

## 2021-04-04 DIAGNOSIS — I10 Essential (primary) hypertension: Secondary | ICD-10-CM | POA: Diagnosis not present

## 2021-04-04 DIAGNOSIS — I428 Other cardiomyopathies: Secondary | ICD-10-CM

## 2021-04-04 DIAGNOSIS — I5032 Chronic diastolic (congestive) heart failure: Secondary | ICD-10-CM

## 2021-04-04 DIAGNOSIS — Z7189 Other specified counseling: Secondary | ICD-10-CM

## 2021-04-04 DIAGNOSIS — I110 Hypertensive heart disease with heart failure: Secondary | ICD-10-CM

## 2021-04-04 MED ORDER — TORSEMIDE 20 MG PO TABS: 40.0000 mg | ORAL_TABLET | Freq: Two times a day (BID) | ORAL | 11 refills | Status: DC

## 2021-04-04 MED ORDER — SPIRONOLACTONE 25 MG PO TABS: 25.0000 mg | ORAL_TABLET | Freq: Every day | ORAL | 11 refills | Status: DC

## 2021-04-04 MED ORDER — ENTRESTO 49-51 MG PO TABS: 1.0000 | ORAL_TABLET | Freq: Two times a day (BID) | ORAL | 11 refills | Status: DC

## 2021-04-04 MED ORDER — POTASSIUM CHLORIDE CRYS ER 20 MEQ PO TBCR: 20.0000 meq | EXTENDED_RELEASE_TABLET | Freq: Two times a day (BID) | ORAL | 11 refills | Status: DC

## 2021-04-04 MED ORDER — METOPROLOL SUCCINATE ER 25 MG PO TB24: 25.0000 mg | ORAL_TABLET | Freq: Every day | ORAL | 11 refills | Status: DC

## 2021-04-04 MED ORDER — EMPAGLIFLOZIN 10 MG PO TABS: 10.0000 mg | ORAL_TABLET | Freq: Every day | ORAL | 11 refills | Status: DC

## 2021-04-04 NOTE — Patient Instructions (Signed)
Medication Instructions:  ?Your Physician recommend you continue on your current medication as directed.   ? ?*If you need a refill on your cardiac medications before your next appointment, please call your pharmacy* ? ?Follow-Up: ?At [facility], you and your health needs are our priority.  As part of our continuing mission to provide you with exceptional heart care, we have created designated Provider Care Teams.  These Care Teams include your primary Cardiologist (physician) and Advanced Practice Providers (APPs -  Physician Assistants and Nurse Practitioners) who all work together to provide you with the care you need, when you need it. ? ?We recommend signing up for the patient portal called "MyChart".  Sign up information is provided on this After Visit Summary.  MyChart is used to connect with patients for Virtual Visits (Telemedicine).  Patients are able to view lab/test results, encounter notes, upcoming appointments, etc.  Non-urgent messages can be sent to your provider as well.   ?To learn more about what you can do with MyChart, go to [email or web address].   ? ?Your next appointment:   ?3-4 week(s) ? ?The format for your next appointment:   ?In Person ? ?Provider:   ?Buford Dresser, MD{ ? ?

## 2021-04-04 NOTE — Progress Notes (Incomplete)
***In Progress***    Advanced Heart Failure Clinic Note    Referring Physician: Dr Harrell Gave  Primary Care: Dionisio David NP  Primary Cardiologist: Dr Harrell Gave    HPI: [name] is a 56  year old with  morbid obesity, HTN, OSA, HFpEF, tobacco abuse, and DM2. Dad died from coronary disease at 67 and Mom has heart failure.    Had sleep study 10/2019 AHI 80 with desaturations to 68%. Had CPAP titration but had not yet received the machine.   Last smoked 01/29/21. Previously smoked 1PPD.    Admitted 01/30/21 with increased dyspnea in the setting of COVID. Echo showed EF 35-40%  He was volume overloaded and diuresed with IV lasix.  CT was negative for PE. Started on GDMT. Discharge weight was 310 pounds.   R/L Cath on  02/16/21 Normal cors. LVEF 45% RA = 12 RV = 44/14 PA = 40/24 (21) PCW = 21 Fick cardiac output/index = 8.0/3.3 PVR = 0.9 WU FA sat = 97% PA sat =  78%, 80% SVC sat = 79%   He was last seen by MF in AHF clinic on 03/16/2021 for routine f/u with his wife. He reported fluid was starting to come back with more SOB. Reported legs swollen and he had been snoring. Stated he has been drinking a lot of lemonade and had not gotten CPAP yet.   ***He was seen by for cardiology f/u on 04/04/2021. At that visit, he noted weight gain attributable to not taking torsemide since it ran out along with his Jardiance. Weight at this visit was 315 lbs. He also reported more labored breathing from putting on socks. Denied SOB at rest. At this visit, his torsemide was ***?(Did this happen) increase to 20 mg BIDx5 days with plan for him to f/u with cardiology if he did not urinate. Stated he will pick up his meds after visit.  Today, he returns to HF clinic for pharmacist medication titration. At last visit with MD he was swapped to Entresto 49/51 mg BID, and started on spironolactone 25 mg daily and Kcl ***20 mEq BID.   Overall feeling ***. Dizziness, lightheadedness, fatigue:  Chest pain or  palpitations:  How is your breathing?: *** SOB: Able to complete all ADLs. Activity level ***  Weight at home pounds. Takes furosemide/torsemide/bumex *** mg *** daily.  LEE PND/Orthopnea  Appetite *** Low-salt diet:   Physical Exam Cost/affordability of meds   HF Medications: Metoprolol succinate 25 mg daily Entresto 49-51 mg BID Spironolactone 25 mg daily Empagliflozin 10 mg daily Torsemide 40 mg BID Kcl 20 mEq BID  Has the patient been experiencing any side effects to the medications prescribed?  {YES NO:22349}  Does the patient have any problems obtaining medications due to transportation or finances?   {YES [employer] with Wife  Understanding of regimen: {excellent/good/fair/poor:19665} Understanding of indications: {excellent/good/fair/poor:19665} Potential of compliance: {excellent/good/fair/poor:19665} Patient understands to avoid NSAIDs. Patient understands to avoid decongestants.    Pertinent Lab Values: 03/16/2021 - Serum creatinine 1.18, BUN 15, Potassium 3.9, Sodium 140, BNP 115,   Vital Signs: Weight: *** (last clinic weight: 328.6 lbs) Blood pressure: ***  Heart rate: ***   Assessment/Plan: 1. Chronic systolic HF - Echo 0/09 38-18% LVH mod pericardial effusion. EF previously 55% in 2019.  - Cath 1/23; Normal cors. EF 45% (suspect HTN in nature). Mildly elevated pressures with high CO - NYHA II-III - Remains markedly volume overloaded in setting of dietary non-compliance. Increase torsemide to 40 bid ***double  check order, confirm med list - Discussed need for dietary complaince - weight is up / plus fluid overloaded. - *** torsemide 40 mg BID ( cards note suggested he increased this but AVS says no changes) - ***increase?Continue Toprol 50 mg daily  -***continue/increase Entresto 97/103 mg BID - Continue spironolactone 25 mg daily. - Continue KCl 20 mEq BID - Continue jardiance 10 mg daily  - Enroll in PharmD med titration  program   2. HTN, Uncontrolled.  - titrate meds as above   3. OSA, severe - Needs CPAP and weight loss   4. DM2 - Hgb A1c 6.1  - Continue Jardiance 10 mg daily.  - Needs follow up with PCP. - Consider GLP1RA for weight loss.   5. Obesity, morbid - Body mass index is 53.04 kg/m. - Needs weight loss - Consider GLP1RA   6. Tobacco Abuse - Former heavy smoker.  - Quit 01/29/21    Follow up ***   Audry Riles, PharmD, BCPS, BCCP, CPP Heart Failure Clinic Pharmacist [phone]

## 2021-04-05 ENCOUNTER — Encounter (HOSPITAL_BASED_OUTPATIENT_CLINIC_OR_DEPARTMENT_OTHER): Payer: Self-pay | Admitting: Cardiology

## 2021-04-05 ENCOUNTER — Inpatient Hospital Stay (HOSPITAL_COMMUNITY): Admission: RE | Admit: 2021-04-05 | Payer: 59 | Source: Ambulatory Visit

## 2021-04-06 ENCOUNTER — Ambulatory Visit (HOSPITAL_BASED_OUTPATIENT_CLINIC_OR_DEPARTMENT_OTHER): Payer: 59 | Attending: Cardiology | Admitting: Cardiovascular Disease

## 2021-04-06 DIAGNOSIS — G4733 Obstructive sleep apnea (adult) (pediatric): Secondary | ICD-10-CM

## 2021-04-06 DIAGNOSIS — G4734 Idiopathic sleep related nonobstructive alveolar hypoventilation: Secondary | ICD-10-CM

## 2021-04-23 NOTE — Progress Notes (Incomplete)
***In Progress*** ? ?  ?Advanced Heart Failure Clinic Note  ? ?Referring Physician: Dr Harrell Gave  ?Primary Care: Dionisio David NP  ?Primary Cardiologist: Dr Harrell Gave  ? ?HPI:  ? ?Randy Morgan is a 56 year old with  morbid obesity, HTN, OSA, HFpEF, tobacco abuse, and DM2. Dad died from coronary disease at 22 and Mom has heart failure.  ?  ?Had sleep study 10/2019 AHI 80 with desaturations to 68%. Had CPAP titration but had not yet received the machine. ?  ?Last smoked 01/29/21. Previously smoked 1PPD.  ?  ?Admitted 01/30/21 with increased dyspnea in the setting of COVID. Echo showed EF 35-40%  He was volume overloaded and diuresed with IV lasix.  CT was negative for PE. Started on GDMT. Discharge weight was 310 pounds.  ? ?R/L Cath on  02/16/21 ?Normal cors. LVEF 45% ?RA = 12 ?RV = 44/14 ?PA = 40/24 (21) ?PCW = 21 ?Fick cardiac output/index = 8.0/3.3 ?PVR = 0.9 WU ?FA sat = 97% ?PA sat =  78%, 80% ?SVC sat = 79% ?  ?He was last seen by MD in AHF clinic on 03/16/2021 for routine f/u with his wife. He reported fluid was starting to come back with more SOB. Reported his legs swollen and he snored. Stated he had drank a lot of lemonade. Had not received CPAP by this visit.  ?  ?He was seen for cardiology f/u on 04/04/2021. At that visit, he noted weight gain attributable to not taking torsemide since it ran out along with his Jardiance. Weight at this visit was 315 lbs. He also reported more labored breathing from putting on socks. Denied SOB at rest. Stated he will pick up his meds after visit. ?  ?Today, he returns to HF clinic for pharmacist medication titration. At last visit with MD he was swapped to Entresto 49/51 mg BID, and started on spironolactone 25 mg daily and Kcl 20 mEq BID.  ? ? ?Overall feeling ***. ?Dizziness, lightheadedness, fatigue:  ?Chest pain or palpitations: ? ?How is your breathing?: *** ?SOB: ?Able to complete all ADLs. Activity level *** ? ?Weight at home pounds. Takes furosemide/torsemide/bumex ***  mg *** daily.  ?LEE ?PND/Orthopnea ? ?Appetite *** ?Low-salt diet:  ? ?Physical Exam ?Cost/affordability of meds ? ? ?HF Medications: ?Metoprolol succinate 25 mg BID ?Entresto 49-51 mg BID ?Spironolactone 25 mg daily ?Jardiance 10 mg daily ? ?Has the patient been experiencing any side effects to the medications prescribed?  {YES NO:22349} ? ?Does the patient have any problems obtaining medications due to transportation or finances?   {YES NO:22349} ? ?Understanding of regimen: {excellent/good/fair/poor:19665} ?Understanding of indications: {excellent/good/fair/poor:19665} ?Potential of compliance: {excellent/good/fair/poor:19665} ?Patient understands to avoid NSAIDs. ?Patient understands to avoid decongestants. ?  ? ?Pertinent Lab Values: ?03/16/2021 - Serum creatinine 1.18, BUN 15, Potassium 3.9, Sodium 140, BNP 115 ?Vital Signs: ?Weight: *** (last clinic weight: 328.6 lbs) ?Blood pressure: ***  ?Heart rate: ***  ? ?Assessment/Plan: ?1. Chronic systolic HF ?- Echo 06/6292 35-40% LVH mod pericardial effusion. EF previously 55% in 2019.  ?- Cath 01/2021; Normal cors. EF 45% (suspect HTN in nature). Mildly elevated pressures with high CO. ?- NYHA II-III ?- Remains markedly volume overloaded in setting of dietary non-compliance. Increase torsemide to 40 bid ?weight is up / plus fluid overloaded. ?- *** torsemide 40 mg BID ( cards note suggested he increased this but AVS says no changes) ?- ***Continue metoprolol succinate 50 mg daily.  ?-***increase Entresto 97/103 mg BID ?- Continue spironolactone 25 mg daily. ?-  Continue Jardiance 10 mg daily  ?- Continue KCl 20 mEq BID with torsemide. ?  ?2. HTN, Uncontrolled.  ?- titrate meds as above ?  ?3. OSA, severe ?- Needs CPAP and weight loss ?  ?4. DM2 ?- Hgb A1c 6.1  ?- Continue Jardiance 10 mg daily.  ?- Needs follow up with PCP. ?- Consider GLP1RA for weight loss. ?  ?5. Obesity, morbid ?- Body mass index is 53.04 kg/m?. ?- Needs weight loss ?- Consider GLP1RA ?  ?6.  Tobacco Abuse ?- Former heavy smoker.  ?- Quit 01/29/2021 ?  ?  ?Follow up in 3 weeks with APP.  ? ? ? ?Audry Riles, PharmD, BCPS, BCCP, CPP ?Heart Failure Clinic Pharmacist ?[phone] ? ? ?

## 2021-04-24 ENCOUNTER — Other Ambulatory Visit: Payer: Self-pay

## 2021-04-24 ENCOUNTER — Ambulatory Visit (HOSPITAL_COMMUNITY)
Admission: RE | Admit: 2021-04-24 | Discharge: 2021-04-24 | Disposition: A | Discharge status: Home / Self Care | Payer: 59 | Source: Ambulatory Visit | Attending: Cardiology | Admitting: Cardiology

## 2021-04-24 ENCOUNTER — Encounter (HOSPITAL_BASED_OUTPATIENT_CLINIC_OR_DEPARTMENT_OTHER): Payer: Self-pay | Admitting: Cardiovascular Disease

## 2021-04-24 VITALS — BP 148/88 | HR 93 | Wt 324.6 lb

## 2021-04-24 DIAGNOSIS — I5022 Chronic systolic (congestive) heart failure: Secondary | ICD-10-CM | POA: Diagnosis not present

## 2021-04-24 DIAGNOSIS — I3139 Other pericardial effusion (noninflammatory): Secondary | ICD-10-CM | POA: Diagnosis not present

## 2021-04-24 DIAGNOSIS — Z9114 Patient's other noncompliance with medication regimen: Secondary | ICD-10-CM | POA: Diagnosis not present

## 2021-04-24 DIAGNOSIS — E119 Type 2 diabetes mellitus without complications: Secondary | ICD-10-CM | POA: Diagnosis not present

## 2021-04-24 DIAGNOSIS — Z91119 Patient's noncompliance with dietary regimen due to unspecified reason: Secondary | ICD-10-CM | POA: Insufficient documentation

## 2021-04-24 DIAGNOSIS — Z6841 Body Mass Index (BMI) 40.0 and over, adult: Secondary | ICD-10-CM | POA: Diagnosis not present

## 2021-04-24 DIAGNOSIS — Z8616 Personal history of COVID-19: Secondary | ICD-10-CM | POA: Insufficient documentation

## 2021-04-24 DIAGNOSIS — Z79899 Other long term (current) drug therapy: Secondary | ICD-10-CM | POA: Insufficient documentation

## 2021-04-24 DIAGNOSIS — G4733 Obstructive sleep apnea (adult) (pediatric): Secondary | ICD-10-CM | POA: Diagnosis not present

## 2021-04-24 DIAGNOSIS — Z8249 Family history of ischemic heart disease and other diseases of the circulatory system: Secondary | ICD-10-CM | POA: Insufficient documentation

## 2021-04-24 DIAGNOSIS — Z87891 Personal history of nicotine dependence: Secondary | ICD-10-CM | POA: Diagnosis not present

## 2021-04-24 DIAGNOSIS — I110 Hypertensive heart disease with heart failure: Secondary | ICD-10-CM | POA: Insufficient documentation

## 2021-04-24 DIAGNOSIS — Z7984 Long term (current) use of oral hypoglycemic drugs: Secondary | ICD-10-CM | POA: Insufficient documentation

## 2021-04-24 LAB — BASIC METABOLIC PANEL
Anion gap: 8 (ref 5–15)
BUN: 17 mg/dL (ref 6–20)
CO2: 31 mmol/L (ref 22–32)
Calcium: 8.9 mg/dL (ref 8.9–10.3)
Chloride: 102 mmol/L (ref 98–111)
Creatinine, Ser: 1.32 mg/dL — ABNORMAL HIGH (ref 0.61–1.24)
GFR, Estimated: >60 mL/min (ref >60)
Glucose, Bld: 116 mg/dL — ABNORMAL HIGH (ref 70–99)
Potassium: 4.1 mmol/L (ref 3.5–5.1)
Sodium: 141 mmol/L (ref 135–145)

## 2021-04-24 LAB — BRAIN NATRIURETIC PEPTIDE: B Natriuretic Peptide: 60.4 pg/mL (ref 0.0–100.0)

## 2021-04-24 MED ORDER — SACUBITRIL-VALSARTAN 97-103 MG PO TABS: 1.0000 | ORAL_TABLET | Freq: Two times a day (BID) | ORAL | 11 refills | Status: DC

## 2021-04-24 NOTE — Progress Notes (Signed)
? ?  ?Advanced Heart Failure Clinic Note  ? ?Referring Physician: Dr Harrell Gave  ?Primary Care: Dionisio David NP  ?Primary Cardiologist: Dr Harrell Gave  ?HF Cardiologist: Dr. Haroldine Laws ? ?HPI:  ? ?Randy Morgan is a 56 year old with morbid obesity, HTN, OSA, HFrEF, tobacco abuse, and DM2. Dad died from coronary disease at 37 and Mom has heart failure.  ?  ?Had sleep study 10/2019 AHI 80 with desaturations to 68%. Had CPAP titration but had not yet received the machine. ?  ?Last smoked 01/29/21. Previously smoked 1PPD.  ?  ?Admitted 01/30/21 with increased dyspnea in the setting of COVID. Echo showed EF 35-40%  He was volume overloaded and diuresed with IV Lasix.  CT was negative for PE. Started on GDMT. Discharge weight was 310 pounds.  ? ?R/L Cath on 02/16/21 ?Normal cors. LVEF 45% ?RA = 12 ?RV = 44/14 ?PA = 40/24 (21) ?PCW = 21 ?Fick cardiac output/index = 8.0/3.3 ?PVR = 0.9 WU ?FA sat = 97% ?PA sat =  78%, 80% ?SVC sat = 79% ?  ?He was last seen in the AHF clinic on 03/16/2021 for routine f/u with his wife. He reported fluid was starting to come back with more SOB. Reported his legs were swollen and he snored. Stated he tended to drink a lot of lemonade. Had not received CPAP by this visit.  ?  ?He was seen for cardiology f/u on 04/04/2021. At that visit, he noted weight gain attributable to not taking torsemide since it ran out along with his Jardiance. Weight at this visit was 334 lbs. He also reported more labored breathing from putting on socks. Denied SOB at rest. Stated he would pick up his meds after visit. ?  ?Today, he returns to HF clinic for pharmacist medication titration. At last visit with MD, losartan was discontinued and Entresto 49/51 mg BID was initiated. Additionally spironolactone 25 mg daily and KCl 20 mEq BID were initiated. Overall feeling well today. Notes his wife helps to manage his medications in a pillbox which she fills every Sunday. Reports occasional dizziness when he stands up. Denies chest  pain. Reports difficulty breathing exacerbated by allergies. He reports SOB and difficulty breathing when walking up stairs or completing household chores. He states he was SOB walking from the entrance of the clinic to the clinic room this visit. He reports his dry weight at home is 310 lbs. Weight in clinic is 324 lbs today, which is 4 lbs less than when he was last seen in this clinic. He takes torsemide 40 mg BID but has issues with compliance. States he will occasionally miss the evening dose of torsemide (~3x/week) and he stated he had missed all of his torsemide doses this past weekend. Legs are swollen but exam is difficult due to body habitus. Denies orthopnea. 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 Has not been restricting his fluid intake or following a low sodium diet.  ? ? ?HF Medications: ?Metoprolol succinate 25 mg daily ?Entresto 49-51 mg BID ?Spironolactone 25 mg daily ?Jardiance 10 mg daily ?Torsemide 40 mg BID ?KCl 20 mEq BID ? ?Has the patient been experiencing any side effects to the medications prescribed?  no ? ?Does the patient have any problems obtaining medications due to transportation or finances?  No  ? ?Understanding of regimen: poor ?Understanding of indications: poor ?Potential of compliance: poor ?Patient understands to avoid NSAIDs. ?Patient  understands to avoid decongestants. ?  ? ?Pertinent Lab Values: ?03/16/2021 - Serum creatinine 1.18, BUN 15, Potassium 3.9, Sodium 140, BNP 115 ? ?Vital Signs: ?Weight: 324 lbs (last clinic weight: 328.6 lbs) ?Blood pressure: 148/88  ?Heart rate: 88  ? ?Assessment/Plan: ?1. Chronic systolic HF ?- Echo 04/5807 35-40% LVH mod pericardial effusion. EF previously 55% in 2019.  ?- Cath 01/2021; Normal cors. EF 45% (suspect HTN in nature). Mildly elevated pressures with high CO. ?- NYHA II-III. Remains volume overloaded in setting of medication and dietary non-compliance.  ?-  BMET, BNP pending ?- Continue torsemide 40 mg BID. Reinforced the need for compliance.  ?- Continue metoprolol succinate 25 mg daily.  ?- Increase Entresto to 97/103 mg BID. Repeat BMET in 3 weeks.  ?- Continue spironolactone 25 mg daily. ?- Continue Jardiance 10 mg daily  ?- Again reinforced the need for medication compliance and sodium/fluid restriction.  ?  ?2. HTN, Uncontrolled.  ?- Increase Entresto as above ?  ?3. OSA, severe ?- Needs CPAP and weight loss ?  ?4. DM2 ?- Hgb A1c 6.1  ?- Continue Jardiance 10 mg daily.  ?- Needs follow up with PCP. ?- Consider GLP1RA for weight loss. ?  ?5. Obesity, morbid ?- Body mass index is 53.04 kg/m?. ?- Needs weight loss ?- Consider GLP1RA ?  ?6. Tobacco Abuse ?- Former heavy smoker.  ?- Quit 01/29/2021 ?  ?  ?Follow up in 3 weeks with APP and BMET..  ? ? ?Audry Riles, PharmD, BCPS, BCCP, CPP ?Heart Failure Clinic Pharmacist ?[phone] ? ?

## 2021-04-24 NOTE — Patient Instructions (Addendum)
It was a pleasure seeing you today! ? ?MEDICATIONS: ?-We are changing your medications today ?-Increase Entresto to 97/103 mg (1 tablet) twice daily. You may take 2 tablets of the 49/51 mg strength twice daily until you pick up the new strength.  ?-Call if you have questions about your medications. ? ?LABS: ?-We will call you if your labs need attention. ? ?NEXT APPOINTMENT: ?Return to clinic in 3 weeks with APP Clinic. ? ?In general, to take care of your heart failure: ?-Limit your fluid intake to 2 Liters (half-gallon) per day.   ?-Limit your salt intake to ideally 2-3 grams (2000-3000 mg) per day. ?-Weigh yourself daily and record, and bring that "weight diary" to your next appointment.  (Weight gain of 2-3 pounds in 1 day typically means fluid weight.) ?-The medications for your heart are to help your heart and help you live longer.   ?-Please contact us before stopping any of your heart medications. ? ?Call the clinic at [phone] with questions or to reschedule future appointments.  ? ?

## 2021-04-24 NOTE — Procedures (Signed)
? ? ?  Patient Name: Randy Morgan, Randy Morgan ?Study Date: 04/09/2021 ?Gender: Male ?D.O.B: 11/30/1965 ?Age (years): 52 ?Referring Provider: Dionisio David NP ?Height (inches): 66 ?Interpreting Physician: Shelva Majestic MD, ABSM ?Weight (lbs): 310 ?RPSGT: Jacolyn Reedy ?BMI: 50 ?MRN: [id number] ?Neck Size: 19.00 ? ?CLINICAL INFORMATION ?Sleep Study Type: HST ? ?Indication for sleep study: Weight Gain, Witnessed Apneas, Witnesses Apnea / Gasping During Sleep ? ?Epworth Sleepiness Score: 13 ? ?Most recent polysomnogram 08/27/2019: AHI 80.6/h, O2 nadir 68%. Most recent titration study dated 11/19/2019 was optimal at 15 cm H2O with supplemental O2 at 2l/min recommendation. NSVT was noted during the study. ? ?SLEEP STUDY TECHNIQUE ?A multi-channel overnight portable sleep study was performed. The channels recorded were: nasal airflow, thoracic respiratory movement, and oxygen saturation with a pulse oximetry. Snoring was also monitored. ? ?MEDICATIONS ?empagliflozin (JARDIANCE) 10 MG TABS tablet ?metoprolol succinate (TOPROL-XL) 25 MG 24 hr tablet ?potassium chloride SA (KLOR-CON M) 20 MEQ tablet ?sacubitril-valsartan (ENTRESTO) 97-103 MG ?spironolactone (ALDACTONE) 25 MG tablet ?torsemide (DEMADEX) 20 MG tablet ?  ?Patient self administered medications include: N/A. ? ?SLEEP ARCHITECTURE ?Patient was studied for 368.5 minutes. The sleep efficiency was 100.0 % and the patient was supine for 91.5%. The arousal index was 0.0 per hour. ? ?RESPIRATORY PARAMETERS ?The overall AHI was 86.1 per hour, with a central apnea index of 0 per hour. ? ?The oxygen nadir was 79% during sleep. ? ?CARDIAC DATA ?Mean heart rate during sleep was 80.6 bpm. ? ?IMPRESSIONS ?- Severe obstructive sleep apnea occurred during this study (AHI 86.1/h). There is a positional component with supine sleep AHI 92/h versus non-supine AHI 38.7/h. ?- Severe oxygen desaturation was noted during this study (Min O2 79%).  Time spent < 89 was 50.1 minutes. ?- Patient  snored 18.2% during the sleep. ? ?DIAGNOSIS ?- Obstructive Sleep Apnea (G47.33) ?- Nocturnal Hypoxemia (G47.36) ? ?RECOMMENDATIONS ?- In this patient with significant cardiovascular comorbidities and severe sleep apnea with previous supplemental O2 recommendation at 2 l/m recommend an in-lab titration study. If unable to obtain an in-lab study, initiate Auto - PAP with EPR of 3 at 14 - 20 and overnight oximetry evaluation. ?- Positional therapy avoiding supine position during sleep. ?- Avoid alcohol, sedatives and other CNS depressants that may worsen sleep apnea and disrupt normal sleep architecture. ?- Sleep hygiene should be reviewed to assess factors that may improve sleep quality. ?- Weight management (BMI 50) and regular exercise should be initiated or continued. ?- Recommend a downloiad and sleep clinic evaluation after one month of therapy. ? ? ?[Electronically signed] 04/24/2021 02:38 PM ? ?Shelva Majestic MD, [facility], ABSM ?[place], [place] Board of Sleep Medicine ? ? ?NPI: [id number] ? ?[place] ?PH: [phone]   FX: [phone] ?ACCREDITED BY THE AMERICAN ACADEMY OF SLEEP MEDICINE ? ?

## 2021-05-01 ENCOUNTER — Ambulatory Visit (HOSPITAL_BASED_OUTPATIENT_CLINIC_OR_DEPARTMENT_OTHER): Payer: 59 | Admitting: Cardiology

## 2021-05-01 ENCOUNTER — Telehealth: Payer: Self-pay | Admitting: *Deleted

## 2021-05-01 NOTE — Telephone Encounter (Signed)
Message left to return a call to discuss sleep study results and recommendations. 

## 2021-05-01 NOTE — Telephone Encounter (Signed)
-----   Message from Troy Sine, MD sent at 04/24/2021  2:43 PM EDT ----- ?Mariann Laster, please notify pt; schedule in-lab titration or Auto-PAP ?

## 2021-05-03 ENCOUNTER — Encounter (HOSPITAL_BASED_OUTPATIENT_CLINIC_OR_DEPARTMENT_OTHER): Payer: Self-pay | Admitting: Family

## 2021-05-03 ENCOUNTER — Ambulatory Visit (INDEPENDENT_AMBULATORY_CARE_PROVIDER_SITE_OTHER): Payer: 59 | Admitting: Family

## 2021-05-03 ENCOUNTER — Other Ambulatory Visit: Payer: Self-pay | Admitting: Cardiovascular Disease

## 2021-05-03 VITALS — BP 115/78 | HR 94 | Ht 66.0 in | Wt 320.6 lb

## 2021-05-03 DIAGNOSIS — I428 Other cardiomyopathies: Secondary | ICD-10-CM | POA: Diagnosis not present

## 2021-05-03 DIAGNOSIS — G4736 Sleep related hypoventilation in conditions classified elsewhere: Secondary | ICD-10-CM

## 2021-05-03 DIAGNOSIS — I5042 Chronic combined systolic (congestive) and diastolic (congestive) heart failure: Secondary | ICD-10-CM

## 2021-05-03 DIAGNOSIS — G4733 Obstructive sleep apnea (adult) (pediatric): Secondary | ICD-10-CM

## 2021-05-03 DIAGNOSIS — I10 Essential (primary) hypertension: Secondary | ICD-10-CM | POA: Diagnosis not present

## 2021-05-03 NOTE — Telephone Encounter (Signed)
Patient returned a call and was given sleep study results and recommendations. ?

## 2021-05-03 NOTE — Telephone Encounter (Signed)
-----   Message from Troy Sine, MD sent at 04/24/2021  2:43 PM EDT ----- ?Mariann Laster, please notify pt; schedule in-lab titration or Auto-PAP ?

## 2021-05-03 NOTE — Progress Notes (Signed)
? ?Office Visit  ?  ?Patient Name: Randy Morgan ?Date of Encounter: 05/03/2021 ? ?PCP:  Vevelyn Francois, NP ?  ?[place]  ?Cardiologist:  Buford Dresser, MD  ?Advanced Practice Provider:  No care team member to display ?Electrophysiologist:  None  ? ? ?Chief Complaint  ?  ?Randy Morgan is a 56 y.o. male with a hx of morbid obesity, hypertension, OSA, HFrEF, tobacco use, DM2 presents today for heart failure follow up.   ? ?Past Medical History  ?  ?Past Medical History:  ?Diagnosis Date  ? Asthma   ? CHF (congestive heart failure) ([place])   ? Hypertension   ? Sleep apnea   ? hasn't started CPAP yet  ? ?Past Surgical History:  ?Procedure Laterality Date  ? NO PAST SURGERIES    ? RIGHT/LEFT HEART CATH AND CORONARY ANGIOGRAPHY N/A 02/16/2021  ? Procedure: RIGHT/LEFT HEART CATH AND CORONARY ANGIOGRAPHY;  Surgeon: Jolaine Artist, MD;  Location: [facility];  Service: Cardiovascular;  Laterality: N/A;  ? VIDEO BRONCHOSCOPY Bilateral 12/09/2017  ? Procedure: VIDEO BRONCHOSCOPY WITHOUT FLUORO;  Surgeon: Laurin Coder, MD;  Location: [facility];  Service: Cardiopulmonary;  Laterality: Bilateral;  ? ? ?Allergies ? ?No Known Allergies ? ?History of Present Illness  ?  ?Randy Morgan is a 56 y.o. male with a hx of morbid obesity, hypertension, OSA, HFrEF, tobacco use, DVT, OSA last seen 04/16/2021 by pharmacy clinic at advanced heart failure clinic.. ? ?He was able to quit smoking January 2023.  Admission January 2023 with a EF 35-40%, increased dyspnea in the setting of COVID.  He was diuresed with IV Lasix.  CT negative for PE.  GDMT initiated.  Discharge weight 310 pounds.  Cardiac cath 02/16/2021 with normal coronary arteries, LVEF 45%.  He follows with the advanced heart failure clinic.  He has had intermittent compliance with his medications. ? ?He was last seen 04/16/2021 by advanced heart failure pharmacist and recommended to increase his Entresto 97-103 mg twice  daily.  He presents today for follow-up.  Notes he has not yet picked up the new prescription for Entresto and was educated to do so.  He notes urinary frequency due to torsemide and we discussed that hopefully with increased dose of Entresto will require less diuresis.  Takes his morning dose of torsemide between 9 AM and second dose around 6 PM.  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  He is interested in pursuing FMLA for assistance with getting to appointments, exacerbations and we discussed that he would need to obtain paperwork from his employer.  He notes no chest pain.  His exertional dyspnea, orthopnea, PND are stable.  He notes the lightheadedness and dizziness on occasion with position changes.  No near-syncope nor syncope.  He is not monitoring blood pressure at home as he does not have a cuff.  He was contacted earlier this week by sleep study team with recommendations for AutoPap versus an in lab titration study but they had to leave a voicemail and he was encouraged to call them back. ? ? ?EKGs/Labs/Other Studies Reviewed:  ? ?The following studies were reviewed today: ? ? ?EKG: No EKG today ? ?Recent Labs: ?02/01/2021: ALT 23; TSH 0.502 ?02/03/2021: Magnesium 2.4 ?02/09/2021: Platelets 186 ?02/16/2021: Hemoglobin 11.9 ?04/24/2021: B Natriuretic Peptide 60.4; BUN 17; Creatinine, Ser 1.32; Potassium 4.1; Sodium 141  ?Recent Lipid Panel ?   ?Component Value Date/Time  ? CHOL  166 02/03/2021 0327  ? CHOL 220 (H) 03/02/2020 1034  ? TRIG 160 (H) 02/03/2021 0327  ? HDL 36 (L) 02/03/2021 0327  ? HDL 36 (L) 03/02/2020 1034  ? CHOLHDL 4.6 02/03/2021 0327  ? VLDL 32 02/03/2021 0327  ? [place] 98 02/03/2021 0327  ? [place] 154 (H) 03/02/2020 1034  ? ? ?Home Medications  ? ?Current Meds  ?Medication Sig  ? empagliflozin (JARDIANCE) 10 MG TABS tablet Take 1 tablet (10 mg total) by mouth daily.  ? metoprolol succinate (TOPROL-XL) 25 MG 24 hr tablet Take 1 tablet (25 mg total) by  mouth daily.  ? potassium chloride SA (KLOR-CON M) 20 MEQ tablet Take 1 tablet (20 mEq total) by mouth 2 (two) times daily.  ? sacubitril-valsartan (ENTRESTO) 97-103 MG Take 1 tablet by mouth 2 (two) times daily.  ? spironolactone (ALDACTONE) 25 MG tablet Take 1 tablet (25 mg total) by mouth daily.  ? torsemide (DEMADEX) 20 MG tablet Take 2 tablets (40 mg total) by mouth 2 (two) times daily.  ?  ? ?Review of Systems  ?    ?All other systems reviewed and are otherwise negative except as noted above. ? ?Physical Exam  ?  ?VS:  BP 115/78   Pulse 94   Ht 5' 6" (1.676 m)   Wt (!) 320 lb 9.6 oz (145.4 kg)   SpO2 97%   BMI 51.75 kg/m?  , BMI Body mass index is 51.75 kg/m?. ? ?Wt Readings from Last 3 Encounters:  ?05/03/21 (!) 320 lb 9.6 oz (145.4 kg)  ?04/24/21 (!) 324 lb 9.6 oz (147.2 kg)  ?04/04/21 (!) 334 lb 1.6 oz (151.5 kg)  ?  ? ?GEN: Well nourished, overweight, well developed, in no acute distress. ?HEENT: normal. ?Neck: Supple, no JVD, carotid bruits, or masses. ?Cardiac: RRR, no murmurs, rubs, or gallops. No clubbing, cyanosis. Nonpitting bilateral LE edema. .  Radials/PT 2+ and equal bilaterally.  ?Respiratory:  Respirations regular and unlabored, clear to auscultation bilaterally. ?GI: Soft, nontender, nondistended. ?MS: No deformity or atrophy. ?Skin: Warm and dry, no rash. ?Neuro:  Strength and sensation are intact. ?Psych: Normal affect. ? ?Assessment & Plan  ?  ?HFrEF -volume status difficulty to ascertain due to obesity.  Weight down additional 4 pounds from clinic visit last week at heart failure clinic.  He has not yet picked up Entresto 97-103 mg tablet and was encouraged to do so.  Continue potassium, spironolactone, torsemide, Toprol, Jardiance at current doses. Low sodium diet, fluid restriction <2L, and daily weights encouraged. Educated to contact our office for weight gain of 2 lbs overnight or 5 lbs in one week.  Upcoming clinic visit with the advanced heart failure team with be met to be  collected at that time for monitoring of renal function on the increased dose Entresto. ? ?Hypertension -BP at goal today.  Increase Entresto for heart failure management as previously recommended by pharmacist at the heart failure clinic.  Encouraged to purchase upper arm blood pressure cuff for monitoring of blood pressure ? ?Severe OSA -sleep study team called him earlier this week to set up AutoPap versus an in lab titration study.  He was provided their phone number to call them back today to facilitate.  We discussed that OSA management would assist in strengthening his heart as well as with weight loss. ? ?Morbid obesity  - Weight loss via diet and exercise encouraged. Discussed the impact being overweight would have on cardiovascular risk.  Potential candidate for GLP-1 for weight  loss.  Encouraged to start exercise routine.  ? ?DM2 -A1c 6.1.  Continue Jardiance 10 mg daily.  Managed by PCP.  Consider GLP-1 such as Ozempic for weight loss, diabetes, cardioprotective benefit. ? ?Tobacco abuse -former heavy smoker having quit 01/29/2021.  Continued cessation encouraged. ? ? ?Disposition: Follow up as scheduled with the advanced heart failure team. ? ?Signed, ?Loel Dubonnet, NP ?05/03/2021, 7:22 PM ?[place] ?

## 2021-05-03 NOTE — Patient Instructions (Addendum)
Medication Instructions:  ?Continue your current medications.  ? ?Recommend picking up the stronger Entresto from the pharmacy.  ? ?*If you need a refill on your cardiac medications before your next appointment, please call your pharmacy* ? ?Lab Work: ?None ordered today.  ? ?Testing/Procedures: ?None ordered today.  ? ?Follow-Up: ?At [facility], you and your health needs are our priority.  As part of our continuing mission to provide you with exceptional heart care, we have created designated Provider Care Teams.  These Care Teams include your primary Cardiologist (physician) and Advanced Practice Providers (APPs -  Physician Assistants and Nurse Practitioners) who all work together to provide you with the care you need, when you need it. ? ?We recommend signing up for the patient portal called "MyChart".  Sign up information is provided on this After Visit Summary.  MyChart is used to connect with patients for Virtual Visits (Telemedicine).  Patients are able to view lab/test results, encounter notes, upcoming appointments, etc.  Non-urgent messages can be sent to your provider as well.   ?To learn more about what you can do with MyChart, go to [email or web address].   ? ?Your next appointment:   ?As scheduled with the Advanced Heart Failure Team ? ?Other Instructions ? ?Please check your voicemail and call Mariann Laster back regarding your sleep study.  ? ?Your provider recommends purchasing an upper arm automatic blood pressure cuff for home monitoring of blood pressure. These can be purchased from your pharmacy, [employer], [place], etc. Please do not purchase a wrist cuff as they are often inaccurate. [employer] is a well-trusted brand for an upper arm cuff and the cost should around $35.  ? ?Heart Healthy Diet Recommendations: ?A low-salt diet is recommended. Meats should be grilled, baked, or boiled. Avoid fried foods. Focus on lean protein sources like fish or chicken with vegetables and fruits. The American Heart  Association is a Microbiologist!  American Heart Association Diet and Lifeystyle Recommendations   ?Drink less than 2 liters (64oz) of fluid per day.  ? ?Exercise recommendations: ?The American Heart Association recommends 150 minutes of moderate intensity exercise weekly. ?Try 30 minutes of moderate intensity exercise 4-5 times per week. ?This could include walking, jogging, or swimming.   ?

## 2021-05-14 ENCOUNTER — Encounter (HOSPITAL_COMMUNITY): Payer: 59

## 2021-05-14 NOTE — Progress Notes (Incomplete)
? ? ? ? ?ADVANCED HF CLINIC NOTE  ? ? ? ?Referring Physician: Dr Harrell Gave  ?Primary Care: Dionisio David NP  ?Primary Cardiologist: Dr Harrell Gave  ? ?HPI: ?Referred to clinic by Dr Harrell Gave for heart failure consultation.  ? ?Mr Florea is a 56  year old with  morbid obesity, HTN, OSA, HFpEF, tobacco abuse, and DM2. Dad died from coronary disease at 41 and Mom has heart failure.  ? ?Had sleep study 10/2019 AHI 80.with desaturations to 68%. Had CPAP titration but has never received the machine. ? ?Last smoked 01/29/21. Previously smoking 1PPD.  ? ?Admitted 01/30/21 with increased dyspnea in the setting of COVID. Echo EF 35-40%  He was volume overloaded and diuresed with IV lasix.  CT negative for PE. Started on GDMT. Discharge weight 310 pounds. Seen in [facility] and set up for cath.  ? ?R/L Cath 02/16/21 ?Normal cors. LVEF 45% ?RA = 12 ?RV = 44/14 ?PA = 40/24 (21) ?PCW = 21 ?Fick cardiac output/index = 8.0/3.3 ?PVR = 0.9 WU ?FA sat = 97% ?PA sat =  78%, 80% ?SVC sat = 79% ? ?Here for routine f/u with his wife. Says fluid is starting to come back. More SOB. Legs swollen. + snoring. Drinking a lot of lemonade. Has not get CPAP yet.  ? ?Cardiac Testing  ?Echo 2019 55-60% Grade I DD Mild LVH. Trivial pericardial effusion.  ?Echo 01/2021 EF 35-40% Moderate pericardial effusion. Severe LVH.  ? ? ? ?Past Medical History:  ?Diagnosis Date  ? Asthma   ? CHF (congestive heart failure) ([place])   ? Hypertension   ? Sleep apnea   ? hasn't started CPAP yet  ? ? ?Current Outpatient Medications  ?Medication Sig Dispense Refill  ? empagliflozin (JARDIANCE) 10 MG TABS tablet Take 1 tablet (10 mg total) by mouth daily. 30 tablet 11  ? metoprolol succinate (TOPROL-XL) 25 MG 24 hr tablet Take 1 tablet (25 mg total) by mouth daily. 30 tablet 11  ? potassium chloride SA (KLOR-CON M) 20 MEQ tablet Take 1 tablet (20 mEq total) by mouth 2 (two) times daily. 60 tablet 11  ? sacubitril-valsartan (ENTRESTO) 97-103 MG Take 1 tablet by mouth 2 (two)  times daily. 60 tablet 11  ? spironolactone (ALDACTONE) 25 MG tablet Take 1 tablet (25 mg total) by mouth daily. 30 tablet 11  ? torsemide (DEMADEX) 20 MG tablet Take 2 tablets (40 mg total) by mouth 2 (two) times daily. 120 tablet 11  ? ?No current facility-administered medications for this visit.  ? ? ?No Known Allergies ? ?  ?Social History  ? ?Socioeconomic History  ? Marital status: Married  ?  Spouse name: Not on file  ? Number of children: 2  ? Years of education: 12th grade + 2 years college  ? Highest education level: Some college, no degree  ?Occupational History  ? Occupation: makes concrete blocks and bricks  ? Occupation: Freight forwarder  ?Tobacco Use  ? Smoking status: Former  ?  Packs/day: 0.50  ?  Years: 25.00  ?  Pack years: 12.50  ?  Types: Cigarettes  ?  Quit date: 02/25/2021  ?  Years since quitting: 0.2  ? Smokeless tobacco: Never  ?Vaping Use  ? Vaping Use: Never used  ?Substance and Sexual Activity  ? Alcohol use: Not Currently  ?  Alcohol/week: 2.0 standard drinks  ?  Types: 2 Cans of beer per week  ?  Comment: occ beer  ? Drug use: No  ? Sexual activity:  Not Currently  ?Other Topics Concern  ? Not on file  ?Social History Narrative  ? Lives with his wife and daughter.  ? Other child lives independently in [place].  ? Drinks about 3 sodas a day   ?   ? Pt stated he quit smoking  ? ?Social Determinants of Health  ? ?Financial Resource Strain: High Risk  ? Difficulty of Paying Living Expenses: Hard  ?Food Insecurity: No Food Insecurity  ? Worried About Charity fundraiser in the Last Year: Never true  ? Ran Out of Food in the Last Year: Never true  ?Transportation Needs: No Transportation Needs  ? Lack of Transportation (Medical): No  ? Lack of Transportation (Non-Medical): No  ?Physical Activity: Not on file  ?Stress: Not on file  ?Social Connections: Not on file  ?Intimate Partner Violence: Not on file  ? ? ?  ?Family History  ?Problem Relation Age of Onset  ? Heart disease Mother   ?  Diabetes Mother   ? Asthma Mother   ? Heart disease Father   ? Asthma Sister   ? Heart disease Brother   ? Heart attack Brother   ? Colon cancer Neg Hx   ? Esophageal cancer Neg Hx   ? Rectal cancer Neg Hx   ? Stomach cancer Neg Hx   ? ? ?There were no vitals filed for this visit. ? ? ?Wt Readings from Last 3 Encounters:  ?05/03/21 (!) 145.4 kg (320 lb 9.6 oz)  ?04/24/21 (!) 147.2 kg (324 lb 9.6 oz)  ?04/04/21 (!) 151.5 kg (334 lb 1.6 oz)  ? ? ?PHYSICAL EXAM: ?General:  Well appearing. No respiratory difficulty ?HEENT: normal ?Neck: supple. no JVD. Carotids 2+ bilat; no bruits. No lymphadenopathy or thyromegaly appreciated. ?Cor: PMI nondisplaced. Regular rate & rhythm. No rubs, gallops or murmurs. ?Lungs: clear ?Abdomen: soft, nontender, nondistended. No hepatosplenomegaly. No bruits or masses. Good bowel sounds. ?Extremities: no cyanosis, clubbing, rash, edema ?Neuro: alert & oriented x 3, cranial nerves grossly intact. moves all 4 extremities w/o difficulty. Affect pleasant. ? ? ? ? ?ASSESSMENT & PLAN: ? ?1. Chronic systolic HF ?- Echo 5/17 00-17% LVH mod pericardial effusion. EF previously 55% in 2019.  ?- Cath 1/23; Normal cors. EF 45% (suspect HTN in nature). Mildly elevated pressures with high CO ?- NYHA II-III ?- Remains markedly volume overloaded in setting of dietary non-compliance. Increase torsemide to 40 bid ?- Discussed need for dietary complaince ?- Continue Toprol 25 mg daily  ?- Switch losartan to Entresto 49/51 ?- Add spiro 25  ?- Add k 20 bid ?- Continue jardiance 10 mg daily  ?- Enroll in PharmD med titration program ? ?2. HTN, Uncontrolled.  ?- titrate meds as above ? ?3. OSA, severe ?- Needs CPAP  and weight loss ? ?4. DM2 ?- Hgb A1C 6.1  ?- Jardiance 10 mg daily  ?- Needs follow up with PCP  ?- Consider GLP1RA ? ?5. Obesity, morbid ?- There is no height or weight on file to calculate BMI. ?- Needs weight loss ?- Consider GLP1RA ? ?6. Tobacco Abuse ?- Former heavy smoker.  ?- Quit  01/29/21 ? ?Total time spent > 45 minutes. Over half that time spent discussing above.  ? ? ?Lyda Jester, PA-C  ?8:43 AM ? ? ? ? ?

## 2021-05-17 ENCOUNTER — Encounter (HOSPITAL_COMMUNITY): Payer: Self-pay

## 2021-05-17 ENCOUNTER — Ambulatory Visit (HOSPITAL_COMMUNITY)
Admission: RE | Admit: 2021-05-17 | Discharge: 2021-05-17 | Disposition: A | Discharge status: Home / Self Care | Payer: 59 | Source: Ambulatory Visit | Attending: Family Medicine | Admitting: Family Medicine

## 2021-05-17 VITALS — BP 156/90 | HR 94 | Wt 319.6 lb

## 2021-05-17 DIAGNOSIS — G4733 Obstructive sleep apnea (adult) (pediatric): Secondary | ICD-10-CM | POA: Diagnosis not present

## 2021-05-17 DIAGNOSIS — I5023 Acute on chronic systolic (congestive) heart failure: Secondary | ICD-10-CM | POA: Diagnosis present

## 2021-05-17 DIAGNOSIS — I5042 Chronic combined systolic (congestive) and diastolic (congestive) heart failure: Secondary | ICD-10-CM

## 2021-05-17 DIAGNOSIS — Z7984 Long term (current) use of oral hypoglycemic drugs: Secondary | ICD-10-CM | POA: Insufficient documentation

## 2021-05-17 DIAGNOSIS — I110 Hypertensive heart disease with heart failure: Secondary | ICD-10-CM | POA: Insufficient documentation

## 2021-05-17 DIAGNOSIS — Z8616 Personal history of COVID-19: Secondary | ICD-10-CM | POA: Insufficient documentation

## 2021-05-17 DIAGNOSIS — Z139 Encounter for screening, unspecified: Secondary | ICD-10-CM

## 2021-05-17 DIAGNOSIS — I10 Essential (primary) hypertension: Secondary | ICD-10-CM

## 2021-05-17 DIAGNOSIS — Z8249 Family history of ischemic heart disease and other diseases of the circulatory system: Secondary | ICD-10-CM | POA: Diagnosis not present

## 2021-05-17 DIAGNOSIS — Z6841 Body Mass Index (BMI) 40.0 and over, adult: Secondary | ICD-10-CM | POA: Diagnosis not present

## 2021-05-17 DIAGNOSIS — F17210 Nicotine dependence, cigarettes, uncomplicated: Secondary | ICD-10-CM | POA: Diagnosis not present

## 2021-05-17 DIAGNOSIS — Z79899 Other long term (current) drug therapy: Secondary | ICD-10-CM | POA: Diagnosis not present

## 2021-05-17 DIAGNOSIS — E119 Type 2 diabetes mellitus without complications: Secondary | ICD-10-CM | POA: Insufficient documentation

## 2021-05-17 DIAGNOSIS — Z720 Tobacco use: Secondary | ICD-10-CM

## 2021-05-17 LAB — BASIC METABOLIC PANEL
Anion gap: 9 (ref 5–15)
BUN: 14 mg/dL (ref 6–20)
CO2: 32 mmol/L (ref 22–32)
Calcium: 8.5 mg/dL — ABNORMAL LOW (ref 8.9–10.3)
Chloride: 100 mmol/L (ref 98–111)
Creatinine, Ser: 1.17 mg/dL (ref 0.61–1.24)
GFR, Estimated: >60 mL/min (ref >60)
Glucose, Bld: 126 mg/dL — ABNORMAL HIGH (ref 70–99)
Potassium: 3.2 mmol/L — ABNORMAL LOW (ref 3.5–5.1)
Sodium: 141 mmol/L (ref 135–145)

## 2021-05-17 LAB — BRAIN NATRIURETIC PEPTIDE: B Natriuretic Peptide: 40.2 pg/mL (ref 0.0–100.0)

## 2021-05-17 MED ORDER — METOLAZONE 2.5 MG PO TABS: 2.5000 mg | ORAL_TABLET | Freq: Every day | ORAL | 0 refills | Status: DC

## 2021-05-17 MED ORDER — EPLERENONE 50 MG PO TABS: 50.0000 mg | ORAL_TABLET | Freq: Every day | ORAL | 6 refills | Status: DC

## 2021-05-17 NOTE — Progress Notes (Signed)
?Heart and Vascular Care Navigation ? ?05/17/2021 ? ?Randy Morgan ?1965/07/26 ?[id number] ? ?Reason for Referral: to discuss disability ?  ?Engaged with patient face to face for initial visit for Heart and Vascular Care Coordination. ?                                                                                                  ?Assessment: Pt requested to discuss applying for disability with pt.  Pt has been struggling to work recently with adjustment of diuretics and frequent urination- has had to take lots of bathroom breaks and had to leave work for a while one day following an accident.  Is worried about losing his job as well as expressing struggles continuing working in his current position like this.   ? ?Due to pt current medical condition informed pt is is unlikely that he would be approved for disability based on his heart failure alone (EF is 30-35% disability guidelines requires 25% or below).  Provided pt packet on applying for disability if truly feels as if he is unable to work all together but told him realistic timeline of how long that determination takes and that unless he has other disabling conditions it is unlikely to be approved. ? ?Encouraged pt to work with employer to create more accommodating work environment.  Clinic providing letter today informing of medical need for frequent bathroom breaks.  CSW informed pt he need to inquire about applying for intermittent FLMA so that he can get excused for absences due to appts and medical concerns.  He will speak with supervisor about how to start this process.                               ? ?HRT/VAS Care Coordination   ? ? Lives with: Spouse  ? Home Assistive Devices/Equipment None  ? ?  ? ? ?Social History:                                                                             ?SDOH Screenings  ? ?Alcohol Screen: Low Risk   ? Last Alcohol Screening Score (AUDIT): 1  ?Depression (PHQ2-9): Low Risk   ? PHQ-2 Score: 4  ?Financial  Resource Strain: High Risk  ? Difficulty of Paying Living Expenses: Hard  ?Food Insecurity: No Food Insecurity  ? Worried About Charity fundraiser in the Last Year: Never true  ? Ran Out of Food in the Last Year: Never true  ?Housing: Low Risk   ? Last Housing Risk Score: 0  ?Physical Activity: Not on file  ?Social Connections: Not on file  ?Stress: Not on file  ?Tobacco Use: Medium Risk  ? Smoking Tobacco Use: Former  ? Smokeless Tobacco Use: Never  ?  Passive Exposure: Not on file  ?Transportation Needs: No Transportation Needs  ? Lack of Transportation (Medical): No  ? Lack of Transportation (Non-Medical): No  ? ? ?SDOH Interventions: ?Financial Resources:    ?Social Security for Disability application assistance  ?Food Insecurity:   N/a  ?Housing Insecurity:   N/a  ?Transportation:    N/a  ? ? ? ?Follow-up plan:   ? ? ?Pt to have FMLA paperwork sent to [place] for completion. ? ?Will continue to follow and assist as needed ? ?Jorge Ny, LCSW ?Clinical Social Worker ?Advanced Heart Failure Clinic ?Desk#: [phone] ?Cell#: [phone] ? ? ?

## 2021-05-17 NOTE — Patient Instructions (Addendum)
Thank you for coming in today ? ?Labs were done today, if any labs are abnormal the clinic will call you ?No news is good news ? ?Please check blood pressure and log readings ? ?Please take Metolazone 2.5 mg 1 tablet with 40 meq of Potassium with your Torsemide dose tonight ? ?STOP Spirolactone  ? ?START Eplerenone 50 mg 1 tablet daily  ? ?Your physician recommends that you schedule a follow-up appointment in:  ?2-3 weeks in clinic  ?3 months with Dr. Haroldine Laws ? ?At the [facility], you and your health needs are our priority. As part of our continuing mission to provide you with exceptional heart care, we have created designated Provider Care Teams. These Care Teams include your primary Cardiologist (physician) and Advanced Practice Providers (APPs- Physician Assistants and Nurse Practitioners) who all work together to provide you with the care you need, when you need it.  ? ?You may see any of the following providers on your designated Care Team at your next follow up: ?Dr Glori Bickers ?Dr Loralie Champagne ?Darrick Grinder, NP ?Lyda Jester, PA ?Jessica Milford,NP ?Marlyce Huge, PA ?Audry Riles, PharmD ? ? ?Please be sure to bring in all your medications bottles to every appointment.  ? ?If you have any questions or concerns before your next appointment please send [place] a message through [place] or call our office at [phone].   ? ?TO LEAVE A MESSAGE FOR THE NURSE SELECT OPTION 2, PLEASE LEAVE A MESSAGE INCLUDING: ?YOUR NAME ?DATE OF BIRTH ?CALL BACK NUMBER ?REASON FOR CALL**this is important as we prioritize the call backs ? ?YOU WILL RECEIVE A CALL BACK THE SAME DAY AS LONG AS YOU CALL BEFORE 4:00 PM ? ?

## 2021-05-17 NOTE — Progress Notes (Signed)
? ? ?ADVANCED HF CLINIC NOTE  ? ?PCP: Vevelyn Francois, NP ?Primary Cardiologist: Dr Harrell Gave  ?HF Cardiologist: Dr. Haroldine Laws ? ?HPI: ?[name] is a 56 y.o. with  morbid obesity, HTN, OSA, HFpEF, tobacco abuse, and DM2. Dad died from coronary disease at 81 and Mom has heart failure.  ? ?Had sleep study 10/2019 AHI 80.with desaturations to 68%. Had CPAP titration but has never received the machine. ? ?Last smoked 01/29/21. Previously smoking 1PPD.  ? ?Admitted 01/30/21 with increased dyspnea in the setting of COVID. Echo EF 35-40%  He was volume overloaded and diuresed with IV lasix.  CT negative for PE. Started on GDMT. Discharge weight 310 pounds. Seen in [place] and set up for cath.  ? ?R/L Cath 02/16/21 ?Normal cors. LVEF 45% ?RA = 12 ?RV = 44/14 ?PA = 40/24 (21) ?PCW = 21 ?Fick cardiac output/index = 8.0/3.3 ?PVR = 0.9 WU ?FA sat = 97% ?PA sat =  78%, 80% ?SVC sat = 79% ? ?Follow up 2/23, markedly volume up, weight 328 lbs. Entresto & spiro started, torsemide increased to 40 bid.  ? ?Today he returns for HF follow up. 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 Occasional dizziness and has ankle swelling. He is SOB walking on flat ground. Tried to cut grass (push mower), but could only get through half of yard. Now back to smoking about a pack a week, eating out at [employer] and [employer]. Denies CP, palpitations, or PND/Orthopnea. Appetite ok. No fever or chills. Weight at home 308-310 pounds. Taking all medications. Gets his CPAP tomorrow. Has breast tenderness, nipple sensitivity and swelling since hospitalization. ? ?Cardiac Testing  ?Echo 2019 55-60% Grade I DD Mild LVH. Trivial pericardial effusion.  ?Echo 01/2021 EF 35-40% Moderate pericardial effusion. Severe LVH.  ? ?Past Medical History:  ?Diagnosis Date  ? Asthma   ? CHF (congestive heart failure) ([place])   ? Hypertension   ? Sleep apnea   ? hasn't started CPAP yet  ? ? ?Current Outpatient Medications  ?Medication Sig  Dispense Refill  ? empagliflozin (JARDIANCE) 10 MG TABS tablet Take 1 tablet (10 mg total) by mouth daily. 30 tablet 11  ? metoprolol succinate (TOPROL-XL) 25 MG 24 hr tablet Take 1 tablet (25 mg total) by mouth daily. 30 tablet 11  ? potassium chloride SA (KLOR-CON M) 20 MEQ tablet Take 1 tablet (20 mEq total) by mouth 2 (two) times daily. 60 tablet 11  ? sacubitril-valsartan (ENTRESTO) 97-103 MG Take 1 tablet by mouth 2 (two) times daily. 60 tablet 11  ? spironolactone (ALDACTONE) 25 MG tablet Take 1 tablet (25 mg total) by mouth daily. 30 tablet 11  ? torsemide (DEMADEX) 20 MG tablet Take 2 tablets (40 mg total) by mouth 2 (two) times daily. 120 tablet 11  ? ?No current facility-administered medications for this encounter.  ? ?No Known Allergies ? ?Social History  ? ?Socioeconomic History  ? Marital status: Married  ?  Spouse name: Not on file  ? Number of children: 2  ? Years of education: 12th grade + 2 years college  ? Highest education level: Some college, no degree  ?Occupational History  ? Occupation: makes concrete blocks and bricks  ? Occupation: Freight forwarder  ?Tobacco Use  ? Smoking status: Former  ?  Packs/day: 0.50  ?  Years: 25.00  ?  Pack years: 12.50  ?  Types: Cigarettes  ?  Quit date: 02/25/2021  ?  Years since quitting: 0.2  ?  Smokeless tobacco: Never  ?Vaping Use  ? Vaping Use: Never used  ?Substance and Sexual Activity  ? Alcohol use: Not Currently  ?  Alcohol/week: 2.0 standard drinks  ?  Types: 2 Cans of beer per week  ?  Comment: occ beer  ? Drug use: No  ? Sexual activity: Not Currently  ?Other Topics Concern  ? Not on file  ?Social History Narrative  ? Lives with his wife and daughter.  ? Other child lives independently in [place].  ? Drinks about 3 sodas a day   ?   ? Pt stated he quit smoking  ? ?Social Determinants of Health  ? ?Financial Resource Strain: High Risk  ? Difficulty of Paying Living Expenses: Hard  ?Food Insecurity: No Food Insecurity  ? Worried About Sales executive in the Last Year: Never true  ? Ran Out of Food in the Last Year: Never true  ?Transportation Needs: No Transportation Needs  ? Lack of Transportation (Medical): No  ? Lack of Transportation (Non-Medical): No  ?Physical Activity: Not on file  ?Stress: Not on file  ?Social Connections: Not on file  ?Intimate Partner Violence: Not on file  ? ?Family History  ?Problem Relation Age of Onset  ? Heart disease Mother   ? Diabetes Mother   ? Asthma Mother   ? Heart disease Father   ? Asthma Sister   ? Heart disease Brother   ? Heart attack Brother   ? Colon cancer Neg Hx   ? Esophageal cancer Neg Hx   ? Rectal cancer Neg Hx   ? Stomach cancer Neg Hx   ? ?BP (!) 156/90   Pulse 94   Wt (!) 145 kg (319 lb 9.6 oz)   SpO2 93%   BMI 51.58 kg/m?  ? ?Wt Readings from Last 3 Encounters:  ?05/17/21 (!) 145 kg (319 lb 9.6 oz)  ?05/03/21 (!) 145.4 kg (320 lb 9.6 oz)  ?04/24/21 (!) 147.2 kg (324 lb 9.6 oz)  ? ?PHYSICAL EXAM: ?General:  NAD. No resp difficulty ?HEENT: Normal ?Neck: Supple. No JVD. Carotids 2+ bilat; no bruits. No lymphadenopathy or thryomegaly appreciated. ?Cor: PMI nondisplaced. Regular rate & rhythm. No rubs, gallops or murmurs. ?Lungs: Clear ?Abdomen: Obese, nontender, nondistended. No hepatosplenomegaly. No bruits or masses. Good bowel sounds. ?Extremities: No cyanosis, clubbing, rash, 2+ BLE edema ?Neuro: Alert & oriented x 3, cranial nerves grossly intact. Moves all 4 extremities w/o difficulty. Affect pleasant. ? ?ReDs: 43% ? ?ASSESSMENT & PLAN: ?1. Acute on chronic Systolic HF ?- Echo 1/65 79-03% LVH mod pericardial effusion. EF previously 55% in 2019.  ?- Cath 1/23; Normal cors. EF 45% (suspect HTN in nature). Mildly elevated pressures with high CO ?- NYHA III, he is volume overloaded on exam, ReDs 43%. ?- Give metolazone 2.5 mg x 1 + extra 40 KCL today with afternoon dose of torsemide. ?- Stop spiro (with breast tenderness/gynecomastia) and start eplerenone 50 mg daily. ?- Continue torsemide 40 mg  bid. ?- Continue Toprol 25 mg daily.  ?- Continue Entresto 97/103 mg bid ?- Continue Jardiance 10 mg daily  ?- Discussed need for dietary complaince. I asked him to refrain from eating out for the next 2 weeks. ?- I think he would be a good Furoscix candidate should he require it in the future. We discussed this today, will start PA. ?- BMET and BNP today, repeat BMET in 1 week. ? ?2. HTN, Uncontrolled.  ?- Elevated today. ?- Given Rx for BP cuff  and asked to check daily and log. ?- Diurese as above. ?- Add BiDil next. ? ?3. OSA, severe ?- Needs CPAP and weight loss. ? ?4. DM2 ?- Hgb A1C 6.1  ?- Continue Jardiance 10 mg daily.  ?- Needs follow up with PCP  ?- Consider GLP1RA. ? ?5. Obesity, morbid ?- Body mass index is 51.58 kg/m?. ?- Needs weight loss ?- Consider GLP1RA ? ?6. Tobacco Abuse ?- Former heavy smoker.  ?- Quit 01/29/21, but now back to smoking 1 pp week. ?- Discussed cessation. ? ?7. SDOH ?- He is worried he is going to get fired from job due to frequent urination. I gave him a work note. ?- HFSW to discuss STD/FMLA ? ?Follow up with APP in 2 weeks (ReDs) and 3 months with Dr. Haroldine Laws. ? ?Rafael Bihari, FNP  ?11:24 AM ? ?

## 2021-05-17 NOTE — Progress Notes (Signed)
ReDS Vest / Clip - 05/17/21 1100   ? ?  ? ReDS Vest / Clip  ? Station Marker D   ? Ruler Value 38.5   ? ReDS Value Range High volume overload   ? ReDS Actual Value 43   ? ?  ?  ? ?  ? ? ?

## 2021-05-22 ENCOUNTER — Telehealth (HOSPITAL_COMMUNITY): Payer: Self-pay

## 2021-05-22 DIAGNOSIS — I428 Other cardiomyopathies: Secondary | ICD-10-CM

## 2021-05-22 DIAGNOSIS — I5042 Chronic combined systolic (congestive) and diastolic (congestive) heart failure: Secondary | ICD-10-CM

## 2021-05-22 MED ORDER — POTASSIUM CHLORIDE CRYS ER 20 MEQ PO TBCR: 40.0000 meq | EXTENDED_RELEASE_TABLET | Freq: Two times a day (BID) | ORAL | 2 refills | Status: DC

## 2021-05-22 NOTE — Telephone Encounter (Signed)
Patient advised and verbalized understanding. Med list updated to reflect changes, lab appt scheduled, lab order entered.  ?Meds ordered this encounter  ?Medications  ? potassium chloride SA (KLOR-CON M) 20 MEQ tablet  ?  Sig: Take 2 tablets (40 mEq total) by mouth 2 (two) times daily.  ?  Dispense:  120 tablet  ?  Refill:  2  ?  Please cancel all previous orders for current medication. Change in dosage or pill size.  ? ? ?

## 2021-05-22 NOTE — Telephone Encounter (Signed)
-----   Message from Rafael Bihari, [place] sent at 05/17/2021  2:05 PM EDT ----- ?K is low. Please increase KCL to 40 bid. Repeat BMET in 1 week ?

## 2021-05-24 ENCOUNTER — Other Ambulatory Visit (HOSPITAL_COMMUNITY): Payer: 59

## 2021-05-29 ENCOUNTER — Other Ambulatory Visit (HOSPITAL_COMMUNITY): Payer: 59

## 2021-06-04 ENCOUNTER — Ambulatory Visit: Payer: Self-pay | Admitting: Nurse Practitioner

## 2021-06-06 ENCOUNTER — Telehealth (HOSPITAL_COMMUNITY): Payer: Self-pay

## 2021-06-06 NOTE — Telephone Encounter (Signed)
Called to confirm/remind patient of their appointment at [facility] on 06/07/21.  ? ?Patient reminded to bring all medications and/or complete list. ? ?Confirmed patient has transportation. Gave directions, instructed to utilize valet parking. ? ?Confirmed appointment prior to ending call.  ? ?

## 2021-06-07 ENCOUNTER — Ambulatory Visit (HOSPITAL_COMMUNITY)
Admission: RE | Admit: 2021-06-07 | Discharge: 2021-06-07 | Disposition: A | Discharge status: Home / Self Care | Payer: 59 | Source: Ambulatory Visit | Attending: Cardiology | Admitting: Cardiology

## 2021-06-07 VITALS — BP 140/88 | HR 96 | Wt 318.8 lb

## 2021-06-07 DIAGNOSIS — Z7984 Long term (current) use of oral hypoglycemic drugs: Secondary | ICD-10-CM | POA: Insufficient documentation

## 2021-06-07 DIAGNOSIS — E119 Type 2 diabetes mellitus without complications: Secondary | ICD-10-CM | POA: Diagnosis not present

## 2021-06-07 DIAGNOSIS — I5042 Chronic combined systolic (congestive) and diastolic (congestive) heart failure: Secondary | ICD-10-CM | POA: Diagnosis not present

## 2021-06-07 DIAGNOSIS — G4733 Obstructive sleep apnea (adult) (pediatric): Secondary | ICD-10-CM | POA: Insufficient documentation

## 2021-06-07 DIAGNOSIS — I110 Hypertensive heart disease with heart failure: Secondary | ICD-10-CM | POA: Diagnosis not present

## 2021-06-07 DIAGNOSIS — Z87891 Personal history of nicotine dependence: Secondary | ICD-10-CM | POA: Insufficient documentation

## 2021-06-07 DIAGNOSIS — Z8616 Personal history of COVID-19: Secondary | ICD-10-CM | POA: Diagnosis not present

## 2021-06-07 DIAGNOSIS — I3139 Other pericardial effusion (noninflammatory): Secondary | ICD-10-CM | POA: Insufficient documentation

## 2021-06-07 DIAGNOSIS — Z9989 Dependence on other enabling machines and devices: Secondary | ICD-10-CM | POA: Diagnosis not present

## 2021-06-07 DIAGNOSIS — Z79899 Other long term (current) drug therapy: Secondary | ICD-10-CM | POA: Insufficient documentation

## 2021-06-07 DIAGNOSIS — Z6841 Body Mass Index (BMI) 40.0 and over, adult: Secondary | ICD-10-CM | POA: Insufficient documentation

## 2021-06-07 LAB — BASIC METABOLIC PANEL
Anion gap: 8 (ref 5–15)
BUN: 15 mg/dL (ref 6–20)
CO2: 33 mmol/L — ABNORMAL HIGH (ref 22–32)
Calcium: 8.2 mg/dL — ABNORMAL LOW (ref 8.9–10.3)
Chloride: 99 mmol/L (ref 98–111)
Creatinine, Ser: 1.22 mg/dL (ref 0.61–1.24)
GFR, Estimated: >60 mL/min (ref >60)
Glucose, Bld: 161 mg/dL — ABNORMAL HIGH (ref 70–99)
Potassium: 3 mmol/L — ABNORMAL LOW (ref 3.5–5.1)
Sodium: 140 mmol/L (ref 135–145)

## 2021-06-07 NOTE — Addendum Note (Signed)
Encounter addended by: Consuelo Pandy, PA-C on: 06/07/2021 3:27 PM ? Actions taken: Level of Service modified

## 2021-06-07 NOTE — Patient Instructions (Addendum)
Thank you for coming in today ? ?Labs were done today, if any labs are abnormal the clinic will call you ?No news is good news ? ?Your physician recommends that you schedule a follow-up appointment in:  ?Keep follow up appointment with Dr. Haroldine Laws ? ?At the [facility], you and your health needs are our priority. As part of our continuing mission to provide you with exceptional heart care, we have created designated Provider Care Teams. These Care Teams include your primary Cardiologist (physician) and Advanced Practice Providers (APPs- Physician Assistants and Nurse Practitioners) who all work together to provide you with the care you need, when you need it.  ? ?You may see any of the following providers on your designated Care Team at your next follow up: ?Dr Glori Bickers ?Dr Loralie Champagne ?Darrick Grinder, NP ?Lyda Jester, PA ?Jessica Milford,NP ?Marlyce Huge, PA ?Audry Riles, PharmD ? ? ?Please be sure to bring in all your medications bottles to every appointment.  ? ?If you have any questions or concerns before your next appointment please send [place] a message through [place] or call our office at [phone].   ? ?TO LEAVE A MESSAGE FOR THE NURSE SELECT OPTION 2, PLEASE LEAVE A MESSAGE INCLUDING: ?YOUR NAME ?DATE OF BIRTH ?CALL BACK NUMBER ?REASON FOR CALL**this is important as we prioritize the call backs ? ?YOU WILL RECEIVE A CALL BACK THE SAME DAY AS LONG AS YOU CALL BEFORE 4:00 PM ? ?

## 2021-06-07 NOTE — Progress Notes (Signed)
? ? ? ? ?ADVANCED HF CLINIC NOTE  ? ?PCP: Vevelyn Francois, NP ?Primary Cardiologist: Dr Harrell Gave  ?HF Cardiologist: Dr. Haroldine Laws ? ?HPI: ?[name] is a 56 y.o. with  morbid obesity, HTN, OSA, HFpEF, tobacco abuse, and DM2. Dad died from coronary disease at 33 and Mom has heart failure.  ? ?Had sleep study 10/2019 AHI 80.with desaturations to 68%. Had CPAP titration but has never received the machine. ? ?Last smoked 01/29/21. Previously smoking 1PPD.  ? ?Admitted 01/30/21 with increased dyspnea in the setting of COVID. Echo EF 35-40%  He was volume overloaded and diuresed with IV lasix.  CT negative for PE. Started on GDMT. Discharge weight 310 pounds. Seen in [facility] and set up for cath.  ? ?R/L Cath 02/16/21 ?Normal cors. LVEF 45% ?RA = 12 ?RV = 44/14 ?PA = 40/24 (21) ?PCW = 21 ?Fick cardiac output/index = 8.0/3.3 ?PVR = 0.9 WU ?FA sat = 97% ?PA sat =  78%, 80% ?SVC sat = 79% ? ?Follow up 2/23, markedly volume up, weight 328 lbs. Entresto & spiro started, torsemide increased to 40 bid.  ? ?Seen back for f/u 4/23 and was still volume up in the setting of dietary indiscretion ReDs 43%. NYHA Class II. Ordered to take dose of metolazone x 1 w/ next dose of torsemide. Arlyce Harman was discontinued due to breast tenderness. Switched to eplerenone.  ? ?Returns back today for f/u. Reports improved symptoms. Less dyspnea w/ ADLs, now NYHA Class II. LEE much improved, per his report. His wt on home scale was down to 310 lb today (down 10 lb since last visit ). BP mildly elevated but has not yet taken AM meds. Now has CPAP device and reports full compliance.  ? ?Cardiac Testing  ?Echo 2019 55-60% Grade I DD Mild LVH. Trivial pericardial effusion.  ?Echo 01/2021 EF 35-40% Moderate pericardial effusion. Severe LVH.  ? ?Past Medical History:  ?Diagnosis Date  ? Asthma   ? CHF (congestive heart failure) ([place])   ? Hypertension   ? Sleep apnea   ? hasn't started CPAP yet  ? ? ?Current Outpatient Medications  ?Medication Sig Dispense Refill   ? empagliflozin (JARDIANCE) 10 MG TABS tablet Take 1 tablet (10 mg total) by mouth daily. 30 tablet 11  ? eplerenone (INSPRA) 50 MG tablet Take 1 tablet (50 mg total) by mouth daily. 30 tablet 6  ? metolazone (ZAROXOLYN) 2.5 MG tablet Take 1 tablet (2.5 mg total) by mouth daily. 1 tablet 0  ? metoprolol succinate (TOPROL-XL) 25 MG 24 hr tablet Take 1 tablet (25 mg total) by mouth daily. 30 tablet 11  ? potassium chloride SA (KLOR-CON M) 20 MEQ tablet Take 2 tablets (40 mEq total) by mouth 2 (two) times daily. 120 tablet 2  ? sacubitril-valsartan (ENTRESTO) 97-103 MG Take 1 tablet by mouth 2 (two) times daily. 60 tablet 11  ? torsemide (DEMADEX) 20 MG tablet Take 2 tablets (40 mg total) by mouth 2 (two) times daily. 120 tablet 11  ? ?No current facility-administered medications for this encounter.  ? ?No Known Allergies ? ?Social History  ? ?Socioeconomic History  ? Marital status: Married  ?  Spouse name: Not on file  ? Number of children: 2  ? Years of education: 12th grade + 2 years college  ? Highest education level: Some college, no degree  ?Occupational History  ? Occupation: makes concrete blocks and bricks  ? Occupation: Freight forwarder  ?Tobacco Use  ? Smoking status: Former  ?  Packs/day: 0.50  ?  Years: 25.00  ?  Pack years: 12.50  ?  Types: Cigarettes  ?  Quit date: 02/25/2021  ?  Years since quitting: 0.2  ? Smokeless tobacco: Never  ?Vaping Use  ? Vaping Use: Never used  ?Substance and Sexual Activity  ? Alcohol use: Not Currently  ?  Alcohol/week: 2.0 standard drinks  ?  Types: 2 Cans of beer per week  ?  Comment: occ beer  ? Drug use: No  ? Sexual activity: Not Currently  ?Other Topics Concern  ? Not on file  ?Social History Narrative  ? Lives with his wife and daughter.  ? Other child lives independently in [place].  ? Drinks about 3 sodas a day   ?   ? Pt stated he quit smoking  ? ?Social Determinants of Health  ? ?Financial Resource Strain: High Risk  ? Difficulty of Paying Living Expenses: Hard   ?Food Insecurity: No Food Insecurity  ? Worried About Charity fundraiser in the Last Year: Never true  ? Ran Out of Food in the Last Year: Never true  ?Transportation Needs: No Transportation Needs  ? Lack of Transportation (Medical): No  ? Lack of Transportation (Non-Medical): No  ?Physical Activity: Not on file  ?Stress: Not on file  ?Social Connections: Not on file  ?Intimate Partner Violence: Not on file  ? ?Family History  ?Problem Relation Age of Onset  ? Heart disease Mother   ? Diabetes Mother   ? Asthma Mother   ? Heart disease Father   ? Asthma Sister   ? Heart disease Brother   ? Heart attack Brother   ? Colon cancer Neg Hx   ? Esophageal cancer Neg Hx   ? Rectal cancer Neg Hx   ? Stomach cancer Neg Hx   ? ?BP 140/88   Pulse 96   Wt (!) 144.6 kg (318 lb 12.8 oz)   SpO2 97%   BMI 51.46 kg/m?  ? ?Wt Readings from Last 3 Encounters:  ?06/07/21 (!) 144.6 kg (318 lb 12.8 oz)  ?05/17/21 (!) 145 kg (319 lb 9.6 oz)  ?05/03/21 (!) 145.4 kg (320 lb 9.6 oz)  ? ?PHYSICAL EXAM: ?General:  Well appearing, obese. No respiratory difficulty ?HEENT: normal ?Neck: supple. no JVD. Carotids 2+ bilat; no bruits. No lymphadenopathy or thyromegaly appreciated. ?Cor: PMI nondisplaced. Regular rate & rhythm. No rubs, gallops or murmurs. ?Lungs: clear ?Abdomen: soft, nontender, nondistended. No hepatosplenomegaly. No bruits or masses. Good bowel sounds. ?Extremities: no cyanosis, clubbing, rash, trace ankle edema R>L  ?Neuro: alert & oriented x 3, cranial nerves grossly intact. moves all 4 extremities w/o difficulty. Affect pleasant. ? ? ? ?ASSESSMENT & PLAN: ?1. Acute on chronic Systolic HF ?- Echo 7/86 76-72% LVH mod pericardial effusion. EF previously 55% in 2019.  ?- Cath 1/23; Normal cors. EF 45% (suspect HTN in nature). Mildly elevated pressures with high CO ?- NYHA Class II, euvolemic on exam  ?- Continue eplerenone 50 mg daily (breast tenderness w/spiro). ?- Continue torsemide 40 mg bid. ?- Continue Toprol 25 mg  daily.  ?- Continue Entresto 97/103 mg bid ?- Continue Jardiance 10 mg daily  ?- discussed importance of strict med compliance, daily wts, low sodium diet and fluid restriction  ?- check BMP today  ? ?2. Hypertension  ?- Controlled on current regimen ?- BMP today  ?- continue CPAP  ? ?3. OSA, severe ?- now has CPAP device, reports full compliance  ? ?4. DM2 ?-  Hgb A1C 6.1  ?- Continue Jardiance 10 mg daily.  ?- followed by PCP  ?- Consider GLP1RA. ? ?5. Obesity, morbid ?- Body mass index is 51.46 kg/m?. ?- Needs weight loss ?- Consider GLP1RA ? ?6. Tobacco Abuse ?- Former heavy smoker.  ?- Quit 01/29/21, but now back to smoking 1 pp week. ?- encouraged cessation. ? ?Keep f/u in 2 months  ? ?Lyda Jester, PA-C  ?3:17 PM ? ? ? ? ? ?

## 2021-06-08 ENCOUNTER — Telehealth (HOSPITAL_COMMUNITY): Payer: Self-pay

## 2021-06-08 DIAGNOSIS — I428 Other cardiomyopathies: Secondary | ICD-10-CM

## 2021-06-08 DIAGNOSIS — I5042 Chronic combined systolic (congestive) and diastolic (congestive) heart failure: Secondary | ICD-10-CM

## 2021-06-08 MED ORDER — POTASSIUM CHLORIDE CRYS ER 20 MEQ PO TBCR: 60.0000 meq | EXTENDED_RELEASE_TABLET | Freq: Two times a day (BID) | ORAL | 5 refills | Status: DC

## 2021-06-08 NOTE — Telephone Encounter (Signed)
Patient advised and verbalized understanding. Lab appt scheduled. Lab order entered, med list updated to reflect changes.  ? ?Meds ordered this encounter  ?Medications  ? potassium chloride SA (KLOR-CON M) 20 MEQ tablet  ?  Sig: Take 3 tablets (60 mEq total) by mouth 2 (two) times daily.  ?  Dispense:  180 tablet  ?  Refill:  5  ?  Please cancel all previous orders for current medication. Change in dosage or pill size.  ? ?Orders Placed This Encounter  ?Procedures  ? Basic metabolic panel  ?  Standing Status:   Future  ?  Standing Expiration Date:   06/09/2022  ?  Order Specific Question:   Release to patient  ?  Answer:   Immediate  ? ? ?

## 2021-06-08 NOTE — Telephone Encounter (Signed)
-----   Message from Consuelo Pandy, [place] sent at 06/07/2021  3:29 PM EDT ----- ?K low. Increase KCl to 60 mEQ bid. Repeat BMP in 7 days  ?

## 2021-06-15 ENCOUNTER — Other Ambulatory Visit (HOSPITAL_COMMUNITY): Payer: Self-pay | Admitting: Family Medicine

## 2021-06-15 ENCOUNTER — Ambulatory Visit (HOSPITAL_COMMUNITY)
Admission: RE | Admit: 2021-06-15 | Discharge: 2021-06-15 | Disposition: A | Discharge status: Home / Self Care | Payer: 59 | Source: Ambulatory Visit | Attending: Internal Medicine | Admitting: Internal Medicine

## 2021-06-15 DIAGNOSIS — I5042 Chronic combined systolic (congestive) and diastolic (congestive) heart failure: Secondary | ICD-10-CM | POA: Diagnosis present

## 2021-06-15 LAB — BASIC METABOLIC PANEL
Anion gap: 3 — ABNORMAL LOW (ref 5–15)
BUN: 11 mg/dL (ref 6–20)
CO2: 32 mmol/L (ref 22–32)
Calcium: 9 mg/dL (ref 8.9–10.3)
Chloride: 105 mmol/L (ref 98–111)
Creatinine, Ser: 1.09 mg/dL (ref 0.61–1.24)
GFR, Estimated: >60 mL/min (ref >60)
Glucose, Bld: 165 mg/dL — ABNORMAL HIGH (ref 70–99)
Potassium: 4 mmol/L (ref 3.5–5.1)
Sodium: 140 mmol/L (ref 135–145)

## 2021-06-22 ENCOUNTER — Telehealth (HOSPITAL_COMMUNITY): Payer: Self-pay

## 2021-06-22 ENCOUNTER — Encounter (HOSPITAL_COMMUNITY): Payer: Self-pay

## 2021-06-22 NOTE — Telephone Encounter (Signed)
Form faxed on 06/22/2021. My chart message also sent to patient.

## 2021-07-12 ENCOUNTER — Telehealth (HOSPITAL_COMMUNITY): Payer: Self-pay | Admitting: *Deleted

## 2021-07-12 NOTE — Telephone Encounter (Signed)
Furoscix ordered at [facility] 05/17/21 for prn use:  - I think he would be a good Furoscix candidate should he require it in the future. We discussed this today, will start PA.   Furoscix is not on pt's formulary ([employer]) so unable to get

## 2021-08-14 ENCOUNTER — Encounter: Payer: Self-pay | Admitting: Cardiovascular Disease

## 2021-08-14 ENCOUNTER — Ambulatory Visit (INDEPENDENT_AMBULATORY_CARE_PROVIDER_SITE_OTHER): Payer: 59 | Admitting: Cardiovascular Disease

## 2021-08-14 VITALS — BP 150/90 | HR 88 | Ht 66.5 in | Wt 320.6 lb

## 2021-08-14 DIAGNOSIS — I10 Essential (primary) hypertension: Secondary | ICD-10-CM | POA: Diagnosis not present

## 2021-08-14 DIAGNOSIS — G4733 Obstructive sleep apnea (adult) (pediatric): Secondary | ICD-10-CM

## 2021-08-14 DIAGNOSIS — E119 Type 2 diabetes mellitus without complications: Secondary | ICD-10-CM

## 2021-08-14 DIAGNOSIS — I5042 Chronic combined systolic (congestive) and diastolic (congestive) heart failure: Secondary | ICD-10-CM

## 2021-08-14 DIAGNOSIS — I428 Other cardiomyopathies: Secondary | ICD-10-CM

## 2021-08-14 DIAGNOSIS — I110 Hypertensive heart disease with heart failure: Secondary | ICD-10-CM

## 2021-08-14 DIAGNOSIS — Z720 Tobacco use: Secondary | ICD-10-CM

## 2021-08-14 NOTE — Progress Notes (Signed)
Cardiology Office Note    Date:  08/16/2021   ID:  Randy Morgan, DOB [birth year], MRN [id number]  PCP:  Randy Francois, NP  Cardiologist:  Randy Majestic, MD (sleep); Dr. Haroldine Morgan; Dr. Harrell Morgan  New sleep consult   History of Present Illness:  Randy Morgan is a 56 y.o. male who is followed by Dr. Al Pimple for primary cardiology care and Dr. Haroldine Morgan for advanced heart failure.  Mr. Randy Morgan has a history of morbid obesity, hypertension, diabetes mellitus, obstructive sleep apnea, tobacco abuse, and in January 2023 was admitted with increased dyspnea in the setting of COVID.  An echo Doppler study at that time showed reduced EF at 35 to 40% compared to a prior echo of 2019 where EF was 55 to 60%.  He also had a pericardial effusion and severe left ventricular hypertrophy.  He had undergone cardiac catheterization which showed normal coronary arteries with mild elevation of right heart pressures.  PVR was 0.9 WU.  He has been treated for volume overload and is now followed closely by Dr. Haroldine Morgan and is on guideline directed medical therapy with metoprolol succinate, Entresto titrated up to 97/103 mg twice a day, eplerenone 50 mg daily, in addition to Jardiance 10 mg and torsemide 20 mg daily.  A cardiac MRI showed EF 29% with diffuse hypokinesis, moderate dilatation of RV with RV EF 34%, and there was no evidence for late gadolinium enhancement.  The patient has a previous history of previously documented sleep apnea and had undergone a prior diagnostic polysomnogram on August 27, 2019 which showed severe sleep apnea with an AHI of 80.6 and O2 nadir at 68%.  A titration study done on November 19, 2019 showed optimal CPAP pressure at approximately 15 cm and he required supplemental oxygen at 2 L/min.  Apparently, the patient never really followed up with CPAP  and was not on treatment.  Due to concerns for continued significant obstructive sleep apnea with morbid obesity,  significant snoring history, fatigability, and episodes of waking up gasping for breath, he was referred for a home sleep study which was done on April 09, 2021.  This reconfirmed severe sleep apnea with an AHI of 86.1/h.  There was a significant positional component with supine sleep AHI at 92/h versus nonsupine sleep AHI at 38.7/h.  Oxygen desaturated to a nadir of 79% and time spent less than 89% was 50.1 minutes.  He apparently did not undergo another titration study and AutoPap was initiated after he received a ResMed air sense 11 AutoSet CPAP unit on May 18, 2021.  His initial pressures were set at a minimum of 14 with maximum of 20 cm.  Typically, he goes to bed at 10 PM and often wakes up at 4 AM.  He had been experiencing nocturia at least 3-4 times per night.  Several downloads were obtained in the office today with an initial download of through Jun 14, 2021.  He was compliant with usage but not compliant with usage greater than 4 hours at only 3 hours and 25 minutes.  His 95th percentile pressure was 18.8 with maximum average pressure 19.2 and AHI was 3.1.  A subsequent download from May 18 through July 15, 2021 was similar with average use suboptimal at 3 hours and 20 minutes.  AHI was 3.2.  A download from initiation to August 13, 2021 was again consistent with usage days 84% and usage greater than 4 hours only 28%.  AHI was 2.9 and 95th percentile pressure was  17.9 with maximum average pressure of 18.4.  He has felt improved when he uses CPAP therapy.  Unfortunately, he often takes his mask off after coming back from the bathroom.  He continues to experience nocturia at least 3 times per night.  He is unaware of breakthrough snoring when using CPAP.  He continues to have residual daytime sleepiness and an Epworth Sleepiness Scale score was calculated in the office today which endorsed at 14 as shown below:  Epworth Sleepiness Scale: Situation   Chance of Dozing/Sleeping (0 = never , 1 = slight  chance , 2 = moderate chance , 3 = high chance )   sitting and reading 0   watching TV 2   sitting inactive in a public place 0   being a passenger in a motor vehicle for an hour or more 3   lying down in the afternoon 3   sitting and talking to someone 2   sitting quietly after lunch (no alcohol) 3   while stopped for a few minutes in traffic as the driver 1   Total Score  14    He is unaware of any bruxism.  He denies significant restless legs.  He is unaware of hip no Popik or hypnagogic hallucinations or cataplectic events.  He presents for initial sleep consultation and further evaluation.   Past Medical History:  Diagnosis Date   Asthma    CHF (congestive heart failure) (HCC)    Hypertension    Sleep apnea    hasn't started CPAP yet    Past Surgical History:  Procedure Laterality Date   NO PAST SURGERIES     RIGHT/LEFT HEART CATH AND CORONARY ANGIOGRAPHY N/A 02/16/2021   Procedure: RIGHT/LEFT HEART CATH AND CORONARY ANGIOGRAPHY;  Surgeon: Jolaine Artist, MD;  Location: [facility];  Service: Cardiovascular;  Laterality: N/A;   VIDEO BRONCHOSCOPY Bilateral 12/09/2017   Procedure: VIDEO BRONCHOSCOPY WITHOUT FLUORO;  Surgeon: Laurin Coder, MD;  Location: [facility];  Service: Cardiopulmonary;  Laterality: Bilateral;    Current Medications: Outpatient Medications Prior to Visit  Medication Sig Dispense Refill   empagliflozin (JARDIANCE) 10 MG TABS tablet Take 1 tablet (10 mg total) by mouth daily. 30 tablet 11   eplerenone (INSPRA) 50 MG tablet Take 1 tablet (50 mg total) by mouth daily. 30 tablet 6   metolazone (ZAROXOLYN) 2.5 MG tablet Take 1 tablet (2.5 mg total) by mouth daily. 1 tablet 0   metoprolol succinate (TOPROL-XL) 25 MG 24 hr tablet Take 1 tablet (25 mg total) by mouth daily. 30 tablet 11   potassium chloride SA (KLOR-CON M) 20 MEQ tablet Take 3 tablets (60 mEq total) by mouth 2 (two) times daily. 180 tablet 5   sacubitril-valsartan (ENTRESTO)  97-103 MG Take 1 tablet by mouth 2 (two) times daily. 60 tablet 11   torsemide (DEMADEX) 20 MG tablet Take 2 tablets (40 mg total) by mouth 2 (two) times daily. 120 tablet 11   No facility-administered medications prior to visit.     Allergies:   Patient has no known allergies.   Social History   Socioeconomic History   Marital status: Married    Spouse name: Not on file   Number of children: 2   Years of education: 12th grade + 2 years college   Highest education level: Some college, no degree  Occupational History   Occupation: makes concrete blocks and bricks   Occupation: Freight forwarder  Tobacco Use   Smoking status: Former  Packs/day: 0.50    Years: 25.00    Total pack years: 12.50    Types: Cigarettes    Quit date: 02/25/2021    Years since quitting: 0.4   Smokeless tobacco: Never  Vaping Use   Vaping Use: Never used  Substance and Sexual Activity   Alcohol use: Not Currently    Alcohol/week: 2.0 standard drinks of alcohol    Types: 2 Cans of beer per week    Comment: occ beer   Drug use: No   Sexual activity: Not Currently  Other Topics Concern   Not on file  Social History Narrative   Lives with his wife and daughter.   Other child lives independently in [place].   Drinks about 3 sodas a day       Pt stated he quit smoking   Social Determinants of Health   Financial Resource Strain: High Risk (02/01/2021)   Overall Financial Resource Strain (CARDIA)    Difficulty of Paying Living Expenses: Hard  Food Insecurity: No Food Insecurity (02/01/2021)   Hunger Vital Sign    Worried About Running Out of Food in the Last Year: Never true    Ran Out of Food in the Last Year: Never true  Transportation Needs: No Transportation Needs (02/01/2021)   PRAPARE - Hydrologist (Medical): No    Lack of Transportation (Non-Medical): No  Physical Activity: Not on file  Stress: Not on file  Social Connections: Not on file    Socially, he was  born in [place].  He lived in [place] and played high school football for [employer] in [place] as well as [employer].  He is married for 25 years and has 4 children.  He works as a Freight forwarder.  He quit smoking in January 2023.  Family History:  The patient's family history includes Asthma in his mother and sister; Diabetes in his mother; Heart attack in his brother; Heart disease in his brother, father, and mother.  His father is deceased and died at 19.  Mother is living at 27.  He has 1 brother who died of a myocardial infarction.  1 sister age 37 has asthma.  Sleep apnea he is present in family members.  ROS General: Negative; No fevers, chills, or night sweats;  HEENT: Negative; No changes in vision or hearing, sinus congestion, difficulty swallowing Pulmonary: Negative; No cough, wheezing, shortness of breath, hemoptysis Cardiovascular: See HPI; no current chest pain.  Lower extremity edema. GI: Negative; No nausea, vomiting, diarrhea, or abdominal pain GU: Negative; No dysuria, hematuria, or difficulty voiding Musculoskeletal: Negative; no myalgias, joint pain, or weakness Hematologic/Oncology: Negative; no easy bruising, bleeding Endocrine: Negative; no heat/cold intolerance; no diabetes Neuro: Negative; no changes in balance, headaches Skin: Negative; No rashes or skin lesions Psychiatric: Negative; No behavioral problems, depression Sleep: See HPI Other comprehensive 14 point system review is negative.   PHYSICAL EXAM:   VS:  BP (!) 150/90   Pulse 88   Ht 5' 6.5" (1.689 m)   Wt (!) 320 lb 9.6 oz (145.4 kg)   SpO2 97%   BMI 50.97 kg/m     Repeat blood pressure by me was 146/90  Wt Readings from Last 3 Encounters:  08/14/21 (!) 320 lb 9.6 oz (145.4 kg)  06/07/21 (!) 318 lb 12.8 oz (144.6 kg)  05/17/21 (!) 319 lb 9.6 oz (145 kg)    General: Alert, oriented, no distress.  Morbid obesity  Skin: normal turgor, no rashes, warm  and dry HEENT: Normocephalic, atraumatic. Pupils equal round and reactive to light; sclera anicteric; extraocular muscles intact;  Nose without nasal septal hypertrophy Mouth/Parynx benign; large tongue; Mallinpatti scale 4 Neck: No JVD, no carotid bruits; normal carotid upstroke Lungs: clear to ausculatation and percussion; no wheezing or rales Chest wall: without tenderness to palpitation Heart: PMI not displaced, RRR, s1 s2 normal, 1/6 systolic murmur, no diastolic murmur, no rubs, gallops, thrills, or heaves Abdomen: soft, nontender; no hepatosplenomehaly, BS+; abdominal aorta nontender and not dilated by palpation. Back: no CVA tenderness Pulses 2+ Musculoskeletal: full range of motion, normal strength, no joint deformities Extremities: bilateral 1-2+ lower extremity edema,  no clubbing cyanosis or edema, Homan's sign negative  Neurologic: grossly nonfocal; Cranial nerves grossly wnl Psychologic: Normal mood and affect   Studies/Labs Reviewed:   August 14, 2021 ECG (independently read by me): NSR at 88, QTc 496 msec, no ectopy  Recent Labs:    Latest Ref Rng & Units 06/15/2021    8:28 AM 06/07/2021   12:35 PM 05/17/2021   12:07 PM  BMP  Glucose 70 - 99 mg/dL 165  161  126   BUN 6 - 20 mg/dL _0 Creatinine 0.61 - 1.24 mg/dL 1.09  1.22  1.17   Sodium 135 - 145 mmol/L 140  140  141   Potassium 3.5 - 5.1 mmol/L 4.0  3.0  3.2   Chloride 98 - 111 mmol/L 105  99  100   CO2 22 - 32 mmol/L 32  33  32   Calcium 8.9 - 10.3 mg/dL 9.0  8.2  8.5         Latest Ref Rng & Units 03/07/2021    3:16 PM 02/01/2021   12:45 AM 03/02/2020   10:34 AM  Hepatic Function  Total Protein 6.0 - 8.5 g/dL 7.3  6.8  7.0   Albumin 3.8 - 4.9 g/dL 4.2  3.3  4.2   AST 0 - 40 IU/L _1 ALT 0 - 44 U/L  23    Alk Phosphatase 44 - 121 IU/L 69  49  63   Total Bilirubin 0.0 - 1.2 mg/dL 0.9  0.5  0.7   Bilirubin, Direct 0.0 - 0.2 mg/dL  0.2         Latest Ref Rng & Units 02/16/2021    9:59 AM  02/16/2021    9:52 AM 02/16/2021    9:49 AM  CBC  Hemoglobin 13.0 - 17.0 g/dL 11.9  13.9    14.6  14.6   Hematocrit 39.0 - 52.0 % 35.0  41.0    43.0  43.0    Lab Results  Component Value Date   MCV 89.7 02/09/2021   MCV 89.8 02/01/2021   MCV 87.5 01/30/2021   Lab Results  Component Value Date   TSH 0.502 02/01/2021   Lab Results  Component Value Date   HGBA1C 6.1 (H) 02/01/2021     BNP    Component Value Date/Time   BNP 40.2 05/17/2021 1207    ProBNP No results found for: "PROBNP"   Lipid Panel     Component Value Date/Time   CHOL 166 02/03/2021 0327   CHOL 220 (H) 03/02/2020 1034   TRIG 160 (H) 02/03/2021 0327   HDL 36 (L) 02/03/2021 0327   HDL 36 (L) 03/02/2020 1034   CHOLHDL 4.6 02/03/2021 0327   VLDL 32 02/03/2021 0327  [place] 98 02/03/2021 0327   LDLCALC 154 (H) 03/02/2020 1034   LABVLDL 30 03/02/2020 1034     RADIOLOGY: No results found.   Additional studies/ records that were reviewed today include:     Patient Name: Bostyn, Bogie Date: 04/09/2021 Gender: Male D.O.B: Jun 08, 1965 Age (years): 60 Referring Provider: Dionisio David NP Height (inches): 66 Interpreting Physician: Randy Majestic MD, ABSM Weight (lbs): 310 RPSGT: Jacolyn Reedy BMI: 50 MRN: [id number] Neck Size: 19.00   CLINICAL INFORMATION Sleep Study Type: HST   Indication for sleep study: Weight Gain, Witnessed Apneas, Witnesses Apnea / Gasping During Sleep   Epworth Sleepiness Score: 13   Most recent polysomnogram 08/27/2019: AHI 80.6/h, O2 nadir 68%. Most recent titration study dated 11/19/2019 was optimal at 15 cm H2O with supplemental O2 at 2l/min recommendation. NSVT was noted during the study.   SLEEP STUDY TECHNIQUE A multi-channel overnight portable sleep study was performed. The channels recorded were: nasal airflow, thoracic respiratory movement, and oxygen saturation with a pulse oximetry. Snoring was also monitored.   MEDICATIONS empagliflozin  (JARDIANCE) 10 MG TABS tablet metoprolol succinate (TOPROL-XL) 25 MG 24 hr tablet potassium chloride SA (KLOR-CON M) 20 MEQ tablet sacubitril-valsartan (ENTRESTO) 97-103 MG spironolactone (ALDACTONE) 25 MG tablet torsemide (DEMADEX) 20 MG tablet   Patient self administered medications include: N/A.   SLEEP ARCHITECTURE Patient was studied for 368.5 minutes. The sleep efficiency was 100.0 % and the patient was supine for 91.5%. The arousal index was 0.0 per hour.   RESPIRATORY PARAMETERS The overall AHI was 86.1 per hour, with a central apnea index of 0 per hour.   The oxygen nadir was 79% during sleep.   CARDIAC DATA Mean heart rate during sleep was 80.6 bpm.   IMPRESSIONS - Severe obstructive sleep apnea occurred during this study (AHI 86.1/h). There is a positional component with supine sleep AHI 92/h versus non-supine AHI 38.7/h. - Severe oxygen desaturation was noted during this study (Min O2 79%).  Time spent < 89 was 50.1 minutes. - Patient snored 18.2% during the sleep.   DIAGNOSIS - Obstructive Sleep Apnea (G47.33) - Nocturnal Hypoxemia (G47.36)   RECOMMENDATIONS - In this patient with significant cardiovascular comorbidities and severe sleep apnea with previous supplemental O2 recommendation at 2 l/m recommend an in-lab titration study. If unable to obtain an in-lab study, initiate Auto - PAP with EPR of 3 at 14 - 20 and overnight oximetry evaluation. - Positional therapy avoiding supine position during sleep. - Avoid alcohol, sedatives and other CNS depressants that may worsen sleep apnea and disrupt normal sleep architecture. - Sleep hygiene should be reviewed to assess factors that may improve sleep quality. - Weight management (BMI 50) and regular exercise should be initiated or continued. - Recommend a downloiad and sleep clinic evaluation after one month of therapy.       ASSESSMENT:    1. OSA (obstructive sleep apnea)   2. Chronic combined systolic and  diastolic heart failure ([place])   3. NICM (nonischemic cardiomyopathy) ([place])   4. Essential hypertension   5. LVH (left ventricular hypertrophy) due to hypertensive disease, with heart failure ([place])   6. Morbid obesity ([place])   7. Tobacco abuse: quit January 2023   8. Type 2 diabetes mellitus without complication, without long-term current use of insulin [facility])     PLAN:  Mr. Senan Urey is a 56 year old African-American gentleman who has a history of morbid obesity, hypertension, diabetes mellitus, remote tobacco abuse, and obstructive sleep apnea.  Recently he developed  heart failure following COVID infection and is now on guideline directed medical therapy followed by Dr. Haroldine Morgan for his nonischemic cardiomyopathy.  Patient has a history of previously documented severe obstructive sleep apnea noted initially in 2021.  He never really followed up with therapy and was reevaluated most recently on April 09, 2021 which again confirmed severe obstructive sleep apnea with AHI 86.1/h.  He had significant oxygen desaturation to a nadir of 79% and had significant snoring.  I spent over 50 minutes with him today face-to-face in the office.  I reviewed his prior evaluations.  I reviewed with him in normal sleep architecture and the potential adverse consequences as a result of significant obstructive sleep apnea on his cardiovascular health.  I discussed its implications with reference to blood pressure control resulting from frequent nocturnal arousals increasing sympathetic tone, increased risk for nocturnal arrhythmias, resistant hypertension, and significant increased risk for development of atrial fibrillation.  In addition I discussed its effects on glucose and insulin resistance, increased inflammatory markers contributing to increased inflammation, as well as its effects on nocturnal mediated GERD.  In addition I discussed potential nocturnal hypoxemia contributing to nocturnal ischemia particularly if  underlying atherosclerosis is present in the setting of increased inflammation.  I reviewed his downloads in detail.  Although he is compliant with reference to usage, he is not compliant with reference to usage greater than 4 hours.  I discussed with him optimal sleep duration for an adult at 7 to 9 hours.  I discussed with him that typically sleep apnea is more severe during REM sleep and preponderance of REM sleep occurs in the second half of the night.  I discussed the importance of using CPAP for the nights entirety particularly since his risk of significant nocturnal hypoxemia is highest in the second half of the night during REM sleep.  He apparently has been taking his mask off after approximately 3 to 3-1/2 hours of use.  He continues to have nocturia approximately 3-4 times per night.  Although this may be contributed by his diuretic therapy, I discussed this significant increased nocturia associated with untreated sleep apnea I reviewed the pathophysiology of why this occurs.  I discussed insurance requirement that compliance needs to be met ideally within the 90-day window.  After a long discussion with him in the office of approximately 50 minutes, he now understands why he needs to use CPAP from a cardiovascular standpoint and the importance of using therapy for the entire nights duration.  [employer] is his DME company.  I am concerned that his 90-day window may be expiring.  I told him that it [employer] calls and wants to take his machine away that he discuss with them that they can also call with me since I believe he now understands the significance and importance of meeting CPAP compliance particularly with reference to his overall as well as cardiovascular health.  Hopefully he will be given additional time so that over the next month he can meet compliance standards with reference to more optimal use of daily therapy.  He was very appreciative of the time I spent with him and is now  significantly more aware of the importance of treatment.  Hopefully he will be able to continue with therapy and his machine will not be taken away by his insurance company.  I will see him in 1 to 2 months for follow-up evaluation.   Medication Adjustments/Labs and Tests Ordered: Current medicines are reviewed at length with the  patient today.  Concerns regarding medicines are outlined above.  Medication changes, Labs and Tests ordered today are listed in the Patient Instructions below. Patient Instructions  Medication Instructions:  Your physician recommends that you continue on your current medications as directed. Please refer to the Current Medication list given to you today.  *If you need a refill on your cardiac medications before your next appointment, please call your pharmacy*   Follow-Up: At [facility], you and your health needs are our priority.  As part of our continuing mission to provide you with exceptional heart care, we have created designated Provider Care Teams.  These Care Teams include your primary Cardiologist (physician) and Advanced Practice Providers (APPs -  Physician Assistants and Nurse Practitioners) who all work together to provide you with the care you need, when you need it.  We recommend signing up for the patient portal called "MyChart".  Sign up information is provided on this After Visit Summary.  MyChart is used to connect with patients for Virtual Visits (Telemedicine).  Patients are able to view lab/test results, encounter notes, upcoming appointments, etc.  Non-urgent messages can be sent to your provider as well.   To learn more about what you can do with MyChart, go to [email or web address].    Your next appointment:   Thursday, September 28th at 4pm  Provider:   Shelva Majestic, MD  Other Instructions Sleep Clinic   Signed, Randy Majestic, MD, [facility], [place], American Board of Sleep Medicine  08/16/2021 7:41 AM    [facility] [address] [place], [place], [place]  [address] Phone: [phone]

## 2021-08-14 NOTE — Patient Instructions (Signed)
Medication Instructions:  Your physician recommends that you continue on your current medications as directed. Please refer to the Current Medication list given to you today.  *If you need a refill on your cardiac medications before your next appointment, please call your pharmacy*   Follow-Up: At [facility], you and your health needs are our priority.  As part of our continuing mission to provide you with exceptional heart care, we have created designated Provider Care Teams.  These Care Teams include your primary Cardiologist (physician) and Advanced Practice Providers (APPs -  Physician Assistants and Nurse Practitioners) who all work together to provide you with the care you need, when you need it.  We recommend signing up for the patient portal called "MyChart".  Sign up information is provided on this After Visit Summary.  MyChart is used to connect with patients for Virtual Visits (Telemedicine).  Patients are able to view lab/test results, encounter notes, upcoming appointments, etc.  Non-urgent messages can be sent to your provider as well.   To learn more about what you can do with MyChart, go to [email or web address].    Your next appointment:   Thursday, September 28th at 4pm  Provider:   Shelva Majestic, MD  Other Instructions Sleep Clinic

## 2021-08-16 ENCOUNTER — Encounter (HOSPITAL_COMMUNITY): Payer: Self-pay | Admitting: Internal Medicine

## 2021-08-16 ENCOUNTER — Ambulatory Visit (HOSPITAL_COMMUNITY)
Admission: RE | Admit: 2021-08-16 | Discharge: 2021-08-16 | Disposition: A | Discharge status: Home / Self Care | Payer: 59 | Source: Ambulatory Visit | Attending: Internal Medicine | Admitting: Internal Medicine

## 2021-08-16 ENCOUNTER — Telehealth: Payer: Self-pay | Admitting: Pharmacist

## 2021-08-16 ENCOUNTER — Telehealth: Payer: Self-pay | Admitting: *Deleted

## 2021-08-16 ENCOUNTER — Encounter: Payer: Self-pay | Admitting: Cardiovascular Disease

## 2021-08-16 VITALS — BP 118/78 | HR 91 | Wt 322.8 lb

## 2021-08-16 DIAGNOSIS — E119 Type 2 diabetes mellitus without complications: Secondary | ICD-10-CM | POA: Diagnosis not present

## 2021-08-16 DIAGNOSIS — F17210 Nicotine dependence, cigarettes, uncomplicated: Secondary | ICD-10-CM | POA: Insufficient documentation

## 2021-08-16 DIAGNOSIS — Z79899 Other long term (current) drug therapy: Secondary | ICD-10-CM | POA: Insufficient documentation

## 2021-08-16 DIAGNOSIS — I5022 Chronic systolic (congestive) heart failure: Secondary | ICD-10-CM

## 2021-08-16 DIAGNOSIS — Z720 Tobacco use: Secondary | ICD-10-CM

## 2021-08-16 DIAGNOSIS — I110 Hypertensive heart disease with heart failure: Secondary | ICD-10-CM | POA: Diagnosis present

## 2021-08-16 DIAGNOSIS — I10 Essential (primary) hypertension: Secondary | ICD-10-CM

## 2021-08-16 DIAGNOSIS — Z7984 Long term (current) use of oral hypoglycemic drugs: Secondary | ICD-10-CM | POA: Insufficient documentation

## 2021-08-16 DIAGNOSIS — G4733 Obstructive sleep apnea (adult) (pediatric): Secondary | ICD-10-CM

## 2021-08-16 DIAGNOSIS — Z6841 Body Mass Index (BMI) 40.0 and over, adult: Secondary | ICD-10-CM | POA: Insufficient documentation

## 2021-08-16 DIAGNOSIS — I5023 Acute on chronic systolic (congestive) heart failure: Secondary | ICD-10-CM | POA: Insufficient documentation

## 2021-08-16 DIAGNOSIS — I3139 Other pericardial effusion (noninflammatory): Secondary | ICD-10-CM | POA: Insufficient documentation

## 2021-08-16 DIAGNOSIS — I5042 Chronic combined systolic (congestive) and diastolic (congestive) heart failure: Secondary | ICD-10-CM

## 2021-08-16 DIAGNOSIS — I428 Other cardiomyopathies: Secondary | ICD-10-CM

## 2021-08-16 LAB — BASIC METABOLIC PANEL
Anion gap: 11 (ref 5–15)
BUN: 20 mg/dL (ref 6–20)
CO2: 31 mmol/L (ref 22–32)
Calcium: 9 mg/dL (ref 8.9–10.3)
Chloride: 98 mmol/L (ref 98–111)
Creatinine, Ser: 1.27 mg/dL — ABNORMAL HIGH (ref 0.61–1.24)
GFR, Estimated: >60 mL/min (ref >60)
Glucose, Bld: 106 mg/dL — ABNORMAL HIGH (ref 70–99)
Potassium: 3.5 mmol/L (ref 3.5–5.1)
Sodium: 140 mmol/L (ref 135–145)

## 2021-08-16 LAB — BRAIN NATRIURETIC PEPTIDE: B Natriuretic Peptide: 7.8 pg/mL (ref 0.0–100.0)

## 2021-08-16 MED ORDER — METOPROLOL SUCCINATE ER 50 MG PO TB24: 50.0000 mg | ORAL_TABLET | Freq: Every day | ORAL | 3 refills | Status: DC

## 2021-08-16 NOTE — Addendum Note (Signed)
Encounter addended by: Jerl Mina, RN on: 08/16/2021 12:03 PM  Actions taken: Order list changed, Diagnosis association updated

## 2021-08-16 NOTE — Telephone Encounter (Signed)
Pt referred to PharmD for [facility] therapy for weight loss. Pt does not have DM, has Friday health plan scanned in which does not cover weight loss meds.  Spoke with pt who states he now has [employer]. We do not have this information on file. Advised pt that commercial plans are hit or miss on coverage, but that if they do cover Wegovy, it is currently on national backorder for at least the next 3-4 months and we have been unable to start any patients on this medication for some time now. Advised pt I would give him a call in December once shortage is anticipated to be over to see if his insurance covers [facility] then.

## 2021-08-16 NOTE — Addendum Note (Signed)
Encounter addended by: Jerl Mina, RN on: 08/16/2021 12:08 PM  Actions taken: Charge Capture section accepted, Clinical Note Signed

## 2021-08-16 NOTE — Telephone Encounter (Signed)
Randy Morgan called (Randy Morgan) said he has not used his machine enough to be compliant so insurance stopped paying and he needs to start over with in lab sleep study. Patient will drop his cpap off at [facility] this week.

## 2021-08-16 NOTE — Patient Instructions (Signed)
Increase Toprol to '50mg'$  daily.  Labs done today, your results will be available in MyChart, we will contact you for abnormal readings.  Your physician has requested that you have an echocardiogram. Echocardiography is a painless test that uses sound waves to create images of your heart. It provides your doctor with information about the size and shape of your heart and how well your heart's chambers and valves are working. This procedure takes approximately one hour. There are no restrictions for this procedure.  You have been referred to [place] for [facility].  Your physician recommends that you schedule a follow-up appointment in: 4 months.  If you have any questions or concerns before your next appointment please send [place] a message through [place] or call our office at [phone].    TO LEAVE A MESSAGE FOR THE NURSE SELECT OPTION 2, PLEASE LEAVE A MESSAGE INCLUDING: YOUR NAME DATE OF BIRTH CALL BACK NUMBER REASON FOR CALL**this is important as we prioritize the call backs  YOU WILL RECEIVE A CALL BACK THE SAME DAY AS LONG AS YOU CALL BEFORE 4:00 PM  At the [facility], you and your health needs are our priority. As part of our continuing mission to provide you with exceptional heart care, we have created designated Provider Care Teams. These Care Teams include your primary Cardiologist (physician) and Advanced Practice Providers (APPs- Physician Assistants and Nurse Practitioners) who all work together to provide you with the care you need, when you need it.   You may see any of the following providers on your designated Care Team at your next follow up: Dr Glori Bickers Dr Haynes Kerns, NP Lyda Jester, [place] [facility] [place], [place] Audry Riles, PharmD   Please be sure to bring in all your medications bottles to every appointment.

## 2021-08-16 NOTE — Progress Notes (Signed)
ADVANCED HF CLINIC NOTE   PCP: Vevelyn Francois, NP Primary Cardiologist: Dr Harrell Gave  HF Cardiologist: Dr. Haroldine Laws  HPI: Randy Morgan is a 56 y.o. with  morbid obesity, HTN, OSA, HFpEF, tobacco abuse, and DM2.   Sleep study 10/2019 AHI 80.with desaturations to 68%.   Admitted 01/30/21 with increased dyspnea in the setting of COVID. Echo EF 35-40%  He was volume overloaded and diuresed with IV lasix.  CT negative for PE. Started on GDMT. Discharge weight 310 pounds.   R/L Cath 02/16/21 Normal cors. LVEF 45% RA = 12 RV = 44/14 PA = 40/24 (21) PCW = 21 Fick cardiac output/index = 8.0/3.3 PVR = 0.9 WU FA sat = 97% PA sat =  78%, 80% SVC sat = 79%  Follow up 2/23, markedly volume up, weight 328 lbs. Entresto & spiro started, torsemide increased to 40 bid.   Seen back for f/u 4/23 and was still volume up in the setting of dietary indiscretion ReDs 43%.   Returns back today for f/u. Says he is feeling good. Going to gym ([employer]) everyday swimming in pool and doing calisthenics. Frustrated he is not losing weight. Gets some edema if he skips his meds but has been much more compliant. Breathing is "great". Wearing CPAP. Still smoking a few cigs/day   Cardiac Testing  Echo 2019 55-60% Grade I DD Mild LVH. Trivial pericardial effusion.  Echo 01/2021 EF 35-40% Moderate pericardial effusion. Severe LVH.   Past Medical History:  Diagnosis Date   Asthma    CHF (congestive heart failure) (HCC)    Hypertension    Sleep apnea    hasn't started CPAP yet    Current Outpatient Medications  Medication Sig Dispense Refill   empagliflozin (JARDIANCE) 10 MG TABS tablet Take 1 tablet (10 mg total) by mouth daily. 30 tablet 11   eplerenone (INSPRA) 50 MG tablet Take 1 tablet (50 mg total) by mouth daily. 30 tablet 6   metolazone (ZAROXOLYN) 2.5 MG tablet Take 2.5 mg by mouth as directed. By HF clinic     metoprolol succinate (TOPROL-XL) 25 MG 24 hr tablet Take 1 tablet (25 mg total) by  mouth daily. 30 tablet 11   potassium chloride SA (KLOR-CON M) 20 MEQ tablet Take 40 mEq by mouth daily. And 1 tablet in the pm     sacubitril-valsartan (ENTRESTO) 97-103 MG Take 1 tablet by mouth 2 (two) times daily. 60 tablet 11   torsemide (DEMADEX) 20 MG tablet Take 2 tablets (40 mg total) by mouth 2 (two) times daily. 120 tablet 11   No current facility-administered medications for this encounter.   No Known Allergies  Social History   Socioeconomic History   Marital status: Married    Spouse name: Not on file   Number of children: 2   Years of education: 12th grade + 2 years college   Highest education level: Some college, no degree  Occupational History   Occupation: makes concrete blocks and bricks   Occupation: Freight forwarder  Tobacco Use   Smoking status: Former    Packs/day: 0.50    Years: 25.00    Total pack years: 12.50    Types: Cigarettes    Quit date: 02/25/2021    Years since quitting: 0.4   Smokeless tobacco: Never  Vaping Use   Vaping Use: Never used  Substance and Sexual Activity   Alcohol use: Not Currently    Alcohol/week: 2.0 standard drinks of alcohol  Types: 2 Cans of beer per week    Comment: occ beer   Drug use: No   Sexual activity: Not Currently  Other Topics Concern   Not on file  Social History Narrative   Lives with his wife and daughter.   Other child lives independently in [place].   Drinks about 3 sodas a day       Pt stated he quit smoking   Social Determinants of Health   Financial Resource Strain: High Risk (02/01/2021)   Overall Financial Resource Strain (CARDIA)    Difficulty of Paying Living Expenses: Hard  Food Insecurity: No Food Insecurity (02/01/2021)   Hunger Vital Sign    Worried About Running Out of Food in the Last Year: Never true    Ran Out of Food in the Last Year: Never true  Transportation Needs: No Transportation Needs (02/01/2021)   PRAPARE - Hydrologist (Medical): No    Lack  of Transportation (Non-Medical): No  Physical Activity: Not on file  Stress: Not on file  Social Connections: Not on file  Intimate Partner Violence: Not on file   Family History  Problem Relation Age of Onset   Heart disease Mother    Diabetes Mother    Asthma Mother    Heart disease Father    Asthma Sister    Heart disease Brother    Heart attack Brother    Colon cancer Neg Hx    Esophageal cancer Neg Hx    Rectal cancer Neg Hx    Stomach cancer Neg Hx    BP 118/78   Pulse 91   Wt (!) 146.4 kg (322 lb 12.8 oz)   SpO2 94%   BMI 51.32 kg/m   Wt Readings from Last 3 Encounters:  08/16/21 (!) 146.4 kg (322 lb 12.8 oz)  08/14/21 (!) 145.4 kg (320 lb 9.6 oz)  06/07/21 (!) 144.6 kg (318 lb 12.8 oz)   PHYSICAL EXAM: General:  Well appearing. No resp difficulty HEENT: normal Neck: supple. no JVD. Carotids 2+ bilat; no bruits. No lymphadenopathy or thryomegaly appreciated. Cor: PMI nondisplaced. Regular rate & rhythm. No rubs, gallops or murmurs. Lungs: clear Abdomen: obese soft, nontender, nondistended. No hepatosplenomegaly. No bruits or masses. Good bowel sounds. Extremities: no cyanosis, clubbing, rash, edema Neuro: alert & orientedx3, cranial nerves grossly intact. moves all 4 extremities w/o difficulty. Affect pleasant   ASSESSMENT & PLAN: 1. Acute on chronic Systolic HF - Echo 6/29 47-65% LVH mod pericardial effusion. EF previously 55% in 2019.  - Cath 1/23; Normal cors. EF 45% (suspect HTN in nature). Mildly elevated pressures with high CO - Doing well. NYHA II Volume status ok  - Continue eplerenone 50 mg daily (breast tenderness w/spiro). - Continue torsemide 40 mg bid. - Increased Toprol to 50 mg daily.  - Continue Entresto 97/103 mg bid - Continue Jardiance 10 mg daily  - Repeat echo next visit - Labs today  2. Hypertension  - Blood pressure well controlled. Continue current regimen.  - continue CPAP   3. OSA, severe - now has CPAP device, reports full  compliance  - follows with Dr. Claiborne Billings  4. DM2 - Hgb A1C 6.1  - Continue Jardiance 10 mg daily.  - followed by PCP  - Will refer for GLP1RA.  5. Obesity, morbid - Body mass index is 51.32 kg/m. - Needs weight loss - Will refer for GLP1RA.  6. Tobacco Abuse - Former heavy smoker.  - Quit 1/23, but  now back to smoking 1 pp week. - encouraged cessation  Glori Bickers, MD  11:49 AM

## 2021-10-25 ENCOUNTER — Ambulatory Visit: Payer: 59 | Attending: Cardiovascular Disease | Admitting: Cardiovascular Disease

## 2021-10-25 ENCOUNTER — Encounter: Payer: Self-pay | Admitting: Cardiovascular Disease

## 2021-10-25 VITALS — BP 136/96 | HR 91 | Ht 66.5 in | Wt 325.4 lb

## 2021-10-25 DIAGNOSIS — I10 Essential (primary) hypertension: Secondary | ICD-10-CM

## 2021-10-25 DIAGNOSIS — I110 Hypertensive heart disease with heart failure: Secondary | ICD-10-CM

## 2021-10-25 DIAGNOSIS — I5042 Chronic combined systolic (congestive) and diastolic (congestive) heart failure: Secondary | ICD-10-CM

## 2021-10-25 DIAGNOSIS — G4733 Obstructive sleep apnea (adult) (pediatric): Secondary | ICD-10-CM

## 2021-10-25 DIAGNOSIS — I428 Other cardiomyopathies: Secondary | ICD-10-CM

## 2021-10-25 DIAGNOSIS — E119 Type 2 diabetes mellitus without complications: Secondary | ICD-10-CM

## 2021-10-25 NOTE — Progress Notes (Signed)
Cardiology Office Note    Date:  10/28/2021   ID:  Randy Morgan, DOB 1966/01/05, MRN [id number]  PCP:  Vevelyn Francois, NP  Cardiologist:  Shelva Majestic, MD (sleep); Dr. Haroldine Laws; Dr. Harrell Gave  2 month F/U sleep evaluation   History of Present Illness:  Randy Morgan is a 56 y.o. male who is followed by Dr. Al Pimple for primary cardiology care and Dr. Haroldine Laws for advanced heart failure.  I saw him for my initial sleep evaluation on August 14, 2021.  He presents for follow-up evaluation.  Randy Morgan has a history of morbid obesity, hypertension, diabetes mellitus, obstructive sleep apnea, tobacco abuse, and in January 2023 was admitted with increased dyspnea in the setting of COVID.  An echo Doppler study at that time showed reduced EF at 35 to 40% compared to a prior echo of 2019 where EF was 55 to 60%.  He also had a pericardial effusion and severe left ventricular hypertrophy.  He had undergone cardiac catheterization which showed normal coronary arteries with mild elevation of right heart pressures.  PVR was 0.9 WU.  He has been treated for volume overload and is now followed closely by Dr. Haroldine Laws and is on guideline directed medical therapy with metoprolol succinate, Entresto titrated up to 97/103 mg twice a day, eplerenone 50 mg daily, in addition to Jardiance 10 mg and torsemide 20 mg daily.  A cardiac MRI showed EF 29% with diffuse hypokinesis, moderate dilatation of RV with RV EF 34%, and there was no evidence for late gadolinium enhancement.  The patient has a previous history of previously documented sleep apnea and had undergone a prior diagnostic polysomnogram on August 27, 2019 which showed severe sleep apnea with an AHI of 80.6 and O2 nadir at 68%.  A titration study done on November 19, 2019 showed optimal CPAP pressure at approximately 15 cm and he required supplemental oxygen at 2 L/min.  Apparently, the patient never really followed up with CPAP  and was  not on treatment.  Due to concerns for continued significant obstructive sleep apnea with morbid obesity, significant snoring history, fatigability, and episodes of waking up gasping for breath, he was referred for a home sleep study which was done on April 09, 2021.  This reconfirmed severe sleep apnea with an AHI of 86.1/h.  There was a significant positional component with supine sleep AHI at 92/h versus nonsupine sleep AHI at 38.7/h.  Oxygen desaturated to a nadir of 79% and time spent less than 89% was 50.1 minutes.  He apparently did not undergo another titration study and AutoPap was initiated after he received a ResMed air sense 11 AutoSet CPAP unit on May 18, 2021.  His initial pressures were set at a minimum of 14 with maximum of 20 cm.  Typically, he goes to bed at 10 PM and often wakes up at 4 AM.  He had been experiencing nocturia at least 3-4 times per night.  Several downloads were obtained in the office today with an initial download of through Jun 14, 2021.  He was compliant with usage but not compliant with usage greater than 4 hours at only 3 hours and 25 minutes.  His 95th percentile pressure was 18.8 with maximum average pressure 19.2 and AHI was 3.1.  A subsequent download from May 18 through July 15, 2021 was similar with average use suboptimal at 3 hours and 20 minutes.  AHI was 3.2.  A download from initiation to August 13, 2021 was again consistent with usage days 84% and usage greater than 4 hours only 28%.  AHI was 2.9 and 95th percentile pressure was 17.9 with maximum average pressure of 18.4.  He has felt improved when he uses CPAP therapy.  Unfortunately, he often takes his mask off after coming back from the bathroom.  He continues to experience nocturia at least 3 times per night.  He is unaware of breakthrough snoring when using CPAP.  He continues to have residual daytime sleepiness and an Epworth Sleepiness Scale score was calculated in the office today which endorsed at 14 as  shown below:  Epworth Sleepiness Scale: Situation   Chance of Dozing/Sleeping (0 = never , 1 = slight chance , 2 = moderate chance , 3 = high chance )   sitting and reading 0   watching TV 2   sitting inactive in a public place 0   being a passenger in a motor vehicle for an hour or more 3   lying down in the afternoon 3   sitting and talking to someone 2   sitting quietly after lunch (no alcohol) 3   while stopped for a few minutes in traffic as the driver 1   Total Score  14    He is unaware of any bruxism.  He denies significant restless legs.  He is unaware of hypnopompic  or hypnagogic hallucinations or cataplectic events.   During my initial evaluation, I spent considerable time with Mr. Randy Morgan and extensively reviewed data regarding potential adverse consequences of untreated sleep apnea.  At the time, he was not meeting compliance standards with usage time at minimum greater than 4 hours for 70% of the nights.  Unfortunately, at the time of my extensive evaluation he was approaching his 90-day window for compliance.  After a long discussion with him he had a much better understanding of the importance of CPAP therapy particularly with his cardiovascular health.  I discussed with him that if his DME company called to see if there is any way they could extend his compliance time evaluation and not take his machine away.  Unfortunately, shortly thereafter his initial evaluation with me he was contacted by [employer] his DME company and said he did not meet compliance and he would need to return his CPAP unit.  If he wanted to reinstitute therapy he would need a new evaluation.  He is here with his wife today in the office.  He tells me that his DME company did not pick up his machine so he therefore still has it at home.  I discussed with him oftentimes he can be given an an opportunity to self purchase the machine at typically a discounted fee and hopefully this option still exist  for him since he still has his machine at home.  I again had a long discussion with him in the office today and discussed the importance of optimal sleep duration at 7 and 9 hours with his age of 56 years old.  He presents for evaluation  Past Medical History:  Diagnosis Date   Asthma    CHF (congestive heart failure) ([place])    Hypertension    Sleep apnea    hasn't started CPAP yet    Past Surgical History:  Procedure Laterality Date   NO PAST SURGERIES     RIGHT/LEFT HEART CATH AND CORONARY ANGIOGRAPHY N/A 02/16/2021   Procedure: RIGHT/LEFT HEART CATH AND CORONARY ANGIOGRAPHY;  Surgeon: Jolaine Artist, MD;  Location:  [facility];  Service: Cardiovascular;  Laterality: N/A;   VIDEO BRONCHOSCOPY Bilateral 12/09/2017   Procedure: VIDEO BRONCHOSCOPY WITHOUT FLUORO;  Surgeon: Laurin Coder, MD;  Location: [facility];  Service: Cardiopulmonary;  Laterality: Bilateral;    Current Medications: Outpatient Medications Prior to Visit  Medication Sig Dispense Refill   empagliflozin (JARDIANCE) 10 MG TABS tablet Take 1 tablet (10 mg total) by mouth daily. 30 tablet 11   eplerenone (INSPRA) 50 MG tablet Take 1 tablet (50 mg total) by mouth daily. 30 tablet 6   metolazone (ZAROXOLYN) 2.5 MG tablet Take 2.5 mg by mouth as directed. By HF clinic     metoprolol succinate (TOPROL-XL) 50 MG 24 hr tablet Take 1 tablet (50 mg total) by mouth daily. 90 tablet 3   potassium chloride SA (KLOR-CON M) 20 MEQ tablet Take 40 mEq by mouth daily. And 1 tablet in the pm     sacubitril-valsartan (ENTRESTO) 97-103 MG Take 1 tablet by mouth 2 (two) times daily. 60 tablet 11   torsemide (DEMADEX) 20 MG tablet Take 2 tablets (40 mg total) by mouth 2 (two) times daily. 120 tablet 11   No facility-administered medications prior to visit.     Allergies:   Patient has no known allergies.   Social History   Socioeconomic History   Marital status: Married    Spouse name: Not on file   Number of  children: 2   Years of education: 12th grade + 2 years college   Highest education level: Some college, no degree  Occupational History   Occupation: makes concrete blocks and bricks   Occupation: Freight forwarder  Tobacco Use   Smoking status: Former    Packs/day: 0.50    Years: 25.00    Total pack years: 12.50    Types: Cigarettes    Quit date: 02/25/2021    Years since quitting: 0.6   Smokeless tobacco: Never  Vaping Use   Vaping Use: Never used  Substance and Sexual Activity   Alcohol use: Not Currently    Alcohol/week: 2.0 standard drinks of alcohol    Types: 2 Cans of beer per week    Comment: occ beer   Drug use: No   Sexual activity: Not Currently  Other Topics Concern   Not on file  Social History Narrative   Lives with his wife and daughter.   Other child lives independently in [place].   Drinks about 3 sodas a day       Pt stated he quit smoking   Social Determinants of Health   Financial Resource Strain: High Risk (02/01/2021)   Overall Financial Resource Strain (CARDIA)    Difficulty of Paying Living Expenses: Hard  Food Insecurity: No Food Insecurity (02/01/2021)   Hunger Vital Sign    Worried About Running Out of Food in the Last Year: Never true    Ran Out of Food in the Last Year: Never true  Transportation Needs: No Transportation Needs (02/01/2021)   PRAPARE - Hydrologist (Medical): No    Lack of Transportation (Non-Medical): No  Physical Activity: Not on file  Stress: Not on file  Social Connections: Not on file    Socially, he was born in [place].  He lived in [place] and played high school football for [employer] in [place] as well as [employer].  He is married for 25 years and has 4 children.  He works as a  Freight forwarder.  He quit smoking in January 2023.  Family History:  The patient's family history includes Asthma in his mother and sister; Diabetes in his mother; Heart  attack in his brother; Heart disease in his brother, father, and mother.  His father is deceased and died at 14.  Mother is living at 3.  He has 1 brother who died of a myocardial infarction.  1 sister age 44 has asthma.  Sleep apnea he is present in family members.  ROS General: Negative; No fevers, chills, or night sweats;  HEENT: Negative; No changes in vision or hearing, sinus congestion, difficulty swallowing Pulmonary: Negative; No cough, wheezing, shortness of breath, hemoptysis Cardiovascular: See HPI; no current chest pain.  Lower extremity edema. GI: Negative; No nausea, vomiting, diarrhea, or abdominal pain GU: Negative; No dysuria, hematuria, or difficulty voiding Musculoskeletal: Negative; no myalgias, joint pain, or weakness Hematologic/Oncology: Negative; no easy bruising, bleeding Endocrine: Negative; no heat/cold intolerance; no diabetes Neuro: Negative; no changes in balance, headaches Skin: Negative; No rashes or skin lesions Psychiatric: Negative; No behavioral problems, depression Sleep: See HPI Other comprehensive 14 point system review is negative.   PHYSICAL EXAM:   VS:  BP (!) 136/96   Pulse 91   Ht 5' 6.5" (1.689 m)   Wt (!) 325 lb 6.4 oz (147.6 kg)   SpO2 91%   BMI 51.73 kg/m     Repeat blood pressure today by me was 124/85.  Wt Readings from Last 3 Encounters:  10/25/21 (!) 325 lb 6.4 oz (147.6 kg)  08/16/21 (!) 322 lb 12.8 oz (146.4 kg)  08/14/21 (!) 320 lb 9.6 oz (145.4 kg)     General: Alert, oriented, no distress.  Skin: normal turgor, no rashes, warm and dry HEENT: Normocephalic, atraumatic. Pupils equal round and reactive to light; sclera anicteric; extraocular muscles intact; 4 Nose without nasal septal hypertrophy Mouth/Parynx benign; Mallinpatti scale Neck: Thick neck no JVD, no carotid bruits; normal carotid upstroke Lungs: clear to ausculatation and percussion; no wheezing or rales Chest wall: without tenderness to  palpitation Heart: PMI not displaced, RRR, s1 s2 normal, 1/6 systolic murmur, no diastolic murmur, no rubs, gallops, thrills, or heaves Abdomen: Central adiposity;soft, nontender; no hepatosplenomehaly, BS+; abdominal aorta nontender and not dilated by palpation. Back: no CVA tenderness Pulses 2+ Musculoskeletal: full range of motion, normal strength, no joint deformities Extremities: Trace to 1+ lower extremity edema no clubbing cyanosis or edema, Homan's sign negative  Neurologic: grossly nonfocal; Cranial nerves grossly wnl Psychologic: Normal mood and affect   Studies/Labs Reviewed:   October 25, 2021 ECG (independently read by me): NSR at 91, QTc 491 msec  August 14, 2021 ECG (independently read by me): NSR at 88, QTc 496 msec, no ectopy  Recent Labs:    Latest Ref Rng & Units 08/16/2021   12:01 PM 06/15/2021    8:28 AM 06/07/2021   12:35 PM  BMP  Glucose 70 - 99 mg/dL 106  165  161   BUN 6 - 20 mg/dL _0 Creatinine 0.61 - 1.24 mg/dL 1.27  1.09  1.22   Sodium 135 - 145 mmol/L 140  140  140   Potassium 3.5 - 5.1 mmol/L 3.5  4.0  3.0   Chloride 98 - 111 mmol/L 98  105  99   CO2 22 - 32 mmol/L 31  32  33   Calcium 8.9 - 10.3 mg/dL 9.0  9.0  8.2  Latest Ref Rng & Units 03/07/2021    3:16 PM 02/01/2021   12:45 AM 03/02/2020   10:34 AM  Hepatic Function  Total Protein 6.0 - 8.5 g/dL 7.3  6.8  7.0   Albumin 3.8 - 4.9 g/dL 4.2  3.3  4.2   AST 0 - 40 IU/L _0 ALT 0 - 44 U/L  23    Alk Phosphatase 44 - 121 IU/L 69  49  63   Total Bilirubin 0.0 - 1.2 mg/dL 0.9  0.5  0.7   Bilirubin, Direct 0.0 - 0.2 mg/dL  0.2         Latest Ref Rng & Units 02/16/2021    9:59 AM 02/16/2021    9:52 AM 02/16/2021    9:49 AM  CBC  Hemoglobin 13.0 - 17.0 g/dL 11.9  13.9    14.6  14.6   Hematocrit 39.0 - 52.0 % 35.0  41.0    43.0  43.0    Lab Results  Component Value Date   MCV 89.7 02/09/2021   MCV 89.8 02/01/2021   MCV 87.5 01/30/2021   Lab Results  Component  Value Date   TSH 0.502 02/01/2021   Lab Results  Component Value Date   HGBA1C 6.1 (H) 02/01/2021     BNP    Component Value Date/Time   BNP 7.8 08/16/2021 1203    ProBNP No results found for: "PROBNP"   Lipid Panel     Component Value Date/Time   CHOL 166 02/03/2021 0327   CHOL 220 (H) 03/02/2020 1034   TRIG 160 (H) 02/03/2021 0327   HDL 36 (L) 02/03/2021 0327   HDL 36 (L) 03/02/2020 1034   CHOLHDL 4.6 02/03/2021 0327   VLDL 32 02/03/2021 0327   LDLCALC 98 02/03/2021 0327   LDLCALC 154 (H) 03/02/2020 1034   LABVLDL 30 03/02/2020 1034     RADIOLOGY: No results found.   Additional studies/ records that were reviewed today include:     Patient Name: Crue, Otero Date: 04/09/2021 Gender: Male D.O.B: 01/21/1966 Age (years): 25 Referring Provider: Dionisio David NP Height (inches): 66 Interpreting Physician: Shelva Majestic MD, ABSM Weight (lbs): 310 RPSGT: Jacolyn Reedy BMI: 50 MRN: [id number] Neck Size: 19.00   CLINICAL INFORMATION Sleep Study Type: HST   Indication for sleep study: Weight Gain, Witnessed Apneas, Witnesses Apnea / Gasping During Sleep   Epworth Sleepiness Score: 13   Most recent polysomnogram 08/27/2019: AHI 80.6/h, O2 nadir 68%. Most recent titration study dated 11/19/2019 was optimal at 15 cm H2O with supplemental O2 at 2l/min recommendation. NSVT was noted during the study.   SLEEP STUDY TECHNIQUE A multi-channel overnight portable sleep study was performed. The channels recorded were: nasal airflow, thoracic respiratory movement, and oxygen saturation with a pulse oximetry. Snoring was also monitored.   MEDICATIONS empagliflozin (JARDIANCE) 10 MG TABS tablet metoprolol succinate (TOPROL-XL) 25 MG 24 hr tablet potassium chloride SA (KLOR-CON M) 20 MEQ tablet sacubitril-valsartan (ENTRESTO) 97-103 MG spironolactone (ALDACTONE) 25 MG tablet torsemide (DEMADEX) 20 MG tablet   Patient self administered medications  include: N/A.   SLEEP ARCHITECTURE Patient was studied for 368.5 minutes. The sleep efficiency was 100.0 % and the patient was supine for 91.5%. The arousal index was 0.0 per hour.   RESPIRATORY PARAMETERS The overall AHI was 86.1 per hour, with a central apnea index of 0 per hour.   The oxygen nadir was 79% during sleep.   CARDIAC DATA Mean heart rate during  sleep was 80.6 bpm.   IMPRESSIONS - Severe obstructive sleep apnea occurred during this study (AHI 86.1/h). There is a positional component with supine sleep AHI 92/h versus non-supine AHI 38.7/h. - Severe oxygen desaturation was noted during this study (Min O2 79%).  Time spent < 89 was 50.1 minutes. - Patient snored 18.2% during the sleep.   DIAGNOSIS - Obstructive Sleep Apnea (G47.33) - Nocturnal Hypoxemia (G47.36)   RECOMMENDATIONS - In this patient with significant cardiovascular comorbidities and severe sleep apnea with previous supplemental O2 recommendation at 2 l/m recommend an in-lab titration study. If unable to obtain an in-lab study, initiate Auto - PAP with EPR of 3 at 14 - 20 and overnight oximetry evaluation. - Positional therapy avoiding supine position during sleep. - Avoid alcohol, sedatives and other CNS depressants that may worsen sleep apnea and disrupt normal sleep architecture. - Sleep hygiene should be reviewed to assess factors that may improve sleep quality. - Weight management (BMI 50) and regular exercise should be initiated or continued. - Recommend a downloiad and sleep clinic evaluation after one month of therapy.       ASSESSMENT:    1. Obstructive sleep apnea (adult) (pediatric)   2. Chronic combined systolic and diastolic heart failure ([place])   3. NICM (nonischemic cardiomyopathy) ([place])   4. Essential hypertension   5. Morbid obesity ([place])   6. Type 2 diabetes mellitus without complication, without long-term current use of insulin (HCC)   7. LVH (left ventricular hypertrophy) due to  hypertensive disease, with heart failure [facility])     PLAN:  Randy Morgan is a 56 year old African-American gentleman who has a history of morbid obesity, hypertension, diabetes mellitus, remote tobacco abuse, and obstructive sleep apnea.  He developed heart failure following COVID infection and is now on guideline directed medical therapy followed by Dr. Haroldine Laws for his nonischemic cardiomyopathy.  He has a history of previously diagnosed severe obstructive sleep apnea initially demonstrated in 2021.  However at that time he never followed up with therapy.  On reevaluation in March 2023 he again was found to have severe obstructive sleep apnea with an AHI of 86.1/h and significant oxygenation to a nadir of 79% with significant loud scoring.  During his initial evaluation I had a very lengthy discussion with him in the office regarding normal sleep architecture, the contribution of obstructive sleep apnea regarding abnormal sleep architecture and thoroughly reviewed potential adverse cardiovascular consequences of untreated sleep apnea.  I discussed compliance typically from an insurance company at achieving at least 70% of daily use with at least 70% of the nights greater than 4 hours.  However I discussed with him optimal sleep duration is 7 and 9 hours and particularly since usually sleep apnea is most severe during REM sleep that the ponderous of REM sleep occurs in the second half of the night I stressed the importance of continuing to use CPAP for the nights duration.  His DME company has told him he needs to return his machine.  They did not come and pick his machine up and as a result he still has his machine at home.  I discussed with him today that he should offer the opportunity to purchase the machine typically at a reduced rate and therefore can continue to use CPAP therapy.  I again reiterated the importance of duration of treatment for the nights entirety rather than just only 4 hours per  night particularly with his cardiovascular comorbidities.  His wife now also has a  good understanding as to the importance of continued therapy.  Hopefully he will be able to keep his machine and we will monitor continue to monitor him.  If his machine is returned without his purchase unfortunately it a new evaluation will be necessary from an insurance standpoint.  His blood pressure today is stable and improved at 124/85 when taken by me on his current regimen consisting of valsartan 97/103 twice a day, metoprolol succinate 50 mg daily, impure alone 50 mg daily, Jardiance 10 mg daily, metolazone 2.5 mg as needed in addition to torsemide 40 mg twice a day.  He will be having a follow-up CHF appointment next week.  I will see him in 6 months for reevaluation or sooner as needed.     Medication Adjustments/Labs and Tests Ordered: Current medicines are reviewed at length with the patient today.  Concerns regarding medicines are outlined above.  Medication changes, Labs and Tests ordered today are listed in the Patient Instructions below. Patient Instructions  Medication Instructions:  The current medical regimen is effective;  continue present plan and medications.  *If you need a refill on your cardiac medications before your next appointment, please call your pharmacy*  Follow-Up: At [facility], you and your health needs are our priority.  As part of our continuing mission to provide you with exceptional heart care, we have created designated Provider Care Teams.  These Care Teams include your primary Cardiologist (physician) and Advanced Practice Providers (APPs -  Physician Assistants and Nurse Practitioners) who all work together to provide you with the care you need, when you need it.  We recommend signing up for the patient portal called "MyChart".  Sign up information is provided on this After Visit Summary.  MyChart is used to connect with patients for Virtual Visits (Telemedicine).   Patients are able to view lab/test results, encounter notes, upcoming appointments, etc.  Non-urgent messages can be sent to your provider as well.   To learn more about what you can do with MyChart, go to [email or web address].    Your next appointment:   6 month(s)  The format for your next appointment:   In Person  Provider:   Shelva Majestic (SLEEP)        Signed, Shelva Majestic, MD, [facility], [place], American Board of Sleep Medicine  10/28/2021 7:07 PM    [place] [address], [place], [place], [place]  [address] Phone: [phone]

## 2021-10-25 NOTE — Patient Instructions (Signed)
Medication Instructions:  The current medical regimen is effective;  continue present plan and medications.  *If you need a refill on your cardiac medications before your next appointment, please call your pharmacy*  Follow-Up: At [facility], you and your health needs are our priority.  As part of our continuing mission to provide you with exceptional heart care, we have created designated Provider Care Teams.  These Care Teams include your primary Cardiologist (physician) and Advanced Practice Providers (APPs -  Physician Assistants and Nurse Practitioners) who all work together to provide you with the care you need, when you need it.  We recommend signing up for the patient portal called "MyChart".  Sign up information is provided on this After Visit Summary.  MyChart is used to connect with patients for Virtual Visits (Telemedicine).  Patients are able to view lab/test results, encounter notes, upcoming appointments, etc.  Non-urgent messages can be sent to your provider as well.   To learn more about what you can do with MyChart, go to [email or web address].    Your next appointment:   6 month(s)  The format for your next appointment:   In Person  Provider:   Shelva Majestic (SLEEP)

## 2021-10-28 ENCOUNTER — Encounter: Payer: Self-pay | Admitting: Cardiovascular Disease

## 2021-10-29 ENCOUNTER — Encounter (HOSPITAL_COMMUNITY): Payer: 59

## 2021-10-29 ENCOUNTER — Ambulatory Visit (HOSPITAL_COMMUNITY): Payer: 59

## 2021-10-31 ENCOUNTER — Encounter (HOSPITAL_COMMUNITY): Payer: 59

## 2021-10-31 ENCOUNTER — Ambulatory Visit (HOSPITAL_COMMUNITY): Admission: RE | Admit: 2021-10-31 | Payer: 59 | Source: Ambulatory Visit

## 2021-11-01 ENCOUNTER — Ambulatory Visit (HOSPITAL_COMMUNITY)
Admission: RE | Admit: 2021-11-01 | Discharge: 2021-11-01 | Disposition: A | Discharge status: Home / Self Care | Payer: 59 | Source: Ambulatory Visit | Attending: Cardiology | Admitting: Cardiology

## 2021-11-01 ENCOUNTER — Other Ambulatory Visit (HOSPITAL_COMMUNITY): Payer: Self-pay

## 2021-11-01 ENCOUNTER — Ambulatory Visit (HOSPITAL_BASED_OUTPATIENT_CLINIC_OR_DEPARTMENT_OTHER)
Admission: RE | Admit: 2021-11-01 | Discharge: 2021-11-01 | Disposition: A | Discharge status: Home / Self Care | Payer: 59 | Source: Ambulatory Visit | Attending: Internal Medicine | Admitting: Internal Medicine

## 2021-11-01 ENCOUNTER — Telehealth (HOSPITAL_COMMUNITY): Payer: Self-pay | Admitting: Pharmacy Technician

## 2021-11-01 ENCOUNTER — Encounter (HOSPITAL_COMMUNITY): Payer: Self-pay

## 2021-11-01 VITALS — BP 144/78 | HR 75 | Wt 324.2 lb

## 2021-11-01 DIAGNOSIS — Z6841 Body Mass Index (BMI) 40.0 and over, adult: Secondary | ICD-10-CM | POA: Insufficient documentation

## 2021-11-01 DIAGNOSIS — I10 Essential (primary) hypertension: Secondary | ICD-10-CM

## 2021-11-01 DIAGNOSIS — I5022 Chronic systolic (congestive) heart failure: Secondary | ICD-10-CM

## 2021-11-01 DIAGNOSIS — E119 Type 2 diabetes mellitus without complications: Secondary | ICD-10-CM | POA: Insufficient documentation

## 2021-11-01 DIAGNOSIS — Z87891 Personal history of nicotine dependence: Secondary | ICD-10-CM | POA: Insufficient documentation

## 2021-11-01 DIAGNOSIS — Z79899 Other long term (current) drug therapy: Secondary | ICD-10-CM | POA: Insufficient documentation

## 2021-11-01 DIAGNOSIS — I5042 Chronic combined systolic (congestive) and diastolic (congestive) heart failure: Secondary | ICD-10-CM | POA: Insufficient documentation

## 2021-11-01 DIAGNOSIS — E118 Type 2 diabetes mellitus with unspecified complications: Secondary | ICD-10-CM

## 2021-11-01 DIAGNOSIS — Z7984 Long term (current) use of oral hypoglycemic drugs: Secondary | ICD-10-CM | POA: Insufficient documentation

## 2021-11-01 DIAGNOSIS — I110 Hypertensive heart disease with heart failure: Secondary | ICD-10-CM | POA: Insufficient documentation

## 2021-11-01 DIAGNOSIS — G4733 Obstructive sleep apnea (adult) (pediatric): Secondary | ICD-10-CM | POA: Insufficient documentation

## 2021-11-01 LAB — BASIC METABOLIC PANEL
Anion gap: 9 (ref 5–15)
BUN: 13 mg/dL (ref 6–20)
CO2: 27 mmol/L (ref 22–32)
Calcium: 9.2 mg/dL (ref 8.9–10.3)
Chloride: 99 mmol/L (ref 98–111)
Creatinine, Ser: 0.99 mg/dL (ref 0.61–1.24)
GFR, Estimated: >60 mL/min (ref >60)
Glucose, Bld: 130 mg/dL — ABNORMAL HIGH (ref 70–99)
Potassium: 4 mmol/L (ref 3.5–5.1)
Sodium: 135 mmol/L (ref 135–145)

## 2021-11-01 LAB — ECHOCARDIOGRAM COMPLETE
Area-P 1/2: 2.17 cm2
S' Lateral: 3.3 cm

## 2021-11-01 LAB — BRAIN NATRIURETIC PEPTIDE: B Natriuretic Peptide: 44 pg/mL (ref 0.0–100.0)

## 2021-11-01 MED ORDER — EMPAGLIFLOZIN 10 MG PO TABS: 10.0000 mg | ORAL_TABLET | Freq: Every day | ORAL | 11 refills | Status: DC

## 2021-11-01 MED ORDER — METOLAZONE 2.5 MG PO TABS: 2.5000 mg | ORAL_TABLET | ORAL | 0 refills | Status: DC

## 2021-11-01 NOTE — Patient Instructions (Addendum)
RESTART Jardiance 10 mg one tab daily TAKE Metolazone 2.5 mg one tab today with an extra 40 meq of potassium   Your physician recommends that you schedule a follow-up appointment in: 2 weeks  in the Advanced Practitioners (PA/NP) Clinic      You have been referred to [place] -they wlil be in contact with an appointment   Do the following things EVERYDAY: Weigh yourself in the morning before breakfast. Write it down and keep it in a log. Take your medicines as prescribed Eat low salt foods--Limit salt (sodium) to 2000 mg per day.  Stay as active as you can everyday Limit all fluids for the day to less than 2 liters   At the [facility], you and your health needs are our priority. As part of our continuing mission to provide you with exceptional heart care, we have created designated Provider Care Teams. These Care Teams include your primary Cardiologist (physician) and Advanced Practice Providers (APPs- Physician Assistants and Nurse Practitioners) who all work together to provide you with the care you need, when you need it.   You may see any of the following providers on your designated Care Team at your next follow up: Dr Glori Bickers Dr Loralie Champagne Dr. Roxana Hires, NP Lyda Jester, [place] [facility], [place] Forestine Na, NP Audry Riles, PharmD   Please be sure to bring in all your medications bottles to every appointment.

## 2021-11-01 NOTE — Telephone Encounter (Signed)
Patient Advocate Encounter   Received notification from [place] that prior authorization for Randy Morgan is required.   PA submitted on CoverMyMeds Key [id number] Status is pending   Will continue to follow.

## 2021-11-01 NOTE — Progress Notes (Signed)
ADVANCED HF CLINIC NOTE   PCP: Vevelyn Francois, NP Primary Cardiologist: Dr Harrell Gave  HF Cardiologist: Dr. Haroldine Laws  HPI: Randy Morgan is a 56 y.o. with  morbid obesity, HTN, OSA, HFpEF, tobacco abuse, and DM2.   Sleep study 10/2019 AHI 80.with desaturations to 68%.   Admitted 01/30/21 with increased dyspnea in the setting of COVID. Echo EF 35-40%.  He was volume overloaded and diuresed with IV lasix.  CT negative for PE. Started on GDMT. Discharge weight 310 pounds.   R/L Cath 02/16/21 Normal cors. LVEF 45% RA = 12 RV = 44/14 PA = 40/24 (21) PCW = 21 Fick cardiac output/index = 8.0/3.3 PVR = 0.9 WU FA sat = 97% PA sat =  78%, 80% SVC sat = 79%  Follow up 2/23, markedly volume up, weight 328 lbs. Entresto & spiro started, torsemide increased to 40 bid.   Seen back for f/u 4/23 and was still volume up in the setting of dietary indiscretion ReDs 43%. Diuretics adjusted. Volume improved on return f/u.   Returns back today for f/u and repeat echo. Volume overloaded in the setting of running out of Jardiance x 2 wks + dietary indiscretion w/ sodium. Reports he has all other meds at home and has been taking as directed, although has not yet taken am meds today. Reports ~10 lb wt gain in the last week. 2+ b/l LEE on exam. Recent increase in exertional dyspnea, NYHA Class III. Denies CP. BP moderately elevated, 144/78. Asking if he can get started on Ozempic.   Echo was done today, interpretation pending.   He quit smoking. Reports compliance w/ CPAP.   Cardiac Testing  Echo 2019 55-60% Grade I DD Mild LVH. Trivial pericardial effusion.  Echo 01/2021 EF 35-40% Moderate pericardial effusion. Severe LVH.   Past Medical History:  Diagnosis Date   Asthma    CHF (congestive heart failure) (HCC)    Hypertension    Sleep apnea    hasn't started CPAP yet    Current Outpatient Medications  Medication Sig Dispense Refill   empagliflozin (JARDIANCE) 10 MG TABS tablet Take 1  tablet (10 mg total) by mouth daily before breakfast. 30 tablet 11   eplerenone (INSPRA) 50 MG tablet Take 1 tablet (50 mg total) by mouth daily. 30 tablet 6   metoprolol succinate (TOPROL-XL) 50 MG 24 hr tablet Take 1 tablet (50 mg total) by mouth daily. 90 tablet 3   potassium chloride SA (KLOR-CON M) 20 MEQ tablet Take 40 mEq by mouth daily. And 1 tablet in the pm     sacubitril-valsartan (ENTRESTO) 97-103 MG Take 1 tablet by mouth 2 (two) times daily. 60 tablet 11   torsemide (DEMADEX) 20 MG tablet Take 2 tablets (40 mg total) by mouth 2 (two) times daily. 120 tablet 11   metolazone (ZAROXOLYN) 2.5 MG tablet Take 1 tablet (2.5 mg total) by mouth as directed. By HF clinic 3 tablet 0   No current facility-administered medications for this encounter.   No Known Allergies  Social History   Socioeconomic History   Marital status: Married    Spouse name: Not on file   Number of children: 2   Years of education: 12th grade + 2 years college   Highest education level: Some college, no degree  Occupational History   Occupation: makes concrete blocks and bricks   Occupation: Freight forwarder  Tobacco Use   Smoking status: Former    Packs/day: 0.50    Years:  25.00    Total pack years: 12.50    Types: Cigarettes    Quit date: 02/25/2021    Years since quitting: 0.6   Smokeless tobacco: Never  Vaping Use   Vaping Use: Never used  Substance and Sexual Activity   Alcohol use: Not Currently    Alcohol/week: 2.0 standard drinks of alcohol    Types: 2 Cans of beer per week    Comment: occ beer   Drug use: No   Sexual activity: Not Currently  Other Topics Concern   Not on file  Social History Narrative   Lives with his wife and daughter.   Other child lives independently in [place].   Drinks about 3 sodas a day       Pt stated he quit smoking   Social Determinants of Health   Financial Resource Strain: High Risk (02/01/2021)   Overall Financial Resource Strain (CARDIA)     Difficulty of Paying Living Expenses: Hard  Food Insecurity: No Food Insecurity (02/01/2021)   Hunger Vital Sign    Worried About Running Out of Food in the Last Year: Never true    Ran Out of Food in the Last Year: Never true  Transportation Needs: No Transportation Needs (02/01/2021)   PRAPARE - Hydrologist (Medical): No    Lack of Transportation (Non-Medical): No  Physical Activity: Not on file  Stress: Not on file  Social Connections: Not on file  Intimate Partner Violence: Not on file   Family History  Problem Relation Age of Onset   Heart disease Mother    Diabetes Mother    Asthma Mother    Heart disease Father    Asthma Sister    Heart disease Brother    Heart attack Brother    Colon cancer Neg Hx    Esophageal cancer Neg Hx    Rectal cancer Neg Hx    Stomach cancer Neg Hx    BP (!) 144/78   Pulse 75   Wt (!) 147.1 kg (324 lb 3.2 oz)   SpO2 96%   BMI 51.54 kg/m   Wt Readings from Last 3 Encounters:  11/01/21 (!) 147.1 kg (324 lb 3.2 oz)  10/25/21 (!) 147.6 kg (325 lb 6.4 oz)  08/16/21 (!) 146.4 kg (322 lb 12.8 oz)   PHYSICAL EXAM: General:  obese middle aged male. No resp difficulty HEENT: normal Neck: supple. Thick neck, JVD not well visualized. Carotids 2+ bilat; no bruits. No lymphadenopathy or thryomegaly appreciated. Cor: PMI nondisplaced. Regular rate & rhythm. No rubs, gallops or murmurs. Lungs: decreased BS at the bases bilaterally  Abdomen: obese soft, nontender, nondistended. No hepatosplenomegaly. No bruits or masses. Good bowel sounds. Extremities: no cyanosis, clubbing, rash, 2+ b/l pitting LE edema Neuro: alert & orientedx3, cranial nerves grossly intact. moves all 4 extremities w/o difficulty. Affect pleasant   ASSESSMENT & PLAN: 1. Acute on chronic Systolic HF - Echo 1/30 86-57% LVH mod pericardial effusion. EF previously 55% in 2019.  - Cath 1/23; Normal cors. EF 45% (suspect HTN in nature). Mildly elevated  pressures with high CO - Echo repeated today, interpretation pending - Volume overload w/ worsening NYHA Class III symptoms, in setting of running out of Jardiance x 2 wks + dietary indiscretion w/ sodium  - Restart Jardiance 10 mg daily  - Take 2.5 mg of metolazone x 1 today w/ scheduled torsemide + extra 40 mEq of KCl (he can repeat this on Saturday if c/w LEE) -  Continue Entresto 97/103 mg bid - Continue eplerenone 50 mg daily (breast tenderness w/spiro). - Continue torsemide 40 mg bid. - Continue Toprol to 50 mg daily.  - Advised fluid and sodium restriction  - BMP today  - f/u in 2 wks to reassess volume status  2. Hypertension  - Moderately elevated in setting of volume overload - Continue current regimen, see diuretic dose adjustment above  - continue CPAP   3. OSA, severe - now has CPAP device, reports full compliance  - follows with Dr. Claiborne Billings  4. DM2 - Hgb A1C 6.1  - Continue Jardiance 10 mg daily.  - followed by PCP  - Will refer for GLP1RA.  5. Obesity, morbid - Body mass index is 51.54 kg/m. - Needs weight loss - Will refer for GLP1RA.  6. Tobacco Abuse - Former heavy smoker - Recently quit, congratulated on efforts  F/u w/ APP in 2 wks to reassess volume status   [employer], PA-C  9:53 AM

## 2021-11-01 NOTE — Telephone Encounter (Signed)
Advanced Heart Failure Patient Advocate Encounter  Prior Authorization for Randy Morgan has been approved.    PA# [id number] Effective dates: 11/01/21 through 11/02/22  Charlann Boxer, CPhT

## 2021-11-02 ENCOUNTER — Encounter: Payer: Self-pay | Admitting: Cardiovascular Disease

## 2021-11-02 ENCOUNTER — Telehealth: Payer: Self-pay | Admitting: Cardiovascular Disease

## 2021-11-02 NOTE — Telephone Encounter (Signed)
Caprice Beaver, LPN  P Cv Div Nl Scheduling Please call and get patient in for a 6 month follow up with Dr.Kelly.    Called 3x with no success in reaching patient or being able to LVM. Will send reminder letter

## 2021-11-14 NOTE — Progress Notes (Signed)
ADVANCED HF CLINIC NOTE   PCP: Vevelyn Francois, NP Primary Cardiologist: Dr Harrell Gave  HF Cardiologist: Dr. Haroldine Laws  HPI: Randy Morgan is a 56 y.o. with  morbid obesity, HTN, OSA, HFpEF, tobacco abuse, and DM2.   Sleep study 10/2019 AHI 80.with desaturations to 68%.   Admitted 01/30/21 with increased dyspnea in the setting of COVID. Echo EF 35-40%.  He was volume overloaded and diuresed with IV lasix.  CT negative for PE. Started on GDMT. Discharge weight 310 pounds.   R/L Cath 02/16/21 Normal cors. LVEF 45% RA = 12 RV = 44/14 PA = 40/24 (21) PCW = 21 Fick cardiac output/index = 8.0/3.3 PVR = 0.9 WU FA sat = 97% PA sat =  78%, 80% SVC sat = 79%  Follow up 2/23, markedly volume up, weight 328 lbs. Entresto & spiro started, torsemide increased to 40 bid.   Seen back for f/u 4/23 and was still volume up in the setting of dietary indiscretion ReDs 43%. Diuretics adjusted. Volume improved on return f/u.   HF visit 11/01/21. Out of jardiance x 2 weeks. Jardiance restarted and instructed to take metolazone x 1. He was unable to get Jardiance at the pharmacy. Also referred to ozempic clinic but they were unable to reach him. He says his number was changed.   Today he returns for HF follow up. Says he feels like he has fluid.  SOB with exertion + orthopnea. Denies PND. Using CPAP every now and then. Drinking lots of fluid. Appetite has been huge. Eating lots of fast food.  No fever or chills. Weight at home 318 pounds. Taking all medications but did not take medications today.    Cardiac Testing  Echo 2019 55-60% Grade I DD Mild LVH. Trivial pericardial effusion.  Echo 01/2021 EF 35-40% Moderate pericardial effusion. Severe LVH.  Echo 10/2021 EF 55-60% RV mildly reduced. Moderate LVH.   Past Medical History:  Diagnosis Date   Asthma    CHF (congestive heart failure) (HCC)    Hypertension    Sleep apnea    hasn't started CPAP yet    Current Outpatient Medications   Medication Sig Dispense Refill   eplerenone (INSPRA) 50 MG tablet Take 1 tablet (50 mg total) by mouth daily. 30 tablet 6   metolazone (ZAROXOLYN) 2.5 MG tablet Take 1 tablet (2.5 mg total) by mouth as directed. By HF clinic 3 tablet 0   metoprolol succinate (TOPROL-XL) 50 MG 24 hr tablet Take 1 tablet (50 mg total) by mouth daily. 90 tablet 3   potassium chloride SA (KLOR-CON M) 20 MEQ tablet Take 40 mEq by mouth daily. And 1 tablet in the pm     sacubitril-valsartan (ENTRESTO) 97-103 MG Take 1 tablet by mouth 2 (two) times daily. 60 tablet 11   torsemide (DEMADEX) 20 MG tablet Take 2 tablets (40 mg total) by mouth 2 (two) times daily. 120 tablet 11   No current facility-administered medications for this encounter.   No Known Allergies  Social History   Socioeconomic History   Marital status: Married    Spouse name: Not on file   Number of children: 2   Years of education: 12th grade + 2 years college   Highest education level: Some college, no degree  Occupational History   Occupation: makes concrete blocks and bricks   Occupation: Freight forwarder  Tobacco Use   Smoking status: Former    Packs/day: 0.50    Years: 25.00    Total  pack years: 12.50    Types: Cigarettes    Quit date: 02/25/2021    Years since quitting: 0.7   Smokeless tobacco: Never  Vaping Use   Vaping Use: Never used  Substance and Sexual Activity   Alcohol use: Not Currently    Alcohol/week: 2.0 standard drinks of alcohol    Types: 2 Cans of beer per week    Comment: occ beer   Drug use: No   Sexual activity: Not Currently  Other Topics Concern   Not on file  Social History Narrative   Lives with his wife and daughter.   Other child lives independently in [place].   Drinks about 3 sodas a day       Pt stated he quit smoking   Social Determinants of Health   Financial Resource Strain: High Risk (02/01/2021)   Overall Financial Resource Strain (CARDIA)    Difficulty of Paying Living Expenses:  Hard  Food Insecurity: No Food Insecurity (02/01/2021)   Hunger Vital Sign    Worried About Running Out of Food in the Last Year: Never true    Ran Out of Food in the Last Year: Never true  Transportation Needs: No Transportation Needs (02/01/2021)   PRAPARE - Hydrologist (Medical): No    Lack of Transportation (Non-Medical): No  Physical Activity: Not on file  Stress: Not on file  Social Connections: Not on file  Intimate Partner Violence: Not on file   Family History  Problem Relation Age of Onset   Heart disease Mother    Diabetes Mother    Asthma Mother    Heart disease Father    Asthma Sister    Heart disease Brother    Heart attack Brother    Colon cancer Neg Hx    Esophageal cancer Neg Hx    Rectal cancer Neg Hx    Stomach cancer Neg Hx    BP (!) 148/72   Pulse 88   Wt (!) 148.3 kg (327 lb)   SpO2 95%   BMI 51.99 kg/m   Wt Readings from Last 3 Encounters:  11/15/21 (!) 148.3 kg (327 lb)  11/01/21 (!) 147.1 kg (324 lb 3.2 oz)  10/25/21 (!) 147.6 kg (325 lb 6.4 oz)   PHYSICAL EXAM: General:   No resp difficulty HEENT: normal Neck: supple. no JVD. Carotids 2+ bilat; no bruits. No lymphadenopathy or thryomegaly appreciated. Cor: PMI nondisplaced. Regular rate & rhythm. No rubs, gallops or murmurs. Lungs: clear Abdomen: soft, nontender, distended. No hepatosplenomegaly. No bruits or masses. Good bowel sounds. Extremities: no cyanosis, clubbing, rash, R and LLE 2+ edema Neuro: alert & orientedx3, cranial nerves grossly intact. moves all 4 extremities w/o difficulty. Affect pleasant    ASSESSMENT & PLAN: 1. Chronic HFiEF - Echo 1/23 35-40% LVH mod pericardial effusion. EF previously 55% in 2019.  - Cath 1/23; Normal cors. EF 45% (suspect HTN in nature). Mildly elevated pressures with high CO - Echo 10/2021 EF improved 55-60%. RV mildly reduced. Discussed ECHO results during OV.  - NYHA III.  - Volume status elevated in setting high  sodium diet. Increase torsemide 60 mg twice a day and he was instructed to take 2.5 mg metolazone today. Refill for Metolazone today  - Continue Jardiance 10 mg daily. Refill sent to [facility]  -  Continue Entresto 97/103 mg bid - Continue eplerenone 50 mg daily (breast tenderness w/spiro). - Discussed low salt diet and limiting fluid intake.    2. Hypertension  -  Elevated but he has not had medications. Reinforced medication compliance.  - continue CPAP   3. OSA, severe - now has CPAP device, using intermittently - Discussed compliance.   - follows with Dr. Claiborne Billings  4. DM2 - Hgb A1C 6.1  - Continue Jardiance 10 mg daily.  - followed by PCP  - Will refer for GLP1RA. We have updated his phone number so hopefully he can get started. .   5. Obesity, morbid - Body mass index is 51.99 kg/m. - Discussed portion control   6. Tobacco Abuse - Former heavy smoker  Refill sent for metolazone/jardiance. He has a new phone number and I have updated. Will notify [facility].    Follow up 3 weeks with APP to reassess volume status.    Darrick Grinder, NP  9:23 AM

## 2021-11-15 ENCOUNTER — Ambulatory Visit (HOSPITAL_COMMUNITY)
Admission: RE | Admit: 2021-11-15 | Discharge: 2021-11-15 | Disposition: A | Discharge status: Home / Self Care | Payer: Self-pay | Source: Ambulatory Visit | Attending: Adult Health | Admitting: Adult Health

## 2021-11-15 ENCOUNTER — Encounter (HOSPITAL_COMMUNITY): Payer: Self-pay

## 2021-11-15 VITALS — BP 148/72 | HR 88 | Wt 327.0 lb

## 2021-11-15 DIAGNOSIS — Z6841 Body Mass Index (BMI) 40.0 and over, adult: Secondary | ICD-10-CM | POA: Insufficient documentation

## 2021-11-15 DIAGNOSIS — I10 Essential (primary) hypertension: Secondary | ICD-10-CM

## 2021-11-15 DIAGNOSIS — E119 Type 2 diabetes mellitus without complications: Secondary | ICD-10-CM | POA: Insufficient documentation

## 2021-11-15 DIAGNOSIS — I3139 Other pericardial effusion (noninflammatory): Secondary | ICD-10-CM | POA: Insufficient documentation

## 2021-11-15 DIAGNOSIS — I428 Other cardiomyopathies: Secondary | ICD-10-CM

## 2021-11-15 DIAGNOSIS — G4733 Obstructive sleep apnea (adult) (pediatric): Secondary | ICD-10-CM | POA: Insufficient documentation

## 2021-11-15 DIAGNOSIS — I5042 Chronic combined systolic (congestive) and diastolic (congestive) heart failure: Secondary | ICD-10-CM

## 2021-11-15 DIAGNOSIS — Z7984 Long term (current) use of oral hypoglycemic drugs: Secondary | ICD-10-CM | POA: Insufficient documentation

## 2021-11-15 DIAGNOSIS — Z87891 Personal history of nicotine dependence: Secondary | ICD-10-CM | POA: Insufficient documentation

## 2021-11-15 DIAGNOSIS — I110 Hypertensive heart disease with heart failure: Secondary | ICD-10-CM | POA: Insufficient documentation

## 2021-11-15 MED ORDER — TORSEMIDE 20 MG PO TABS: 60.0000 mg | ORAL_TABLET | Freq: Two times a day (BID) | ORAL | 11 refills | Status: DC

## 2021-11-15 MED ORDER — EMPAGLIFLOZIN 10 MG PO TABS: 10.0000 mg | ORAL_TABLET | Freq: Every day | ORAL | 11 refills | Status: DC

## 2021-11-15 MED ORDER — METOLAZONE 2.5 MG PO TABS: 2.5000 mg | ORAL_TABLET | ORAL | 0 refills | Status: DC

## 2021-11-15 NOTE — Patient Instructions (Signed)
No Labs done today.  RESTART Jardiance '10mg'$  (1 tablet) by mouth daily.  Take Metolazone 2.'5mg'$  (1 tablet) by mouth today. After today only take as directed by the advanced HF Clinic  INCREASE Torsemide to '60mg'$  (3 tablets) by mouth 2 times daily.   No other medication changes were made. Please continue all current medications as prescribed.  Your physician recommends that you schedule a follow-up appointment in: 2-3 weeks  If you have any questions or concerns before your next appointment please send [place] a message through [place] or call our office at [phone].    TO LEAVE A MESSAGE FOR THE NURSE SELECT OPTION 2, PLEASE LEAVE A MESSAGE INCLUDING: YOUR NAME DATE OF BIRTH CALL BACK NUMBER REASON FOR CALL**this is important as we prioritize the call backs  YOU WILL RECEIVE A CALL BACK THE SAME DAY AS LONG AS YOU CALL BEFORE 4:00 PM   Do the following things EVERYDAY: Weigh yourself in the morning before breakfast. Write it down and keep it in a log. Take your medicines as prescribed Eat low salt foods--Limit salt (sodium) to 2000 mg per day.  Stay as active as you can everyday Limit all fluids for the day to less than 2 liters   At the [facility], you and your health needs are our priority. As part of our continuing mission to provide you with exceptional heart care, we have created designated Provider Care Teams. These Care Teams include your primary Cardiologist (physician) and Advanced Practice Providers (APPs- Physician Assistants and Nurse Practitioners) who all work together to provide you with the care you need, when you need it.   You may see any of the following providers on your designated Care Team at your next follow up: Dr Glori Bickers Dr Haynes Kerns, NP Lyda Jester, [place] Audry Riles, PharmD   Please be sure to bring in all your medications bottles to every appointment.

## 2021-11-16 ENCOUNTER — Other Ambulatory Visit (HOSPITAL_COMMUNITY): Payer: Self-pay

## 2021-11-16 MED ORDER — METOLAZONE 2.5 MG PO TABS
2.5000 mg | ORAL_TABLET | ORAL | 0 refills | Status: DC
  Filled 2021-11-16: qty 3, 3d supply, fill #0

## 2021-11-16 MED ORDER — EMPAGLIFLOZIN 10 MG PO TABS
10.0000 mg | ORAL_TABLET | Freq: Every day | ORAL | 11 refills | Status: DC
  Filled 2021-11-16: qty 30, 30d supply, fill #0

## 2021-11-16 MED ORDER — SACUBITRIL-VALSARTAN 97-103 MG PO TABS
1.0000 | ORAL_TABLET | Freq: Two times a day (BID) | ORAL | 11 refills | Status: DC
  Filled 2021-11-16: qty 60, 30d supply, fill #0

## 2021-11-16 MED ORDER — EMPAGLIFLOZIN 10 MG PO TABS: 10.0000 mg | ORAL_TABLET | Freq: Every day | ORAL | 11 refills | Status: DC

## 2021-11-16 MED ORDER — SACUBITRIL-VALSARTAN 97-103 MG PO TABS: 1.0000 | ORAL_TABLET | Freq: Two times a day (BID) | ORAL | 11 refills | Status: DC

## 2021-11-16 MED ORDER — METOLAZONE 2.5 MG PO TABS: 2.5000 mg | ORAL_TABLET | ORAL | 0 refills | Status: DC

## 2021-11-19 ENCOUNTER — Other Ambulatory Visit (HOSPITAL_BASED_OUTPATIENT_CLINIC_OR_DEPARTMENT_OTHER): Payer: Self-pay

## 2021-11-19 ENCOUNTER — Other Ambulatory Visit (HOSPITAL_COMMUNITY): Payer: Self-pay

## 2021-11-20 ENCOUNTER — Other Ambulatory Visit (HOSPITAL_COMMUNITY): Payer: Self-pay

## 2021-11-26 ENCOUNTER — Other Ambulatory Visit (HOSPITAL_COMMUNITY): Payer: Self-pay

## 2021-11-26 ENCOUNTER — Other Ambulatory Visit: Payer: Self-pay

## 2021-11-28 ENCOUNTER — Other Ambulatory Visit (HOSPITAL_COMMUNITY): Payer: Self-pay

## 2021-12-07 ENCOUNTER — Ambulatory Visit (HOSPITAL_COMMUNITY)
Admission: RE | Admit: 2021-12-07 | Discharge: 2021-12-07 | Disposition: A | Discharge status: Home / Self Care | Payer: Commercial Managed Care - HMO | Source: Ambulatory Visit | Attending: Physician Assistant | Admitting: Physician Assistant

## 2021-12-07 ENCOUNTER — Other Ambulatory Visit (HOSPITAL_COMMUNITY): Payer: Self-pay

## 2021-12-07 ENCOUNTER — Encounter (HOSPITAL_COMMUNITY): Payer: Self-pay

## 2021-12-07 VITALS — BP 116/84 | HR 76 | Wt 330.2 lb

## 2021-12-07 DIAGNOSIS — I110 Hypertensive heart disease with heart failure: Secondary | ICD-10-CM | POA: Diagnosis not present

## 2021-12-07 DIAGNOSIS — Z5986 Financial insecurity: Secondary | ICD-10-CM | POA: Diagnosis not present

## 2021-12-07 DIAGNOSIS — Z79899 Other long term (current) drug therapy: Secondary | ICD-10-CM | POA: Insufficient documentation

## 2021-12-07 DIAGNOSIS — E119 Type 2 diabetes mellitus without complications: Secondary | ICD-10-CM | POA: Insufficient documentation

## 2021-12-07 DIAGNOSIS — N644 Mastodynia: Secondary | ICD-10-CM | POA: Insufficient documentation

## 2021-12-07 DIAGNOSIS — I3139 Other pericardial effusion (noninflammatory): Secondary | ICD-10-CM | POA: Diagnosis not present

## 2021-12-07 DIAGNOSIS — Z6841 Body Mass Index (BMI) 40.0 and over, adult: Secondary | ICD-10-CM | POA: Diagnosis not present

## 2021-12-07 DIAGNOSIS — Z720 Tobacco use: Secondary | ICD-10-CM

## 2021-12-07 DIAGNOSIS — G4733 Obstructive sleep apnea (adult) (pediatric): Secondary | ICD-10-CM | POA: Diagnosis not present

## 2021-12-07 DIAGNOSIS — Z7984 Long term (current) use of oral hypoglycemic drugs: Secondary | ICD-10-CM | POA: Insufficient documentation

## 2021-12-07 DIAGNOSIS — I10 Essential (primary) hypertension: Secondary | ICD-10-CM

## 2021-12-07 DIAGNOSIS — N529 Male erectile dysfunction, unspecified: Secondary | ICD-10-CM | POA: Diagnosis not present

## 2021-12-07 DIAGNOSIS — F17210 Nicotine dependence, cigarettes, uncomplicated: Secondary | ICD-10-CM | POA: Insufficient documentation

## 2021-12-07 DIAGNOSIS — I5032 Chronic diastolic (congestive) heart failure: Secondary | ICD-10-CM | POA: Diagnosis present

## 2021-12-07 LAB — BASIC METABOLIC PANEL
Anion gap: 8 (ref 5–15)
BUN: 9 mg/dL (ref 6–20)
CO2: 29 mmol/L (ref 22–32)
Calcium: 8.8 mg/dL — ABNORMAL LOW (ref 8.9–10.3)
Chloride: 104 mmol/L (ref 98–111)
Creatinine, Ser: 0.87 mg/dL (ref 0.61–1.24)
GFR, Estimated: >60 mL/min (ref >60)
Glucose, Bld: 125 mg/dL — ABNORMAL HIGH (ref 70–99)
Potassium: 3.8 mmol/L (ref 3.5–5.1)
Sodium: 141 mmol/L (ref 135–145)

## 2021-12-07 LAB — BRAIN NATRIURETIC PEPTIDE: B Natriuretic Peptide: 33.7 pg/mL (ref 0.0–100.0)

## 2021-12-07 MED ORDER — METOLAZONE 2.5 MG PO TABS: 2.5000 mg | ORAL_TABLET | ORAL | 0 refills | Status: DC

## 2021-12-07 MED ORDER — EPLERENONE 25 MG PO TABS: 25.0000 mg | ORAL_TABLET | Freq: Every day | ORAL | 3 refills | Status: DC

## 2021-12-07 MED ORDER — DAPAGLIFLOZIN PROPANEDIOL 10 MG PO TABS: 10.0000 mg | ORAL_TABLET | Freq: Every day | ORAL | 11 refills | Status: DC

## 2021-12-07 MED ORDER — SILDENAFIL CITRATE 100 MG PO TABS: 50.0000 mg | ORAL_TABLET | ORAL | 0 refills | Status: DC | PRN

## 2021-12-07 MED ORDER — POTASSIUM CHLORIDE CRYS ER 20 MEQ PO TBCR: 40.0000 meq | EXTENDED_RELEASE_TABLET | ORAL | 0 refills | Status: DC

## 2021-12-07 NOTE — Patient Instructions (Signed)
STOP Jardiance START Farxiga 10 mg one tab daily DECREASE Inspra to 25 mg one tab daily TAKE Metolazone 2.5 mg today with 40 meq of potassium  Labs today We will only contact you if something comes back abnormal or we need to make some changes. Otherwise no news is good news!  You have been referred to [place] team for specialty medication -they will be in contact with an appointment  Please wear your compression hose daily, place them on as soon as you get up in the morning and remove before you go to bed at night.  Your physician recommends that you schedule a follow-up appointment in: 3-4 weeks with the HF team pharmacy team, and in 3-4 months with Dr Haroldine Laws   Do the following things EVERYDAY: Weigh yourself in the morning before breakfast. Write it down and keep it in a log. Take your medicines as prescribed Eat low salt foods--Limit salt (sodium) to 2000 mg per day.  Stay as active as you can everyday Limit all fluids for the day to less than 2 liters  At the [facility], you and your health needs are our priority. As part of our continuing mission to provide you with exceptional heart care, we have created designated Provider Care Teams. These Care Teams include your primary Cardiologist (physician) and Advanced Practice Providers (APPs- Physician Assistants and Nurse Practitioners) who all work together to provide you with the care you need, when you need it.   You may see any of the following providers on your designated Care Team at your next follow up: Dr Glori Bickers Dr Loralie Champagne Dr. Roxana Hires, NP Lyda Jester, [place] [facility], [place] Forestine Na, NP Audry Riles, PharmD   Please be sure to bring in all your medications bottles to every appointment.

## 2021-12-07 NOTE — Progress Notes (Signed)
ADVANCED HF CLINIC NOTE   PCP: [facility] Primary Cardiologist: Dr Harrell Gave  HF Cardiologist: Dr. Haroldine Laws  HPI: [name] is a 56 y.o. with  morbid obesity, HTN, OSA, HFpEF, tobacco abuse, and DM2.   Sleep study 10/2019 AHI 80.with desaturations to 68%.   Admitted 01/30/21 with increased dyspnea in the setting of COVID. Echo EF 35-40%.  He was volume overloaded and diuresed with IV lasix.  CT negative for PE. Started on GDMT. Discharge weight 310 pounds.   R/L Cath 02/16/21 Normal cors. LVEF 45% RA = 12 RV = 44/14 PA = 40/24 (21) PCW = 21 Fick cardiac output/index = 8.0/3.3 PVR = 0.9 WU FA sat = 97% PA sat =  78%, 80% SVC sat = 79%  Follow up 2/23, markedly volume up, weight 328 lbs. Entresto & spiro started, torsemide increased to 40 bid.   Last several follow ups, out of medications and volume up. Good response with metolazone PRN.  Today he returns for HF follow up. Overall feeling fair. More SOB walking on flat ground, feels like he has fluid on board. Feels occasional palpitations at night. Wearing CPAP intermittently. Out of Jardiance and Toprol. Breast tenderness and nipple pain returned. Asking about testosterone shots, and has been struggling with ED. Trying to lose weight. No longer working. Denies CP, dizziness, or PND/Orthopnea. Appetite ok. No fever or chills. Weight at home 328 pounds. Wife helps with meds. Works odd jobs to pay bills. Has health insurance. Smoking 3-4 cigs/day.   Cardiac Testing  - Echo (10/2021): EF 55-60% RV mildly reduced. Moderate LVH.   - Echo (01/2021): EF 35-40% Moderate pericardial effusion. Severe LVH.   - Echo (2019): EF 55-60% Grade I DD Mild LVH. Trivial pericardial effusion.   Past Medical History:  Diagnosis Date   Asthma    CHF (congestive heart failure) (HCC)    Hypertension    Sleep apnea    hasn't started CPAP yet   Current Outpatient Medications  Medication Sig Dispense Refill   dapagliflozin  propanediol (FARXIGA) 10 MG TABS tablet Take 1 tablet (10 mg total) by mouth daily before breakfast. 30 tablet 11   potassium chloride SA (KLOR-CON M) 20 MEQ tablet Take 2 tablets (40 mEq total) by mouth as directed. With every dose of metolazone 10 tablet 0   sacubitril-valsartan (ENTRESTO) 97-103 MG Take 1 tablet by mouth 2 (two) times daily. 60 tablet 11   sildenafil (VIAGRA) 100 MG tablet Take 0.5 tablets (50 mg total) by mouth as needed for erectile dysfunction. 30 tablet 0   torsemide (DEMADEX) 20 MG tablet Take 3 tablets (60 mg total) by mouth 2 (two) times daily. 120 tablet 11   eplerenone (INSPRA) 25 MG tablet Take 1 tablet (25 mg total) by mouth daily. 30 tablet 3   metolazone (ZAROXOLYN) 2.5 MG tablet Take 1 tablet (2.5 mg total) by mouth as directed. 3 tablet 0   metoprolol succinate (TOPROL-XL) 50 MG 24 hr tablet Take 1 tablet (50 mg total) by mouth daily. (Patient not taking: Reported on 12/07/2021) 90 tablet 3   No current facility-administered medications for this encounter.   No Known Allergies  Social History   Socioeconomic History   Marital status: Married    Spouse name: Not on file   Number of children: 2   Years of education: 12th grade + 2 years college   Highest education level: Some college, no degree  Occupational History   Occupation: makes concrete blocks and  bricks   Occupation: Freight forwarder  Tobacco Use   Smoking status: Former    Packs/day: 0.50    Years: 25.00    Total pack years: 12.50    Types: Cigarettes    Quit date: 02/25/2021    Years since quitting: 0.7   Smokeless tobacco: Never  Vaping Use   Vaping Use: Never used  Substance and Sexual Activity   Alcohol use: Not Currently    Alcohol/week: 2.0 standard drinks of alcohol    Types: 2 Cans of beer per week    Comment: occ beer   Drug use: No   Sexual activity: Not Currently  Other Topics Concern   Not on file  Social History Narrative   Lives with his wife and daughter.   Other  child lives independently in [place].   Drinks about 3 sodas a day       Pt stated he quit smoking   Social Determinants of Health   Financial Resource Strain: High Risk (02/01/2021)   Overall Financial Resource Strain (CARDIA)    Difficulty of Paying Living Expenses: Hard  Food Insecurity: No Food Insecurity (02/01/2021)   Hunger Vital Sign    Worried About Running Out of Food in the Last Year: Never true    Ran Out of Food in the Last Year: Never true  Transportation Needs: No Transportation Needs (02/01/2021)   PRAPARE - Hydrologist (Medical): No    Lack of Transportation (Non-Medical): No  Physical Activity: Not on file  Stress: Not on file  Social Connections: Not on file  Intimate Partner Violence: Not on file   Family History  Problem Relation Age of Onset   Heart disease Mother    Diabetes Mother    Asthma Mother    Heart disease Father    Asthma Sister    Heart disease Brother    Heart attack Brother    Colon cancer Neg Hx    Esophageal cancer Neg Hx    Rectal cancer Neg Hx    Stomach cancer Neg Hx    BP 116/84   Pulse 76   Wt (!) 149.8 kg (330 lb 3.2 oz)   SpO2 98%   BMI 52.50 kg/m   Wt Readings from Last 3 Encounters:  12/07/21 (!) 149.8 kg (330 lb 3.2 oz)  11/15/21 (!) 148.3 kg (327 lb)  11/01/21 (!) 147.1 kg (324 lb 3.2 oz)   PHYSICAL EXAM: General:  NAD. No resp difficulty, walked into clinic HEENT: Normal Neck: Supple. No JVD, thick neck. Carotids 2+ bilat; no bruits. No lymphadenopathy or thryomegaly appreciated. Cor: PMI nondisplaced. Regular rate & rhythm. No rubs, gallops or murmurs. Lungs: Clear Abdomen: Obese, soft, nontender, nondistended. No hepatosplenomegaly. No bruits or masses. Good bowel sounds. Extremities: No cyanosis, clubbing, rash, 2+ BLE pre-tibial edema Neuro: Alert & oriented x 3, cranial nerves grossly intact. Moves all 4 extremities w/o difficulty. Affect pleasant.  ASSESSMENT & PLAN: 1. Chronic  HFiEF - Echo (1/23): 35-40% LVH mod pericardial effusion. EF previously 55% in 2019.  - Cath (1/23): Normal cors. EF 45% (suspect HTN in nature). Mildly elevated pressures with high CO - Echo (10/23): EF improved 55-60%. RV mildly reduced. Discussed ECHO results during OV.  - NYHA II- early III. Volume up today. - Take metolazone 2.5 mg/40 KCL with afternoon dose of torsemide. - Continue torsemide 60 mg bid. - Stop Jardiance and start Farxiga 10 mg daily (insurance preference). Co-pay card given. - Continue Delene Loll  97/103 mg bid. - Decrease Inspra to 25 mg daily to see if breast tenderness improves. - Add Toprol back next visit when volume improved. - Place compression hose. - BNP and BMET today, repeat BMET at follow up.   2. Hypertension  - Controlled. Reinforced medication compliance.  - GDMT as above. - Continue CPAP   3. OSA, severe - Now has CPAP device, using intermittently - Discussed compliance.   - Follows with Dr. Claiborne Billings  4. DM2 - Hgb A1C 6.1  - Continue SGLT2i. - followed by PCP   5. Obesity, morbid - Body mass index is 52.5 kg/m. - Will refer for GLP1RA, tirzepatide or semaglutide.  We have updated his phone number so hopefully he can get started.   6. Tobacco Abuse - Former heavy smoker - Cut back to 3-4 cigs/day. - Discussed cessation.  7. ED - Try sildenafil prn. Will give 1 month Rx, further refills need to come from PCP  Follow up in 3 weeks with PharmD (add back Toprol, +/- stopping inspra if breast pain not improved) and 3 months with Dr. Haroldine Laws.  Allena Katz, FNP-BC 12/07/21

## 2021-12-17 ENCOUNTER — Telehealth (HOSPITAL_COMMUNITY): Payer: Self-pay | Admitting: Cardiology

## 2021-12-17 DIAGNOSIS — I5022 Chronic systolic (congestive) heart failure: Secondary | ICD-10-CM

## 2021-12-17 NOTE — Telephone Encounter (Signed)
ORDER CORRECTION

## 2021-12-24 NOTE — Progress Notes (Signed)
Advanced Heart Failure Clinic Note   PCP: [facility] Primary Cardiologist: Dr Harrell Gave  HF Cardiologist: Dr. Haroldine Laws  HPI:  [name] is a 56 y.o. with  morbid obesity, HTN, OSA, HFiEF, tobacco abuse, and DM2.    Sleep study 10/2019 AHI 80.with desaturations to 68%.    Admitted 01/30/21 with increased dyspnea in the setting of COVID. Echo EF 35-40%.  He was volume overloaded and diuresed with IV lasix.  CT negative for PE. Started on GDMT. Discharge weight 310 pounds.   R/L Cath 02/16/21 Normal cors. LVEF 45% RA = 12 RV = 44/14 PA = 40/24 (21) PCW = 21 Fick cardiac output/index = 8.0/3.3 PVR = 0.9 WU FA sat = 97% PA sat =  78%, 80% SVC sat = 79%   Follow up 02/2021, markedly volume up, weight 328 lbs. Entresto & spironolactone started, torsemide increased to 40 mg BID.    Last several follow ups, out of medications and volume up. Good response with metolazone PRN.   Returned to [facility] for HF follow up 12/07/21. Overall was feeling fair. More SOB walking on flat ground, felt like he had fluid on board. Noted occasional palpitations at night. Wearing CPAP intermittently. Out of Jardiance and metoprolol. Breast tenderness and nipple pain returned. Asked about testosterone shots, and had been struggling with ED. Reported trying to lose weight. No longer working. Denied CP, dizziness, or PND/Orthopnea. Appetite was ok. No fever or chills. Weight at home was 328 pounds. Wife had been helping with meds. Worked odd jobs to pay bills. Has health insurance. Smoking 3-4 cigs/day.  Today he returns to HF clinic for pharmacist medication titration. At last visit with APP, Vania Rea was changed to [place] due to insurance preference. Eplerenone was decreased to 25 mg daily to see if breast tenderness improved. Overall feeling ok today. Main complaints are back pain and constipation. Has been out of all medications except Farxiga and potassium for 1 week. Has refills on all  medications, his wife called them in to the pharmacy during the clinic visit. Over the last week, notes he gets SOB quicker with mild activity. This coincides with being out of most of his medications, including torsemide. Stated weight at home was 318 lbs when he last checked but has not been weighing daily. Weight in clinic today is 336 lbs, up 6 lbs from last visit. No PND or orthopnea. Has 1+ bilateral LEE, has been wearing compression stockings. Has not been following a low salt diet. Notes breast pain/tenderness initially improved after decreasing dose of eplerenone but that nipple pain has worsened over the last week when he was out of medications.    HF Medications: Entresto 97/103 mg BID Eplerenone 25 mg daily Farxiga 10 mg daily Torsemide 60 mg BID Metolazone 2.5 mg PRN    Understanding of regimen: poor Understanding of indications: fair Potential of compliance: fair - his wife helps him Patient understands to avoid NSAIDs. Patient understands to avoid decongestants.    Pertinent Lab Values: 12/07/21: Serum creatinine 0.87, BUN 9, Potassium 3.8, Sodium 141, BNP 33.7  Vital Signs: Weight: 336.4 lbs (last clinic weight: 330.2 lbs) Blood pressure: 156/96  Heart rate: 92   Assessment/Plan: 1. Chronic HFiEF - Echo (01/2021): 35-40% LVH mod pericardial effusion. EF previously 55% in 2019.  - Cath (01/2021): Normal cors. EF 45% (suspect HTN in nature). Mildly elevated pressures with high CO - Echo (10/2021): EF improved 55-60%. RV mildly reduced.  - NYHA II- early III. Volume  up today. He has been out of all medications except [name] for 1 week. His wife called in refills of all medications to his pharmacy during patient visit. He will pick them up today.  - Restart torsemide 60 mg BID. Take one dose of metolazone 2.5 mg today.  - Restart Entresto 97/103 mg BID. - Restart Farxiga 10 mg daily (insurance preference).  - Restart Inspra 25 mg daily. He would like to continue for now  despite ongoing breast tenderness. Will reassess at next visit.  - Will not restart metoprolol succinate as volume status is elevated today.  - Reinforced importance of medication compliance, daily weights and low salt diet. Continue to use compression hose.   2. Hypertension  - Uncontrolled, BP 156/96 in clinic after being out of most of his medications for 1 week. Reinforced medication compliance.  - Restart GDMT as above. - Continue CPAP    3. OSA, severe - Now has CPAP device, using intermittently - Discussed compliance.   - Follows with Dr. Claiborne Billings   4. DM2 - Hgb A1C 6.1  - Continue SGLT2i. - followed by PCP    5. Obesity, morbid - Body mass index is 52.5 kg/m. - Has been referred for [facility] but too expensive. He gets new insurance in January. Per pharmacy clinic notes, will reassess cost of semaglutide at that time.     6. Tobacco Abuse - Former heavy smoker - Cut back to 3-4 cigs/day. - Discussed cessation.   7. ED - On sildenafil prn. Refills from PCP.   Follow up 1 month with [facility].    Audry Riles, PharmD, BCPS, BCCP, CPP Heart Failure Clinic Pharmacist [phone]

## 2021-12-31 ENCOUNTER — Telehealth: Payer: Self-pay | Admitting: Pharmacist

## 2021-12-31 MED ORDER — WEGOVY 0.25 MG/0.5ML ~~LOC~~ SOAJ: 0.2500 mg | SUBCUTANEOUS | 0 refills | Status: DC

## 2021-12-31 NOTE — Telephone Encounter (Signed)
[place] copay $1,618 with his insurance. Copay card only takes a few hundred off so cost would still be > $1,000 per month even with insurance.  Left message for pt to make him aware.

## 2021-12-31 NOTE — Telephone Encounter (Signed)
Pt referred to PharmD for [place] earlier this year, deferred due to national shortage of [place]. Still on backorder but looked into coverage to see if med will be an option for pt. Mancel Parsons is on pt's formulary. Rx sent to pharmacy to determine copay.

## 2022-01-02 NOTE — Telephone Encounter (Signed)
Called pt again, he is aware. He plans to change insurance plans to [employer] in January. Will try resubmitting for Wegovy coverage at that time to see if his new insurance plan covers the med better. He was appreciative for the call.

## 2022-01-07 ENCOUNTER — Ambulatory Visit (HOSPITAL_COMMUNITY)
Admission: RE | Admit: 2022-01-07 | Discharge: 2022-01-07 | Disposition: A | Discharge status: Home / Self Care | Payer: Commercial Managed Care - HMO | Source: Ambulatory Visit | Attending: Cardiology | Admitting: Cardiology

## 2022-01-07 VITALS — BP 156/96 | HR 92 | Wt 336.4 lb

## 2022-01-07 DIAGNOSIS — I428 Other cardiomyopathies: Secondary | ICD-10-CM

## 2022-01-07 DIAGNOSIS — Z720 Tobacco use: Secondary | ICD-10-CM | POA: Insufficient documentation

## 2022-01-07 DIAGNOSIS — I3139 Other pericardial effusion (noninflammatory): Secondary | ICD-10-CM | POA: Insufficient documentation

## 2022-01-07 DIAGNOSIS — G4733 Obstructive sleep apnea (adult) (pediatric): Secondary | ICD-10-CM | POA: Diagnosis not present

## 2022-01-07 DIAGNOSIS — E119 Type 2 diabetes mellitus without complications: Secondary | ICD-10-CM | POA: Insufficient documentation

## 2022-01-07 DIAGNOSIS — Z6841 Body Mass Index (BMI) 40.0 and over, adult: Secondary | ICD-10-CM | POA: Diagnosis not present

## 2022-01-07 DIAGNOSIS — I5042 Chronic combined systolic (congestive) and diastolic (congestive) heart failure: Secondary | ICD-10-CM

## 2022-01-07 DIAGNOSIS — I110 Hypertensive heart disease with heart failure: Secondary | ICD-10-CM | POA: Diagnosis not present

## 2022-01-07 DIAGNOSIS — I5022 Chronic systolic (congestive) heart failure: Secondary | ICD-10-CM | POA: Insufficient documentation

## 2022-01-07 MED ORDER — METOLAZONE 2.5 MG PO TABS: 2.5000 mg | ORAL_TABLET | Freq: Every day | ORAL | 0 refills | Status: DC | PRN

## 2022-01-07 MED ORDER — SACUBITRIL-VALSARTAN 97-103 MG PO TABS: 1.0000 | ORAL_TABLET | Freq: Two times a day (BID) | ORAL | 11 refills | Status: DC

## 2022-01-07 MED ORDER — TORSEMIDE 20 MG PO TABS: 60.0000 mg | ORAL_TABLET | Freq: Two times a day (BID) | ORAL | 11 refills | Status: DC

## 2022-01-07 MED ORDER — POTASSIUM CHLORIDE CRYS ER 20 MEQ PO TBCR: 20.0000 meq | EXTENDED_RELEASE_TABLET | Freq: Two times a day (BID) | ORAL | 2 refills | Status: DC

## 2022-01-07 NOTE — Patient Instructions (Addendum)
It was a pleasure seeing you today!  MEDICATIONS: -We are changing your medications today -Restart medications. Do not restart metoprolol -Take metolazone 2.5 mg (1 tablet) tonight for one dose to help with your fluid level.  -Call if you have questions about your medications.   NEXT APPOINTMENT: Return to clinic in 1 month with [facility] and 3 months with Dr. Haroldine Laws.  In general, to take care of your heart failure: -Limit your fluid intake to 2 Liters (half-gallon) per day.   -Limit your salt intake to ideally 2-3 grams (2000-3000 mg) per day. -Weigh yourself daily and record, and bring that "weight diary" to your next appointment.  (Weight gain of 2-3 pounds in 1 day typically means fluid weight.) -The medications for your heart are to help your heart and help you live longer.   -Please contact us before stopping any of your heart medications.  Call the clinic at [phone] with questions or to reschedule future appointments.

## 2022-02-04 ENCOUNTER — Telehealth: Payer: Self-pay | Admitting: Pharmacist

## 2022-02-04 NOTE — Telephone Encounter (Signed)
Pt previously referred to PharmD for GLP for weight loss.  [employer] insurance last year didn't cover any of the [facility] copay so cost was >$1500 per month.   Pt stated he was changing to [employer] this year with plan to see if his new insurance will cover [facility] better.  Left message for pt to see if he changed to [facility] and to get insurance info so I can submit PA.

## 2022-02-04 NOTE — Progress Notes (Signed)
ADVANCED HF CLINIC NOTE   PCP: Vevelyn Francois, NP Primary Cardiologist: Dr Harrell Gave  HF Cardiologist: Dr. Haroldine Laws  HPI: Randy Morgan is a 57 y.o. with morbid obesity, HTN, OSA, HFpEF, tobacco abuse, and DM2.   Sleep study 10/2019 AHI 80.with desaturations to 68%.   Admitted 01/30/21 with increased dyspnea in the setting of COVID. Echo EF 35-40%.  He was volume overloaded and diuresed with IV lasix.  CT negative for PE. Started on GDMT. Discharge weight 310 pounds.   R/L Cath 02/16/21 Normal cors. LVEF 45% RA = 12 RV = 44/14 PA = 40/24 (21) PCW = 21 Fick cardiac output/index = 8.0/3.3 PVR = 0.9 WU FA sat = 97% PA sat =  78%, 80% SVC sat = 79%  Follow up 2/23, markedly volume up, weight 328 lbs. Entresto & spiro started, torsemide increased to 40 bid.   Last several follow ups, out of medications and volume up. Good response with metolazone PRN.  Today he returns for HF follow up. Overall feeling fine. He has SOB walking on flat ground and up steps. Feels palps occasionally. Tries to wear CPAP 3-4 hrs/night. Out of torsemide x 1 month, says insurance does not cover it and could not afford the cost. Denies abnormal bleeding, CP, dizziness, edema, or PND/Orthopnea. Appetite ok. No fever or chills. Weight at home 328 pounds. Wife helps with meds. BP at home 130/98. Stopped cigarettes 11/2021. Occasional ETOH. Not working   Cardiac Testing  - Echo (10/2021): EF 55-60% RV mildly reduced. Moderate LVH.   - Echo (01/2021): EF 35-40% Moderate pericardial effusion. Severe LVH.   - Echo (2019): EF 55-60% Grade I DD Mild LVH. Trivial pericardial effusion.   Past Medical History:  Diagnosis Date   Asthma    CHF (congestive heart failure) (HCC)    Hypertension    Sleep apnea    hasn't started CPAP yet   Current Outpatient Medications  Medication Sig Dispense Refill   dapagliflozin propanediol (FARXIGA) 10 MG TABS tablet Take 1 tablet (10 mg total) by mouth daily before  breakfast. 30 tablet 11   eplerenone (INSPRA) 25 MG tablet Take 1 tablet (25 mg total) by mouth daily. 30 tablet 3   metolazone (ZAROXOLYN) 2.5 MG tablet Take 1 tablet (2.5 mg total) by mouth daily as needed. Take as needed for fluid retention when directed by HF clinic 3 tablet 0   potassium chloride SA (KLOR-CON M) 20 MEQ tablet Take 1 tablet (20 mEq total) by mouth 2 (two) times daily. 60 tablet 2   sacubitril-valsartan (ENTRESTO) 97-103 MG Take 1 tablet by mouth 2 (two) times daily. 60 tablet 11   sildenafil (VIAGRA) 100 MG tablet Take 0.5 tablets (50 mg total) by mouth as needed for erectile dysfunction. 30 tablet 0   torsemide (DEMADEX) 20 MG tablet Take 3 tablets (60 mg total) by mouth 2 (two) times daily. 120 tablet 11   No current facility-administered medications for this encounter.   Allergies  Allergen Reactions   Spironolactone Other (See Comments)    Breast pain/tenderness   Social History   Socioeconomic History   Marital status: Married    Spouse name: Not on file   Number of children: 2   Years of education: 12th grade + 2 years college   Highest education level: Some college, no degree  Occupational History   Occupation: makes concrete blocks and bricks   Occupation: Freight forwarder  Tobacco Use   Smoking status: Former  Packs/day: 0.50    Years: 25.00    Total pack years: 12.50    Types: Cigarettes    Quit date: 02/25/2021    Years since quitting: 0.9   Smokeless tobacco: Never  Vaping Use   Vaping Use: Never used  Substance and Sexual Activity   Alcohol use: Not Currently    Alcohol/week: 2.0 standard drinks of alcohol    Types: 2 Cans of beer per week    Comment: occ beer   Drug use: No   Sexual activity: Not Currently  Other Topics Concern   Not on file  Social History Narrative   Lives with his wife and daughter.   Other child lives independently in [place].   Drinks about 3 sodas a day       Pt stated he quit smoking   Social  Determinants of Health   Financial Resource Strain: High Risk (02/01/2021)   Overall Financial Resource Strain (CARDIA)    Difficulty of Paying Living Expenses: Hard  Food Insecurity: No Food Insecurity (02/01/2021)   Hunger Vital Sign    Worried About Running Out of Food in the Last Year: Never true    Ran Out of Food in the Last Year: Never true  Transportation Needs: No Transportation Needs (02/01/2021)   PRAPARE - Hydrologist (Medical): No    Lack of Transportation (Non-Medical): No  Physical Activity: Not on file  Stress: Not on file  Social Connections: Not on file  Intimate Partner Violence: Not on file   Family History  Problem Relation Age of Onset   Heart disease Mother    Diabetes Mother    Asthma Mother    Heart disease Father    Asthma Sister    Heart disease Brother    Heart attack Brother    Colon cancer Neg Hx    Esophageal cancer Neg Hx    Rectal cancer Neg Hx    Stomach cancer Neg Hx    BP (!) 160/110   Pulse 100   Wt (!) 152.1 kg (335 lb 6.4 oz)   SpO2 94%   BMI 53.32 kg/m   Wt Readings from Last 3 Encounters:  02/05/22 (!) 152.1 kg (335 lb 6.4 oz)  01/07/22 (!) 152.6 kg (336 lb 6.4 oz)  12/07/21 (!) 149.8 kg (330 lb 3.2 oz)   PHYSICAL EXAM: General:  NAD. No resp difficulty, walked into clinic. HEENT: Normal Neck: Supple. No JVD, thick neck. Carotids 2+ bilat; no bruits. No lymphadenopathy or thryomegaly appreciated. Cor: PMI nondisplaced. Regular rate & rhythm. No rubs, gallops or murmurs. Lungs: Clear Abdomen: Obese, nontender, nondistended. No hepatosplenomegaly. No bruits or masses. Good bowel sounds. Extremities: No cyanosis, clubbing, rash, 2+ BLE pre-tibial edema Neuro: Alert & oriented x 3, cranial nerves grossly intact. Moves all 4 extremities w/o difficulty. Affect pleasant.  ECG (personally reviewed): NSR 95 bpm  ASSESSMENT & PLAN: 1. Chronic HFiEF - Echo (1/23): 35-40% LVH mod pericardial effusion. EF  previously 55% in 2019.  - Cath (1/23): Normal cors. EF 45% (suspect HTN in nature). Mildly elevated pressures with high CO - Echo (10/23): EF improved 55-60%. RV mildly reduced.   - NYHA early III-III, functional class confounded by body habitus. Volume up today. - Take metolazone 2.5 mg + extra 40 KCL with morning dose of torsemide. - Restart torsemide 60 mg bid. Keep KCL 40 daily. $4 at his pharmacy for torsemide. - Continue Farxiga 10 mg daily. No GU symptoms. - Continue  Entresto 97/103 mg bid. - Continue eplerenone 25 mg daily (breast pain with spiro) - Add Toprol back next visit when volume improved. - He needs to wear his compression hose. - BNP and BMET today.   2. Hypertension  - Elevated, has not had AM meds yet. Has BP cuff at home. - Reinforced medication compliance.  - GDMT as above. - Continue CPAP   3. OSA, severe - Now has CPAP device, using about 3-4 hrs/night - Discussed compliance.   - Follows with Dr. Claiborne Billings  4. DM2 - Hgb A1C 6.1  - Continue SGLT2i. - Followed by PCP   5. Obesity, morbid - Body mass index is 53.32 kg/m. - [employer] won't cover [id number], planning to change to [facility] later this year. Hopefully will cover one of the injectable GLPs.  6. Tobacco Abuse - Former heavy smoker - Now quit. Congratulated.  Follow up in 2 weeks with APP to re-check fluid and for med check. Keep follow up in 2 months with Dr. Haroldine Laws, as scheduled.  Randy Katz, FNP-BC 02/05/22

## 2022-02-05 ENCOUNTER — Encounter (HOSPITAL_COMMUNITY): Payer: Self-pay

## 2022-02-05 ENCOUNTER — Ambulatory Visit (HOSPITAL_COMMUNITY)
Admission: RE | Admit: 2022-02-05 | Discharge: 2022-02-05 | Disposition: A | Discharge status: Home / Self Care | Payer: Medicaid Other | Source: Ambulatory Visit | Attending: Family Medicine | Admitting: Family Medicine

## 2022-02-05 ENCOUNTER — Other Ambulatory Visit (HOSPITAL_COMMUNITY): Payer: Self-pay

## 2022-02-05 VITALS — BP 160/110 | HR 100 | Wt 335.4 lb

## 2022-02-05 DIAGNOSIS — N644 Mastodynia: Secondary | ICD-10-CM | POA: Insufficient documentation

## 2022-02-05 DIAGNOSIS — Z79899 Other long term (current) drug therapy: Secondary | ICD-10-CM | POA: Insufficient documentation

## 2022-02-05 DIAGNOSIS — I5032 Chronic diastolic (congestive) heart failure: Secondary | ICD-10-CM

## 2022-02-05 DIAGNOSIS — Z720 Tobacco use: Secondary | ICD-10-CM

## 2022-02-05 DIAGNOSIS — Z87891 Personal history of nicotine dependence: Secondary | ICD-10-CM | POA: Insufficient documentation

## 2022-02-05 DIAGNOSIS — I5022 Chronic systolic (congestive) heart failure: Secondary | ICD-10-CM | POA: Diagnosis not present

## 2022-02-05 DIAGNOSIS — E119 Type 2 diabetes mellitus without complications: Secondary | ICD-10-CM | POA: Diagnosis not present

## 2022-02-05 DIAGNOSIS — Z6841 Body Mass Index (BMI) 40.0 and over, adult: Secondary | ICD-10-CM | POA: Insufficient documentation

## 2022-02-05 DIAGNOSIS — I110 Hypertensive heart disease with heart failure: Secondary | ICD-10-CM | POA: Insufficient documentation

## 2022-02-05 DIAGNOSIS — I3139 Other pericardial effusion (noninflammatory): Secondary | ICD-10-CM | POA: Diagnosis not present

## 2022-02-05 DIAGNOSIS — I10 Essential (primary) hypertension: Secondary | ICD-10-CM | POA: Diagnosis not present

## 2022-02-05 DIAGNOSIS — G4733 Obstructive sleep apnea (adult) (pediatric): Secondary | ICD-10-CM | POA: Diagnosis not present

## 2022-02-05 LAB — BASIC METABOLIC PANEL
Anion gap: 10 (ref 5–15)
BUN: 15 mg/dL (ref 6–20)
CO2: 28 mmol/L (ref 22–32)
Calcium: 8.8 mg/dL — ABNORMAL LOW (ref 8.9–10.3)
Chloride: 101 mmol/L (ref 98–111)
Creatinine, Ser: 0.96 mg/dL (ref 0.61–1.24)
GFR, Estimated: >60 mL/min (ref >60)
Glucose, Bld: 159 mg/dL — ABNORMAL HIGH (ref 70–99)
Potassium: 3.2 mmol/L — ABNORMAL LOW (ref 3.5–5.1)
Sodium: 139 mmol/L (ref 135–145)

## 2022-02-05 LAB — BRAIN NATRIURETIC PEPTIDE: B Natriuretic Peptide: 17.1 pg/mL (ref 0.0–100.0)

## 2022-02-05 NOTE — Patient Instructions (Addendum)
Medication Changes:  RESTART torsemide '60mg'$  BID. Take 3 ('20mg'$  tablets), two times a day. (With your new insurance your prescription is $4)   TAKE metolazone 2.5 mg ONCE + extra 54mq potassium with your torsemide today!   Lab Work:  Labs done today, your results will be available in MyChart, we will contact you for abnormal readings.  Special Instructions // Education:  Do the following things EVERYDAY: Weigh yourself in the morning before breakfast. Write it down and keep it in a log. Take your medicines as prescribed Eat low salt foods--Limit salt (sodium) to 2000 mg per day.  Stay as active as you can everyday Limit all fluids for the day to less than 2 liters  Follow-Up in: 2 weeks with APP.  Keep follow up with Dr. BHaroldine Lawstoo.      At [facility] you and your health needs are our priority. We have a designated team specialized in the treatment of Heart Failure. This Care Team includes your primary Heart Failure Specialized Cardiologist (physician), Advanced Practice Providers (APPs- Physician Assistants and Nurse Practitioners), and Pharmacist who all work together to provide you with the care you need, when you need it.   You may see any of the following providers on your designated Care Team at your next follow up:  Dr. DGlori BickersDr. DLoralie ChampagneDr. ARoxana Hires NP BLyda Jester PUtahJLiberty Eye Surgical Center LLCLWingo PUtahAForestine Na NP LAudry Riles PharmD   Please be sure to bring in all your medications bottles to every appointment.   Need to Contact [place]  If you have any questions or concerns before your next appointment please send [place] message through mWest[place]or call our office at [phone]    TO LEAVE A MESSAGE FOR THE NURSE SELECT OPTION 2, PLEASE LEAVE A MESSAGE INCLUDING: YOUR NAME DATE OF BIRTH CALL BACK NUMBER REASON FOR CALL**this is important as we prioritize the call backs  YOU WILL RECEIVE A  CALL BACK THE SAME DAY AS LONG AS YOU CALL BEFORE 4:00 PM

## 2022-02-06 ENCOUNTER — Telehealth (HOSPITAL_COMMUNITY): Payer: Self-pay

## 2022-02-06 NOTE — Telephone Encounter (Signed)
Patient aware and agreeable. 

## 2022-02-11 NOTE — Telephone Encounter (Signed)
2nd message left for pt.

## 2022-02-19 ENCOUNTER — Encounter (HOSPITAL_COMMUNITY): Payer: Commercial Managed Care - HMO

## 2022-02-19 NOTE — Progress Notes (Incomplete)
ADVANCED HF CLINIC NOTE   PCP: Vevelyn Francois, NP Primary Cardiologist: Dr Harrell Gave  HF Cardiologist: Dr. Haroldine Laws  HPI: Randy Morgan is a 57 y.o. with morbid obesity, HTN, OSA, HFpEF, tobacco abuse, and DM2.   Sleep study 10/2019 AHI 80.with desaturations to 68%.   Admitted 01/30/21 with increased dyspnea in the setting of COVID. Echo EF 35-40%.  He was volume overloaded and diuresed with IV lasix.  CT negative for PE. Started on GDMT. Discharge weight 310 pounds.   R/L Cath 02/16/21 Normal cors. LVEF 45% RA = 12 RV = 44/14 PA = 40/24 (21) PCW = 21 Fick cardiac output/index = 8.0/3.3 PVR = 0.9 WU FA sat = 97% PA sat =  78%, 80% SVC sat = 79%  Follow up 2/23, markedly volume up, weight 328 lbs. Entresto & spiro started, torsemide increased to 40 bid.   Last several follow ups, out of medications and volume up. Good response with metolazone PRN.  Today he returns for HF follow up. Overall feeling fine. He has SOB walking on flat ground and up steps. Feels palps occasionally. Tries to wear CPAP 3-4 hrs/night. Out of torsemide x 1 month, says insurance does not cover it and could not afford the cost. Denies abnormal bleeding, CP, dizziness, edema, or PND/Orthopnea. Appetite ok. No fever or chills. Weight at home 328 pounds. Wife helps with meds. BP at home 130/98. Stopped cigarettes 11/2021. Occasional ETOH. Not working   Cardiac Testing  - Echo (10/2021): EF 55-60% RV mildly reduced. Moderate LVH.   - Echo (01/2021): EF 35-40% Moderate pericardial effusion. Severe LVH.   - Echo (2019): EF 55-60% Grade I DD Mild LVH. Trivial pericardial effusion.   Past Medical History:  Diagnosis Date   Asthma    CHF (congestive heart failure) (HCC)    Hypertension    Sleep apnea    hasn't started CPAP yet   Current Outpatient Medications  Medication Sig Dispense Refill   dapagliflozin propanediol (FARXIGA) 10 MG TABS tablet Take 1 tablet (10 mg total) by mouth daily before  breakfast. 30 tablet 11   eplerenone (INSPRA) 25 MG tablet Take 1 tablet (25 mg total) by mouth daily. 30 tablet 3   metolazone (ZAROXOLYN) 2.5 MG tablet Take 1 tablet (2.5 mg total) by mouth daily as needed. Take as needed for fluid retention when directed by HF clinic 3 tablet 0   potassium chloride SA (KLOR-CON M) 20 MEQ tablet Take 1 tablet (20 mEq total) by mouth 2 (two) times daily. 60 tablet 2   sacubitril-valsartan (ENTRESTO) 97-103 MG Take 1 tablet by mouth 2 (two) times daily. 60 tablet 11   sildenafil (VIAGRA) 100 MG tablet Take 0.5 tablets (50 mg total) by mouth as needed for erectile dysfunction. 30 tablet 0   torsemide (DEMADEX) 20 MG tablet Take 3 tablets (60 mg total) by mouth 2 (two) times daily. 120 tablet 11   No current facility-administered medications for this visit.   Allergies  Allergen Reactions   Spironolactone Other (See Comments)    Breast pain/tenderness   Social History   Socioeconomic History   Marital status: Married    Spouse name: Not on file   Number of children: 2   Years of education: 12th grade + 2 years college   Highest education level: Some college, no degree  Occupational History   Occupation: makes concrete blocks and bricks   Occupation: Freight forwarder  Tobacco Use   Smoking status: Former  Packs/day: 0.50    Years: 25.00    Total pack years: 12.50    Types: Cigarettes    Quit date: 02/25/2021    Years since quitting: 0.9   Smokeless tobacco: Never  Vaping Use   Vaping Use: Never used  Substance and Sexual Activity   Alcohol use: Not Currently    Alcohol/week: 2.0 standard drinks of alcohol    Types: 2 Cans of beer per week    Comment: occ beer   Drug use: No   Sexual activity: Not Currently  Other Topics Concern   Not on file  Social History Narrative   Lives with his wife and daughter.   Other child lives independently in [place].   Drinks about 3 sodas a day       Pt stated he quit smoking   Social Determinants of  Health   Financial Resource Strain: High Risk (02/01/2021)   Overall Financial Resource Strain (CARDIA)    Difficulty of Paying Living Expenses: Hard  Food Insecurity: No Food Insecurity (02/01/2021)   Hunger Vital Sign    Worried About Running Out of Food in the Last Year: Never true    Ran Out of Food in the Last Year: Never true  Transportation Needs: No Transportation Needs (02/01/2021)   PRAPARE - Hydrologist (Medical): No    Lack of Transportation (Non-Medical): No  Physical Activity: Not on file  Stress: Not on file  Social Connections: Not on file  Intimate Partner Violence: Not on file   Family History  Problem Relation Age of Onset   Heart disease Mother    Diabetes Mother    Asthma Mother    Heart disease Father    Asthma Sister    Heart disease Brother    Heart attack Brother    Colon cancer Neg Hx    Esophageal cancer Neg Hx    Rectal cancer Neg Hx    Stomach cancer Neg Hx    There were no vitals taken for this visit.  Wt Readings from Last 3 Encounters:  02/05/22 (!) 152.1 kg (335 lb 6.4 oz)  01/07/22 (!) 152.6 kg (336 lb 6.4 oz)  12/07/21 (!) 149.8 kg (330 lb 3.2 oz)   PHYSICAL EXAM: General:  NAD. No resp difficulty, walked into clinic. HEENT: Normal Neck: Supple. No JVD, thick neck. Carotids 2+ bilat; no bruits. No lymphadenopathy or thryomegaly appreciated. Cor: PMI nondisplaced. Regular rate & rhythm. No rubs, gallops or murmurs. Lungs: Clear Abdomen: Obese, nontender, nondistended. No hepatosplenomegaly. No bruits or masses. Good bowel sounds. Extremities: No cyanosis, clubbing, rash, 2+ BLE pre-tibial edema Neuro: Alert & oriented x 3, cranial nerves grossly intact. Moves all 4 extremities w/o difficulty. Affect pleasant.  ECG (personally reviewed): NSR 95 bpm  ASSESSMENT & PLAN: 1. Chronic HFiEF - Echo (1/23): 35-40% LVH mod pericardial effusion. EF previously 55% in 2019.  - Cath (1/23): Normal cors. EF 45% (suspect  HTN in nature). Mildly elevated pressures with high CO - Echo (10/23): EF improved 55-60%. RV mildly reduced.   - NYHA early III-III, functional class confounded by body habitus. Volume up today. - Take metolazone 2.5 mg + extra 40 KCL with morning dose of torsemide. - Restart torsemide 60 mg bid. Keep KCL 40 daily. $4 at his pharmacy for torsemide. - Continue Farxiga 10 mg daily. No GU symptoms. - Continue Entresto 97/103 mg bid. - Continue eplerenone 25 mg daily (breast pain with spiro) - Add Toprol back next  visit when volume improved. - He needs to wear his compression hose. - BNP and BMET today.   2. Hypertension  - Elevated, has not had AM meds yet. Has BP cuff at home. - Reinforced medication compliance.  - GDMT as above. - Continue CPAP   3. OSA, severe - Now has CPAP device, using about 3-4 hrs/night - Discussed compliance.   - Follows with Dr. Claiborne Billings  4. DM2 - Hgb A1C 6.1  - Continue SGLT2i. - Followed by PCP   5. Obesity, morbid - There is no height or weight on file to calculate BMI. - [employer] won't cover [phone], planning to change to [facility] later this year. Hopefully will cover one of the injectable GLPs.  6. Tobacco Abuse - Former heavy smoker - Now quit. Congratulated.  Follow up in 2 weeks with APP to re-check fluid and for med check. Keep follow up in 2 months with Dr. Haroldine Laws, as scheduled.  Randy Katz, FNP-BC 02/19/22

## 2022-02-20 MED ORDER — SEMAGLUTIDE(0.25 OR 0.5MG/DOS) 2 MG/3ML ~~LOC~~ SOPN: PEN_INJECTOR | SUBCUTANEOUS | 1 refills | Status: DC

## 2022-02-20 NOTE — Telephone Encounter (Signed)
Ozempic prior auth approved through 02/20/23. Rx sent to pharmacy, pt aware to pick up and store in [place]. PharmD appt scheduled next week.

## 2022-02-20 NOTE — Telephone Encounter (Signed)
Called pt again. Reports he has Medicaid insurance this year. Also reports diagnosis of T2DM that his PCP follows him for. Currently on Farxiga (also has CHF). Most recent A1c 6.1% on SGLT2i. He is interested in trying Ozempic to help with weight loss. Will submit prior authorization and follow up with pt once determination is made. Key: [id number]

## 2022-03-01 ENCOUNTER — Ambulatory Visit: Payer: Medicaid Other | Attending: Interventional Cardiology | Admitting: Pharmacist

## 2022-03-01 VITALS — Wt 339.0 lb

## 2022-03-01 DIAGNOSIS — E118 Type 2 diabetes mellitus with unspecified complications: Secondary | ICD-10-CM | POA: Diagnosis not present

## 2022-03-01 NOTE — Progress Notes (Signed)
Patient ID: Randy Morgan                 DOB: May 13, 1965                    MRN: [id number]     HPI: Randy Morgan is a 57 y.o. male patient referred to pharmacy clinic by Dr Haroldine Laws to initiate weight loss therapy with GLP1-RA. PMH is significant for L9FX, systolic CHF, HTN, OSA on CPAP, tobacco use, and obesity. Most recent BMI 54.  Initially referred for GLP last year but med not covered well with his insurance. Changed to Medicaid this year. Pt with T2DM that his PCP follows him for. Currently on Farxiga (also has CHF). Most recent A1c 6.1% on SGLT2i. Pt interested in trying Ozempic for weight loss. New insurance covers med with prior auth approved through 02/20/23.  Pt picked up Ozempic last week. Gave his 2nd weekly dose yesterday. Has tolerated med well so far, has noticed decrease in his appetite already.  Baseline weight and BMI: 339 lbs, 54  Diet: Has been making improvements with his diet. Stopped eating fried food, either bakes food now or uses an air fryer. Eating more fruit, nuts, cheese, greens. Changed to [place] bacon, eating more fish. Does drink regular soda, sweet tea, and juice in addition to water.  Exercise: minimal now, looking into going to the gym. Has back pain  Family History: Mother with heart disease, DM, and asthma. Father with heart disease. Brother with MI.  Social History: Smoking 1 pack per week. Occasional alcohol, no drug use.  Labs: Lab Results  Component Value Date   HGBA1C 6.1 (H) 02/01/2021    Wt Readings from Last 1 Encounters:  02/05/22 (!) 335 lb 6.4 oz (152.1 kg)    BP Readings from Last 1 Encounters:  02/05/22 (!) 160/110   Pulse Readings from Last 1 Encounters:  02/05/22 100       Component Value Date/Time   CHOL 166 02/03/2021 0327   CHOL 220 (H) 03/02/2020 1034   TRIG 160 (H) 02/03/2021 0327   HDL 36 (L) 02/03/2021 0327   HDL 36 (L) 03/02/2020 1034   CHOLHDL 4.6 02/03/2021 0327   VLDL 32 02/03/2021 0327   LDLCALC 98  02/03/2021 0327   LDLCALC 154 (H) 03/02/2020 1034    Past Medical History:  Diagnosis Date   Asthma    CHF (congestive heart failure) (HCC)    Hypertension    Sleep apnea    hasn't started CPAP yet    Current Outpatient Medications on File Prior to Visit  Medication Sig Dispense Refill   dapagliflozin propanediol (FARXIGA) 10 MG TABS tablet Take 1 tablet (10 mg total) by mouth daily before breakfast. 30 tablet 11   eplerenone (INSPRA) 25 MG tablet Take 1 tablet (25 mg total) by mouth daily. 30 tablet 3   metolazone (ZAROXOLYN) 2.5 MG tablet Take 1 tablet (2.5 mg total) by mouth daily as needed. Take as needed for fluid retention when directed by HF clinic 3 tablet 0   potassium chloride SA (KLOR-CON M) 20 MEQ tablet Take 1 tablet (20 mEq total) by mouth 2 (two) times daily. 60 tablet 2   sacubitril-valsartan (ENTRESTO) 97-103 MG Take 1 tablet by mouth 2 (two) times daily. 60 tablet 11   Semaglutide,0.25 or 0.'5MG'$ /DOS, 2 MG/3ML SOPN Inject 0.'25mg'$  SQ weeky x 4 weeks, then inject 0.'5mg'$  SQ weekly 3 mL 1   sildenafil (VIAGRA) 100 MG tablet Take 0.5 tablets (50  mg total) by mouth as needed for erectile dysfunction. 30 tablet 0   torsemide (DEMADEX) 20 MG tablet Take 3 tablets (60 mg total) by mouth 2 (two) times daily. 120 tablet 11   No current facility-administered medications on file prior to visit.    Allergies  Allergen Reactions   Spironolactone Other (See Comments)    Breast pain/tenderness     Assessment/Plan:  1. T2DM/weight loss - A1c well controlled at 6.1% on Farxiga, however pt would benefit from added weight loss and CV risk reduction of GLP1RA. He picked up his Ozempic last week and has started on injections already. Med covered well by his insurance. Encouraged patient to cut out sweet drinks from his diet (regular soda, sweet tea, and juice). Encouraged him to increase physical activity.  Reviewed injection technique today and advised patient on common side effects  including nausea, diarrhea, dyspepsia, decreased appetite, and fatigue. Counseled patient on reducing meal size and how to titrate medication to minimize side effects.  Titration Plan:  Will plan to follow the titration plan as below, pending patient is tolerating each dose before increasing to the next. Can slow titration if needed for tolerability.    -Month 1: Inject Ozempic 0.'25mg'$  SQ once weekly x 4 weeks -Month 2: Inject Ozempic 0.'5mg'$  SQ once weekly x 6 weeks -Month 3: Inject Ozempic '1mg'$  SQ once weekly x 4 weeks -Month 4+: Inject Ozempic '2mg'$  SQ once weekly   Follow up in 2 weeks via telephone for dose titration.  Kylina Vultaggio E. Ayme Short, PharmD, BCACP, [place]. [address], [place], [place] [address] Phone: [phone]; Fax: [phone] 03/01/2022 2:10 PM

## 2022-03-01 NOTE — Patient Instructions (Signed)
Ozempic Counseling Points This medication reduces your appetite and may make you feel fuller longer.  Stop eating when your body tells you that you are full. This will likely happen sooner than you are used to. Fried/greasy food and sweets may upset your stomach - minimize these as much as possible. Store your medication in the fridge until you are ready to use it. Inject your medication in the fatty tissue of your lower abdominal area (2 inches away from belly button) or upper outer thigh. Rotate injection sites. Common side effects include: nausea, diarrhea/constipation, and heartburn, and are more likely to occur if you overeat. Stop your injection for 7 days prior to surgical procedures requiring anesthesia.  Dosing schedule: - Month 1: Inject 0.'25mg'$  subcutaneously once weekly for 4 weeks - Month 2: Inject 0.'5mg'$  subcutaneously once weekly for 6 weeks - Month 3: Inject '1mg'$  subcutaneously once weekly for 4 weeks - Month 4: Inject '2mg'$  subcutaneously once weekly  Cut back on the sweet drinks! Water is best. Otherwise, look for diet soda or unsweet tea and you can add a sweetener like Stevia or Splenda to make it sweeter  Tips for success: Write down the reasons why you want to lose weight and post it in a place where you'll see it often.  Start small and work your way up. Keep in mind that it takes time to achieve goals, and small steps add up.  Any additional movements help to burn calories. Taking the stairs rather than the elevator and parking at the far end of your parking lot are easy ways to start. Brisk walking for at least 30 minutes 4 or more days of the week is an excellent goal to work toward  Understanding what it means to feel full: Did you know that it can take 15 minutes or more for your brain to receive the message that you've eaten? That means that, if you eat less food, but consume it slower, you may still feel satisfied.  Eating a lot of fruits and vegetables can also  help you feel fuller.  Eat off of smaller plates so that moderate portions don't seem too small  Tips for living a healthier life     Building a Healthy and Balanced Diet Make most of your meal vegetables and fruits -  of your plate. Aim for color and variety, and remember that potatoes don't count as vegetables on the Healthy Eating Plate because of their negative impact on blood sugar.  Go for whole grains -  of your plate. Whole and intact grains--whole wheat, barley, wheat berries, quinoa, oats, brown rice, and foods made with them, such as whole wheat pasta--have a milder effect on blood sugar and insulin than white bread, white rice, and other refined grains.  Protein power -  of your plate. Fish, poultry, beans, and nuts are all healthy, versatile protein sources--they can be mixed into salads, and pair well with vegetables on a plate. Limit red meat, and avoid processed meats such as bacon and sausage.  Healthy plant oils - in moderation. Choose healthy vegetable oils like olive, canola, soy, corn, sunflower, peanut, and others, and avoid partially hydrogenated oils, which contain unhealthy trans fats. Remember that low-fat does not mean "healthy."  Drink water, coffee, or tea. Skip sugary drinks, limit milk and dairy products to one to two servings per day, and limit juice to a small glass per day.  Stay active. The red figure running across the [place] is a  reminder that staying active is also important in weight control.  The main message of the Healthy Eating Plate is to focus on diet quality:  The type of carbohydrate in the diet is more important than the amount of carbohydrate in the diet, because some sources of carbohydrate--like vegetables (other than potatoes), fruits, whole grains, and beans--are healthier than others. The Healthy Eating Plate also advises consumers to avoid sugary beverages, a major source of calories--usually with little  nutritional value--in the American diet. The Healthy Eating Plate encourages consumers to use healthy oils, and it does not set a maximum on the percentage of calories people should get each day from healthy sources of fat. In this way, the Healthy Eating Plate recommends the opposite of the low-fat message promoted for decades by the USDA.  DeskDistributor.no  SUGAR  Sugar is a huge problem in the modern day diet. Sugar is a big contributor to heart disease, diabetes, high triglyceride levels, fatty liver disease and obesity. Sugar is hidden in almost all packaged foods/beverages. Added sugar is extra sugar that is added beyond what is naturally found and has no nutritional benefit for your body. The American Heart Association recommends limiting added sugars to no more than 25g for women and 36 grams for men per day. There are many names for sugar including maltose, sucrose (names ending in "ose"), high fructose corn syrup, molasses, cane sugar, corn sweetener, raw sugar, syrup, honey or fruit juice concentrate.   One of the best ways to limit your added sugars is to stop drinking sweetened beverages such as soda, sweet tea, and fruit juice.  There is 65g of added sugars in one 20oz bottle of Coke! That is equal to 7.5 donuts.   Pay attention and read all nutrition facts labels. Below is an examples of a nutrition facts label. The #1 is showing you the total sugars where the # 2 is showing you the added sugars. This one serving has almost the max amount of added sugars per day!   EXERCISE  Exercise is good. We've all heard that. In an ideal world, we would all have time and resources to get plenty of it. When you are active, your heart pumps more efficiently and you will feel better.  Multiple studies show that even walking regularly has benefits that include living a longer life. The American Heart Association recommends 150 minutes per week of  exercise (30 minutes per day most days of the week). You can do this in any increment you wish. Nine or more 10-minute walks count. So does an hour-long exercise class. Break the time apart into what will work in your life. Some of the best things you can do include walking briskly, jogging, cycling or swimming laps. Not everyone is ready to "exercise." Sometimes we need to start with just getting active. Here are some easy ways to be more active throughout the day:  Take the stairs instead of the elevator  Go for a 10-15 minute walk during your lunch break (find a friend to make it more enjoyable)  When shopping, park at the back of the parking lot  If you take public transportation, get off one stop early and walk the extra distance  Pace around while making phone calls  Check with your doctor if you aren't sure what your limitations may be. Always remember to drink plenty of water when doing any type of exercise. Don't feel like a failure if you're not getting the 90-150 minutes per week.  If you started by being a couch potato, then just a 10-minute walk each day is a huge improvement. Start with little victories and work your way up.   HEALTHY EATING TIPS              Plan ahead: make a menu of the meals for a week then create a grocery list to go with that menu. Consider meals that easily stretch into a night of leftovers, such as stews or casseroles. Or consider making two of your favorite meal and put one in the freezer for another night. Try a night or two each week that is "meatless" or "no cook" such as salads. When you get home from the grocery store wash and prepare your vegetables and fruits. Then when you need them they are ready to go.   Tips for going to the grocery store:  [place] store or generic brands  Check the weekly ad from your store on-line or in their in-store flyer  Look at the unit price on the shelf tag to compare/contrast the costs of different items  Buy fruits/vegetables  in season  Carrots, bananas and apples are low-cost, naturally healthy items  If meats or frozen vegetables are on sale, buy some extras and put in your freezer  Limit buying prepared or "ready to eat" items, even if they are pre-made salads or fruit snacks  Do not shop when you're hungry  Foods at eye level tend to be more expensive. Look on the high and low shelves for deals.  Consider shopping at the farmer's market for fresh foods in season.  Avoid the cookie and chip aisles (these are expensive, high in calories and low in nutritional value). Shop on the outside of the grocery store.  Healthy food preparations:  If you can't get lean hamburger, be sure to drain the fat when cooking  Steam, saut (in olive oil), grill or bake foods  Experiment with different seasonings to avoid adding salt to your foods. Kosher salt, sea salt and Himalayan salt are all still salt and should be avoided. Try seasoning food with onion, garlic, thyme, rosemary, basil ect. Onion powder or garlic powder is ok. Avoid if it says salt (ie garlic salt).

## 2022-03-11 ENCOUNTER — Other Ambulatory Visit: Payer: Self-pay | Admitting: Internal Medicine

## 2022-03-11 DIAGNOSIS — F17211 Nicotine dependence, cigarettes, in remission: Secondary | ICD-10-CM

## 2022-03-14 ENCOUNTER — Other Ambulatory Visit (HOSPITAL_COMMUNITY): Payer: Self-pay | Admitting: Family Medicine

## 2022-03-14 ENCOUNTER — Other Ambulatory Visit (HOSPITAL_COMMUNITY): Payer: Self-pay | Admitting: Internal Medicine

## 2022-03-15 ENCOUNTER — Telehealth: Payer: Self-pay | Admitting: Pharmacist

## 2022-03-15 MED ORDER — METOLAZONE 2.5 MG PO TABS: 2.5000 mg | ORAL_TABLET | Freq: Every day | ORAL | 0 refills | Status: DC | PRN

## 2022-03-15 NOTE — Telephone Encounter (Signed)
Called pt and left message to follow up with Ozempic tolerability and dose titration. Should have given 4th dose of 0.39m yesterday. If tolerating well, will increase to 0.51041mfor the next 6 weeks (2 more doses of 0.41m59mn his current pen and then 4 doses in refill pen).

## 2022-03-18 ENCOUNTER — Telehealth (HOSPITAL_COMMUNITY): Payer: Self-pay

## 2022-03-18 ENCOUNTER — Other Ambulatory Visit (HOSPITAL_COMMUNITY): Payer: Self-pay

## 2022-03-18 NOTE — Telephone Encounter (Signed)
2nd message left for pt.

## 2022-03-18 NOTE — Telephone Encounter (Signed)
Advanced Heart Failure Patient Advocate Encounter  Prior authorization is required for Entresto. PA submitted and APPROVED on 03/18/22.  Key [id number] Effective: 03/04/22 - 03/18/23.  Test billing returns $4 copay for 90 days.  Clista Bernhardt, CPhT Rx Patient Advocate Phone: [phone]

## 2022-03-20 NOTE — Telephone Encounter (Signed)
3rd voicemail left for pt. I will await his return call at this time.

## 2022-04-15 ENCOUNTER — Ambulatory Visit
Admission: RE | Admit: 2022-04-15 | Discharge: 2022-04-15 | Disposition: A | Discharge status: Home / Self Care | Payer: Medicaid Other | Source: Ambulatory Visit | Attending: Internal Medicine | Admitting: Internal Medicine

## 2022-04-15 DIAGNOSIS — F17211 Nicotine dependence, cigarettes, in remission: Secondary | ICD-10-CM

## 2022-04-21 NOTE — Progress Notes (Signed)
ADVANCED HF CLINIC NOTE   PCP: Vevelyn Francois, NP Primary Cardiologist: Dr Harrell Gave  HF Cardiologist: Dr. Haroldine Laws  HPI: Randy Morgan is a 57 y.o. with morbid obesity, HTN, OSA, HFpEF, tobacco abuse, and DM2.   Sleep study 10/2019 AHI 80.with desaturations to 68%.   Admitted 01/30/21 with increased dyspnea in the setting of COVID. Echo EF 35-40%.  He was volume overloaded and diuresed with IV lasix.  CT negative for PE. Started on GDMT. Discharge weight 310 pounds.   R/L Cath 02/16/21 Normal cors. LVEF 45% RA = 12 RV = 44/14 PA = 40/24 (21) PCW = 21 Fick cardiac output/index = 8.0/3.3 PVR = 0.9 WU FA sat = 97% PA sat =  78%, 80% SVC sat = 79%  Follow up 2/23, markedly volume up, weight 328 lbs. Entresto & spiro started, torsemide increased to 40 bid.   Last several follow ups, out of medications and volume up. Good response with metolazone PRN.  Today he returns for HF follow up. At last visit volume overloaded in setting of not taking torsemide. Torsemide restarted and given metolazone. Saw hand surgeon at [facility] (Dr. Elodia Florence) wants clearance to remove left foream lipoma that is compressing nerves. Concerned about his HTN. Says he has been doing well until last week when he ran out of most of his meds including Delene Loll, Farxiga and others. Denies CP. Mild DOE. Occasional LE edema. Remains on Ozempic. [place] 3 days per week.    Cardiac Testing  - Echo (10/2021): EF 55-60% RV mildly reduced. Moderate LVH.   - Echo (01/2021): EF 35-40% Moderate pericardial effusion. Severe LVH.   - Echo (2019): EF 55-60% Grade I DD Mild LVH. Trivial pericardial effusion.   Past Medical History:  Diagnosis Date   Asthma    CHF (congestive heart failure) (HCC)    Hypertension    Sleep apnea    hasn't started CPAP yet   Current Outpatient Medications  Medication Sig Dispense Refill   dapagliflozin propanediol (FARXIGA) 10 MG TABS tablet Take 1 tablet (10 mg total) by  mouth daily before breakfast. 30 tablet 11   metolazone (ZAROXOLYN) 2.5 MG tablet Take 1 tablet (2.5 mg total) by mouth daily as needed. Take as needed for fluid retention when directed by HF clinic 3 tablet 0   potassium chloride SA (KLOR-CON M) 20 MEQ tablet TAKE 2 TABLETS BY MOUTH AS DIRECTED WITH  EVERY  DOSE  OF  METOLAZONE 10 tablet 0   sacubitril-valsartan (ENTRESTO) 97-103 MG Take 1 tablet by mouth 2 (two) times daily. 60 tablet 11   Semaglutide,0.25 or 0.5MG /DOS, 2 MG/3ML SOPN Inject 0.25mg  SQ weeky x 4 weeks, then inject 0.5mg  SQ weekly 3 mL 1   sildenafil (VIAGRA) 100 MG tablet Take 0.5 tablets (50 mg total) by mouth as needed for erectile dysfunction. 30 tablet 0   torsemide (DEMADEX) 20 MG tablet Take 3 tablets (60 mg total) by mouth 2 (two) times daily. 120 tablet 11   eplerenone (INSPRA) 25 MG tablet Take 1 tablet (25 mg total) by mouth daily. (Patient not taking: Reported on 04/22/2022) 30 tablet 3   No current facility-administered medications for this encounter.   Allergies  Allergen Reactions   Spironolactone Other (See Comments)    Breast pain/tenderness   Social History   Socioeconomic History   Marital status: Married    Spouse name: Not on file   Number of children: 2   Years of education: 12th grade + 2  years college   Highest education level: Some college, no degree  Occupational History   Occupation: makes concrete blocks and bricks   Occupation: Freight forwarder  Tobacco Use   Smoking status: Former    Packs/day: 0.50    Years: 25.00    Additional pack years: 0.00    Total pack years: 12.50    Types: Cigarettes    Quit date: 02/25/2021    Years since quitting: 1.1   Smokeless tobacco: Never  Vaping Use   Vaping Use: Never used  Substance and Sexual Activity   Alcohol use: Not Currently    Alcohol/week: 2.0 standard drinks of alcohol    Types: 2 Cans of beer per week    Comment: occ beer   Drug use: No   Sexual activity: Not Currently  Other  Topics Concern   Not on file  Social History Narrative   Lives with his wife and daughter.   Other child lives independently in [place].   Drinks about 3 sodas a day       Pt stated he quit smoking   Social Determinants of Health   Financial Resource Strain: High Risk (02/01/2021)   Overall Financial Resource Strain (CARDIA)    Difficulty of Paying Living Expenses: Hard  Food Insecurity: No Food Insecurity (02/01/2021)   Hunger Vital Sign    Worried About Running Out of Food in the Last Year: Never true    Ran Out of Food in the Last Year: Never true  Transportation Needs: No Transportation Needs (02/01/2021)   PRAPARE - Hydrologist (Medical): No    Lack of Transportation (Non-Medical): No  Physical Activity: Not on file  Stress: Not on file  Social Connections: Not on file  Intimate Partner Violence: Not on file   Family History  Problem Relation Age of Onset   Heart disease Mother    Diabetes Mother    Asthma Mother    Heart disease Father    Asthma Sister    Heart disease Brother    Heart attack Brother    Colon cancer Neg Hx    Esophageal cancer Neg Hx    Rectal cancer Neg Hx    Stomach cancer Neg Hx    BP (!) 140/80   Pulse (!) 101   Wt (!) 153.5 kg (338 lb 6.4 oz)   SpO2 93%   BMI 53.80 kg/m   Wt Readings from Last 3 Encounters:  04/22/22 (!) 153.5 kg (338 lb 6.4 oz)  03/01/22 (!) 153.8 kg (339 lb)  02/05/22 (!) 152.1 kg (335 lb 6.4 oz)   PHYSICAL EXAM: General:  Well appearing. No resp difficulty HEENT: normal Neck: supple. no JVD. Carotids 2+ bilat; no bruits. No lymphadenopathy or thryomegaly appreciated. Cor: PMI nondisplaced. Regular rate & rhythm. No rubs, gallops or murmurs. Lungs: clear Abdomen: obese soft, nontender, nondistended. No hepatosplenomegaly. No bruits or masses. Good bowel sounds. Extremities: no cyanosis, clubbing, rash, 2+ edema L forearm lipoma  Neuro: alert & orientedx3, cranial nerves grossly intact.  moves all 4 extremities w/o difficulty. Affect pleasant   ASSESSMENT & PLAN:  1. Chronic HFiEF - Echo (1/23): 35-40% LVH mod pericardial effusion. EF previously 55% in 2019.  - Cath (1/23): Normal cors. EF 45% (suspect HTN in nature). Mildly elevated pressures with high CO - Echo (10/23): EF improved 55-60%. RV mildly reduced.   - Stable NYHA early III-III, functional class confounded by body habitus. Volume up again today. - Take metolazone  2.5 mg + extra 40 KCL with morning dose of torsemide tomorrow x1 and PRN. - Continue torsemide 60 mg bid. Keep KCL 40 daily.  - Resume Farxiga 10 mg daily. No GU symptoms. - Resume Entresto 97/103 mg bid. - Resume eplerenone 25 mg daily (breast pain with spiro) - Reinforced need to call  us BEFORE he runs out of meds - Labs today  2. Hypertension  - Improving. - Reinforced medication compliance.  - GDMT as above. - Continue CPAP   3. OSA, severe - Now has CPAP device, using intermittently  - Discussed compliance.   - Follows with Dr. Claiborne Billings  4. DM2 - Hgb A1C 6.1  - Continue SGLT2i. - Followed by PCP   5. Obesity, morbid - Body mass index is 53.8 kg/m. - Continue Ozempic  6. Tobacco Abuse - Former heavy smoker - Now quit since 1/24   7. Pre-op CV clearance - he is at moderate risk for surgery from cardiac standpoint. Ok to proceed - given size would also consider Pulmonary/Anesthesia clearance   Glori Bickers, MD  10:11 AM

## 2022-04-22 ENCOUNTER — Ambulatory Visit (HOSPITAL_COMMUNITY)
Admission: RE | Admit: 2022-04-22 | Discharge: 2022-04-22 | Disposition: A | Discharge status: Home / Self Care | Payer: Medicaid Other | Source: Ambulatory Visit | Attending: Internal Medicine | Admitting: Internal Medicine

## 2022-04-22 ENCOUNTER — Encounter (HOSPITAL_COMMUNITY): Payer: Self-pay | Admitting: Internal Medicine

## 2022-04-22 VITALS — BP 140/80 | HR 101 | Wt 338.4 lb

## 2022-04-22 DIAGNOSIS — Z79899 Other long term (current) drug therapy: Secondary | ICD-10-CM | POA: Diagnosis not present

## 2022-04-22 DIAGNOSIS — Z87891 Personal history of nicotine dependence: Secondary | ICD-10-CM | POA: Insufficient documentation

## 2022-04-22 DIAGNOSIS — I110 Hypertensive heart disease with heart failure: Secondary | ICD-10-CM | POA: Insufficient documentation

## 2022-04-22 DIAGNOSIS — I5042 Chronic combined systolic (congestive) and diastolic (congestive) heart failure: Secondary | ICD-10-CM

## 2022-04-22 DIAGNOSIS — I428 Other cardiomyopathies: Secondary | ICD-10-CM | POA: Diagnosis not present

## 2022-04-22 DIAGNOSIS — I5022 Chronic systolic (congestive) heart failure: Secondary | ICD-10-CM | POA: Diagnosis not present

## 2022-04-22 DIAGNOSIS — E119 Type 2 diabetes mellitus without complications: Secondary | ICD-10-CM | POA: Insufficient documentation

## 2022-04-22 DIAGNOSIS — Z01810 Encounter for preprocedural cardiovascular examination: Secondary | ICD-10-CM | POA: Diagnosis not present

## 2022-04-22 DIAGNOSIS — Z91148 Patient's other noncompliance with medication regimen for other reason: Secondary | ICD-10-CM | POA: Insufficient documentation

## 2022-04-22 DIAGNOSIS — Z6841 Body Mass Index (BMI) 40.0 and over, adult: Secondary | ICD-10-CM | POA: Diagnosis not present

## 2022-04-22 DIAGNOSIS — I5032 Chronic diastolic (congestive) heart failure: Secondary | ICD-10-CM | POA: Diagnosis not present

## 2022-04-22 DIAGNOSIS — I3139 Other pericardial effusion (noninflammatory): Secondary | ICD-10-CM | POA: Diagnosis not present

## 2022-04-22 DIAGNOSIS — G4733 Obstructive sleep apnea (adult) (pediatric): Secondary | ICD-10-CM | POA: Diagnosis not present

## 2022-04-22 LAB — BASIC METABOLIC PANEL
Anion gap: 8 (ref 5–15)
BUN: 10 mg/dL (ref 6–20)
CO2: 28 mmol/L (ref 22–32)
Calcium: 8.8 mg/dL — ABNORMAL LOW (ref 8.9–10.3)
Chloride: 98 mmol/L (ref 98–111)
Creatinine, Ser: 1.04 mg/dL (ref 0.61–1.24)
GFR, Estimated: >60 mL/min (ref >60)
Glucose, Bld: 311 mg/dL — ABNORMAL HIGH (ref 70–99)
Potassium: 3.2 mmol/L — ABNORMAL LOW (ref 3.5–5.1)
Sodium: 134 mmol/L — ABNORMAL LOW (ref 135–145)

## 2022-04-22 MED ORDER — TORSEMIDE 20 MG PO TABS: 60.0000 mg | ORAL_TABLET | Freq: Two times a day (BID) | ORAL | 11 refills | Status: DC

## 2022-04-22 MED ORDER — SACUBITRIL-VALSARTAN 97-103 MG PO TABS: 1.0000 | ORAL_TABLET | Freq: Two times a day (BID) | ORAL | 11 refills | Status: DC

## 2022-04-22 MED ORDER — DAPAGLIFLOZIN PROPANEDIOL 10 MG PO TABS: 10.0000 mg | ORAL_TABLET | Freq: Every day | ORAL | 11 refills | Status: DC

## 2022-04-22 NOTE — Patient Instructions (Signed)
TAKE Metolazone x1 with potassium.  Labs done today, your results will be available in MyChart, we will contact you for abnormal readings.  Your physician recommends that you schedule a follow-up appointment in: 4 months (July) ** please call the office in May to arrange your follow up appointment.**  If you have any questions or concerns before your next appointment please send [place] a message through [place] or call our office at [phone].    TO LEAVE A MESSAGE FOR THE NURSE SELECT OPTION 2, PLEASE LEAVE A MESSAGE INCLUDING: YOUR NAME DATE OF BIRTH CALL BACK NUMBER REASON FOR CALL**this is important as we prioritize the call backs  YOU WILL RECEIVE A CALL BACK THE SAME DAY AS LONG AS YOU CALL BEFORE 4:00 PM  At the [facility], you and your health needs are our priority. As part of our continuing mission to provide you with exceptional heart care, we have created designated Provider Care Teams. These Care Teams include your primary Cardiologist (physician) and Advanced Practice Providers (APPs- Physician Assistants and Nurse Practitioners) who all work together to provide you with the care you need, when you need it.   You may see any of the following providers on your designated Care Team at your next follow up: Dr Glori Bickers Dr Loralie Champagne Dr. Roxana Hires, NP Lyda Jester, [place] [facility] [place], [place] Forestine Na, NP Audry Riles, PharmD   Please be sure to bring in all your medications bottles to every appointment.    Thank you for choosing [facility]

## 2022-04-23 ENCOUNTER — Other Ambulatory Visit (HOSPITAL_COMMUNITY): Payer: Self-pay

## 2022-04-23 ENCOUNTER — Telehealth (HOSPITAL_COMMUNITY): Payer: Self-pay | Admitting: Pharmacy Technician

## 2022-04-23 NOTE — Telephone Encounter (Signed)
Advanced Heart Failure Patient Advocate Encounter  Prior Authorization for [name] has been approved.    PA# [id number] Effective dates: 04/23/22 through 01/27/2038  Charlann Boxer, CPhT

## 2022-04-23 NOTE — Telephone Encounter (Signed)
Patient Advocate Encounter   Received notification from [facility] that prior authorization for [name] is required.   PA submitted on CoverMyMeds Key [id number] Status is pending   Will continue to follow.

## 2022-04-24 ENCOUNTER — Telehealth (HOSPITAL_COMMUNITY): Payer: Self-pay

## 2022-04-24 DIAGNOSIS — I5032 Chronic diastolic (congestive) heart failure: Secondary | ICD-10-CM

## 2022-04-24 MED ORDER — POTASSIUM CHLORIDE CRYS ER 20 MEQ PO TBCR: 20.0000 meq | EXTENDED_RELEASE_TABLET | Freq: Every day | ORAL | 0 refills | Status: DC

## 2022-04-24 NOTE — Telephone Encounter (Signed)
Patient advised and verbalized understanding,lab appointment scheduled,lab orders entered, med list updated to reflect changes.   Meds ordered this encounter  Medications   potassium chloride SA (KLOR-CON M) 20 MEQ tablet    Sig: Take 1 tablet (20 mEq total) by mouth daily. Take 2 extra tablets on metolazone days.    Dispense:  10 tablet    Refill:  0   Orders Placed This Encounter  Procedures   Basic metabolic panel    Standing Status:   Future    Standing Expiration Date:   04/24/2023    Order Specific Question:   Release to patient    Answer:   Immediate    Order Specific Question:   Release to patient    Answer:   Immediate [1]

## 2022-04-24 NOTE — Telephone Encounter (Signed)
-----   Message from Jolaine Artist, MD sent at 04/24/2022  4:06 PM EDT ----- Potassium is low. Take 53meq potassium daily. (Take 2 pills (43meq) on first day). Recheck 2 weeks please.

## 2022-05-08 ENCOUNTER — Other Ambulatory Visit (HOSPITAL_COMMUNITY): Payer: Medicaid Other

## 2022-05-09 ENCOUNTER — Other Ambulatory Visit (HOSPITAL_COMMUNITY): Payer: Self-pay | Admitting: Radiology

## 2022-05-09 DIAGNOSIS — J432 Centrilobular emphysema: Secondary | ICD-10-CM

## 2022-05-13 ENCOUNTER — Ambulatory Visit (HOSPITAL_COMMUNITY)
Admission: RE | Admit: 2022-05-13 | Discharge: 2022-05-13 | Disposition: A | Discharge status: Home / Self Care | Payer: Medicaid Other | Source: Ambulatory Visit | Attending: Internal Medicine | Admitting: Internal Medicine

## 2022-05-13 DIAGNOSIS — J432 Centrilobular emphysema: Secondary | ICD-10-CM

## 2022-05-13 DIAGNOSIS — I5032 Chronic diastolic (congestive) heart failure: Secondary | ICD-10-CM | POA: Insufficient documentation

## 2022-05-13 LAB — PULMONARY FUNCTION TEST
DL/VA % pred: 111 %
DL/VA: 4.85 ml/min/mmHg/L
DLCO unc % pred: 87 %
DLCO unc: 22.05 ml/min/mmHg
FEF 25-75 Pre: 1.77 L/sec
FEF2575-%Pred-Pre: 62 %
FEV1-%Pred-Pre: 61 %
FEV1-Pre: 2.02 L
FEV1FVC-%Pred-Pre: 101 %
FEV6-%Pred-Pre: 63 %
FEV6-Pre: 2.62 L
FEV6FVC-%Pred-Pre: 104 %
FVC-%Pred-Pre: 60 %
FVC-Pre: 2.62 L
Pre FEV1/FVC ratio: 77 %
Pre FEV6/FVC Ratio: 100 %
RV % pred: 113 %
RV: 2.25 L
TLC % pred: 79 %
TLC: 5.02 L

## 2022-05-13 LAB — BASIC METABOLIC PANEL
Anion gap: 8 (ref 5–15)
BUN: 9 mg/dL (ref 6–20)
CO2: 29 mmol/L (ref 22–32)
Calcium: 8.5 mg/dL — ABNORMAL LOW (ref 8.9–10.3)
Chloride: 97 mmol/L — ABNORMAL LOW (ref 98–111)
Creatinine, Ser: 1.08 mg/dL (ref 0.61–1.24)
GFR, Estimated: >60 mL/min (ref >60)
Glucose, Bld: 460 mg/dL — ABNORMAL HIGH (ref 70–99)
Potassium: 3.4 mmol/L — ABNORMAL LOW (ref 3.5–5.1)
Sodium: 134 mmol/L — ABNORMAL LOW (ref 135–145)

## 2022-05-20 ENCOUNTER — Telehealth (HOSPITAL_COMMUNITY): Payer: Self-pay

## 2022-05-20 DIAGNOSIS — I5032 Chronic diastolic (congestive) heart failure: Secondary | ICD-10-CM

## 2022-05-20 NOTE — Telephone Encounter (Signed)
-----   Message from Dolores Patty, MD sent at 05/19/2022  3:34 PM EDT ----- Double potassium to 40 daily. Recheck 2 weeks please.

## 2022-05-20 NOTE — Telephone Encounter (Signed)
Patient labs ordered and appointment scheduled. Pt aware, agreeable, and verbalized understanding

## 2022-06-03 ENCOUNTER — Other Ambulatory Visit (HOSPITAL_COMMUNITY): Payer: Medicaid Other

## 2023-08-21 ENCOUNTER — Encounter (HOSPITAL_COMMUNITY)

## 2023-08-21 ENCOUNTER — Telehealth (HOSPITAL_COMMUNITY): Payer: Self-pay | Admitting: Physician Assistant

## 2023-08-21 ENCOUNTER — Other Ambulatory Visit: Payer: Self-pay

## 2023-08-21 NOTE — Progress Notes (Incomplete)
 ADVANCED HF CLINIC NOTE   PCP: Myrna Camelia HERO, NP Primary Cardiologist: Dr Lonni  HF Cardiologist: Dr. Cherrie  HPI: Randy Morgan is a 58 y.o. with morbid obesity, HTN, OSA, HFpEF, tobacco abuse, and DM2.   Sleep study 10/2019 AHI 80.with desaturations to 68%.   Admitted 01/30/21 with increased dyspnea in the setting of COVID. Echo EF 35-40%.  He was volume overloaded and diuresed with IV lasix .  CT negative for PE. Started on GDMT. Discharge weight 310 pounds.   R/L Cath 02/16/21 Normal cors. LVEF 45% RA = 12 RV = 44/14 PA = 40/24 (21) PCW = 21 Fick cardiac output/index = 8.0/3.3 PVR = 0.9 WU FA sat = 97% PA sat =  78%, 80% SVC sat = 79%  Echo 10/23 EF improved to 55-60%, RV mildly reduced.  He is here today for overdue CHF follow-up. Last seen in clinic in 03/24.  Cardiac Testing  - Echo (10/2021): EF 55-60% RV mildly reduced. Moderate LVH.   - Echo (01/2021): EF 35-40% Moderate pericardial effusion. Severe LVH.   - Echo (2019): EF 55-60% Grade I DD Mild LVH. Trivial pericardial effusion.   Past Medical History:  Diagnosis Date   Asthma    CHF (congestive heart failure) (HCC)    Hypertension    Sleep apnea    hasn't started CPAP yet   Current Outpatient Medications  Medication Sig Dispense Refill   dapagliflozin  propanediol (FARXIGA ) 10 MG TABS tablet Take 1 tablet (10 mg total) by mouth daily before breakfast. 30 tablet 11   eplerenone  (INSPRA ) 25 MG tablet Take 1 tablet (25 mg total) by mouth daily. (Patient not taking: Reported on 04/22/2022) 30 tablet 3   metolazone  (ZAROXOLYN ) 2.5 MG tablet Take 1 tablet (2.5 mg total) by mouth daily as needed. Take as needed for fluid retention when directed by HF clinic 3 tablet 0   potassium chloride  SA (KLOR-CON  M) 20 MEQ tablet Take 1 tablet (20 mEq total) by mouth daily. Take 2 extra tablets on metolazone  days. 10 tablet 0   sacubitril -valsartan  (ENTRESTO ) 97-103 MG Take 1 tablet by mouth 2 (two) times daily. 60  tablet 11   Semaglutide ,0.25 or 0.5MG /DOS, 2 MG/3ML SOPN Inject 0.25mg  SQ weeky x 4 weeks, then inject 0.5mg  SQ weekly 3 mL 1   sildenafil  (VIAGRA ) 100 MG tablet Take 0.5 tablets (50 mg total) by mouth as needed for erectile dysfunction. 30 tablet 0   torsemide  (DEMADEX ) 20 MG tablet Take 3 tablets (60 mg total) by mouth 2 (two) times daily. 120 tablet 11   No current facility-administered medications for this visit.   Allergies  Allergen Reactions   Spironolactone  Other (See Comments)    Breast pain/tenderness   Social History   Socioeconomic History   Marital status: Married    Spouse name: Not on file   Number of children: 2   Years of education: 12th grade + 2 years college   Highest education level: Some college, no degree  Occupational History   Occupation: makes concrete blocks and bricks   Occupation: Estate agent  Tobacco Use   Smoking status: Former    Current packs/day: 0.00    Average packs/day: 0.5 packs/day for 25.0 years (12.5 ttl pk-yrs)    Types: Cigarettes    Start date: 02/26/1996    Quit date: 02/25/2021    Years since quitting: 2.4   Smokeless tobacco: Never  Vaping Use   Vaping status: Never Used  Substance and Sexual Activity  Alcohol use: Not Currently    Alcohol/week: 2.0 standard drinks of alcohol    Types: 2 Cans of beer per week    Comment: occ beer   Drug use: No   Sexual activity: Not Currently  Other Topics Concern   Not on file  Social History Narrative   Lives with his wife and daughter.   Other child lives independently in [place] .   Drinks about 3 sodas a day       Pt stated he quit smoking   Social Drivers of Corporate investment banker Strain: High Risk (02/01/2021)   Overall Financial Resource Strain (CARDIA)    Difficulty of Paying Living Expenses: Hard  Food Insecurity: No Food Insecurity (02/01/2021)   Hunger Vital Sign    Worried About Running Out of Food in the Last Year: Never true    Ran Out of Food in the Last  Year: Never true  Transportation Needs: No Transportation Needs (02/01/2021)   PRAPARE - Administrator, Civil Service (Medical): No    Lack of Transportation (Non-Medical): No  Physical Activity: Not on file  Stress: Not on file  Social Connections: Not on file  Intimate Partner Violence: Not on file   Family History  Problem Relation Age of Onset   Heart disease Mother    Diabetes Mother    Asthma Mother    Heart disease Father    Asthma Sister    Heart disease Brother    Heart attack Brother    Colon cancer Neg Hx    Esophageal cancer Neg Hx    Rectal cancer Neg Hx    Stomach cancer Neg Hx    There were no vitals taken for this visit.  Wt Readings from Last 3 Encounters:  04/22/22 (!) 153.5 kg (338 lb 6.4 oz)  03/01/22 (!) 153.8 kg (339 lb)  02/05/22 (!) 152.1 kg (335 lb 6.4 oz)   PHYSICAL EXAM: General:  Well appearing. No resp difficulty HEENT: normal Neck: supple. no JVD. Carotids 2+ bilat; no bruits. No lymphadenopathy or thryomegaly appreciated. Cor: PMI nondisplaced. Regular rate & rhythm. No rubs, gallops or murmurs. Lungs: clear Abdomen: obese soft, nontender, nondistended. No hepatosplenomegaly. No bruits or masses. Good bowel sounds. Extremities: no cyanosis, clubbing, rash, 2+ edema L forearm lipoma  Neuro: alert & orientedx3, cranial nerves grossly intact. moves all 4 extremities w/o difficulty. Affect pleasant   ASSESSMENT & PLAN:  1. Chronic HFiEF - Echo (1/23): 35-40% LVH mod pericardial effusion. EF previously 55% in 2019.  - Cath (1/23): Normal cors. EF 45% (suspect HTN in nature). Mildly elevated pressures with high CO - Echo (10/23): EF improved 55-60%. RV mildly reduced.   - Stable NYHA ***. Volume ***.  - Take metolazone  2.5 mg + extra 40 KCL with morning dose of torsemide  tomorrow x1 and PRN. - Continue torsemide  60 mg bid. Keep KCL 40 daily.  - Resume Farxiga  10 mg daily. No GU symptoms. - Resume Entresto  97/103 mg bid. - Resume  eplerenone  25 mg daily (breast pain with spiro) - Reinforced need to call us  BEFORE he runs out of meds - Labs today  2. Hypertension  - Improving. - Reinforced medication compliance.  - GDMT as above. - Continue CPAP   3. OSA, severe - Now has CPAP device, using intermittently  - Discussed compliance.   - Follows with Dr. Burnard  4. DM2 - Hgb A1C 6.1  - Continue SGLT2i. - Followed by PCP   5. Obesity,  morbid - There is no height or weight on file to calculate BMI. - Continue Ozempic   6. Tobacco Abuse - Former heavy smoker - Now quit since 1/24   Follow-up:   Laronica Bhagat N, PA-C  7:42 AM

## 2023-08-28 ENCOUNTER — Telehealth (HOSPITAL_COMMUNITY): Payer: Self-pay

## 2023-08-28 NOTE — Telephone Encounter (Signed)
 Called to confirm/remind patient of their appointment at the [facility] on 8/1/2 1:30.   Appointment:   [x] Confirmed  [] Left mess   [] No answer/No voice mail  [] VM Full/unable to leave message  [] Phone not in service  Patient reminded to bring all medications and/or complete list.  Confirmed patient has transportation. Gave directions, instructed to utilize valet parking. SABRA

## 2023-08-29 ENCOUNTER — Ambulatory Visit (HOSPITAL_COMMUNITY)
Admission: RE | Admit: 2023-08-29 | Discharge: 2023-08-29 | Disposition: A | Discharge status: Home / Self Care | Source: Ambulatory Visit | Attending: Cardiology | Admitting: Cardiology

## 2023-08-29 ENCOUNTER — Ambulatory Visit (HOSPITAL_COMMUNITY): Payer: Self-pay | Admitting: Cardiology

## 2023-08-29 ENCOUNTER — Encounter (HOSPITAL_COMMUNITY): Payer: Self-pay

## 2023-08-29 VITALS — BP 162/102 | HR 100 | Ht 66.0 in | Wt 292.0 lb

## 2023-08-29 DIAGNOSIS — Z7984 Long term (current) use of oral hypoglycemic drugs: Secondary | ICD-10-CM | POA: Insufficient documentation

## 2023-08-29 DIAGNOSIS — E119 Type 2 diabetes mellitus without complications: Secondary | ICD-10-CM | POA: Insufficient documentation

## 2023-08-29 DIAGNOSIS — Z6841 Body Mass Index (BMI) 40.0 and over, adult: Secondary | ICD-10-CM | POA: Insufficient documentation

## 2023-08-29 DIAGNOSIS — I110 Hypertensive heart disease with heart failure: Secondary | ICD-10-CM | POA: Insufficient documentation

## 2023-08-29 DIAGNOSIS — Z5986 Financial insecurity: Secondary | ICD-10-CM | POA: Insufficient documentation

## 2023-08-29 DIAGNOSIS — F17210 Nicotine dependence, cigarettes, uncomplicated: Secondary | ICD-10-CM | POA: Diagnosis not present

## 2023-08-29 DIAGNOSIS — G4733 Obstructive sleep apnea (adult) (pediatric): Secondary | ICD-10-CM | POA: Diagnosis not present

## 2023-08-29 DIAGNOSIS — I5032 Chronic diastolic (congestive) heart failure: Secondary | ICD-10-CM | POA: Insufficient documentation

## 2023-08-29 DIAGNOSIS — Z91199 Patient's noncompliance with other medical treatment and regimen due to unspecified reason: Secondary | ICD-10-CM | POA: Diagnosis not present

## 2023-08-29 LAB — BASIC METABOLIC PANEL WITH GFR
Anion gap: 11 (ref 5–15)
BUN: 10 mg/dL (ref 6–20)
CO2: 26 mmol/L (ref 22–32)
Calcium: 8.6 mg/dL — ABNORMAL LOW (ref 8.9–10.3)
Chloride: 98 mmol/L (ref 98–111)
Creatinine, Ser: 0.76 mg/dL (ref 0.61–1.24)
GFR, Estimated: >60 mL/min (ref >60)
Glucose, Bld: 340 mg/dL — ABNORMAL HIGH (ref 70–99)
Potassium: 3.7 mmol/L (ref 3.5–5.1)
Sodium: 135 mmol/L (ref 135–145)

## 2023-08-29 LAB — BRAIN NATRIURETIC PEPTIDE: B Natriuretic Peptide: 311.3 pg/mL — ABNORMAL HIGH (ref 0.0–100.0)

## 2023-08-29 MED ORDER — SACUBITRIL-VALSARTAN 49-51 MG PO TABS: 1.0000 | ORAL_TABLET | Freq: Two times a day (BID) | ORAL | 3 refills | Status: DC

## 2023-08-29 NOTE — Patient Instructions (Signed)
 Medication Changes:  RESTART ENTRESTO  49/51MG  TWICE DAILY   Lab Work:  Labs done today, your results will be available in MyChart, we will contact you for abnormal readings.  THEN LABS AGAIN IN 1 WEEK AS SCHEDULED   Testing/Procedures:  ECHOCARDIOGRAM AS SCHEDULED   Referrals:  YOU HAVE BEEN REFERRED TO PHARMACY CLINIC FOR GLP-1 THEY WILL REACH OUT TO YOU OR CALL TO ARRANGE THIS. PLEASE CALL US  WITH ANY CONCERNS   Follow-Up in: PHARMD IN 4 WEEKS AS SCHEDULED   IN 8 WEEKS WITH APP AS SCHEDULED   At the [facility], you and your health needs are our priority. We have a designated team specialized in the treatment of Heart Failure. This Care Team includes your primary Heart Failure Specialized Cardiologist (physician), Advanced Practice Providers (APPs- Physician Assistants and Nurse Practitioners), and Pharmacist who all work together to provide you with the care you need, when you need it.   You may see any of the following providers on your designated Care Team at your next follow up:  Dr. Toribio Fuel Dr. Ezra Shuck Dr. Ria Commander Dr. Odis Brownie Greig Mosses, NP Caffie Shed, [place] [facility] [place], [place] Beckey Coe, NP [place] Lee, NP Tinnie Redman, PharmD   Please be sure to bring in all your medications bottles to every appointment.   Need to Contact Us :  If you have any questions or concerns before your next appointment please send us  a message through [place] or call our office at [phone].    TO LEAVE A MESSAGE FOR THE NURSE SELECT OPTION 2, PLEASE LEAVE A MESSAGE INCLUDING: YOUR NAME DATE OF BIRTH CALL BACK NUMBER REASON FOR CALL**this is important as we prioritize the call backs  YOU WILL RECEIVE A CALL BACK THE SAME DAY AS LONG AS YOU CALL BEFORE 4:00 PM

## 2023-08-29 NOTE — Progress Notes (Signed)
 ADVANCED HF CLINIC NOTE   PCP: Myrna Camelia HERO, NP Primary Cardiologist: Dr Lonni  HF Cardiologist: Dr. Cherrie  Reason for Visit: f/u for HFimEF   HPI: Randy Morgan is a 58 y.o. with morbid obesity, HTN, OSA, HFpEF, tobacco abuse, and DM2.   Sleep study 10/2019 AHI 80 with desaturations to 68%.   Admitted 01/30/21 with increased dyspnea in the setting of COVID. Echo EF 35-40%.  He was volume overloaded and diuresed with IV lasix .  CT negative for PE. Started on GDMT. Discharge weight 310 pounds.   R/L Cath 02/16/21 Normal cors. LVEF 45% RA = 12 RV = 44/14 PA = 40/24 (21) PCW = 21 Fick cardiac output/index = 8.0/3.3 PVR = 0.9 WU FA sat = 97% PA sat =  78%, 80% SVC sat = 79%  Echo 10/23 EF improved to 55-60%, RV mildly reduced.  He is here today for overdue CHF follow-up. Last seen in clinic in 03/24.    Says he has been off all meds x 6 months. Lost his meds when he moved homes. Despite being off meds, he says he feels great. Denies resting dyspnea but NYHA IIb-III. He is severely obese. Body mass index is 47.13 kg/m. Not compliant w/ CPAP. He was on Ozempic  and would like to restart. He had quit smoking but picked back up. Smoking < 1/4 ppd.   He denies CP.   BP elevated today 162/102.  EKG shows NSR 96 bpm.   Overall poor insight.   Cardiac Testing  - Echo (10/2021): EF 55-60% RV mildly reduced. Moderate LVH.   - Echo (01/2021): EF 35-40% Moderate pericardial effusion. Severe LVH.   - Echo (2019): EF 55-60% Grade I DD Mild LVH. Trivial pericardial effusion.   Past Medical History:  Diagnosis Date   Asthma    CHF (congestive heart failure) (HCC)    Hypertension    Sleep apnea    hasn't started CPAP yet   Current Outpatient Medications  Medication Sig Dispense Refill   sacubitril -valsartan  (ENTRESTO ) 49-51 MG Take 1 tablet by mouth 2 (two) times daily. 60 tablet 3   dapagliflozin  propanediol (FARXIGA ) 10 MG TABS tablet Take 1 tablet (10 mg total)  by mouth daily before breakfast. (Patient not taking: Reported on 08/29/2023) 30 tablet 11   eplerenone  (INSPRA ) 25 MG tablet Take 1 tablet (25 mg total) by mouth daily. (Patient not taking: Reported on 08/29/2023) 30 tablet 3   metolazone  (ZAROXOLYN ) 2.5 MG tablet Take 1 tablet (2.5 mg total) by mouth daily as needed. Take as needed for fluid retention when directed by HF clinic (Patient not taking: Reported on 08/29/2023) 3 tablet 0   potassium chloride  SA (KLOR-CON  M) 20 MEQ tablet Take 1 tablet (20 mEq total) by mouth daily. Take 2 extra tablets on metolazone  days. (Patient not taking: Reported on 08/29/2023) 10 tablet 0   Semaglutide ,0.25 or 0.5MG /DOS, 2 MG/3ML SOPN Inject 0.25mg  SQ weeky x 4 weeks, then inject 0.5mg  SQ weekly (Patient not taking: Reported on 08/29/2023) 3 mL 1   sildenafil  (VIAGRA ) 100 MG tablet Take 0.5 tablets (50 mg total) by mouth as needed for erectile dysfunction. (Patient not taking: Reported on 08/29/2023) 30 tablet 0   torsemide  (DEMADEX ) 20 MG tablet Take 3 tablets (60 mg total) by mouth 2 (two) times daily. (Patient not taking: Reported on 08/29/2023) 120 tablet 11   No current facility-administered medications for this encounter.   Allergies  Allergen Reactions   Spironolactone  Other (See Comments)  Breast pain/tenderness   Social History   Socioeconomic History   Marital status: Married    Spouse name: Not on file   Number of children: 2   Years of education: 12th grade + 2 years college   Highest education level: Some college, no degree  Occupational History   Occupation: makes concrete blocks and bricks   Occupation: Estate agent  Tobacco Use   Smoking status: Former    Current packs/day: 0.00    Average packs/day: 0.5 packs/day for 25.0 years (12.5 ttl pk-yrs)    Types: Cigarettes    Start date: 02/26/1996    Quit date: 02/25/2021    Years since quitting: 2.5   Smokeless tobacco: Never  Vaping Use   Vaping status: Never Used  Substance and Sexual  Activity   Alcohol use: Not Currently    Alcohol/week: 2.0 standard drinks of alcohol    Types: 2 Cans of beer per week    Comment: occ beer   Drug use: No   Sexual activity: Not Currently  Other Topics Concern   Not on file  Social History Narrative   Lives with his wife and daughter.   Other child lives independently in [place] .   Drinks about 3 sodas a day       Pt stated he quit smoking   Social Drivers of Corporate investment banker Strain: High Risk (02/01/2021)   Overall Financial Resource Strain (CARDIA)    Difficulty of Paying Living Expenses: Hard  Food Insecurity: No Food Insecurity (02/01/2021)   Hunger Vital Sign    Worried About Running Out of Food in the Last Year: Never true    Ran Out of Food in the Last Year: Never true  Transportation Needs: No Transportation Needs (02/01/2021)   PRAPARE - Administrator, Civil Service (Medical): No    Lack of Transportation (Non-Medical): No  Physical Activity: Not on file  Stress: Not on file  Social Connections: Not on file  Intimate Partner Violence: Not on file   Family History  Problem Relation Age of Onset   Heart disease Mother    Diabetes Mother    Asthma Mother    Heart disease Father    Asthma Sister    Heart disease Brother    Heart attack Brother    Colon cancer Neg Hx    Esophageal cancer Neg Hx    Rectal cancer Neg Hx    Stomach cancer Neg Hx    BP (!) 162/102   Pulse 100   Ht 5' 6 (1.676 m)   Wt 132.5 kg (292 lb)   SpO2 94%   BMI 47.13 kg/m   Wt Readings from Last 3 Encounters:  08/29/23 132.5 kg (292 lb)  04/22/22 (!) 153.5 kg (338 lb 6.4 oz)  03/01/22 (!) 153.8 kg (339 lb)   Vitals:   08/29/23 1326  BP: (!) 162/102  Pulse: 100  SpO2: 94%   GENERAL: super morbidly obese, NAD Lungs- clear CARDIAC:  JVP not elevated          Normal rate with regular rhythm. No murmur.  No edema.  ABDOMEN: Obese, Soft, non-tender, non-distended.  EXTREMITIES: Warm and well perfused.   NEUROLOGIC: No obvious FND    ASSESSMENT & PLAN:  1. Chronic HFiEF - Echo (1/23): 35-40% LVH mod pericardial effusion. EF previously 55% in 2019.  - Cath (1/23): Normal cors. EF 45% (suspect HTN in nature). Mildly elevated pressures with high CO - Echo (10/23): EF  improved 55-60%. RV mildly reduced.   - has been off all meds x 6 months. BP elevated. Reports NYHA IIb-III symptoms. Volume assessment difficult on exam due to body habitus but no appreciable LEE or JVD noted.  - check BMP and BNP  - gradually restart GDMT - restart Entresto  49-51 mg bid. Repeat BMP in 7 days  - Update Echo  - stressed importance of maintaining strict compliance w/ meds and f/u   2. Hypertension  - elevated in setting of med and CPAP noncompliance, currently not on any meds - restart Entresto  per above - BMP today   3. OSA, severe - has CPAP device but noncompliant   - Discussed compliance.  He says he will restart CPAP  - Previously followed with Dr. Burnard. Will need to transition to Dr. Shlomo   4. DM2 - hasn't seen PCP recently, instructed to reestablish care - check Hgb A1c - will try to work on getting back on SGLt2i + GLP1   5. Obesity, morbid - Body mass index is 47.13 kg/m. - previously on Ozempic . No longer taking but would like to restart - w/ OSA, would like to get him on Mounjaro. Will refer to pharmD for GLP1  6. Tobacco Abuse - advised to quit   Follow-up: w/ HF pharmD in 4 wks for further med titration. HF APP in 8 wks. Refer to pharmacy clinic for GLP1  IF EF remains preserved on f/u echo, can [facility] and send to gen cards for ongoing management (Dr. Lonni)   Caffie Shed, PA-C   Caffie Shed, PA-C  1:51 PM

## 2023-08-30 LAB — HEMOGLOBIN A1C
Hgb A1c MFr Bld: 12 % — ABNORMAL HIGH (ref 4.8–5.6)
Mean Plasma Glucose: 298 mg/dL

## 2023-09-03 ENCOUNTER — Other Ambulatory Visit (HOSPITAL_COMMUNITY): Payer: Self-pay

## 2023-09-03 DIAGNOSIS — E118 Type 2 diabetes mellitus with unspecified complications: Secondary | ICD-10-CM

## 2023-09-05 ENCOUNTER — Ambulatory Visit (HOSPITAL_COMMUNITY)
Admission: RE | Admit: 2023-09-05 | Discharge: 2023-09-05 | Disposition: A | Discharge status: Home / Self Care | Source: Ambulatory Visit | Attending: Cardiology | Admitting: Cardiology

## 2023-09-05 DIAGNOSIS — I5032 Chronic diastolic (congestive) heart failure: Secondary | ICD-10-CM | POA: Diagnosis present

## 2023-09-05 LAB — BASIC METABOLIC PANEL WITH GFR
Anion gap: 10 (ref 5–15)
BUN: 13 mg/dL (ref 6–20)
CO2: 29 mmol/L (ref 22–32)
Calcium: 8.9 mg/dL (ref 8.9–10.3)
Chloride: 97 mmol/L — ABNORMAL LOW (ref 98–111)
Creatinine, Ser: 1.04 mg/dL (ref 0.61–1.24)
GFR, Estimated: >60 mL/min (ref >60)
Glucose, Bld: 545 mg/dL (ref 70–99)
Potassium: 3.9 mmol/L (ref 3.5–5.1)
Sodium: 136 mmol/L (ref 135–145)

## 2023-09-05 NOTE — Telephone Encounter (Signed)
 Called patient per Laymon Shed, PA with following lab results and instructions:  Scr and K stable. Let patient know his glucose is dangerously high and his recent Hgb A1c was 12. Instruct him to follow up w/ his PCP so that he can get started back on a diabetes regimen ASAP  Pt verbalized understanding of same. He will call Dr. Candiss office today.

## 2023-09-17 NOTE — Progress Notes (Signed)
 Advanced Heart Failure Clinic Note   PCP: Myrna Camelia HERO, NP Primary Cardiologist: Dr Lonni  HF Cardiologist: Dr. Cherrie  HPI:  [name] is a 58 y.o. with morbid obesity, HTN, OSA, HFiEF, tobacco abuse, and DM2.    Sleep study 10/2019 AHI 80 with desaturations to 68%.    Admitted 01/30/21 with increased dyspnea in the setting of COVID. Echo EF 35-40%.  He was volume overloaded and diuresed with IV Lasix .  CT negative for PE. Started on GDMT. Discharge weight 310 pounds.   R/L Cath 02/16/21 Normal cors. LVEF 45% RA = 12 RV = 44/14 PA = 40/24 (21) PCW = 21 Fick cardiac output/index = 8.0/3.3 PVR = 0.9 WU FA sat = 97% PA sat =  78%, 80% SVC sat = 79%   Echo 10/2021 EF improved to 55-60%, RV mildly reduced.   Presented to APP Clinic 08/29/23. He was for overdue for CHF follow-up. Last seen in clinic in 03/2022.  Stated he had been off all meds x 6 months. Lost his meds when he moved homes. Despite being off meds, he stated he feels great. Denied resting dyspnea but NYHA IIb-III. He is severely obese. Body mass index is 47.13 kg/m. Not compliant w/ CPAP. He was on Ozempic  and would like to restart. He had quit smoking but picked back up. Smoking < 1/4 ppd. He denied CP. BP elevated in clinic 162/102. EKG showed NSR 96 bpm. Overall poor insight.   Echo 08/2023 EF 40-45%, RV normal.  Today he returns to HF clinic for pharmacist medication titration. At last visit with APP, Entresto  49/51 mg BID was restarted. However, medication history completed today and he also restarted other old medications that he found. Full review completed and he was also taking torsemide  20-40 mg daily, metoprolol  succinate 25 mg daily, amlodipine  10 mg daily, rosuvastatin  10 mg daily and Allegra  for allergies. He did have KCL tablets, but was not taking. Stated he had Jardiance  at home, but appropriately not taking. Medication list updated. Overall he is feeling well today. Has tried to make  significant lifestyle changes since last visit. Has started drinking lemon water so he avoids soda. Eating better, trying to avoid fried foods and snacking, and has been exercising by doing yard work. Wants to get back to going to the pool for exercise soon, notes he felt better and had more energy when he used to do this. He is concerned with how elevated his BG have been (A1c now 12, BG on last check 545). Not currently taking anything for BG, but has appointment with Pharmacy Clinic next week on 10/10/23 to restart GLP1-RA. Used to be on Ozempic  and did very well, able to get to target dose. May consider Mounjaro this time (if available through insurance). He is very motivated to restart GLP1-RA to help with weight loss and BG. His PCP is no longer at his current practice. He is planning to go over to his PCP after this visit to establish with a new provider at that office. Also concerned about BP. BP in clinic today 190/108 but had not taking his medications. He takes his BP at home, but only before medications. Has lately had a few values of SBP ~250 on his home machine per his report, typically after exercising. Since restarting medications, sometimes gets dizzy when standing, but this only lasts a few seconds. No CP or palpitations. Able to walk from parking lot to the clinic before getting SOB. Thinks this has  improved from last visit. Has been doing yard work to stay active, but does take a few days to complete since he needs to rest. Weight down 4.6 lbs from last visit. Patient notes at one point he was down to 260 lbs, which is why he is motivated to restart Ozempic . Trace bilateral LEE. No PND or orthopnea.    HF Medications: Metoprolol  succinate 25 mg daily - self-restarted Entresto  49/51 mg BID Torsemide  20-40 mg daily - self-restarted  Has the patient been experiencing any side effects to the medications prescribed?  no  Does the patient have any problems obtaining medications due to  transportation or finances?  [place] Medicaid  Understanding of regimen: good Understanding of indications: fair Potential of compliance: fair Patient understands to avoid NSAIDs. Patient understands to avoid decongestants.    Pertinent Lab Values: Serum creatinine 1.04, BUN 13, Potassium 3.9, Sodium 136,  Vital Signs: Weight: 287.4 lbs (last clinic weight: 292 lbs) Blood pressure: 190/108  Heart rate: 94   Assessment/Plan: 1. Chronic HFiEF - Echo (01/2021): 35-40% LVH mod pericardial effusion. EF previously 55% in 2019.  - Cath (01/2021): Normal cors. EF 45% (suspect HTN in nature). Mildly elevated pressures with high CO - Echo (10/2021): EF improved 55-60%. RV mildly reduced.   Echo 08/2023 EF 40-45%, RV normal. - Reports NYHA IIb-III symptoms. Volume assessment difficult on exam due to body habitus but only note trace LEE.  -Had been off medications for 6 months when last presented to APP Clinic 08/29/23. Have been gradually restarting GDMT - Has been taking torsemide  anywhere from 20 mg - 40 mg daily. Has trace LEE, so will prescribe torsemide  40 mg daily.  - Has been taking metoprolol  succinate 25 mg daily. Will continue. New prescription sent to pharmacy. - Increase Entresto  to 97/103 mg BID.  - Plan to restart eplerenone  at next visit. Had breast tenderness/pain with spironolactone .  -Avoid SGLT2i for now given uncontrolled BG. Can hopefully restart once back stable on GLP1-RA and BG better. Most recent A1C 12%. - stressed importance of maintaining strict compliance w/ meds and f/u    2. Hypertension  - elevated but did not take medications before visit.  - Increase Entresto  to 97/103 mg BID.  -He has been taking an old prescription of amlodipine  10 mg daily at home. Given severely elevated BP, will keep for now (sent new Rx) but may be able to come off as get other HF GDMT uptitrated.  - Asked him to continue checking BP at home daily, but to start checking after he takes his  medications to help with titration. He will bring to next visit.   3. OSA, severe - has CPAP device but noncompliant   - Discussed compliance.  He says he will restart CPAP  - Previously followed with Dr. Burnard. Will need to transition to Dr. Shlomo    4. DM2 - hasn't seen PCP recently, instructed to reestablish care. He plans to go to old office today to see if they can work him in soon (his old PCP has left that office and he will need a new provider) - HgB A1C 12.0% 08/29/23 (up from 6.1 when last checked 2 years ago) - Has appt to restart GLP1 10/10/23. Will start metformin  500 mg daily today (further refills will need to come from PCP). This dose is low and would not expect it to control BG. However, do not want to cloud assessment of GLP1-RA tolerability since both can have GI adverse effects. He used  to be managed effectively on Ozempic  and Jardiance . Will hopefully be able to transition back to that regimen soon.  - He has already made progress on lifestyle changes like eating better and getting exercise. Congratulated and encouraged to continue.      5. Obesity, morbid - Body mass index is 47.13 kg/m. - previously on Ozempic . No longer taking but would like to restart.  - Has appt to restart GLP1 10/10/23  6. Tobacco Abuse - advised to quit   Follow up 2 weeks with Pharmacy Clinic for BP check and labs.    Tinnie Redman, PharmD, BCPS, BCCP, CPP Heart Failure Clinic Pharmacist [phone]

## 2023-09-22 ENCOUNTER — Ambulatory Visit (HOSPITAL_COMMUNITY)
Admission: RE | Admit: 2023-09-22 | Discharge: 2023-09-22 | Disposition: A | Discharge status: Home / Self Care | Source: Ambulatory Visit | Attending: Nurse Practitioner | Admitting: Nurse Practitioner

## 2023-09-22 DIAGNOSIS — I110 Hypertensive heart disease with heart failure: Secondary | ICD-10-CM | POA: Insufficient documentation

## 2023-09-22 DIAGNOSIS — I5032 Chronic diastolic (congestive) heart failure: Secondary | ICD-10-CM | POA: Insufficient documentation

## 2023-09-22 DIAGNOSIS — I3139 Other pericardial effusion (noninflammatory): Secondary | ICD-10-CM | POA: Insufficient documentation

## 2023-09-22 LAB — ECHOCARDIOGRAM COMPLETE
S' Lateral: 4.5 cm
Single Plane A4C EF: 40.5 %

## 2023-09-30 ENCOUNTER — Ambulatory Visit (HOSPITAL_COMMUNITY)
Admission: RE | Admit: 2023-09-30 | Discharge: 2023-09-30 | Disposition: A | Discharge status: Home / Self Care | Source: Ambulatory Visit | Attending: Cardiology | Admitting: Cardiology

## 2023-09-30 VITALS — BP 190/108 | HR 94 | Wt 287.4 lb

## 2023-09-30 DIAGNOSIS — G4733 Obstructive sleep apnea (adult) (pediatric): Secondary | ICD-10-CM | POA: Insufficient documentation

## 2023-09-30 DIAGNOSIS — E669 Obesity, unspecified: Secondary | ICD-10-CM | POA: Insufficient documentation

## 2023-09-30 DIAGNOSIS — Z6841 Body Mass Index (BMI) 40.0 and over, adult: Secondary | ICD-10-CM | POA: Diagnosis not present

## 2023-09-30 DIAGNOSIS — I110 Hypertensive heart disease with heart failure: Secondary | ICD-10-CM | POA: Insufficient documentation

## 2023-09-30 DIAGNOSIS — E119 Type 2 diabetes mellitus without complications: Secondary | ICD-10-CM | POA: Diagnosis not present

## 2023-09-30 DIAGNOSIS — Z79899 Other long term (current) drug therapy: Secondary | ICD-10-CM | POA: Insufficient documentation

## 2023-09-30 DIAGNOSIS — I509 Heart failure, unspecified: Secondary | ICD-10-CM | POA: Diagnosis not present

## 2023-09-30 DIAGNOSIS — F17210 Nicotine dependence, cigarettes, uncomplicated: Secondary | ICD-10-CM | POA: Insufficient documentation

## 2023-09-30 DIAGNOSIS — I5042 Chronic combined systolic (congestive) and diastolic (congestive) heart failure: Secondary | ICD-10-CM

## 2023-09-30 MED ORDER — ENTRESTO 97-103 MG PO TABS: 1.0000 | ORAL_TABLET | Freq: Two times a day (BID) | ORAL | 1 refills | Status: AC

## 2023-09-30 MED ORDER — ROSUVASTATIN CALCIUM 10 MG PO TABS: 10.0000 mg | ORAL_TABLET | Freq: Every day | ORAL | 3 refills | Status: DC

## 2023-09-30 MED ORDER — FEXOFENADINE HCL 180 MG PO TABS: 180.0000 mg | ORAL_TABLET | Freq: Every day | ORAL | Status: AC

## 2023-09-30 MED ORDER — METFORMIN HCL 500 MG PO TABS: 500.0000 mg | ORAL_TABLET | Freq: Every day | ORAL | 1 refills | Status: DC

## 2023-09-30 MED ORDER — TORSEMIDE 20 MG PO TABS: 40.0000 mg | ORAL_TABLET | Freq: Every day | ORAL | 1 refills | Status: AC

## 2023-09-30 MED ORDER — METOPROLOL SUCCINATE ER 25 MG PO TB24: 25.0000 mg | ORAL_TABLET | Freq: Every day | ORAL | 1 refills | Status: DC

## 2023-09-30 MED ORDER — AMLODIPINE BESYLATE 10 MG PO TABS: 10.0000 mg | ORAL_TABLET | Freq: Every day | ORAL | 1 refills | Status: AC

## 2023-09-30 NOTE — Patient Instructions (Signed)
 It was a pleasure seeing you today!  MEDICATIONS: -We are changing your medications today --Increase Entresto  to 97/103 mg (1 tablet) twice daily. You may take 2 tablets of the 49/51 mg strength twice daily until you receive the new strength.   -Start metformin  500 mg (1 tablet) daily. Take with food to prevent stomach upset. Recommend connecting with a new Primary care provider for future changes.  -Call if you have questions about your medications.   NEXT APPOINTMENT: Return to clinic in 2 weeks with Pharmacy Clinic.  In general, to take care of your heart failure: -Limit your fluid intake to 2 Liters (half-gallon) per day.   -Limit your salt intake to ideally 2-3 grams (2000-3000 mg) per day. -Weigh yourself daily and record, and bring that weight diary to your next appointment.  (Weight gain of 2-3 pounds in 1 day typically means fluid weight.) -The medications for your heart are to help your heart and help you live longer.   -Please contact us  before stopping any of your heart medications.  Call the clinic at [phone] with questions or to reschedule future appointments.

## 2023-10-09 ENCOUNTER — Other Ambulatory Visit (HOSPITAL_COMMUNITY): Payer: Self-pay

## 2023-10-09 ENCOUNTER — Telehealth: Payer: Self-pay | Admitting: Pharmacist

## 2023-10-09 ENCOUNTER — Telehealth: Payer: Self-pay | Admitting: Pharmacy Technician

## 2023-10-09 NOTE — Telephone Encounter (Signed)
 Pharmacy Patient Advocate Encounter  Received notification from [facility] that Prior Authorization for mounjaro  has been APPROVED from 10/09/23 to 10/08/24. Ran test claim, Copay is $4.00. This test claim was processed through [facility]- copay amounts may vary at other pharmacies due to pharmacy/plan contracts, or as the patient moves through the different stages of their insurance plan.   PA #/Case ID/Reference #: [id number]

## 2023-10-09 NOTE — Progress Notes (Unsigned)
 Patient ID: Emerson Schreifels                 DOB: 04-06-1965                    MRN: [id number]     HPI: Terence Bart is a 58 y.o. male patient referred to pharmacy clinic by Marcine, PA-C to initiate GLP1-RA therapy. PMH is significant for HTN, OSA, HFpEF, tobacco abuse, and DM2, obesity  Most recent BMI 46.32 kg/m .  Current weight and BMI: 287 lbs  46.32 kg/m  Current meds that affect weight: torsemide   Goal weight: 225 lbs    Diet:   Breakfast: eggs, sausage, potatoes, juice  Lunch: fried fish, salads with little bit of Ranch dressing  Dinner: fried chicken, with some vegetables  Snacks: popcorn, fruit popsicles, Drinks: juice, water, sweet tea   Exercise: none   Family History:  Relation Problem Comments  Mother (Alive) Asthma   Diabetes   Heart disease     Father (Deceased at age 7) Heart disease     Sister (Alive) Asthma     Brother (Deceased at age 28) Heart attack   Heart disease      Social History:  Alcohol: none  Smoking 1/2 pack per day - today he will be seeing PCP for smocking cessation Labs: Lab Results  Component Value Date   HGBA1C 12.0 (H) 08/29/2023    Wt Readings from Last 1 Encounters:  10/10/23 287 lb (130.2 kg)    BP Readings from Last 1 Encounters:  09/30/23 (!) 190/108   Pulse Readings from Last 1 Encounters:  09/30/23 94       Component Value Date/Time   CHOL 166 02/03/2021 0327   CHOL 220 (H) 03/02/2020 1034   TRIG 160 (H) 02/03/2021 0327   HDL 36 (L) 02/03/2021 0327   HDL 36 (L) 03/02/2020 1034   CHOLHDL 4.6 02/03/2021 0327   VLDL 32 02/03/2021 0327   LDLCALC 98 02/03/2021 0327   LDLCALC 154 (H) 03/02/2020 1034    Past Medical History:  Diagnosis Date   Asthma    CHF (congestive heart failure) (HCC)    Hypertension    Sleep apnea    hasn't started CPAP yet    Current Outpatient Medications on File Prior to Visit  Medication Sig Dispense Refill   ENTRESTO  97-103 MG Take 1 tablet by mouth 2 (two) times  daily. 180 tablet 1   fexofenadine  (ALLEGRA  HIVES 24HR) 180 MG tablet Take 1 tablet (180 mg total) by mouth daily.     metFORMIN  (GLUCOPHAGE ) 500 MG tablet Take 1 tablet (500 mg total) by mouth daily with breakfast. 30 tablet 1   metoprolol  succinate (TOPROL  XL) 25 MG 24 hr tablet Take 1 tablet (25 mg total) by mouth daily. 90 tablet 1   amLODipine  (NORVASC ) 10 MG tablet Take 1 tablet (10 mg total) by mouth daily. (Patient not taking: Reported on 10/10/2023) 90 tablet 1   rosuvastatin  (CRESTOR ) 10 MG tablet Take 1 tablet (10 mg total) by mouth daily. 90 tablet 3   torsemide  (DEMADEX ) 20 MG tablet Take 2 tablets (40 mg total) by mouth daily. 180 tablet 1   No current facility-administered medications on file prior to visit.    Allergies  Allergen Reactions   Spironolactone  Other (See Comments)    Breast pain/tenderness     Assessment/Plan:  1. Weight loss -   Problem  Morbid Obesity (Hcc)   Morbid obesity (HCC) Assessment and Plan:  Patient has not met goal of at least 5% of body weight loss with comprehensive lifestyle modifications alone in the past 3-6 months. Last A1c 12 a month ago. he was prescribed metformin  he has not started taking it. Advised pt to start taking metformin  500 mg once a day for 3 days then increase  dose to 500 mg twice daily for 3 days and finally up the dose to 1000 mg twice daily. Along with Metformin  GLP1 agent  is appropriate to pursue as augmentation for weight loss and BG control. Will start Mounjaro  2.5 mg once a week . Confirmed patient not has no personal or family history of medullary thyroid carcinoma (MTC) or Multiple Endocrine Neoplasia syndrome type 2 (MEN 2). Injection technique reviewed at today's visit.  Advised patient on common side effects including nausea, diarrhea, dyspepsia, decreased appetite, and fatigue. Counseled patient on reducing meal size and how to titrate medication to minimize side effects. Counseled patient to call if intolerable  side effects or if experiencing dehydration, abdominal pain, or dizziness. Along with pharmacotherapy, the patient will follow dietary modifications and aim for at least 150 minutes of moderate-intensity exercise per week, plus resistance training twice a week (as recommended by the American Heart Association). This resistance training--such as weightlifting, bodyweight exercises, or using resistance bands, adapted to the patient's ability--will help prevent muscle loss.  Phone follow-ups will be conducted every 4 weeks for dose titration until the patient reaches the effective therapeutic dose and target weight.     Robbi Blanch, Pharm.D [facility]. [facility] [address], [place], [place] [address] Phone: [phone]; Fax: [phone]

## 2023-10-09 NOTE — Telephone Encounter (Signed)
 Per pt calls   Pharmacy Patient Advocate Encounter   Received notification from Pt Calls Messages that prior authorization for MOUNJARO  is required/requested.   Insurance verification completed.   The patient is insured through [facility] .   Per test claim: PA required; PA submitted to above mentioned insurance via Latent Key/confirmation #/EOC [id number] Status is pending

## 2023-10-10 ENCOUNTER — Encounter: Payer: Self-pay | Admitting: Nurse Practitioner

## 2023-10-10 ENCOUNTER — Ambulatory Visit (INDEPENDENT_AMBULATORY_CARE_PROVIDER_SITE_OTHER): Payer: Self-pay | Admitting: Nurse Practitioner

## 2023-10-10 ENCOUNTER — Ambulatory Visit: Attending: Cardiology | Admitting: Pharmacist

## 2023-10-10 ENCOUNTER — Other Ambulatory Visit (HOSPITAL_COMMUNITY): Payer: Self-pay

## 2023-10-10 ENCOUNTER — Encounter: Payer: Self-pay | Admitting: Pharmacist

## 2023-10-10 VITALS — BP 163/100 | HR 97 | Ht 66.0 in | Wt 288.0 lb

## 2023-10-10 DIAGNOSIS — K219 Gastro-esophageal reflux disease without esophagitis: Secondary | ICD-10-CM

## 2023-10-10 DIAGNOSIS — I10 Essential (primary) hypertension: Secondary | ICD-10-CM

## 2023-10-10 DIAGNOSIS — Z6841 Body Mass Index (BMI) 40.0 and over, adult: Secondary | ICD-10-CM

## 2023-10-10 DIAGNOSIS — F17200 Nicotine dependence, unspecified, uncomplicated: Secondary | ICD-10-CM | POA: Diagnosis not present

## 2023-10-10 DIAGNOSIS — Z122 Encounter for screening for malignant neoplasm of respiratory organs: Secondary | ICD-10-CM | POA: Diagnosis not present

## 2023-10-10 DIAGNOSIS — J439 Emphysema, unspecified: Secondary | ICD-10-CM | POA: Insufficient documentation

## 2023-10-10 DIAGNOSIS — E118 Type 2 diabetes mellitus with unspecified complications: Secondary | ICD-10-CM

## 2023-10-10 DIAGNOSIS — H9191 Unspecified hearing loss, right ear: Secondary | ICD-10-CM

## 2023-10-10 DIAGNOSIS — I5042 Chronic combined systolic (congestive) and diastolic (congestive) heart failure: Secondary | ICD-10-CM

## 2023-10-10 DIAGNOSIS — E785 Hyperlipidemia, unspecified: Secondary | ICD-10-CM

## 2023-10-10 MED ORDER — ALBUTEROL SULFATE HFA 108 (90 BASE) MCG/ACT IN AERS
2.0000 | INHALATION_SPRAY | Freq: Four times a day (QID) | RESPIRATORY_TRACT | 2 refills | Status: AC | PRN
Start: 2023-10-10 — End: ?

## 2023-10-10 MED ORDER — MOUNJARO 2.5 MG/0.5ML ~~LOC~~ SOAJ
2.5000 mg | SUBCUTANEOUS | 0 refills | Status: DC
  Filled 2023-10-10: qty 2, 28d supply, fill #0

## 2023-10-10 MED ORDER — PANTOPRAZOLE SODIUM 40 MG PO TBEC
40.0000 mg | DELAYED_RELEASE_TABLET | Freq: Every day | ORAL | 3 refills | Status: AC
Start: 2023-10-10 — End: ?

## 2023-10-10 MED ORDER — BLOOD GLUCOSE MONITORING SUPPL DEVI
1.0000 | Freq: Three times a day (TID) | 0 refills | Status: AC
Start: 2023-10-10 — End: ?

## 2023-10-10 MED ORDER — METFORMIN HCL ER 500 MG PO TB24
500.0000 mg | ORAL_TABLET | Freq: Every day | ORAL | 1 refills | Status: AC
Start: 2023-10-10 — End: ?

## 2023-10-10 MED ORDER — LANCETS MISC. MISC
1.0000 | Freq: Three times a day (TID) | 0 refills | Status: AC
Start: 2023-10-10 — End: 2023-11-09

## 2023-10-10 MED ORDER — LANCET DEVICE MISC
1.0000 | Freq: Three times a day (TID) | 0 refills | Status: AC
Start: 2023-10-10 — End: 2023-11-09

## 2023-10-10 MED ORDER — NICOTINE 21 MG/24HR TD PT24: 21.0000 mg | MEDICATED_PATCH | Freq: Every day | TRANSDERMAL | 0 refills | Status: DC

## 2023-10-10 MED ORDER — BLOOD GLUCOSE TEST VI STRP
1.0000 | ORAL_STRIP | Freq: Three times a day (TID) | 0 refills | Status: AC
Start: 2023-10-10 — End: 2023-11-09

## 2023-10-10 NOTE — Assessment & Plan Note (Signed)
   Emphysema Emphysema with symptoms of shortness of breath and wheezing. No current use of albuterol  inhaler. - Prescribe albuterol  inhaler for use as needed for wheezing or shortness of breath.

## 2023-10-10 NOTE — Assessment & Plan Note (Addendum)
 Assessment and Plan:  Patient has not met goal of at least 5% of body weight loss with comprehensive lifestyle modifications alone in the past 3-6 months. Last A1c 12 a month ago. he was prescribed metformin  he has not started taking it. Advised pt to start taking metformin  500 mg once a day for 3 days then increase  dose to 500 mg twice daily for 3 days and finally up the dose to 1000 mg twice daily. Along with Metformin  GLP1 agent  is appropriate to pursue as augmentation for weight loss and BG control. Will start Mounjaro  2.5 mg once a week . Confirmed patient not has no personal or family history of medullary thyroid carcinoma (MTC) or Multiple Endocrine Neoplasia syndrome type 2 (MEN 2). Injection technique reviewed at today's visit.  Advised patient on common side effects including nausea, diarrhea, dyspepsia, decreased appetite, and fatigue. Counseled patient on reducing meal size and how to titrate medication to minimize side effects. Counseled patient to call if intolerable side effects or if experiencing dehydration, abdominal pain, or dizziness. Along with pharmacotherapy, the patient will follow dietary modifications and aim for at least 150 minutes of moderate-intensity exercise per week, plus resistance training twice a week (as recommended by the American Heart Association). This resistance training--such as weightlifting, bodyweight exercises, or using resistance bands, adapted to the patient's ability--will help prevent muscle loss.  Phone follow-ups will be conducted every 4 weeks for dose titration until the patient reaches the effective therapeutic dose and target weight.

## 2023-10-10 NOTE — Progress Notes (Addendum)
 New Patient Office Visit  Subjective:  Patient ID: Randy Morgan, male    DOB: 09-06-1965  Age: 58 y.o. MRN: [id number]  CC:  Chief Complaint  Patient presents with   Establish Care    HPI Discussed the use of AI scribe software for clinical note transcription with the patient, who gave verbal consent to proceed.  History of Present Illness Randy Morgan is a 58 year old male with hypertension, diabetes, and heart failure who presents for re-establishing care and medication management.  Previous PCP Myrna Salines NP patient is accompanied by his spouse  He is re-establishing care after a long absence since his previous primary care provider left. He has been maintaining follow-up with his cardiologist after a long absence from primary care.  He has a history of hypertension and is currently on multiple antihypertensive medications including Entresto , amlodipine , metoprolol , and torsemide . He has not been taking amlodipine  due to not having it at home. He recently resumed taking Entresto  after a period of non-compliance due to moving and having his medications in storage. He takes metoprolol  25 mg and torsemide  40 mg daily, with a recent increase in torsemide  dosage. He experiences dizziness and shortness of breath.  He is followed by the heart failure team  He was diagnosed with diabetes in August 2025 with an A1c of 12.0%. He has started Mounjaro  2.5 mg , had first injection today and is supposed to be on metformin  500 mg daily, but has not yet picked it up. He is experiencing dietary changes, including reducing sugar and carbohydrates, and increasing vegetable intake. He reports occasional diarrhea, which he associates with his diet.    He has a history of smoking since age 77 and currently smokes about a pack a day. He experiences wheezing and shortness of breath, which he associates with his smoking history. He has previously quit smoking but has since resumed. He reports  being told he has changes to his lungs from smoking and has used an albuterol  inhaler in the past.  He reports using his CPAP machine for over a year but states he sleeps well on it. He used to be active, swimming and walking regularly, but has since stopped.  He reports abdominal pain that started after resuming his medications, particularly in the morning, and describes it as feeling like he ate too much. No nausea or vomiting but reports occasional acid reflux symptoms.  He has hearing issues in his right ear, which have been present for about a year, and he does not hear anything in that ear. No recent earwax buildup.   Assessment & Plan     Past Medical History:  Diagnosis Date   Asthma    CHF (congestive heart failure) (HCC)    Hypertension    Sleep apnea    hasn't started CPAP yet    Past Surgical History:  Procedure Laterality Date   NO PAST SURGERIES     RIGHT/LEFT HEART CATH AND CORONARY ANGIOGRAPHY N/A 02/16/2021   Procedure: RIGHT/LEFT HEART CATH AND CORONARY ANGIOGRAPHY;  Surgeon: Cherrie Toribio SAUNDERS, MD;  Location: [facility];  Service: Cardiovascular;  Laterality: N/A;   VIDEO BRONCHOSCOPY Bilateral 12/09/2017   Procedure: VIDEO BRONCHOSCOPY WITHOUT FLUORO;  Surgeon: Neda Jennet LABOR, MD;  Location: [facility];  Service: Cardiopulmonary;  Laterality: Bilateral;    Family History  Problem Relation Age of Onset   Heart disease Mother    Diabetes Mother    Asthma Mother    Heart  disease Father    Asthma Sister    Heart disease Brother    Heart attack Brother    Colon cancer Neg Hx    Esophageal cancer Neg Hx    Rectal cancer Neg Hx    Stomach cancer Neg Hx     Social History   Socioeconomic History   Marital status: Married    Spouse name: Not on file   Number of children: 2   Years of education: 12th grade + 2 years college   Highest education level: Some college, no degree  Occupational History   Occupation: makes concrete blocks and  bricks   Occupation: Estate agent  Tobacco Use   Smoking status: Every Day    Current packs/day: 0.00    Average packs/day: 0.5 packs/day for 25.0 years (12.5 ttl pk-yrs)    Types: Cigarettes    Start date: 02/26/1996    Last attempt to quit: 02/25/2021    Years since quitting: 2.6   Smokeless tobacco: Never  Vaping Use   Vaping status: Never Used  Substance and Sexual Activity   Alcohol use: Not Currently    Alcohol/week: 2.0 standard drinks of alcohol    Types: 2 Cans of beer per week    Comment: occ beer   Drug use: No   Sexual activity: Not Currently  Other Topics Concern   Not on file  Social History Narrative   Lives with his wife and daughter.   Other child lives independently in [place] .   Drinks about 3 sodas a day       Pt stated he quit smoking   Social Drivers of Corporate investment banker Strain: High Risk (02/01/2021)   Overall Financial Resource Strain (CARDIA)    Difficulty of Paying Living Expenses: Hard  Food Insecurity: No Food Insecurity (02/01/2021)   Hunger Vital Sign    Worried About Running Out of Food in the Last Year: Never true    Ran Out of Food in the Last Year: Never true  Transportation Needs: No Transportation Needs (02/01/2021)   PRAPARE - Administrator, Civil Service (Medical): No    Lack of Transportation (Non-Medical): No  Physical Activity: Not on file  Stress: Not on file  Social Connections: Not on file  Intimate Partner Violence: Not on file    ROS Review of Systems  Constitutional:  Negative for appetite change, chills, fatigue and fever.  HENT:  Negative for congestion, postnasal drip, rhinorrhea and sneezing.   Respiratory:  Positive for shortness of breath. Negative for cough and wheezing.   Cardiovascular:  Negative for chest pain, palpitations and leg swelling.  Gastrointestinal:  Negative for abdominal pain, constipation, nausea and vomiting.  Genitourinary:  Negative for difficulty urinating, dysuria,  flank pain and frequency.  Musculoskeletal:  Negative for arthralgias, back pain, joint swelling and myalgias.  Skin:  Negative for color change, pallor, rash and wound.  Neurological:  Negative for dizziness, facial asymmetry, numbness and headaches.  Psychiatric/Behavioral:  Negative for behavioral problems, confusion, self-injury and suicidal ideas.     Objective:   Today's Vitals: BP (!) 163/100   Pulse 97   Ht 5' 6 (1.676 m)   Wt 288 lb (130.6 kg)   BMI 46.48 kg/m   Physical Exam Vitals and nursing note reviewed.  Constitutional:      General: He is not in acute distress.    Appearance: Normal appearance. He is obese. He is not ill-appearing, toxic-appearing or diaphoretic.  HENT:  Right Ear: Tympanic membrane and ear canal normal. There is no impacted cerumen.     Left Ear: Tympanic membrane, ear canal and external ear normal. There is no impacted cerumen.     Mouth/Throat:     Mouth: Mucous membranes are moist.     Pharynx: Oropharynx is clear. No oropharyngeal exudate or posterior oropharyngeal erythema.  Eyes:     General: No scleral icterus.       Right eye: No discharge.        Left eye: No discharge.     Extraocular Movements: Extraocular movements intact.     Conjunctiva/sclera: Conjunctivae normal.  Cardiovascular:     Rate and Rhythm: Normal rate and regular rhythm.     Pulses: Normal pulses.     Heart sounds: Normal heart sounds. No murmur heard.    No friction rub. No gallop.  Pulmonary:     Effort: Pulmonary effort is normal. No respiratory distress.     Breath sounds: Normal breath sounds. No stridor. No wheezing, rhonchi or rales.  Chest:     Chest wall: No tenderness.  Abdominal:     General: There is no distension.     Palpations: Abdomen is soft.     Tenderness: There is no abdominal tenderness. There is no right CVA tenderness, left CVA tenderness or guarding.  Musculoskeletal:        General: No swelling, tenderness, deformity or signs of  injury.     Right lower leg: No edema.     Left lower leg: No edema.  Skin:    General: Skin is warm and dry.     Capillary Refill: Capillary refill takes less than 2 seconds.     Coloration: Skin is not jaundiced or pale.     Findings: No bruising, erythema or lesion.  Neurological:     Mental Status: He is alert and oriented to person, place, and time.     Motor: No weakness.     Gait: Gait normal.  Psychiatric:        Mood and Affect: Mood normal.        Behavior: Behavior normal.        Thought Content: Thought content normal.        Judgment: Judgment normal.     Assessment & Plan:   Problem List Items Addressed This Visit       Cardiovascular and Mediastinum   Essential hypertension, benign   BP Readings from Last 3 Encounters:  10/10/23 (!) 163/100  09/30/23 (!) 190/108  08/29/23 (!) 162/102   - Encourage adherence to prescribed medication regimen. On amlodipine  10 mg daily, spironolactone  25 mg daily, torsemide  40 mg daily metoprolol  25 mg daily, Entresto  97-103 mg 1 tablet twice daily Maintain close follow-up with the cardiology      Chronic combined systolic and diastolic CHF (congestive heart failure) (HCC)    Chronic heart failure with symptoms of shortness of breath and dizziness.  - Encourage adherence to prescribed medication regimen. Continue amlodipine  10 mg daily, spironolactone  25 mg daily, torsemide  40 mg daily metoprolol  25 mg daily, Entresto  97-103 mg 1 tablet twice daily On fluid restriction of 1500 mL Maintain close follow-up with the cardiology        Respiratory   Pulmonary emphysema (HCC)     Emphysema Emphysema with symptoms of shortness of breath and wheezing. No current use of albuterol  inhaler. - Prescribe albuterol  inhaler for use as needed for wheezing or shortness of breath.  Relevant Medications   nicotine  (NICODERM CQ  - DOSED IN MG/24 HOURS) 21 mg/24hr patch   albuterol  (VENTOLIN  HFA) 108 (90 Base) MCG/ACT inhaler      Endocrine   Type 2 diabetes mellitus with complication, without long-term current use of insulin (HCC) - Primary   Lab Results  Component Value Date   HGBA1C 12.0 (H) 08/29/2023   Type 2 diabetes mellitus Recently diagnosed with an A1c of 12.0%. Discussed importance of reducing A1c to below 7 to decrease risk of stroke and heart attack. - Switch metformin  to extended-release formulation. - Start metformin  500 mg daily for one week, then increase to 500 mg twice daily if tolerated. - Continue Mounjaro  2.5 mg weekly monitor for abdominal pain, nausea, or vomiting.  After 4 weeks start Mounjaro  5 mg once weekly - Order glucometer for home blood sugar monitoring. - Encourage dietary modifications to reduce sugar and carbohydrate intake. - Encourage physical activity, starting with 10 minutes daily, increasing gradually.       Relevant Medications   Blood Glucose Monitoring Suppl DEVI   Glucose Blood (BLOOD GLUCOSE TEST STRIPS) STRP   Lancet Device MISC   Lancets Misc. MISC   metFORMIN  (GLUCOPHAGE -XR) 500 MG 24 hr tablet   Other Relevant Orders   Ambulatory referral to Ophthalmology     Other   BMI 45.0-49.9, adult (HCC)   Wt Readings from Last 3 Encounters:  10/10/23 288 lb (130.6 kg)  10/10/23 287 lb (130.2 kg)  09/30/23 287 lb 6.4 oz (130.4 kg)   Body mass index is 46.48 kg/m.  SABRA Advised weight loss to improve overall health and manage comorbid conditions. - Encourage dietary modifications focusing on vegetables and protein. - Encourage physical activity, starting with 10 minutes daily, increasing gradually. Continue Mounjaro       Relevant Medications   metFORMIN  (GLUCOPHAGE -XR) 500 MG 24 hr tablet   Smoker   Smokes about 1 pack/day  Asked about quitting: confirms that he/she currently smokes cigarettes Advise to quit smoking: Educated about QUITTING to reduce the risk of cancer, cardio and cerebrovascular disease. Assess willingness: willing to quit at this time, Assist  with counseling and pharmacotherapy: Counseled for 5 minutes and literature provided. Arrange for follow up: follow up in 4 weeks and continue to offer help.   - Prescribe nicotine  patch to aid smoking cessation. Low-dose chest CT scan ordered to screen for lung cancer      Relevant Medications   nicotine  (NICODERM CQ  - DOSED IN MG/24 HOURS) 21 mg/24hr patch   Dyslipidemia, goal LDL below 70   Lab Results  Component Value Date   CHOL 166 02/03/2021   HDL 36 (L) 02/03/2021   LDLCALC 98 02/03/2021   TRIG 160 (H) 02/03/2021   CHOLHDL 4.6 02/03/2021    Hyperlipidemia Cholesterol levels need management with dietary changes. Goal is to reduce cholesterol to less than 70. - Encourage dietary modifications to reduce cholesterol intake. Lipid panel at next visit      Hearing impaired person, right   Hearing loss, right ear Hearing loss in the right ear for about a year. No wax buildup noted. - Refer to ENT for evaluation of right ear hearing loss.      Relevant Orders   Ambulatory referral to ENT   Other Visit Diagnoses       Screening for lung cancer       Relevant Orders   CT CHEST LUNG CA SCREEN LOW DOSE W/O CM     Gastroesophageal reflux disease, unspecified  whether esophagitis present       Relevant Medications   pantoprazole  (PROTONIX ) 40 MG tablet       Outpatient Encounter Medications as of 10/10/2023  Medication Sig   albuterol  (VENTOLIN  HFA) 108 (90 Base) MCG/ACT inhaler Inhale 2 puffs into the lungs every 6 (six) hours as needed for wheezing or shortness of breath.   Blood Glucose Monitoring Suppl DEVI 1 each by Does not apply route in the morning, at noon, and at bedtime. May substitute to any manufacturer covered by patient's insurance.   ENTRESTO  97-103 MG Take 1 tablet by mouth 2 (two) times daily.   Glucose Blood (BLOOD GLUCOSE TEST STRIPS) STRP 1 each by In Vitro route in the morning, at noon, and at bedtime. May substitute to any manufacturer covered by  patient's insurance.   Lancet Device MISC 1 each by Does not apply route in the morning, at noon, and at bedtime. May substitute to any manufacturer covered by patient's insurance.   Lancets Misc. MISC 1 each by Does not apply route in the morning, at noon, and at bedtime. May substitute to any manufacturer covered by patient's insurance.   metFORMIN  (GLUCOPHAGE -XR) 500 MG 24 hr tablet Take 1 tablet (500 mg total) by mouth daily with breakfast.   metoprolol  succinate (TOPROL  XL) 25 MG 24 hr tablet Take 1 tablet (25 mg total) by mouth daily.   nicotine  (NICODERM CQ  - DOSED IN MG/24 HOURS) 21 mg/24hr patch Place 1 patch (21 mg total) onto the skin daily.   pantoprazole  (PROTONIX ) 40 MG tablet Take 1 tablet (40 mg total) by mouth daily.   tirzepatide  (MOUNJARO ) 2.5 MG/0.5ML Pen Inject 2.5 mg into the skin once a week.   torsemide  (DEMADEX ) 20 MG tablet Take 2 tablets (40 mg total) by mouth daily.   [DISCONTINUED] metFORMIN  (GLUCOPHAGE ) 500 MG tablet Take 1 tablet (500 mg total) by mouth daily with breakfast.   amLODipine  (NORVASC ) 10 MG tablet Take 1 tablet (10 mg total) by mouth daily. (Patient not taking: Reported on 10/10/2023)   fexofenadine  (ALLEGRA  HIVES 24HR) 180 MG tablet Take 1 tablet (180 mg total) by mouth daily. (Patient not taking: Reported on 10/10/2023)   rosuvastatin  (CRESTOR ) 10 MG tablet Take 1 tablet (10 mg total) by mouth daily. (Patient not taking: Reported on 10/10/2023)   No facility-administered encounter medications on file as of 10/10/2023.    Follow-up: Return in about 5 weeks (around 11/14/2023) for DM, HTN, HYPERLIPIDEMIA.   Deziree Mokry R Kanoa Phillippi, FNP

## 2023-10-10 NOTE — Patient Instructions (Addendum)
 Goal for fasting blood sugar ranges from 80 to 120 and 2 hours after any meal or at bedtime should be between 130 to 170.    1. Type 2 diabetes mellitus with complication, without long-term current use of insulin (HCC) (Primary)  - Blood Glucose Monitoring Suppl DEVI; 1 each by Does not apply route in the morning, at noon, and at bedtime. May substitute to any manufacturer covered by patient's insurance.  Dispense: 1 each; Refill: 0 - Glucose Blood (BLOOD GLUCOSE TEST STRIPS) STRP; 1 each by In Vitro route in the morning, at noon, and at bedtime. May substitute to any manufacturer covered by patient's insurance.  Dispense: 100 strip; Refill: 0 - Lancet Device MISC; 1 each by Does not apply route in the morning, at noon, and at bedtime. May substitute to any manufacturer covered by patient's insurance.  Dispense: 1 each; Refill: 0 - Lancets Misc. MISC; 1 each by Does not apply route in the morning, at noon, and at bedtime. May substitute to any manufacturer covered by patient's insurance.  Dispense: 100 each; Refill: 0 - Ambulatory referral to Ophthalmology - metFORMIN  (GLUCOPHAGE -XR) 500 MG 24 hr tablet; Take 1 tablet (500 mg total) by mouth daily with breakfast.  Dispense: 90 tablet; Refill: 1  2. Hearing impaired person, right  - Ambulatory referral to ENT  3. Smoker  - nicotine  (NICODERM CQ  - DOSED IN MG/24 HOURS) 21 mg/24hr patch; Place 1 patch (21 mg total) onto the skin daily.  Dispense: 42 patch; Refill: 0  4. Screening for lung cancer  - CT CHEST LUNG CA SCREEN LOW DOSE W/O CM; Future  5. Pulmonary emphysema, unspecified emphysema type (HCC)  - albuterol  (VENTOLIN  HFA) 108 (90 Base) MCG/ACT inhaler; Inhale 2 puffs into the lungs every 6 (six) hours as needed for wheezing or shortness of breath.  Dispense: 8 g; Refill: 2    It is important that you exercise regularly at least 30 minutes 5 times a week as tolerated  Think about what you will eat, plan ahead. Choose  clean,  green, fresh or frozen over canned, processed or packaged foods which are more sugary, salty and fatty. 70 to 75% of food eaten should be vegetables and fruit. Three meals at set times with snacks allowed between meals, but they must be fruit or vegetables. Aim to eat over a 12 hour period , example 7 am to 7 pm, and STOP after  your last meal of the day. Drink water,generally about 64 ounces per day, no other drink is as healthy. Fruit juice is best enjoyed in a healthy way, by EATING the fruit.  Thanks for choosing [facility] we consider it a privelige to serve you.

## 2023-10-10 NOTE — Assessment & Plan Note (Addendum)
 BP Readings from Last 3 Encounters:  10/10/23 (!) 163/100  09/30/23 (!) 190/108  08/29/23 (!) 162/102   - Encourage adherence to prescribed medication regimen. On amlodipine  10 mg daily, spironolactone  25 mg daily, torsemide  40 mg daily metoprolol  25 mg daily, Entresto  97-103 mg 1 tablet twice daily Maintain close follow-up with the cardiology

## 2023-10-10 NOTE — Assessment & Plan Note (Addendum)
  Chronic heart failure with symptoms of shortness of breath and dizziness.  - Encourage adherence to prescribed medication regimen. Continue amlodipine  10 mg daily, spironolactone  25 mg daily, torsemide  40 mg daily metoprolol  25 mg daily, Entresto  97-103 mg 1 tablet twice daily On fluid restriction of 1500 mL Maintain close follow-up with the cardiology

## 2023-10-10 NOTE — Assessment & Plan Note (Signed)
 Wt Readings from Last 3 Encounters:  10/10/23 288 lb (130.6 kg)  10/10/23 287 lb (130.2 kg)  09/30/23 287 lb 6.4 oz (130.4 kg)   Body mass index is 46.48 kg/m.  SABRA Advised weight loss to improve overall health and manage comorbid conditions. - Encourage dietary modifications focusing on vegetables and protein. - Encourage physical activity, starting with 10 minutes daily, increasing gradually. Continue Mounjaro 

## 2023-10-10 NOTE — Assessment & Plan Note (Addendum)
 Lab Results  Component Value Date   HGBA1C 12.0 (H) 08/29/2023   Type 2 diabetes mellitus Recently diagnosed with an A1c of 12.0%. Discussed importance of reducing A1c to below 7 to decrease risk of stroke and heart attack. - Switch metformin  to extended-release formulation. - Start metformin  500 mg daily for one week, then increase to 500 mg twice daily if tolerated. - Continue Mounjaro  2.5 mg weekly monitor for abdominal pain, nausea, or vomiting.  After 4 weeks start Mounjaro  5 mg once weekly - Order glucometer for home blood sugar monitoring. - Encourage dietary modifications to reduce sugar and carbohydrate intake. - Encourage physical activity, starting with 10 minutes daily, increasing gradually.

## 2023-10-10 NOTE — Assessment & Plan Note (Signed)
 Smokes about 1 pack/day  Asked about quitting: confirms that he/she currently smokes cigarettes Advise to quit smoking: Educated about QUITTING to reduce the risk of cancer, cardio and cerebrovascular disease. Assess willingness: willing to quit at this time, Assist with counseling and pharmacotherapy: Counseled for 5 minutes and literature provided. Arrange for follow up: follow up in 4 weeks and continue to offer help.   - Prescribe nicotine  patch to aid smoking cessation. Low-dose chest CT scan ordered to screen for lung cancer

## 2023-10-10 NOTE — Patient Instructions (Signed)
 GLP1 Agonist Titration Plan:  Will plan to follow the titration plan as below, pending patient is tolerating each dose before increasing to the next. Can slow titration if needed for tolerability.      Mounjaro : -Month 1: Inject Mounjaro  2.5 mg SQ once weekly x 4 weeks -Month 2: Inject Mounjaro  5 mg SQ once weekly x 4 weeks -Month 3: Inject Mounjaro  7.5 mg SQ once weekly x 4 weeks -Month 4: Inject Mounjaro  10 mg SQ once weekly X 4 weeks -Month 5: Inject Mounjaro  12.5 mg mg SQ once weekly x 4 weeks -Month 6+: Inject Mounjaro  15 mg  SQ once weekly

## 2023-10-10 NOTE — Assessment & Plan Note (Signed)
 Hearing loss, right ear Hearing loss in the right ear for about a year. No wax buildup noted. - Refer to ENT for evaluation of right ear hearing loss.

## 2023-10-10 NOTE — Assessment & Plan Note (Signed)
 Lab Results  Component Value Date   CHOL 166 02/03/2021   HDL 36 (L) 02/03/2021   LDLCALC 98 02/03/2021   TRIG 160 (H) 02/03/2021   CHOLHDL 4.6 02/03/2021    Hyperlipidemia Cholesterol levels need management with dietary changes. Goal is to reduce cholesterol to less than 70. - Encourage dietary modifications to reduce cholesterol intake. Lipid panel at next visit

## 2023-10-12 NOTE — Progress Notes (Incomplete)
 ***In Progress***    Advanced Heart Failure Clinic Note   PCP: Randy Camelia HERO, NP Primary Cardiologist: Dr Lonni  HF Cardiologist: Dr. Cherrie  HPI:  Randy Morgan is a 58 y.o. with morbid obesity, HTN, OSA, HFiEF, tobacco abuse, and DM2.    Sleep study 10/2019 AHI 80 with desaturations to 68%.    Admitted 01/30/21 with increased dyspnea in the setting of COVID. Echo EF 35-40%.  He was volume overloaded and diuresed with IV Lasix .  CT negative for PE. Started on GDMT. Discharge weight 310 pounds.   R/L Cath 02/16/21 Normal cors. LVEF 45% RA = 12 RV = 44/14 PA = 40/24 (21) PCW = 21 Fick cardiac output/index = 8.0/3.3 PVR = 0.9 WU FA sat = 97% PA sat =  78%, 80% SVC sat = 79%   Echo 10/2021 EF improved to 55-60%, RV mildly reduced.   [facility]. He was for overdue for CHF follow-up. Last seen in clinic in 03/2022.  Stated he had been off all meds x 6 months. Lost his meds when he moved homes. Despite being off meds, he stated he feels great. Denied resting dyspnea but NYHA IIb-III. He is severely obese. Body mass index is 47.13 kg/m. Not compliant w/ CPAP. He was on Ozempic  and would like to restart. He had quit smoking but picked back up. Smoking < 1/4 ppd. He denied CP. BP elevated in clinic 162/102. EKG showed NSR 96 bpm. Overall poor insight.    Echo 08/2023 EF 40-45%, RV normal.  He returned to HF clinic for pharmacist medication titration on 09/30/23. Patient reported self-starting old medications he had found, medication list updated appropriately. Patient had started making significant lifestyle changes, including eating better and exercising more. Patient was concerned about his elevated BG, was not taking anything for BG at the time of this visit. Patient also concerned about his BP. BP in clinic was 190/108 mmHg. Had a few values on SBP ~250 mmHg on his home machine per his report, typically after exercising. Reported occasional dizziness since  restarting his medications.  Patient was able to walk from parking lot to the clinic before getting SOB, thought it was an improvement since prior visits. Weight was down 4.6 lbs from prior visit. Patient reported weight was down ton 260 lbs at one point, was motivated to restart a GLP1-RA. Trace bilateral LEE noted, no PND or orthopnea.   Today he returns to HF clinic for pharmacist medication titration. At last pharmacy clinic visit, patient was instructed to continue taking torsemide  40 mg daily, metoprolol  succinate 25 mg daily, and Entresto  97/103 mg BID. ***  Shortness of breath/dyspnea on exertion? {YES NO:22349}  Orthopnea/PND? {YES NO:22349} Edema? {YES NO:22349} Lightheadedness/dizziness? {YES NO:22349} Daily weights at home? {YES NO:22349} Blood pressure/heart rate monitoring at home? {YES [id number] Following low-sodium/fluid-restricted diet? {YES NO:22349}  HF Medications: Metoprolol  succinate 25 mg daily *** Entresto  97/103 mg BID *** Torsemide  40 mg daily ***  Has the patient been experiencing any side effects to the medications prescribed?  {YES NO:22349}  Does the patient have any problems obtaining medications due to transportation or finances?   {YES NO:22349}  Understanding of regimen: {excellent/good/fair/poor:19665} Understanding of indications: {excellent/good/fair/poor:19665} Potential of compliance: {excellent/good/fair/poor:19665} Patient understands to avoid NSAIDs. Patient understands to avoid decongestants.    Pertinent Lab Values: (08/29/23) Serum creatinine 0.76 mg/dL, BUN 10 mg/dL, Potassium 3.7 mmol/L, Sodium 135 mmol/L, BNP 311 pg/mL  Vital Signs: Weight: *** (last clinic weight: 287 lbs) Blood pressure: *** (  last clinic BP: 190/108 mmHg) *** Heart rate: *** (last clinic HR: 94 bpm) ****  Assessment/Plan: 1. Chronic HFiEF - Echo (01/2021): 35-40% LVH mod pericardial effusion. EF previously 55% in 2019.  - Cath (01/2021): Normal cors. EF 45% (suspect  HTN in nature). Mildly elevated pressures with high CO - Echo (10/2021): EF improved 55-60%. RV mildly reduced.   Echo 08/2023 EF 40-45%, RV normal. - Reports NYHA IIb-III symptoms. Volume assessment difficult on exam due to body habitus but only note trace LEE.  -Had been off medications for 6 months when last presented to [facility] 08/29/23. Have been gradually restarting GDMT - Has been taking torsemide  anywhere from 20 mg - 40 mg daily. Has trace LEE, so will prescribe torsemide  40 mg daily.  *** - Has been taking metoprolol  succinate 25 mg daily. Will continue. New prescription sent to pharmacy. *** - Increase Entresto  to 97/103 mg BID.  *** - Plan to restart eplerenone  at next visit. Had breast tenderness/pain with spironolactone .  *** -Avoid SGLT2i for now given uncontrolled BG. Can hopefully restart once back stable on GLP1-RA and BG better. Most recent A1C 12%. *** - stressed importance of maintaining strict compliance w/ meds and f/u    2. Hypertension  - elevated but did not take medications before visit.  *** - Increase Entresto  to 97/103 mg BID.  *** -He has been taking an old prescription of amlodipine  10 mg daily at home. Given severely elevated BP, will keep for now (sent new Rx) but may be able to come off as get other HF GDMT uptitrated.  - Asked him to continue checking BP at home daily, but to start checking after he takes his medications to help with titration. He will bring to next visit. ***   3. OSA, severe - has CPAP device but noncompliant   - Discussed compliance.  He says he will restart CPAP  - Previously followed with Dr. Burnard. Will need to transition to Dr. Shlomo    4. DM2 *** - hasn't seen PCP recently, instructed to reestablish care. He plans to go to old office today to see if they can work him in soon (his old PCP has left that office and he will need a new provider) - HgB A1C 12.0% 08/29/23 (up from 6.1 when last checked 2 years ago) - Has appt to restart  GLP1 10/10/23. Will start metformin  500 mg daily today (further refills will need to come from PCP). This dose is low and would not expect it to control BG. However, do not want to cloud assessment of GLP1-RA tolerability since both can have GI adverse effects. He used to be managed effectively on Ozempic  and Jardiance . Will hopefully be able to transition back to that regimen soon.  - He has already made progress on lifestyle changes like eating better and getting exercise. Congratulated and encouraged to continue.      5. Obesity, morbid *** - Body mass index is 47.13 kg/m. *** - previously on Ozempic . No longer taking but would like to restart.  - Has appt to restart GLP1 10/10/23 ***   6. Tobacco Abuse - advised to quit   Follow up ***  Morna Breach, PharmD PGY2 Cardiology Pharmacy Resident

## 2023-10-13 ENCOUNTER — Inpatient Hospital Stay (HOSPITAL_COMMUNITY): Admission: RE | Admit: 2023-10-13 | Source: Ambulatory Visit

## 2023-10-15 NOTE — Telephone Encounter (Signed)
 Prescription for Mounjaro  sent to preferred pharmacy. See other encounter for more info.

## 2023-10-18 NOTE — Progress Notes (Incomplete)
 ***In Progress***    Advanced Heart Failure Clinic Note   PCP: Randy Camelia HERO, Randy Morgan Primary Cardiologist: Dr Randy Morgan  HF Cardiologist: Dr. Cherrie  HPI:  [name] is a 58 y.o. with morbid obesity, HTN, OSA, HFiEF, tobacco abuse, and DM2.    Sleep study 10/2019 AHI 80 with desaturations to 68%.    Admitted 01/30/21 with increased dyspnea in the setting of COVID. Echo EF 35-40%.  He was volume overloaded and diuresed with IV Lasix .  CT negative for PE. Started on GDMT. Discharge weight 310 pounds.   R/L Cath 02/16/21 Normal cors. LVEF 45% RA = 12 RV = 44/14 PA = 40/24 (21) PCW = 21 Fick cardiac output/index = 8.0/3.3 PVR = 0.9 WU FA sat = 97% PA sat =  78%, 80% SVC sat = 79%   Echo 10/2021 EF improved to 55-60%, RV mildly reduced.   [facility]. He was for overdue for CHF follow-up. Last seen in clinic in 03/2022.  Stated he had been off all meds x 6 months. Lost his meds when he moved homes. Despite being off meds, he stated he feels great. Denied resting dyspnea but NYHA IIb-III. He is severely obese. Body mass index is 47.13 kg/m. Not compliant w/ CPAP. He was on Ozempic  and would like to restart. He had quit smoking but picked back up. Smoking < 1/4 ppd. He denied CP. BP elevated in clinic 162/102. EKG showed NSR 96 bpm. Overall poor insight.    Echo 08/2023 EF 40-45%, RV normal.  He returned to HF clinic for pharmacist medication titration on 09/30/23. Patient reported self-starting old medications he had found, medication list updated appropriately. Patient had started making significant lifestyle changes, including eating better and exercising more. Patient was concerned about his elevated BG, was not taking anything for BG at the time of this visit. Patient also concerned about his BP. BP in clinic was 190/108 mmHg. Had a few values on SBP ~250 mmHg on his home machine per his report, typically after exercising. Reported occasional dizziness since  restarting his medications.  Patient was able to walk from parking lot to the clinic before getting SOB, thought it was an improvement since prior visits. Weight was down 4.6 lbs from prior visit. Patient reported weight was down ton 260 lbs at one point, was motivated to restart a GLP1-RA. Trace bilateral LEE noted, no PND or orthopnea.   Today he returns to HF clinic for pharmacist medication titration. At last pharmacy clinic visit, patient was instructed to continue taking torsemide  40 mg daily, metoprolol  succinate 25 mg daily, and Entresto  97/103 mg BID. ***  Shortness of breath/dyspnea on exertion? {YES NO:22349}  Orthopnea/PND? {YES NO:22349} Edema? {YES NO:22349} Lightheadedness/dizziness? {YES NO:22349} Daily weights at home? {YES NO:22349} Blood pressure/heart rate monitoring at home? {YES [id number] Following low-sodium/fluid-restricted diet? {YES NO:22349}  HF Medications: Metoprolol  succinate 25 mg daily *** Entresto  97/103 mg BID *** Torsemide  40 mg daily ***  Has the patient been experiencing any side effects to the medications prescribed?  {YES NO:22349}  Does the patient have any problems obtaining medications due to transportation or finances?   {YES NO:22349}  Understanding of regimen: {excellent/good/fair/poor:19665} Understanding of indications: {excellent/good/fair/poor:19665} Potential of compliance: {excellent/good/fair/poor:19665} Patient understands to avoid NSAIDs. Patient understands to avoid decongestants.    Pertinent Lab Values: (08/29/23) Serum creatinine 0.76 mg/dL, BUN 10 mg/dL, Potassium 3.7 mmol/L, Sodium 135 mmol/L, BNP 311 pg/mL  Vital Signs: Weight: *** (last clinic weight: 287 lbs) Blood pressure: *** (  last clinic BP: 190/108 mmHg) *** Heart rate: *** (last clinic HR: 94 bpm) ****  Assessment/Plan: 1. Chronic HFiEF - Echo (01/2021): 35-40% LVH mod pericardial effusion. EF previously 55% in 2019.  - Cath (01/2021): Normal cors. EF 45% (suspect  HTN in nature). Mildly elevated pressures with high CO - Echo (10/2021): EF improved 55-60%. RV mildly reduced.   Echo 08/2023 EF 40-45%, RV normal. - Reports NYHA IIb-III symptoms. Volume assessment difficult on exam due to body habitus but only note trace LEE.  -Had been off medications for 6 months when last presented to [facility] 08/29/23. Have been gradually restarting GDMT - Has been taking torsemide  anywhere from 20 mg - 40 mg daily. Has trace LEE, so will prescribe torsemide  40 mg daily.  *** - Has been taking metoprolol  succinate 25 mg daily. Will continue. New prescription sent to pharmacy. *** - Increase Entresto  to 97/103 mg BID.  *** - Plan to restart eplerenone  at next visit. Had breast tenderness/pain with spironolactone .  *** -Avoid SGLT2i for now given uncontrolled BG. Can hopefully restart once back stable on GLP1-RA and BG better. Most recent A1C 12%. *** - stressed importance of maintaining strict compliance w/ meds and f/u    2. Hypertension  - elevated but did not take medications before visit.  *** - Increase Entresto  to 97/103 mg BID.  *** -He has been taking an old prescription of amlodipine  10 mg daily at home. Given severely elevated BP, will keep for now (sent new Rx) but may be able to come off as get other HF GDMT uptitrated.  - Asked him to continue checking BP at home daily, but to start checking after he takes his medications to help with titration. He will bring to next visit. ***   3. OSA, severe - has CPAP device but noncompliant   - Discussed compliance.  He says he will restart CPAP  - Previously followed with Dr. Burnard. Will need to transition to Dr. Shlomo    4. DM2 *** - hasn't seen PCP recently, instructed to reestablish care. He plans to go to old office today to see if they can work him in soon (his old PCP has left that office and he will need a new provider) - HgB A1C 12.0% 08/29/23 (up from 6.1 when last checked 2 years ago) - Has appt to restart  GLP1 10/10/23. Will start metformin  500 mg daily today (further refills will need to come from PCP). This dose is low and would not expect it to control BG. However, do not want to cloud assessment of GLP1-RA tolerability since both can have GI adverse effects. He used to be managed effectively on Ozempic  and Jardiance . Will hopefully be able to transition back to that regimen soon.  - He has already made progress on lifestyle changes like eating better and getting exercise. Congratulated and encouraged to continue.      5. Obesity, morbid *** - Body mass index is 47.13 kg/m. *** - previously on Ozempic . No longer taking but would like to restart.  - Has appt to restart GLP1 10/10/23 ***   6. Tobacco Abuse - advised to quit   Follow up ***  Morna Breach, PharmD PGY2 Cardiology Pharmacy Resident

## 2023-10-20 ENCOUNTER — Ambulatory Visit (HOSPITAL_COMMUNITY)
Admission: RE | Admit: 2023-10-20 | Discharge: 2023-10-20 | Disposition: A | Discharge status: Home / Self Care | Source: Ambulatory Visit | Attending: Cardiology | Admitting: Cardiology

## 2023-10-20 VITALS — BP 158/84 | HR 96 | Wt 284.6 lb

## 2023-10-20 DIAGNOSIS — Z6841 Body Mass Index (BMI) 40.0 and over, adult: Secondary | ICD-10-CM | POA: Diagnosis not present

## 2023-10-20 DIAGNOSIS — I5042 Chronic combined systolic (congestive) and diastolic (congestive) heart failure: Secondary | ICD-10-CM | POA: Diagnosis not present

## 2023-10-20 DIAGNOSIS — E119 Type 2 diabetes mellitus without complications: Secondary | ICD-10-CM | POA: Insufficient documentation

## 2023-10-20 DIAGNOSIS — I5032 Chronic diastolic (congestive) heart failure: Secondary | ICD-10-CM | POA: Insufficient documentation

## 2023-10-20 DIAGNOSIS — F17290 Nicotine dependence, other tobacco product, uncomplicated: Secondary | ICD-10-CM | POA: Diagnosis not present

## 2023-10-20 DIAGNOSIS — I110 Hypertensive heart disease with heart failure: Secondary | ICD-10-CM | POA: Diagnosis not present

## 2023-10-20 DIAGNOSIS — G4733 Obstructive sleep apnea (adult) (pediatric): Secondary | ICD-10-CM | POA: Diagnosis not present

## 2023-10-20 DIAGNOSIS — Z79899 Other long term (current) drug therapy: Secondary | ICD-10-CM | POA: Insufficient documentation

## 2023-10-20 MED ORDER — EPLERENONE 25 MG PO TABS: 25.0000 mg | ORAL_TABLET | Freq: Every day | ORAL | 11 refills | Status: DC

## 2023-10-20 NOTE — Patient Instructions (Addendum)
 It was a pleasure seeing you today!  MEDICATIONS: -We are changing your medications today -Increase Entresto  97/103 mg (1 tablet) two times daily -Start eplerenone  25 mg daily -Call if you have questions about your medications.  LABS: -We will call you if your labs need attention.  NEXT APPOINTMENT: Return to AHF clinic on September 30th for follow-up  In general, to take care of your heart failure: -Limit your fluid intake to 2 Liters (half-gallon) per day.   -Limit your salt intake to ideally 2-3 grams (2000-3000 mg) per day. -Weigh yourself daily and record, and bring that weight diary to your next appointment.  (Weight gain of 2-3 pounds in 1 day typically means fluid weight.) -The medications for your heart are to help your heart and help you live longer.   -Please contact us  before stopping any of your heart medications.  Call the clinic at [phone] with questions or to reschedule future appointments.

## 2023-10-20 NOTE — Progress Notes (Signed)
 Advanced Heart Failure Clinic Note    PCP: Myrna Camelia HERO, NP Primary Cardiologist: Dr Lonni  HF Cardiologist: Dr. Cherrie   HPI:  Mr Schurman is a 58 y.o. with morbid obesity, HTN, OSA, HFiEF, tobacco abuse, and DM2.    Sleep study 10/2019 AHI 80 with desaturations to 68%.    Admitted 01/30/21 with increased dyspnea in the setting of COVID. Echo EF 35-40%.  He was volume overloaded and diuresed with IV Lasix .  CT negative for PE. Started on GDMT. Discharge weight 310 pounds.   R/L Cath 02/16/21 Normal cors. LVEF 45% RA = 12 RV = 44/14 PA = 40/24 (21) PCW = 21 Fick cardiac output/index = 8.0/3.3 PVR = 0.9 WU FA sat = 97% PA sat =  78%, 80% SVC sat = 79%   Echo 10/2021 EF improved to 55-60%, RV mildly reduced.   [facility] to [facility] 08[facility]29[facility]. He was for overdue for CHF follow-up. Last seen in clinic in 03/2022.  Stated he had been off all meds x 6 months. Lost his meds when he moved homes. Despite being off meds, he stated he feels great. Denied resting dyspnea but NYHA IIb-III. He is severely obese. Body mass index is 47.13 kg/m. Not compliant w/ CPAP. He was on Ozempic  and would like to restart. He had quit smoking but picked back up. Smoking < 1/4 ppd. He denied CP. BP elevated in clinic 162/102. EKG showed NSR 96 bpm. Overall poor insight.    Echo 08/2023 EF 40-45%, RV normal.   He returned to HF clinic for pharmacist medication titration on 09/30/23. Patient reported self-starting old medications he had found, medication list updated appropriately. Patient had started making significant lifestyle changes, including eating better and exercising more. Patient was concerned about his elevated BG, was not taking anything for BG at the time of this visit. Patient also concerned about his BP. BP in clinic was 190/108 mmHg. Had a few values on SBP ~250 mmHg on his home machine per his report, typically after exercising. Reported occasional dizziness since restarting his  medications.  Patient was able to walk from parking lot to the clinic before getting SOB, thought it was an improvement since prior visits. Weight was down 4.6 lbs from prior visit. Patient reported weight was down ton 260 lbs at one point, was motivated to restart a GLP1-RA. Trace bilateral LEE noted, no PND or orthopnea.    Today he returns to HF clinic for pharmacist medication titration. At last pharmacy clinic visit, patient was instructed to continue taking torsemide  40 mg daily, metoprolol  succinate 25 mg daily, and Entresto  97/103 mg BID. At today's visit, the patient reports taking torsemide  one tablet (20 mg) daily and Entresto  97/103 mg once daily as he suspects it is causing worsening GI symptoms in the evening. Noted patient also recently started Mounjaro  and metformin  within the past month. Weight has decreased 3 lbs since last visit. Trace LEE noted, patient reports swelling in legs much less than normal today. Patient reports getting SOB when walking from parking lot into stores, no symptoms at rest. Denies dizziness or lightheadedness. Reports checking blood pressure at home daily before taking medications, reports SBP ~200 mmHg. Reports taking medications 2 hours prior to today's visit, BP down to 158/84 mmHg in clinic. Smoking cessation discussed, patient plans to pick up nicotine  patches from pharmacy today; rates motivation to quit an 8 out of 10. Congratulated patient on the steps he's taken to improve his health.  HF Medications: Metoprolol  succinate 25 mg daily Entresto  97/103 mg BID - patient only taking once daily Torsemide  40 mg (2 tablets) daily - patient only taking one tablet daily   Has the patient been experiencing any side effects to the medications prescribed?  None reported   Does the patient have any problems obtaining medications due to transportation or finances?   No, patient has [place] Medicaid   Understanding of regimen: good Understanding of indications:  good Potential of compliance: fair Patient understands to avoid NSAIDs. Patient understands to avoid decongestants.   Pertinent Lab Values: (08/29/23) Serum creatinine 0.76 mg/dL, BUN 10 mg/dL, Potassium 3.7 mmol/L, Sodium 135 mmol/L, BNP 311 pg/mL  Vital Signs: Weight: 284.6 lbs (last clinic weight: 287 lbs) Blood pressure: 158/84 mmHg (last clinic BP: 190/108 mmHg) Heart rate: 96 bpm   Assessment/Plan: 1. Chronic HFiEF - Echo (01/2021): 35-40% LVH mod pericardial effusion. EF previously 55% in 2019.  - Cath (01/2021): Normal cors. EF 45% (suspect HTN in nature). Mildly elevated pressures with high CO - Echo (10/2021): EF improved 55-60%. RV mildly reduced.   Echo 08/2023 EF 40-45%, RV normal. - Reports NYHA IIb-III symptoms. Volume assessment difficult on exam due to body habitus but only note trace LEE.  -Had been off medications for 6 months when last presented to [facility] 08/29/23. Have been gradually restarting GDMT.  - Has trace LEE, so will continue torsemide  20 mg daily. - Continue metoprolol  succinate 25 mg daily. - Increase Entresto  to 97/103 mg twice daily (had only been taking once daily). Emphasized importance of taking medication twice daily as prescribed.  - Restart eplerenone  25 mg daily. Had breast tenderness/pain with spironolactone .  Plan to recheck BMP at follow-up appointment with [facility] next week. -Avoid SGLT2i for now given uncontrolled BG. Can hopefully restart once back stable on GLP1-RA and BG better. Most recent A1C 12%. - Stressed importance of maintaining strict compliance w/ meds and f/u    2. Hypertension  - Remains elevated, but much improved from prior visits. - Increase Entresto  to 97/103 mg twice daily. -Continue amlodipine  10 mg daily. Will continue while BP remains uncontrolled. May be able to come off as other HF GDMT is uptitrated.  - Asked him to continue checking BP at home daily, encouraged him to start checking after he takes his medications to help  with titration.   3. OSA, severe - has CPAP device but noncompliant   - Discussed compliance.  He says he will restart CPAP  - Previously followed with Dr. Burnard. Will need to transition to Dr. Shlomo    4. DM2  -HgB A1C 12.0% 08/29/23 (up from 6/1% when last checked 2 years ago) - Reestablished care with PCP on 10/10/23. - Continue Mounjaro  and metformin , uptitration managed by PCP - Consider re-initiating Jardiance  once A1C has improved.  - He has already made progress on lifestyle changes like eating better and getting exercise. Congratulated and encouraged to continue.    5. Obesity, morbid - Body mass index is 45.94 kg/m. - Continue Mounjaro    6. Tobacco Abuse - Patient to pick up prescription for nicotine  patches from pharmacy today - Reports craving cigarettes after meals, discussed swapping for an alternative habits after each meal - Congratulated and encouraged patient on his quit attempt   Follow up in 1 week with [facility]   Morna Breach, PharmD PGY2 Cardiology Pharmacy Resident  Tinnie Redman, PharmD, BCPS, [facility], CPP Heart Failure Clinic Pharmacist [phone]

## 2023-10-24 NOTE — Progress Notes (Incomplete)
 ADVANCED [facility] CLINIC NOTE   PCP: Paseda, Folashade R, FNP Primary Cardiologist: Dr Lonni  HF Cardiologist: Dr. Bensimhon  Reason for Visit: f/u for HFimEF   HPI: Randy Morgan is a 58 y.o. with morbid obesity, HTN, OSA, HFpEF, tobacco abuse, and DM2.   Sleep study 10/2019 AHI 80 with desaturations to 68%.   Admitted 01/30/21 with increased dyspnea in the setting of COVID. Echo EF 35-40%.  He was volume overloaded and diuresed with IV lasix .  CT negative for PE. Started on GDMT. Discharge weight 310 pounds.   R/L Cath 02/16/21 Normal cors. LVEF 45% RA = 12 RV = 44/14 PA = 40/24 (21) PCW = 21 Fick cardiac output/index = 8.0/3.3 PVR = 0.9 WU FA sat = 97% PA sat =  78%, 80% SVC sat = 79%  Echo 10/23 EF improved to 55-60%, RV mildly reduced.  He is here today for overdue CHF follow-up. Last seen in clinic in 03/24.    Says he has been off all meds x 6 months. Lost his meds when he moved homes. Despite being off meds, he says he feels great. Denies resting dyspnea but NYHA IIb-III. He is severely obese. There is no height or weight on file to calculate BMI. Not compliant w/ CPAP. He was on Ozempic  and would like to restart. He had quit smoking but picked back up. Smoking < 1/4 ppd.   He denies CP.   BP elevated today 162/102.  EKG shows NSR 96 bpm.   Overall poor insight.   Cardiac Testing  - Echo (10/2021): EF 55-60% RV mildly reduced. Moderate LVH.   - Echo (01/2021): EF 35-40% Moderate pericardial effusion. Severe LVH.   - Echo (2019): EF 55-60% Grade I DD Mild LVH. Trivial pericardial effusion.   Past Medical History:  Diagnosis Date   Asthma    CHF (congestive heart failure) (HCC)    Hypertension    Sleep apnea    hasn't started CPAP yet   Current Outpatient Medications  Medication Sig Dispense Refill   albuterol  (VENTOLIN  HFA) 108 (90 Base) MCG/ACT inhaler Inhale 2 puffs into the lungs every 6 (six) hours as needed for wheezing or shortness of breath. 8 g  2   amLODipine  (NORVASC ) 10 MG tablet Take 1 tablet (10 mg total) by mouth daily. 90 tablet 1   Blood Glucose Monitoring Suppl DEVI 1 each by Does not apply route in the morning, at noon, and at bedtime. May substitute to any manufacturer covered by patient's insurance. 1 each 0   ENTRESTO  97-103 MG Take 1 tablet by mouth 2 (two) times daily. 180 tablet 1   eplerenone  (INSPRA ) 25 MG tablet Take 1 tablet (25 mg total) by mouth daily. 30 tablet 11   fexofenadine  (ALLEGRA  HIVES 24HR) 180 MG tablet Take 1 tablet (180 mg total) by mouth daily.     Glucose Blood (BLOOD GLUCOSE TEST STRIPS) STRP 1 each by In Vitro route in the morning, at noon, and at bedtime. May substitute to any manufacturer covered by patient's insurance. 100 strip 0   Lancet Device MISC 1 each by Does not apply route in the morning, at noon, and at bedtime. May substitute to any manufacturer covered by patient's insurance. 1 each 0   Lancets Misc. MISC 1 each by Does not apply route in the morning, at noon, and at bedtime. May substitute to any manufacturer covered by patient's insurance. 100 each 0   metFORMIN  (GLUCOPHAGE -XR) 500 MG 24 hr tablet Take  1 tablet (500 mg total) by mouth daily with breakfast. 90 tablet 1   metoprolol  succinate (TOPROL  XL) 25 MG 24 hr tablet Take 1 tablet (25 mg total) by mouth daily. 90 tablet 1   nicotine  (NICODERM CQ  - DOSED IN MG/24 HOURS) 21 mg/24hr patch Place 1 patch (21 mg total) onto the skin daily. 42 patch 0   pantoprazole  (PROTONIX ) 40 MG tablet Take 1 tablet (40 mg total) by mouth daily. 30 tablet 3   rosuvastatin  (CRESTOR ) 10 MG tablet Take 1 tablet (10 mg total) by mouth daily. 90 tablet 3   tirzepatide  (MOUNJARO ) 2.5 MG/0.5ML Pen Inject 2.5 mg into the skin once a week. 2 mL 0   torsemide  (DEMADEX ) 20 MG tablet Take 2 tablets (40 mg total) by mouth daily. 180 tablet 1   No current facility-administered medications for this visit.   Allergies  Allergen Reactions   Spironolactone  Other  (See Comments)    Breast pain/tenderness   Social History   Socioeconomic History   Marital status: Married    Spouse name: Not on file   Number of children: 2   Years of education: 12th grade + 2 years college   Highest education level: Some college, no degree  Occupational History   Occupation: makes concrete blocks and bricks   Occupation: Estate agent  Tobacco Use   Smoking status: Every Day    Current packs/day: 0.00    Average packs/day: 0.5 packs/day for 25.0 years (12.5 ttl pk-yrs)    Types: Cigarettes    Start date: 02/26/1996    Last attempt to quit: 02/25/2021    Years since quitting: 2.6   Smokeless tobacco: Never  Vaping Use   Vaping status: Never Used  Substance and Sexual Activity   Alcohol use: Not Currently    Alcohol/week: 2.0 standard drinks of alcohol    Types: 2 Cans of beer per week    Comment: occ beer   Drug use: No   Sexual activity: Not Currently  Other Topics Concern   Not on file  Social History Narrative   Lives with his wife and daughter.   Other child lives independently in [place] .   Drinks about 3 sodas a day       Pt stated he quit smoking   Social Drivers of Corporate investment banker Strain: High Risk (02/01/2021)   Overall Financial Resource Strain (CARDIA)    Difficulty of Paying Living Expenses: Hard  Food Insecurity: No Food Insecurity (02/01/2021)   Hunger Vital Sign    Worried About Running Out of Food in the Last Year: Never true    Ran Out of Food in the Last Year: Never true  Transportation Needs: No Transportation Needs (02/01/2021)   PRAPARE - Administrator, Civil Service (Medical): No    Lack of Transportation (Non-Medical): No  Physical Activity: Not on file  Stress: Not on file  Social Connections: Not on file  Intimate Partner Violence: Not on file   Family History  Problem Relation Age of Onset   Heart disease Mother    Diabetes Mother    Asthma Mother    Heart disease Father    Asthma  Sister    Heart disease Brother    Heart attack Brother    Colon cancer Neg Hx    Esophageal cancer Neg Hx    Rectal cancer Neg Hx    Stomach cancer Neg Hx    There were no vitals taken for this  visit.  Wt Readings from Last 3 Encounters:  10/20/23 129.1 kg (284 lb 9.6 oz)  10/10/23 130.6 kg (288 lb)  10/10/23 130.2 kg (287 lb)   There were no vitals filed for this visit.  GENERAL: super morbidly obese, NAD Lungs- clear CARDIAC:  JVP not elevated          Normal rate with regular rhythm. No murmur.  No edema.  ABDOMEN: Obese, Soft, non-tender, non-distended.  EXTREMITIES: Warm and well perfused.  NEUROLOGIC: No obvious FND    ASSESSMENT & PLAN:  1. Chronic HFiEF - Echo (1/23): 35-40% LVH mod pericardial effusion. EF previously 55% in 2019.  - Cath (1/23): Normal cors. EF 45% (suspect HTN in nature). Mildly elevated pressures with high CO - Echo (10/23): EF improved 55-60%. RV mildly reduced.   - has been off all meds x 6 months. BP elevated. Reports NYHA IIb-III symptoms. Volume assessment difficult on exam due to body habitus but no appreciable LEE or JVD noted.  - check BMP and BNP  - gradually restart GDMT - restart Entresto  49-51 mg bid. Repeat BMP in 7 days  - Update Echo  - stressed importance of maintaining strict compliance w/ meds and f/u   2. Hypertension  - elevated in setting of med and CPAP noncompliance, currently not on any meds - restart Entresto  per above - BMP today   3. OSA, severe - has CPAP device but noncompliant   - Discussed compliance.  He says he will restart CPAP  - Previously followed with Dr. Burnard. Will need to transition to Dr. Shlomo   4. DM2 - hasn't seen PCP recently, instructed to reestablish care - check Hgb A1c - will try to work on getting back on SGLt2i + GLP1   5. Obesity, morbid - There is no height or weight on file to calculate BMI. - previously on Ozempic . No longer taking but would like to restart - w/ OSA, would  like to get him on Mounjaro . Will refer to pharmD for GLP1  6. Tobacco Abuse - advised to quit   Follow-up: w/ HF pharmD in 4 wks for further med titration. HF APP in 8 wks. Refer to pharmacy clinic for GLP1  IF EF remains preserved on f/u echo, can [facility] the [facility] and send to gen cards for ongoing management (Dr. Lonni)   [address] [place], PA-C   Harlene CHRISTELLA Gainer, [place]  12:42 PM

## 2023-10-24 NOTE — Telephone Encounter (Signed)
 PA approved see other encounter for more info

## 2023-10-28 ENCOUNTER — Encounter (HOSPITAL_COMMUNITY)

## 2023-10-31 ENCOUNTER — Telehealth: Payer: Self-pay | Admitting: Pharmacist

## 2023-10-31 ENCOUNTER — Other Ambulatory Visit (HOSPITAL_COMMUNITY): Payer: Self-pay

## 2023-10-31 ENCOUNTER — Telehealth: Payer: Self-pay | Admitting: Pharmacy Technician

## 2023-10-31 MED ORDER — MOUNJARO 5 MG/0.5ML ~~LOC~~ SOAJ: 5.0000 mg | SUBCUTANEOUS | 0 refills | Status: DC

## 2023-10-31 NOTE — Telephone Encounter (Signed)
   Pharmacy Patient Advocate Encounter   Received notification from CoverMyMeds that prior authorization for mounjaro  5mg  is required/requested.   Insurance verification completed.   The patient is insured through [facility] MEDICAID.   Per test claim: PA required; PA submitted to above mentioned insurance via Latent Key/confirmation #/EOC [id number] Status is pending

## 2023-10-31 NOTE — Telephone Encounter (Signed)
 Lost 3 lbs, have been doing exercise most days of the week. Ready to move on to Mounjaro  5 mg once a week. Prescription sent to preferred pharmacy.

## 2023-10-31 NOTE — Telephone Encounter (Signed)
 Pharmacy Patient Advocate Encounter  Received notification from [facility] MEDICAID that Prior Authorization for MOUNJARO  has been APPROVED from 10/31/23 to 10/30/24   PA #/Case ID/Reference #: [id number]

## 2023-11-13 ENCOUNTER — Telehealth (HOSPITAL_COMMUNITY): Payer: Self-pay

## 2023-11-13 NOTE — Telephone Encounter (Signed)
 Called to confirm/remind patient of their appointment at the [facility] on 11/14/23.   Appointment:   [] Confirmed  [x] Left mess   [] No answer/No voice mail  [] VM Full/unable to leave message  [] Phone not in service  And to bring in all medications and/or complete list.

## 2023-11-14 ENCOUNTER — Other Ambulatory Visit: Payer: Self-pay

## 2023-11-14 ENCOUNTER — Encounter (HOSPITAL_COMMUNITY): Payer: Self-pay

## 2023-11-14 ENCOUNTER — Encounter: Payer: Self-pay | Admitting: Nurse Practitioner

## 2023-11-14 ENCOUNTER — Ambulatory Visit (HOSPITAL_COMMUNITY)
Admission: RE | Admit: 2023-11-14 | Discharge: 2023-11-14 | Disposition: A | Discharge status: Home / Self Care | Source: Ambulatory Visit | Attending: Family Medicine | Admitting: Family Medicine

## 2023-11-14 ENCOUNTER — Ambulatory Visit (INDEPENDENT_AMBULATORY_CARE_PROVIDER_SITE_OTHER): Payer: Self-pay | Admitting: Nurse Practitioner

## 2023-11-14 VITALS — BP 146/90 | HR 70 | Ht 66.0 in | Wt 288.8 lb

## 2023-11-14 VITALS — BP 146/83 | HR 70 | Ht 66.0 in | Wt 288.8 lb

## 2023-11-14 DIAGNOSIS — E119 Type 2 diabetes mellitus without complications: Secondary | ICD-10-CM | POA: Diagnosis not present

## 2023-11-14 DIAGNOSIS — I10 Essential (primary) hypertension: Secondary | ICD-10-CM

## 2023-11-14 DIAGNOSIS — E785 Hyperlipidemia, unspecified: Secondary | ICD-10-CM | POA: Diagnosis not present

## 2023-11-14 DIAGNOSIS — G4733 Obstructive sleep apnea (adult) (pediatric): Secondary | ICD-10-CM

## 2023-11-14 DIAGNOSIS — I5022 Chronic systolic (congestive) heart failure: Secondary | ICD-10-CM

## 2023-11-14 DIAGNOSIS — E118 Type 2 diabetes mellitus with unspecified complications: Secondary | ICD-10-CM | POA: Diagnosis not present

## 2023-11-14 DIAGNOSIS — Z720 Tobacco use: Secondary | ICD-10-CM

## 2023-11-14 DIAGNOSIS — F17201 Nicotine dependence, unspecified, in remission: Secondary | ICD-10-CM

## 2023-11-14 MED ORDER — TIRZEPATIDE 7.5 MG/0.5ML ~~LOC~~ SOAJ: 7.5000 mg | SUBCUTANEOUS | 0 refills | Status: DC

## 2023-11-14 NOTE — Assessment & Plan Note (Signed)
 BP Readings from Last 3 Encounters:  11/14/23 (!) 146/90  11/14/23 (!) 146/83  10/20/23 (!) 158/84   Blood pressure not at target levels; office reading 146/83 mmHg, home readings better. Inconsistent medication adherence may contribute to suboptimal control. - Encourage adherence to prescribed antihypertensive regimen. - Promote a heart-healthy, low-salt, low-fat diet. - Encourage moderate exercise, such as walking, 30 minutes five days a week. - Encourage bringing all medications to appointments for verification.

## 2023-11-14 NOTE — Progress Notes (Signed)
 Established Patient Office Visit  Subjective:  Patient ID: Randy Morgan, male    DOB: [birth year]  Age: 58 y.o. MRN: [id number]  CC:  Chief Complaint  Patient presents with   Diabetes   Hypertension   Hyperlipidemia   Medical Management of Chronic Issues    HPI   Discussed the use of AI scribe software for clinical note transcription with the patient, who gave verbal consent to proceed.  History of Present Illness Randy Morgan is a 58 year old male  has a past medical history of Asthma, CHF (congestive heart failure) (HCC), Hypertension, and Sleep apnea.  who presents for a regular follow-up. He is accompanied by his wife, who assists with his medication management.  His blood pressure readings at home have been as low as 136/68 mmHg, although today's reading is 146/83 mmHg. SABRA There is some confusion about his medication regimen but the patient stated that he has been taking all of his medications, and his wife helps manage his medications using a pill box.  He has recently started Mounjaro  for diabetes management and notes appetite suppression, feeling less hungry. He is on a 5 mg dose weekly and experiences mild queasiness at times but no significant nausea or vomiting. He is also taking metformin  500 mg daily. He monitors his blood sugar daily and reports improved levels.  He has a history of heart failure and is on a fluid restriction of 1500 cc per day. No chest pain, shortness of breath, or swelling recently. He can detect when he consumes too much salt, as it causes swelling.  He quit smoking almost a month ago and engages in physical activities such as cutting grass and walking with his wife, limited to once a week. 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  He is also taking albuterol  as needed, although he has not used it recently. He mentions a medication called Inspira, which he is unsure if he is  currently taking. He plans to check his medications at home and communicate any discrepancies.  He is following a heart-healthy diet, avoiding fried foods and red meats, and consuming more vegetables and protein.    Assessment & Plan       Past Medical History:  Diagnosis Date   Asthma    CHF (congestive heart failure) (HCC)    Hypertension    Sleep apnea    hasn't started CPAP yet    Past Surgical History:  Procedure Laterality Date   NO PAST SURGERIES     RIGHT/LEFT HEART CATH AND CORONARY ANGIOGRAPHY N/A 02/16/2021   Procedure: RIGHT/LEFT HEART CATH AND CORONARY ANGIOGRAPHY;  Surgeon: Cherrie Toribio SAUNDERS, MD;  Location: [facility];  Service: Cardiovascular;  Laterality: N/A;   VIDEO BRONCHOSCOPY Bilateral 12/09/2017   Procedure: VIDEO BRONCHOSCOPY WITHOUT FLUORO;  Surgeon: Neda Jennet LABOR, MD;  Location: [facility];  Service: Cardiopulmonary;  Laterality: Bilateral;    Family History  Problem Relation Age of Onset   Heart disease Mother    Diabetes Mother    Asthma Mother    Heart disease Father    Asthma Sister    Heart disease Brother    Heart attack Brother    Colon cancer Neg Hx    Esophageal cancer Neg Hx    Rectal cancer Neg Hx    Stomach cancer Neg Hx     Social History   Socioeconomic History   Marital status: Married  Spouse name: Not on file   Number of children: 2   Years of education: 12th grade + 2 years college   Highest education level: Some college, no degree  Occupational History   Occupation: makes concrete blocks and bricks   Occupation: Estate agent  Tobacco Use   Smoking status: Former    Current packs/day: 0.00    Average packs/day: 0.5 packs/day for 25.0 years (12.5 ttl pk-yrs)    Types: Cigarettes    Start date: 02/26/1996    Quit date: 02/25/2021    Years since quitting: 2.7   Smokeless tobacco: Never  Vaping Use   Vaping status: Never Used  Substance and Sexual Activity   Alcohol use: Not Currently     Alcohol/week: 2.0 standard drinks of alcohol    Types: 2 Cans of beer per week    Comment: occ beer   Drug use: No   Sexual activity: Not Currently  Other Topics Concern   Not on file  Social History Narrative   Lives with his wife and daughter.   Other child lives independently in [place] .   Drinks about 3 sodas a day       Pt stated he quit smoking   Social Drivers of Corporate investment banker Strain: High Risk (02/01/2021)   Overall Financial Resource Strain (CARDIA)    Difficulty of Paying Living Expenses: Hard  Food Insecurity: No Food Insecurity (02/01/2021)   Hunger Vital Sign    Worried About Running Out of Food in the Last Year: Never true    Ran Out of Food in the Last Year: Never true  Transportation Needs: No Transportation Needs (02/01/2021)   PRAPARE - Administrator, Civil Service (Medical): No    Lack of Transportation (Non-Medical): No  Physical Activity: Not on file  Stress: Not on file  Social Connections: Not on file  Intimate Partner Violence: Not on file    Outpatient Medications Prior to Visit  Medication Sig Dispense Refill   albuterol  (VENTOLIN  HFA) 108 (90 Base) MCG/ACT inhaler Inhale 2 puffs into the lungs every 6 (six) hours as needed for wheezing or shortness of breath. 8 g 2   amLODipine  (NORVASC ) 10 MG tablet Take 1 tablet (10 mg total) by mouth daily. 90 tablet 1   Blood Glucose Monitoring Suppl DEVI 1 each by Does not apply route in the morning, at noon, and at bedtime. May substitute to any manufacturer covered by patient's insurance. 1 each 0   ENTRESTO  97-103 MG Take 1 tablet by mouth 2 (two) times daily. 180 tablet 1   fexofenadine  (ALLEGRA  HIVES 24HR) 180 MG tablet Take 1 tablet (180 mg total) by mouth daily.     metoprolol  succinate (TOPROL  XL) 25 MG 24 hr tablet Take 1 tablet (25 mg total) by mouth daily. 90 tablet 1   nicotine  (NICODERM CQ  - DOSED IN MG/24 HOURS) 21 mg/24hr patch Place 1 patch (21 mg total) onto the skin daily.  42 patch 0   rosuvastatin  (CRESTOR ) 10 MG tablet Take 1 tablet (10 mg total) by mouth daily. 90 tablet 3   tirzepatide  (MOUNJARO ) 5 MG/0.5ML Pen Inject 5 mg into the skin once a week. 2 mL 0   torsemide  (DEMADEX ) 20 MG tablet Take 2 tablets (40 mg total) by mouth daily. 180 tablet 1   metFORMIN  (GLUCOPHAGE -XR) 500 MG 24 hr tablet Take 1 tablet (500 mg total) by mouth daily with breakfast. 90 tablet 1   eplerenone  (INSPRA ) 25 MG  tablet Take 1 tablet (25 mg total) by mouth daily. 30 tablet 11   pantoprazole  (PROTONIX ) 40 MG tablet Take 1 tablet (40 mg total) by mouth daily. (Patient not taking: Reported on 11/14/2023) 30 tablet 3   No facility-administered medications prior to visit.    Allergies  Allergen Reactions   Spironolactone  Other (See Comments)    Breast pain/tenderness    ROS Review of Systems  Constitutional:  Negative for appetite change, chills, fatigue and fever.  HENT:  Negative for congestion, postnasal drip, rhinorrhea and sneezing.   Respiratory:  Negative for cough, shortness of breath and wheezing.   Cardiovascular:  Negative for chest pain, palpitations and leg swelling.  Gastrointestinal:  Negative for abdominal pain, constipation, nausea and vomiting.  Genitourinary:  Negative for difficulty urinating, dysuria, flank pain and frequency.  Musculoskeletal:  Negative for arthralgias, back pain, joint swelling and myalgias.  Skin:  Negative for color change, pallor, rash and wound.  Neurological:  Negative for dizziness, facial asymmetry, weakness, numbness and headaches.  Psychiatric/Behavioral:  Negative for behavioral problems, confusion, self-injury and suicidal ideas.       Objective:    Physical Exam Vitals and nursing note reviewed.  Constitutional:      General: He is not in acute distress.    Appearance: Normal appearance. He is obese. He is not ill-appearing, toxic-appearing or diaphoretic.  Eyes:     General: No scleral icterus.       Right eye: No  discharge.        Left eye: No discharge.     Extraocular Movements: Extraocular movements intact.     Conjunctiva/sclera: Conjunctivae normal.  Cardiovascular:     Rate and Rhythm: Normal rate and regular rhythm.     Pulses: Normal pulses.     Heart sounds: Normal heart sounds. No murmur heard.    No friction rub. No gallop.  Pulmonary:     Effort: Pulmonary effort is normal. No respiratory distress.     Breath sounds: Normal breath sounds. No stridor. No wheezing, rhonchi or rales.  Chest:     Chest wall: No tenderness.  Abdominal:     General: There is no distension.     Palpations: Abdomen is soft.     Tenderness: There is no abdominal tenderness. There is no right CVA tenderness, left CVA tenderness or guarding.  Musculoskeletal:        General: No swelling, tenderness, deformity or signs of injury.     Right lower leg: No edema.     Left lower leg: No edema.  Skin:    General: Skin is warm and dry.     Capillary Refill: Capillary refill takes 2 to 3 seconds.     Coloration: Skin is not jaundiced or pale.     Findings: No bruising, erythema or lesion.  Neurological:     Mental Status: He is alert and oriented to person, place, and time.     Motor: No weakness.     Gait: Gait normal.  Psychiatric:        Mood and Affect: Mood normal.        Behavior: Behavior normal.        Thought Content: Thought content normal.        Judgment: Judgment normal.     BP (!) 146/83   Pulse 70   Ht 5' 6 (1.676 m)   Wt 288 lb 12.8 oz (131 kg)   SpO2 98%   BMI 46.61 kg/m  Wt  Readings from Last 3 Encounters:  11/14/23 288 lb 12.8 oz (131 kg)  11/14/23 288 lb 12.8 oz (131 kg)  10/20/23 284 lb 9.6 oz (129.1 kg)    Lab Results  Component Value Date   TSH 0.502 02/01/2021   Lab Results  Component Value Date   WBC 5.7 02/09/2021   HGB 11.9 (L) 02/16/2021   HCT 35.0 (L) 02/16/2021   MCV 89.7 02/09/2021   PLT 186 02/09/2021   Lab Results  Component Value Date   NA 136  09/05/2023   K 3.9 09/05/2023   CO2 29 09/05/2023   GLUCOSE 545 (HH) 09/05/2023   BUN 13 09/05/2023   CREATININE 1.04 09/05/2023   BILITOT 0.9 03/07/2021   ALKPHOS 69 03/07/2021   AST 28 03/07/2021   ALT 23 02/01/2021   PROT 7.3 03/07/2021   ALBUMIN 4.2 03/07/2021   CALCIUM  8.9 09/05/2023   ANIONGAP 10 09/05/2023   EGFR 75 03/07/2021   Lab Results  Component Value Date   CHOL 166 02/03/2021   Lab Results  Component Value Date   HDL 36 (L) 02/03/2021   Lab Results  Component Value Date   LDLCALC 98 02/03/2021   Lab Results  Component Value Date   TRIG 160 (H) 02/03/2021   Lab Results  Component Value Date   CHOLHDL 4.6 02/03/2021   Lab Results  Component Value Date   HGBA1C 12.0 (H) 08/29/2023      Assessment & Plan:   Problem List Items Addressed This Visit       Cardiovascular and Mediastinum   Essential hypertension, benign   BP Readings from Last 3 Encounters:  11/14/23 (!) 146/90  11/14/23 (!) 146/83  10/20/23 (!) 158/84   Blood pressure not at target levels; office reading 146/83 mmHg, home readings better. Inconsistent medication adherence may contribute to suboptimal control. - Encourage adherence to prescribed antihypertensive regimen. - Promote a heart-healthy, low-salt, low-fat diet. - Encourage moderate exercise, such as walking, 30 minutes five days a week. - Encourage bringing all medications to appointments for verification.         Endocrine   Type 2 diabetes mellitus with complication, without long-term current use of insulin (HCC) - Primary   Lab Results  Component Value Date   HGBA1C 12.0 (H) 08/29/2023   Type 2 diabetes mellitus Management includes metformin  and Mounjaro . Blood sugar levels improved but can be optimized. Mounjaro  well-tolerated with minor queasiness. - Increase metformin  to 500 mg twice daily. - Continue Mounjaro , increase to 7.5 mg weekly after completing current 5 mg dose. - Check A1c in four weeks. - Refer  to diabetes nutrition education. Diabetic foot exam completed       Relevant Medications   tirzepatide  (MOUNJARO ) 7.5 MG/0.5ML Pen   metFORMIN  (GLUCOPHAGE -XR) 500 MG 24 hr tablet   Other Relevant Orders   Microalbumin / creatinine urine ratio   Lipid panel   Ambulatory referral to diabetic education     Other   Tobacco use disorder, severe, in early remission    Nicotine  dependence in remission, cessation for almost a month. - Continue to abstain from smoking.         Morbid obesity (HCC)   Wt Readings from Last 3 Encounters:  11/14/23 288 lb 12.8 oz (131 kg)  11/14/23 288 lb 12.8 oz (131 kg)  10/20/23 284 lb 9.6 oz (129.1 kg)   Body mass index is 46.61 kg/m.   Management includes Mounjaro , aiding in weight loss. Reports increased energy levels and  improved physical activity. - Continue Mounjaro  as prescribed. - Encourage a diet rich in vegetables and protein, avoiding fried foods and red meats.       Relevant Medications   tirzepatide  (MOUNJARO ) 7.5 MG/0.5ML Pen   metFORMIN  (GLUCOPHAGE -XR) 500 MG 24 hr tablet   Dyslipidemia, goal LDL below 70   Lab Results  Component Value Date   CHOL 166 02/03/2021   HDL 36 (L) 02/03/2021   LDLCALC 98 02/03/2021   TRIG 160 (H) 02/03/2021   CHOLHDL 4.6 02/03/2021  On rosuvastatin  10 mg daily Checking lipid panel LDL goal is less than 70       Meds ordered this encounter  Medications   tirzepatide  (MOUNJARO ) 7.5 MG/0.5ML Pen    Sig: Inject 7.5 mg into the skin once a week.    Dispense:  2 mL    Refill:  0   metFORMIN  (GLUCOPHAGE -XR) 500 MG 24 hr tablet    Sig: Take 1 tablet (500 mg total) by mouth 2 (two) times daily with a meal.    Dispense:  180 tablet    Refill:  1    Follow-up: Return in about 5 weeks (around 12/19/2023) for HTN, DM.    Keelen Quevedo R Derrall Hicks, FNP

## 2023-11-14 NOTE — Assessment & Plan Note (Addendum)
 Lab Results  Component Value Date   HGBA1C 12.0 (H) 08/29/2023   Type 2 diabetes mellitus Management includes metformin  and Mounjaro . Blood sugar levels improved but can be optimized. Mounjaro  well-tolerated with minor queasiness. - Increase metformin  to 500 mg twice daily. - Continue Mounjaro , increase to 7.5 mg weekly after completing current 5 mg dose. - Check A1c in four weeks. - Refer to diabetes nutrition education. Diabetic foot exam completed

## 2023-11-14 NOTE — Assessment & Plan Note (Addendum)
 Lab Results  Component Value Date   CHOL 166 02/03/2021   HDL 36 (L) 02/03/2021   LDLCALC 98 02/03/2021   TRIG 160 (H) 02/03/2021   CHOLHDL 4.6 02/03/2021  On rosuvastatin  10 mg daily Checking lipid panel LDL goal is less than 70

## 2023-11-14 NOTE — Progress Notes (Signed)
 [facility]  PCP: Paseda, Folashade R, FNP Primary Cardiologist: Dr. Lonni  HF Cardiologist: Dr. Cherrie   HPI: Randy Morgan is a 58 y.o. with morbid obesity, HTN, OSA, HFpEF, tobacco abuse, and DM2.   Sleep study 10/2019 AHI 80 with desaturations to 68%.   Admitted 01/30/21 with increased dyspnea in the setting of COVID. Echo EF 35-40%.  He was volume overloaded and diuresed with IV lasix .  CT negative for PE. Started on GDMT. Discharge weight 310 pounds.   R/L Cath 02/16/21 Normal cors. LVEF 45% RA = 12 RV = 44/14 PA = 40/24 (21) PCW = 21 Fick cardiac output/index = 8.0/3.3 PVR = 0.9 WU FA sat = 97% PA sat =  78%, 80% SVC sat = 79%  Echo 10/23 EF improved to 55-60%, RV mildly reduced.  Lost to follow up 10/23. Seen back 8/25, off all meds. Meds restarted.  Echo 8/25 showed EF 40-45%, moderate LVH, RV normal, small to moderate pericardial effusion (off most GDMT).  Today he returns for HF follow up. Overall feeling fine. No SOB walking up steps or cutting grass. Denies palpitations, abnormal bleeding, CP, dizziness, edema, or PND/Orthopnea. Appetite ok. Weight at home 288 pounds. Taking all medications. Not wearing CPAP, quit smoking 09/2023. Now established with PCP.   Cardiac Testing  - Echo (8/25): EF 40-45%, moderate LVH, RV normal, small to moderate pericardial effusion  - Echo (10/23): EF 55-60% RV mildly reduced. Moderate LVH.   - Echo (1/23): EF 35-40% Moderate pericardial effusion. Severe LVH.   - Echo (2019): EF 55-60% Grade I DD Mild LVH. Trivial pericardial effusion.   Past Medical History:  Diagnosis Date   Asthma    CHF (congestive heart failure) (HCC)    Hypertension    Sleep apnea    hasn't started CPAP yet   Current Outpatient Medications  Medication Sig Dispense Refill   albuterol  (VENTOLIN  HFA) 108 (90 Base) MCG/ACT inhaler Inhale 2 puffs into the lungs every 6 (six) hours as needed for wheezing or shortness of breath. 8  g 2   amLODipine  (NORVASC ) 10 MG tablet Take 1 tablet (10 mg total) by mouth daily. 90 tablet 1   Blood Glucose Monitoring Suppl DEVI 1 each by Does not apply route in the morning, at noon, and at bedtime. May substitute to any manufacturer covered by patient's insurance. 1 each 0   ENTRESTO  97-103 MG Take 1 tablet by mouth 2 (two) times daily. 180 tablet 1   eplerenone  (INSPRA ) 25 MG tablet Take 1 tablet (25 mg total) by mouth daily. 30 tablet 11   fexofenadine  (ALLEGRA  HIVES 24HR) 180 MG tablet Take 1 tablet (180 mg total) by mouth daily.     metFORMIN  (GLUCOPHAGE -XR) 500 MG 24 hr tablet Take 1 tablet (500 mg total) by mouth 2 (two) times daily with a meal. 180 tablet 1   metoprolol  succinate (TOPROL  XL) 25 MG 24 hr tablet Take 1 tablet (25 mg total) by mouth daily. 90 tablet 1   nicotine  (NICODERM CQ  - DOSED IN MG/24 HOURS) 21 mg/24hr patch Place 1 patch (21 mg total) onto the skin daily. 42 patch 0   rosuvastatin  (CRESTOR ) 10 MG tablet Take 1 tablet (10 mg total) by mouth daily. 90 tablet 3   tirzepatide  (MOUNJARO ) 5 MG/0.5ML Pen Inject 5 mg into the skin once a week. 2 mL 0   tirzepatide  (MOUNJARO ) 7.5 MG/0.5ML Pen Inject 7.5 mg into the skin once a week. 2 mL 0  torsemide  (DEMADEX ) 20 MG tablet Take 2 tablets (40 mg total) by mouth daily. 180 tablet 1   pantoprazole  (PROTONIX ) 40 MG tablet Take 1 tablet (40 mg total) by mouth daily. (Patient not taking: Reported on 11/14/2023) 30 tablet 3   No current facility-administered medications for this encounter.   Allergies  Allergen Reactions   Spironolactone  Other (See Comments)    Breast pain/tenderness   Social History   Socioeconomic History   Marital status: Married    Spouse name: Not on file   Number of children: 2   Years of education: 12th grade + 2 years college   Highest education level: Some college, no degree  Occupational History   Occupation: makes concrete blocks and bricks   Occupation: Estate agent  Tobacco Use    Smoking status: Former    Current packs/day: 0.00    Average packs/day: 0.5 packs/day for 25.0 years (12.5 ttl pk-yrs)    Types: Cigarettes    Start date: 02/26/1996    Quit date: 02/25/2021    Years since quitting: 2.7   Smokeless tobacco: Never  Vaping Use   Vaping status: Never Used  Substance and Sexual Activity   Alcohol use: Not Currently    Alcohol/week: 2.0 standard drinks of alcohol    Types: 2 Cans of beer per week    Comment: occ beer   Drug use: No   Sexual activity: Not Currently  Other Topics Concern   Not on file  Social History Narrative   Lives with his wife and daughter.   Other child lives independently in [place] .   Drinks about 3 sodas a day       Pt stated he quit smoking   Social Drivers of Corporate investment banker Strain: High Risk (02/01/2021)   Overall Financial Resource Strain (CARDIA)    Difficulty of Paying Living Expenses: Hard  Food Insecurity: No Food Insecurity (02/01/2021)   Hunger Vital Sign    Worried About Running Out of Food in the Last Year: Never true    Ran Out of Food in the Last Year: Never true  Transportation Needs: No Transportation Needs (02/01/2021)   PRAPARE - Administrator, Civil Service (Medical): No    Lack of Transportation (Non-Medical): No  Physical Activity: Not on file  Stress: Not on file  Social Connections: Not on file  Intimate Partner Violence: Not on file   Family History  Problem Relation Age of Onset   Heart disease Mother    Diabetes Mother    Asthma Mother    Heart disease Father    Asthma Sister    Heart disease Brother    Heart attack Brother    Colon cancer Neg Hx    Esophageal cancer Neg Hx    Rectal cancer Neg Hx    Stomach cancer Neg Hx    Wt Readings from Last 3 Encounters:  11/14/23 131 kg (288 lb 12.8 oz)  11/14/23 131 kg (288 lb 12.8 oz)  10/20/23 129.1 kg (284 lb 9.6 oz)   BP (!) 146/90   Pulse 70   Ht 5' 6 (1.676 m)   Wt 131 kg (288 lb 12.8 oz)   SpO2 98%   BMI  46.61 kg/m   Physical Exam General:  NAD. No resp difficulty, walked into clinic HEENT: Normal Neck: Supple. No JVD. Thick neck Cor: Regular rate & rhythm. No rubs, gallops or murmurs. Lungs: Clear Abdomen: Soft, obese, nontender, nondistended.  Extremities: No cyanosis,  clubbing, rash, edema Neuro: Alert & oriented x 3, moves all 4 extremities w/o difficulty. Affect pleasant.   ASSESSMENT & PLAN: 1. Chronic HFiEF - Echo (1/23): 35-40% LVH mod pericardial effusion. EF previously 55% in 2019.  - Cath (1/23): Normal cors. EF 45% (suspect HTN in nature). Mildly elevated pressures with high CO - Echo (10/23): EF improved 55-60%. RV mildly reduced.   - NYHA I, volume OK today. - Continue torsemide  40 mg daily. - Continue Entresto  97/103 mg bid. - Continue eplerenone  25 mg daily. - Continue Toprol  XL 25 mg daily. - No SGLT2i with A1C 12, re-assess when glucose is better controlled - Just had labs at PCP today, requests drawing labs another day. Will get a BMET in 10-14 days  2. Hypertension  - BP elevated but overall better controlled - Continue amlodipine  10 mg daily. - Check BP daily and log. Notify clinic if SBP>150 - Really needs OSA treated  3. OSA, severe - He is willing to try CPAP again - I asked him to notify clinic if he is unable to tolerate, will then refer to Sleep Medicine.  4. DM2 - A1C 12 - On metformin  and tirzepatide  - Management per PCP  5. Obesity  - Body mass index is 46.61 kg/m. - now back on GLP1  6. Tobacco Abuse - Quit x 1 month - Congratulated.  Follow up in 3 months with APP.  Harlene CHRISTELLA Gainer, FNP  11:20 AM

## 2023-11-14 NOTE — Assessment & Plan Note (Addendum)
  Nicotine  dependence in remission, cessation for almost a month. - Continue to abstain from smoking.

## 2023-11-14 NOTE — Patient Instructions (Addendum)
 Goal for fasting blood sugar ranges from 80 to 120 and 2 hours after any meal or at bedtime should be between 130 to 170.    1. Type 2 diabetes mellitus with complication, without long-term current use of insulin (HCC) (Primary)  - tirzepatide  (MOUNJARO ) 7.5 MG/0.5ML Pen; Inject 7.5 mg into the skin once a week.  Dispense: 2 mL; Refill: 0 - metFORMIN  (GLUCOPHAGE -XR) 500 MG 24 hr tablet; Take 1 tablet (500 mg total) by mouth 2 (two) times daily with a meal.  Dispense: 180 tablet; Refill: 1 - Microalbumin / creatinine urine ratio - Lipid panel - Ambulatory referral to diabetic education  [phone]  It is important that you exercise regularly at least 30 minutes 5 times a week as tolerated  Think about what you will eat, plan ahead. Choose  clean, green, fresh or frozen over canned, processed or packaged foods which are more sugary, salty and fatty. 70 to 75% of food eaten should be vegetables and fruit. Three meals at set times with snacks allowed between meals, but they must be fruit or vegetables. Aim to eat over a 12 hour period , example 7 am to 7 pm, and STOP after  your last meal of the day. Drink water,generally about 64 ounces per day, no other drink is as healthy. Fruit juice is best enjoyed in a healthy way, by EATING the fruit.  Thanks for choosing [facility] we consider it a privelige to serve you.

## 2023-11-14 NOTE — Patient Instructions (Signed)
 Medication Changes:  No Changes In Medications at this time.   Lab Work:  Engineer, technical sales FOR LABS IN 2 WEEKS AS SCHEDULED   Special Instructions // Education:  CHECK BLOOD PRESSURE DAILY-- PLEASE CALL US  AND NOTIFY CLINIC IS SYSTOLIC BLOOD PRESSURE IS GREATER THAN 150 ON THE TOP   Follow-Up in: 2-3 MONTHS WITH APP AS SCHEDULED   At the [facility], you and your health needs are our priority. We have a designated team specialized in the treatment of Heart Failure. This Care Team includes your primary Heart Failure Specialized Cardiologist (physician), Advanced Practice Providers (APPs- Physician Assistants and Nurse Practitioners), and Pharmacist who all work together to provide you with the care you need, when you need it.   You may see any of the following providers on your designated Care Team at your next follow up:  Dr. Toribio Fuel Dr. Ezra Shuck Dr. Ria Commander Dr. Odis Brownie Greig Mosses, NP Caffie Shed, [place] [facility] [place], [place] Beckey Coe, NP [place] Lee, NP Tinnie Redman, PharmD   Please be sure to bring in all your medications bottles to every appointment.   Need to Contact Us :  If you have any questions or concerns before your next appointment please send us  a message through [place] or call our office at [phone].    TO LEAVE A MESSAGE FOR THE NURSE SELECT OPTION 2, PLEASE LEAVE A MESSAGE INCLUDING: YOUR NAME DATE OF BIRTH CALL BACK NUMBER REASON FOR CALL**this is important as we prioritize the call backs  YOU WILL RECEIVE A CALL BACK THE SAME DAY AS LONG AS YOU CALL BEFORE 4:00 PM

## 2023-11-14 NOTE — Assessment & Plan Note (Signed)
 Wt Readings from Last 3 Encounters:  11/14/23 288 lb 12.8 oz (131 kg)  11/14/23 288 lb 12.8 oz (131 kg)  10/20/23 284 lb 9.6 oz (129.1 kg)   Body mass index is 46.61 kg/m.   Management includes Mounjaro , aiding in weight loss. Reports increased energy levels and improved physical activity. - Continue Mounjaro  as prescribed. - Encourage a diet rich in vegetables and protein, avoiding fried foods and red meats.

## 2023-11-15 LAB — LIPID PANEL
Chol/HDL Ratio: 6.3 ratio — ABNORMAL HIGH (ref 0.0–5.0)
Cholesterol, Total: 246 mg/dL — ABNORMAL HIGH (ref 100–199)
HDL: 39 mg/dL — ABNORMAL LOW (ref >39)
LDL Chol Calc (NIH): 178 mg/dL — ABNORMAL HIGH (ref 0–99)
Triglycerides: 154 mg/dL — ABNORMAL HIGH (ref 0–149)
VLDL Cholesterol Cal: 29 mg/dL (ref 5–40)

## 2023-11-15 LAB — MICROALBUMIN / CREATININE URINE RATIO
Creatinine, Urine: 19.3 mg/dL
Microalb/Creat Ratio: <16 mg/g{creat} (ref 0–29)
Microalbumin, Urine: <3 ug/mL

## 2023-11-17 ENCOUNTER — Ambulatory Visit: Payer: Self-pay | Admitting: Nurse Practitioner

## 2023-11-17 DIAGNOSIS — E785 Hyperlipidemia, unspecified: Secondary | ICD-10-CM

## 2023-11-17 MED ORDER — ROSUVASTATIN CALCIUM 20 MG PO TABS
20.0000 mg | ORAL_TABLET | Freq: Every day | ORAL | 3 refills | Status: AC
Start: 2023-11-17 — End: ?

## 2023-11-17 MED ORDER — ROSUVASTATIN CALCIUM 40 MG PO TABS
40.0000 mg | ORAL_TABLET | Freq: Every day | ORAL | 1 refills | Status: DC
Start: 2023-11-17 — End: 2023-11-17

## 2023-11-19 ENCOUNTER — Encounter (INDEPENDENT_AMBULATORY_CARE_PROVIDER_SITE_OTHER): Payer: Self-pay | Admitting: Physician Assistant

## 2023-11-19 ENCOUNTER — Ambulatory Visit (INDEPENDENT_AMBULATORY_CARE_PROVIDER_SITE_OTHER): Admitting: Physician Assistant

## 2023-11-19 VITALS — BP 156/93 | Ht 66.0 in | Wt 285.0 lb

## 2023-11-19 DIAGNOSIS — R430 Anosmia: Secondary | ICD-10-CM

## 2023-11-19 DIAGNOSIS — Z8616 Personal history of COVID-19: Secondary | ICD-10-CM | POA: Diagnosis not present

## 2023-11-19 DIAGNOSIS — H9191 Unspecified hearing loss, right ear: Secondary | ICD-10-CM | POA: Diagnosis not present

## 2023-11-19 NOTE — Progress Notes (Unsigned)
 Dear Dr. Juanice, Here is my assessment for our mutual patient, Randy Morgan. Thank you for allowing me the opportunity to care for your patient. Please do not hesitate to contact me should you have any other questions. Sincerely, Chyrl Cohen PA-C  Otolaryngology Clinic Note Referring provider: Dr. Juanice HPI:  Randy Morgan is a 58 y.o. male kindly referred by Dr. Juanice   The patient is a 58 year old gentleman presenting today for evaluation of decreased hearing.  The patient notes that approximately 1 to 2 years ago he woke up and had loss of hearing out of his right ear, he notes this is near complete.  He notes no left-sided hearing issues.  He denied any associated pain, no preceding infections, no neurologic symptoms, no ringing.  He notes some occasional dizziness and feeling off balance, but no vertiginous symptoms.  He denies any preceding trauma to his ears no history recurrent ear infections, no head trauma.  He does note that around March 2023 he did have COVID and lost his sense of smell, he notes this was not at the same time as his hearing loss.  He notes a history of grass allergy, no other seasonal allergies.  He notes occasionally he can smell, no changes to taste.  No nasal obstruction, no facial pain.   Independent Review of Additional Tests or Records:  None   PMH/Meds/All/SocHx/FamHx/ROS:   Past Medical History:  Diagnosis Date   Asthma    CHF (congestive heart failure) (HCC)    Hypertension    Sleep apnea    hasn't started CPAP yet     Past Surgical History:  Procedure Laterality Date   NO PAST SURGERIES     RIGHT/LEFT HEART CATH AND CORONARY ANGIOGRAPHY N/A 02/16/2021   Procedure: RIGHT/LEFT HEART CATH AND CORONARY ANGIOGRAPHY;  Surgeon: Cherrie Toribio SAUNDERS, MD;  Location: [facility];  Service: Cardiovascular;  Laterality: N/A;   VIDEO BRONCHOSCOPY Bilateral 12/09/2017   Procedure: VIDEO BRONCHOSCOPY WITHOUT FLUORO;  Surgeon: Neda Jennet LABOR,  MD;  Location: [facility];  Service: Cardiopulmonary;  Laterality: Bilateral;    Family History  Problem Relation Age of Onset   Heart disease Mother    Diabetes Mother    Asthma Mother    Heart disease Father    Asthma Sister    Heart disease Brother    Heart attack Brother    Colon cancer Neg Hx    Esophageal cancer Neg Hx    Rectal cancer Neg Hx    Stomach cancer Neg Hx      Social Connections: Not on file      Current Outpatient Medications:    albuterol  (VENTOLIN  HFA) 108 (90 Base) MCG/ACT inhaler, Inhale 2 puffs into the lungs every 6 (six) hours as needed for wheezing or shortness of breath., Disp: 8 g, Rfl: 2   amLODipine  (NORVASC ) 10 MG tablet, Take 1 tablet (10 mg total) by mouth daily., Disp: 90 tablet, Rfl: 1   Blood Glucose Monitoring Suppl DEVI, 1 each by Does not apply route in the morning, at noon, and at bedtime. May substitute to any manufacturer covered by patient's insurance., Disp: 1 each, Rfl: 0   ENTRESTO  97-103 MG, Take 1 tablet by mouth 2 (two) times daily., Disp: 180 tablet, Rfl: 1   eplerenone  (INSPRA ) 25 MG tablet, Take 1 tablet (25 mg total) by mouth daily., Disp: 30 tablet, Rfl: 11   fexofenadine  (ALLEGRA  HIVES 24HR) 180 MG tablet, Take 1 tablet (180 mg total) by mouth daily.,  Disp: , Rfl:    metFORMIN  (GLUCOPHAGE -XR) 500 MG 24 hr tablet, Take 1 tablet (500 mg total) by mouth 2 (two) times daily with a meal., Disp: 180 tablet, Rfl: 1   metoprolol  succinate (TOPROL  XL) 25 MG 24 hr tablet, Take 1 tablet (25 mg total) by mouth daily., Disp: 90 tablet, Rfl: 1   nicotine  (NICODERM CQ  - DOSED IN MG/24 HOURS) 21 mg/24hr patch, Place 1 patch (21 mg total) onto the skin daily., Disp: 42 patch, Rfl: 0   pantoprazole  (PROTONIX ) 40 MG tablet, Take 1 tablet (40 mg total) by mouth daily. (Patient not taking: Reported on 11/14/2023), Disp: 30 tablet, Rfl: 3   rosuvastatin  (CRESTOR ) 20 MG tablet, Take 1 tablet (20 mg total) by mouth daily., Disp: 90 tablet, Rfl: 3    tirzepatide  (MOUNJARO ) 5 MG/0.5ML Pen, Inject 5 mg into the skin once a week., Disp: 2 mL, Rfl: 0   tirzepatide  (MOUNJARO ) 7.5 MG/0.5ML Pen, Inject 7.5 mg into the skin once a week., Disp: 2 mL, Rfl: 0   torsemide  (DEMADEX ) 20 MG tablet, Take 2 tablets (40 mg total) by mouth daily., Disp: 180 tablet, Rfl: 1   Physical Exam:   BP (!) 155/101   Ht 5' 6 (1.676 m)   Wt 285 lb (129.3 kg)   BMI 46.00 kg/m   Pertinent Findings  CN II-XII intact Bilateral EAC clear and TM intact with well pneumatized middle ear spaces Weber 512: Glasses left Rinne 512: AC > BC left, no response right Anterior rhinoscopy: Septum midline; bilateral inferior turbinates with minimal No lesions of oral cavity/oropharynx; dentition within normal limits No obviously palpable neck masses/lymphadenopathy/thyromegaly No respiratory distress or stridor    Seprately Identifiable Procedures:  None  Impression & Plans:  Randy Morgan is a 58 y.o. male with the following   Decreased hearing-   Several year history of right sided acute hearing loss, no associated symptoms although he does describe some balance related issues.  He is uncertain if this is due to ongoing medical management for high blood pressure and diabetes.  No acute neurologic deficits on exam today.  I would like audiological evaluation for further assessment, he will likely need additional imaging once his results are available.  Anosmia-  Status post COVID but will continue to assess this given his decreased hearing to assure no pathological cause.  He does have allergy to grass, no other seasonal allergies and no significant nasal congestion on exam today.  Will discuss this further at his next office visit.   - f/u follow-up phone call discussion after audiological results   Thank you for allowing me the opportunity to care for your patient. Please do not hesitate to contact me should you have any other questions.  Sincerely, Chyrl Cohen PA-C [facility] Phone: [phone] Fax: [phone]  11/19/2023, 2:22 PM

## 2023-11-19 NOTE — Progress Notes (Unsigned)
 Patient has not taken BP meds today. Patient explains that his BP is in a good range for him as it is usually higher. Second read was lower. Patient is having BP managed.

## 2023-11-28 ENCOUNTER — Ambulatory Visit (HOSPITAL_COMMUNITY)

## 2023-12-01 ENCOUNTER — Ambulatory Visit (HOSPITAL_COMMUNITY): Payer: Self-pay | Admitting: Family Medicine

## 2023-12-01 ENCOUNTER — Ambulatory Visit (HOSPITAL_COMMUNITY)
Admission: RE | Admit: 2023-12-01 | Discharge: 2023-12-01 | Disposition: A | Discharge status: Home / Self Care | Source: Ambulatory Visit | Attending: Cardiology | Admitting: Cardiology

## 2023-12-01 DIAGNOSIS — I5022 Chronic systolic (congestive) heart failure: Secondary | ICD-10-CM | POA: Insufficient documentation

## 2023-12-01 LAB — BASIC METABOLIC PANEL WITH GFR
Anion gap: 11 (ref 5–15)
BUN: 13 mg/dL (ref 6–20)
CO2: 28 mmol/L (ref 22–32)
Calcium: 8.7 mg/dL — ABNORMAL LOW (ref 8.9–10.3)
Chloride: 101 mmol/L (ref 98–111)
Creatinine, Ser: 1.12 mg/dL (ref 0.61–1.24)
GFR, Estimated: >60 mL/min (ref >60)
Glucose, Bld: 162 mg/dL — ABNORMAL HIGH (ref 70–99)
Potassium: 3.9 mmol/L (ref 3.5–5.1)
Sodium: 140 mmol/L (ref 135–145)

## 2023-12-04 ENCOUNTER — Telehealth (HOSPITAL_COMMUNITY): Payer: Self-pay

## 2023-12-04 NOTE — Telephone Encounter (Signed)
 Called to confirm/remind patient of their appointment at the [facility] on 12/05/23.   Appointment:   [] Confirmed  [x] Left mess   [] No answer/No voice mail  [] VM Full/unable to leave message  [] Phone not in service  Patient reminded to bring all medications and/or complete list.

## 2023-12-05 ENCOUNTER — Ambulatory Visit (HOSPITAL_COMMUNITY)
Admission: RE | Admit: 2023-12-05 | Discharge: 2023-12-05 | Disposition: A | Discharge status: Home / Self Care | Source: Ambulatory Visit | Attending: Cardiology | Admitting: Cardiology

## 2023-12-05 ENCOUNTER — Encounter (HOSPITAL_COMMUNITY): Payer: Self-pay

## 2023-12-05 VITALS — BP 188/120 | HR 96 | Wt 302.2 lb

## 2023-12-05 DIAGNOSIS — Z91148 Patient's other noncompliance with medication regimen for other reason: Secondary | ICD-10-CM | POA: Diagnosis not present

## 2023-12-05 DIAGNOSIS — I5022 Chronic systolic (congestive) heart failure: Secondary | ICD-10-CM | POA: Diagnosis present

## 2023-12-05 DIAGNOSIS — Z87891 Personal history of nicotine dependence: Secondary | ICD-10-CM | POA: Diagnosis not present

## 2023-12-05 DIAGNOSIS — Z7985 Long-term (current) use of injectable non-insulin antidiabetic drugs: Secondary | ICD-10-CM | POA: Insufficient documentation

## 2023-12-05 DIAGNOSIS — E1169 Type 2 diabetes mellitus with other specified complication: Secondary | ICD-10-CM | POA: Insufficient documentation

## 2023-12-05 DIAGNOSIS — I3139 Other pericardial effusion (noninflammatory): Secondary | ICD-10-CM | POA: Diagnosis not present

## 2023-12-05 DIAGNOSIS — Z7984 Long term (current) use of oral hypoglycemic drugs: Secondary | ICD-10-CM | POA: Diagnosis not present

## 2023-12-05 DIAGNOSIS — Z6841 Body Mass Index (BMI) 40.0 and over, adult: Secondary | ICD-10-CM | POA: Diagnosis not present

## 2023-12-05 DIAGNOSIS — G4733 Obstructive sleep apnea (adult) (pediatric): Secondary | ICD-10-CM | POA: Diagnosis not present

## 2023-12-05 DIAGNOSIS — Z79899 Other long term (current) drug therapy: Secondary | ICD-10-CM | POA: Diagnosis not present

## 2023-12-05 DIAGNOSIS — I110 Hypertensive heart disease with heart failure: Secondary | ICD-10-CM | POA: Insufficient documentation

## 2023-12-05 NOTE — Progress Notes (Signed)
 ReDS Vest / Clip - 12/05/23 1000       ReDS Vest / Clip   Station Marker D    Ruler Value 39    ReDS Value Range High volume overload    ReDS Actual Value 41

## 2023-12-05 NOTE — Progress Notes (Signed)
 [facility]  PCP: Paseda, Folashade R, FNP Primary Cardiologist: Dr. Lonni  HF Cardiologist: Dr. Bensimhon   Reason for Visit: f/u for systolic heart failure   HPI: [name] is a 58 y.o. with morbid obesity, HTN, OSA, HFpEF, tobacco abuse, and DM2.   Sleep study 10/2019 AHI 80 with desaturations to 68%.   Admitted 01/30/21 with increased dyspnea in the setting of COVID. Echo EF 35-40%.  He was volume overloaded and diuresed with IV lasix .  CT negative for PE. Started on GDMT. Discharge weight 310 pounds.   Morgan/L Cath 02/16/21 Normal cors. LVEF 45% RA = 12 RV = 44/14 PA = 40/24 (21) PCW = 21 Fick cardiac output/index = 8.0/3.3 PVR = 0.9 WU FA sat = 97% PA sat =  78%, 80% SVC sat = 79%  Echo 10/23 EF improved to 55-60%, RV mildly reduced.  Lost to follow up 10/23. Seen back 8/25, off all meds. Meds restarted.  Echo 8/25 showed EF 40-45%, moderate LVH, RV normal, small to moderate pericardial effusion (off most GDMT).  He presents today for f/u. Here w/ wife. Ran out of meds 1 wk ago. Refills available at pharmacy but he hasn't picked up yet. BP elevated at 118/120. He says that when he takes his meds, his BP has been in the 130s systolic. Has been tolerating well w/o side effects. He has BMP done earlier this wk, Scr was 1.2, K 3.9.   He denies resting dyspnea. No LEE. Reports stable NYHA Class IIb symptoms, confounded by obesity and deconditioning. He was recently stated on Mounjaro  but hasn't lost a significant amount of wt yet.   ReDs was measured and elevated at 44% but doubt accurate reading, suspect falsely elevated w/ large chest AP diameter.    Cardiac Testing  - Echo (8/25): EF 40-45%, moderate LVH, RV normal, small to moderate pericardial effusion  - Echo (10/23): EF 55-60% RV mildly reduced. Moderate LVH.   - Echo (1/23): EF 35-40% Moderate pericardial effusion. Severe LVH.   - Echo (2019): EF 55-60% Grade I DD Mild LVH. Trivial  pericardial effusion.   Past Medical History:  Diagnosis Date   Asthma    CHF (congestive heart failure) (HCC)    Hypertension    Sleep apnea    hasn't started CPAP yet   Current Outpatient Medications  Medication Sig Dispense Refill   albuterol  (VENTOLIN  HFA) 108 (90 Base) MCG/ACT inhaler Inhale 2 puffs into the lungs every 6 (six) hours as needed for wheezing or shortness of breath. 8 g 2   Blood Glucose Monitoring Suppl DEVI 1 each by Does not apply route in the morning, at noon, and at bedtime. May substitute to any manufacturer covered by patient's insurance. 1 each 0   metFORMIN  (GLUCOPHAGE -XR) 500 MG 24 hr tablet Take 1 tablet (500 mg total) by mouth 2 (two) times daily with a meal. 180 tablet 1   metoprolol  succinate (TOPROL  XL) 25 MG 24 hr tablet Take 1 tablet (25 mg total) by mouth daily. 90 tablet 1   nicotine  (NICODERM CQ  - DOSED IN MG/24 HOURS) 21 mg/24hr patch Place 1 patch (21 mg total) onto the skin daily. 42 patch 0   pantoprazole  (PROTONIX ) 40 MG tablet Take 1 tablet (40 mg total) by mouth daily. 30 tablet 3   amLODipine  (NORVASC ) 10 MG tablet Take 1 tablet (10 mg total) by mouth daily. (Patient not taking: Reported on 12/05/2023) 90 tablet 1   ENTRESTO  97-103 MG Take 1  tablet by mouth 2 (two) times daily. (Patient not taking: Reported on 12/05/2023) 180 tablet 1   eplerenone  (INSPRA ) 25 MG tablet Take 1 tablet (25 mg total) by mouth daily. (Patient not taking: Reported on 12/05/2023) 30 tablet 11   fexofenadine  (ALLEGRA  HIVES 24HR) 180 MG tablet Take 1 tablet (180 mg total) by mouth daily. (Patient not taking: Reported on 12/05/2023)     rosuvastatin  (CRESTOR ) 20 MG tablet Take 1 tablet (20 mg total) by mouth daily. (Patient not taking: Reported on 12/05/2023) 90 tablet 3   tirzepatide  (MOUNJARO ) 5 MG/0.5ML Pen Inject 5 mg into the skin once a week. 2 mL 0   tirzepatide  (MOUNJARO ) 7.5 MG/0.5ML Pen Inject 7.5 mg into the skin once a week. (Patient not taking: Reported on  12/05/2023) 2 mL 0   torsemide  (DEMADEX ) 20 MG tablet Take 2 tablets (40 mg total) by mouth daily. (Patient not taking: Reported on 12/05/2023) 180 tablet 1   No current facility-administered medications for this encounter.   Allergies  Allergen Reactions   Spironolactone  Other (See Comments)    Breast pain/tenderness   Social History   Socioeconomic History   Marital status: Married    Spouse name: Not on file   Number of children: 2   Years of education: 12th grade + 2 years college   Highest education level: Some college, no degree  Occupational History   Occupation: makes concrete blocks and bricks   Occupation: estate agent  Tobacco Use   Smoking status: Former    Current packs/day: 0.00    Average packs/day: 0.5 packs/day for 25.0 years (12.5 ttl pk-yrs)    Types: Cigarettes    Start date: 02/26/1996    Quit date: 02/25/2021    Years since quitting: 2.7   Smokeless tobacco: Never  Vaping Use   Vaping status: Never Used  Substance and Sexual Activity   Alcohol use: Not Currently    Alcohol/week: 2.0 standard drinks of alcohol    Types: 2 Cans of beer per week    Comment: occ beer   Drug use: No   Sexual activity: Not Currently  Other Topics Concern   Not on file  Social History Narrative   Lives with his wife and daughter.   Other child lives independently in [place] .   Drinks about 3 sodas a day       Pt stated he quit smoking   Social Drivers of Corporate Investment Banker Strain: High Risk (02/01/2021)   Overall Financial Resource Strain (CARDIA)    Difficulty of Paying Living Expenses: Hard  Food Insecurity: No Food Insecurity (02/01/2021)   Hunger Vital Sign    Worried About Running Out of Food in the Last Year: Never true    Ran Out of Food in the Last Year: Never true  Transportation Needs: No Transportation Needs (02/01/2021)   PRAPARE - Administrator, Civil Service (Medical): No    Lack of Transportation (Non-Medical): No  Physical  Activity: Not on file  Stress: Not on file  Social Connections: Not on file  Intimate Partner Violence: Not on file   Family History  Problem Relation Age of Onset   Heart disease Mother    Diabetes Mother    Asthma Mother    Heart disease Father    Asthma Sister    Heart disease Brother    Heart attack Brother    Colon cancer Neg Hx    Esophageal cancer Neg Hx    Rectal  cancer Neg Hx    Stomach cancer Neg Hx    Wt Readings from Last 3 Encounters:  12/05/23 (!) 137.1 kg (302 lb 3.2 oz)  11/19/23 129.3 kg (285 lb)  11/14/23 131 kg (288 lb 12.8 oz)   BP (!) 188/120   Pulse 96   Wt (!) 137.1 kg (302 lb 3.2 oz)   SpO2 98%   BMI 48.78 kg/m   Physical Exam  GENERAL: Super morbid obesity, NAD Lungs- clear CARDIAC:  thick neck, JVD not well visualized          Normal rate with regular rhythm. No MRG. No LEE ABDOMEN: obese, soft, non-tender, non-distended.  EXTREMITIES: Warm and well perfused.  NEUROLOGIC: No obvious FND    ASSESSMENT & PLAN: 1. Chronic HFiEF - Echo (1/23): 35-40% LVH mod pericardial effusion. EF previously 55% in 2019.  - Cath (1/23): Normal cors. EF 45% (suspect HTN in nature). Mildly elevated pressures with high CO - Echo (10/23): EF improved 55-60%. RV mildly reduced.   - NYHA Class IIb confound by obesity/deconditioning. Does not appear grossly volume overloaded on exam  - BP elevated in setting of being out of HF GDMT/diuretics x 1 wk  - restart regimen  - Continue torsemide  40 mg daily. - Continue Entresto  97/103 mg bid. - Continue eplerenone  25 mg daily. - Continue Toprol  XL 25 mg daily. - No SGLT2i with A1C 12, re-assess when glucose is better controlled - reviewed labs from 3 days ago, SCr/K WNL  - f/u w/ PharmD in 4 wks to reassess BP and further adjust meds if needed   2. Hypertension  - elevated in setting of poor med compliance. - he will pick up meds at pharmacy today, regimen per above  - Really needs OSA treated  3. OSA,  severe - He is willing to try CPAP again  4. DM2 - most recent A1C 12. Was off meds. Now restarted  - On metformin  and tirzepatide  - Management per PCP  5. Obesity  - Body mass index is 48.78 kg/m. - now back on GLP1  6. Tobacco Abuse - Quit x 3 months - Congratulated.  PharmD f/u in 4 wks, APP in 3 months.   Zaron Zwiefelhofer, PA-C  11:05 AM

## 2023-12-05 NOTE — Patient Instructions (Addendum)
 Good to see you today!   Please follow up with our heart failure pharmacist as scheduled  Your physician recommends that you schedule a follow-up appointment as scheduled  If you have any questions or concerns before your next appointment please send us  a message through [place] or call our office at [phone].    TO LEAVE A MESSAGE FOR THE NURSE SELECT OPTION 2, PLEASE LEAVE A MESSAGE INCLUDING: YOUR NAME DATE OF BIRTH CALL BACK NUMBER REASON FOR CALL**this is important as we prioritize the call backs  YOU WILL RECEIVE A CALL BACK THE SAME DAY AS LONG AS YOU CALL BEFORE 4:00 PM At the [facility], you and your health needs are our priority. As part of our continuing mission to provide you with exceptional heart care, we have created designated Provider Care Teams. These Care Teams include your primary Cardiologist (physician) and Advanced Practice Providers (APPs- Physician Assistants and Nurse Practitioners) who all work together to provide you with the care you need, when you need it.   You may see any of the following providers on your designated Care Team at your next follow up: Dr Toribio Fuel Dr Ezra Shuck Dr. Morene Brownie Greig Mosses, NP Caffie Shed, [place] [facility] [place], [place] Beckey Coe, NP Jordan Lee, NP Ellouise Class, NP Tinnie Redman, PharmD Jaun Bash, PharmD   Please be sure to bring in all your medications bottles to every appointment.    Thank you for choosing [facility]

## 2023-12-18 ENCOUNTER — Ambulatory Visit (INDEPENDENT_AMBULATORY_CARE_PROVIDER_SITE_OTHER): Admitting: Audiology

## 2023-12-18 DIAGNOSIS — H903 Sensorineural hearing loss, bilateral: Secondary | ICD-10-CM

## 2023-12-18 NOTE — Progress Notes (Signed)
  [address] [place], [place] [address] [phone]  Audiological Evaluation    Name: Randy Morgan     DOB:   30-Aug-1965      MRN:   [id number]                                                                                     Service Date: 12/18/2023     Accompanied by: unaccompanied   Patient comes today after Randy Cohen, PA-C sent a referral for a hearing evaluation due to concerns with right hearing loss.   Symptoms Yes Details  Hearing loss  [x]  Worse in the right ear ( sounds like a distorted speaker) since 6-12 months. May have been sick with a cold during that time.  Tinnitus  []    Ear pain/ infections/pressure  []    Balance problems  []    Noise exposure history  [x]  Occupational in the past  Previous ear surgeries  []    Family history of hearing loss  []    Amplification  []    Other  []      Otoscopy: Right ear: Clear external ear canal and notable landmarks visualized on the tympanic membrane. Left ear:  Clear external ear canal and notable landmarks visualized on the tympanic membrane.  Tympanometry: Right ear: Normal external ear canal volume with normal middle ear pressure and tympanic membrane compliance (Type A). Findings are suggestive of normal middle ear function. Left ear: Normal external ear canal volume with normal middle ear pressure and tympanic membrane compliance (Type A). Findings are suggestive of normal middle ear function.  Hearing Evaluation The hearing test results were completed under headphones and re-checked with inserts and results are deemed to be of fair reliability due to false positives. 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 Left ear-  Normal hearing from 125-500 Hz, then moderate to moderately severe sensorineural hearing loss from 750 Hz - 8000 Hz.  Speech Audiometry: Right ear- Speech Awareness Threshold (SAT) was  observed at 75 dBHL, with contralateral masking. Left ear-Speech Reception Threshold (SRT) was obtained at 35 dBHL.   Word Recognition Score Tested using NU-6 (recorded) Right ear:  Could not test due to degree of hearing loss. Left ear: 92% was obtained at a presentation level of 80 dBHL with contralateral masking which is deemed as  excellent.   Impression: There is a significant difference in pure-tone thresholds between ears, worse in the right ear.   Recommendations: Follow up with ENT as per MD. Return for a hearing evaluation in one years, before if concerns with hearing changes arise or per MD recommendation. Use hearing protection when exposed to loud/damaging sounds.  Consider a communication needs assessment after medical clearance for amplification is obtained.   Randy Morgan Randy Morgan, AUD

## 2023-12-22 ENCOUNTER — Ambulatory Visit: Payer: Self-pay | Admitting: Nurse Practitioner

## 2023-12-22 ENCOUNTER — Telehealth (INDEPENDENT_AMBULATORY_CARE_PROVIDER_SITE_OTHER): Payer: Self-pay | Admitting: Physician Assistant

## 2023-12-22 DIAGNOSIS — H918X3 Other specified hearing loss, bilateral: Secondary | ICD-10-CM

## 2023-12-22 NOTE — Telephone Encounter (Signed)
 Audiological results reviewed, significant sensorineural asymmetry with profound loss of hearing in the right ear.  Patient will need MRI IAC.  Order has been placed.  I called and discussed the results with the patient, I did leave a voicemail.

## 2023-12-29 ENCOUNTER — Other Ambulatory Visit (HOSPITAL_COMMUNITY): Payer: Self-pay | Admitting: Cardiology

## 2023-12-29 DIAGNOSIS — E118 Type 2 diabetes mellitus with unspecified complications: Secondary | ICD-10-CM

## 2023-12-30 ENCOUNTER — Encounter: Payer: Self-pay | Admitting: Pharmacist

## 2023-12-30 MED ORDER — MOUNJARO 10 MG/0.5ML ~~LOC~~ SOAJ: 10.0000 mg | SUBCUTANEOUS | 0 refills | Status: AC

## 2023-12-31 ENCOUNTER — Telehealth (INDEPENDENT_AMBULATORY_CARE_PROVIDER_SITE_OTHER): Payer: Self-pay

## 2023-12-31 ENCOUNTER — Telehealth (INDEPENDENT_AMBULATORY_CARE_PROVIDER_SITE_OTHER): Payer: Self-pay | Admitting: Physician Assistant

## 2023-12-31 NOTE — Telephone Encounter (Signed)
 Pt LVM that he is returning a call to [place].

## 2023-12-31 NOTE — Telephone Encounter (Signed)
 Second call today to discuss hearing results and need for MRI. LVM

## 2024-01-01 ENCOUNTER — Encounter (INDEPENDENT_AMBULATORY_CARE_PROVIDER_SITE_OTHER): Payer: Self-pay

## 2024-01-01 ENCOUNTER — Telehealth (INDEPENDENT_AMBULATORY_CARE_PROVIDER_SITE_OTHER): Payer: Self-pay | Admitting: Physician Assistant

## 2024-01-01 NOTE — Telephone Encounter (Signed)
 I was able to speak to Randy Morgan about his audiological results he has rather significant asymmetric hearing loss.  Given the nature of the sudden hearing loss and audiological results I have recommended MR IAC with contrast.  He will complete this once results are available I will call him and discuss plan moving forward.

## 2024-01-01 NOTE — Telephone Encounter (Signed)
 I tried to call, LVM

## 2024-01-06 ENCOUNTER — Encounter: Payer: Self-pay | Admitting: Audiology

## 2024-01-08 ENCOUNTER — Ambulatory Visit (HOSPITAL_COMMUNITY)

## 2024-01-12 ENCOUNTER — Ambulatory Visit (HOSPITAL_COMMUNITY)
Admission: RE | Admit: 2024-01-12 | Discharge: 2024-01-12 | Disposition: A | Source: Ambulatory Visit | Attending: Cardiology

## 2024-01-12 VITALS — BP 170/106 | Wt 301.8 lb

## 2024-01-12 DIAGNOSIS — I5032 Chronic diastolic (congestive) heart failure: Secondary | ICD-10-CM | POA: Diagnosis present

## 2024-01-12 DIAGNOSIS — G4733 Obstructive sleep apnea (adult) (pediatric): Secondary | ICD-10-CM | POA: Diagnosis not present

## 2024-01-12 DIAGNOSIS — Z6841 Body Mass Index (BMI) 40.0 and over, adult: Secondary | ICD-10-CM | POA: Diagnosis not present

## 2024-01-12 DIAGNOSIS — I5031 Acute diastolic (congestive) heart failure: Secondary | ICD-10-CM

## 2024-01-12 DIAGNOSIS — Z87891 Personal history of nicotine dependence: Secondary | ICD-10-CM | POA: Diagnosis not present

## 2024-01-12 DIAGNOSIS — Z8616 Personal history of COVID-19: Secondary | ICD-10-CM | POA: Diagnosis not present

## 2024-01-12 DIAGNOSIS — Z794 Long term (current) use of insulin: Secondary | ICD-10-CM | POA: Diagnosis not present

## 2024-01-12 DIAGNOSIS — E119 Type 2 diabetes mellitus without complications: Secondary | ICD-10-CM | POA: Diagnosis not present

## 2024-01-12 DIAGNOSIS — E118 Type 2 diabetes mellitus with unspecified complications: Secondary | ICD-10-CM

## 2024-01-12 DIAGNOSIS — I110 Hypertensive heart disease with heart failure: Secondary | ICD-10-CM | POA: Diagnosis not present

## 2024-01-12 DIAGNOSIS — Z79899 Other long term (current) drug therapy: Secondary | ICD-10-CM | POA: Diagnosis not present

## 2024-01-12 DIAGNOSIS — Z91148 Patient's other noncompliance with medication regimen for other reason: Secondary | ICD-10-CM | POA: Diagnosis not present

## 2024-01-12 MED ORDER — EPLERENONE 50 MG PO TABS: 50.0000 mg | ORAL_TABLET | Freq: Every day | ORAL | 3 refills | Status: AC

## 2024-01-12 MED ORDER — METOPROLOL SUCCINATE ER 50 MG PO TB24: 50.0000 mg | ORAL_TABLET | Freq: Every day | ORAL | 3 refills | Status: DC

## 2024-01-12 NOTE — Patient Instructions (Addendum)
 It was a pleasure seeing you today!  MEDICATIONS: -We are changing your medications today -Increase Eplerenone  to 50 mg daily. Can take two 25 mg tablets until you run out, then take one 50 mg tablet daily.  -Increase metoprolol  to 50 mg daily. Can take two 25 mg tablets until you run out, then take one 50 mg tablet day.  -Start taking Entresto  97/103 mg (1 tablet) twice daily. -Call if you have questions about your medications.  LABS: -We will call you if your labs need attention.  NEXT APPOINTMENT: Return to clinic in 1 week to check labs.  Return to pharmacy clinic on Monday, February 2nd at 1:00 pm.   In general, to take care of your heart failure: -Limit your fluid intake to 2 Liters (half-gallon) per day.   -Limit your salt intake to ideally 2-3 grams (2000-3000 mg) per day. -Weigh yourself daily and record, and bring that weight diary to your next appointment.  (Weight gain of 2-3 pounds in 1 day typically means fluid weight.) -The medications for your heart are to help your heart and help you live longer.   -Please contact us  before stopping any of your heart medications.  Call the clinic at [phone] with questions or to reschedule future appointments.

## 2024-01-12 NOTE — Progress Notes (Signed)
 Advanced Heart Failure Clinic Note   PCP: Paseda, Folashade R, FNP Primary Cardiologist: Dr. Lonni  HF Cardiologist: Dr. Cherrie    Reason for Visit: f/u for systolic heart failure   HPI:  Randy Morgan is a 58 y.o. with morbid obesity, HTN, OSA, HFpEF, tobacco abuse, and DM2. Admitted 01/30/21 with increased dyspnea in the setting of COVID. Echo EF 35-40%.  He was volume overloaded and diuresed with IV lasix .  CT negative for PE. Started on GDMT. Discharge weight 310 pounds.   Echo 08/2023 showed EF 40-45%, moderate LVH, RV normal, small to moderate pericardial effusion (off most GDMT).  He returned to AHF clinic for follow-up with PA-C on 12/05/23. He ran out of medications 1 week prior to the visit. He had refills available that he had not yet picked up. BP was elevated to 188/120 mmHg. He said that when he took his medications, his BP had been in the 130s systolic. He had been tolerating medications well without side effects. He was recently started on Mounjaro  but hadn't lost a significant amount of weight yet. ReDs was measured and elevated at 44% but PA-C doubted accurate reading, suspected falsely elevated w/ large chest AP diameter.  Today he returns to HF clinic for pharmacist medication titration with his wife. At last visit with PA-C, patient was encouraged to restart prior medication regimen. Overall doing well, main complaint is GI symptoms since recent dose increase of the Mounjaro . Patient has restarted his regimen and took all of his medications today. He reports adherence to everything except he is only taking his Entresto  once a day. He also reports taking amlodipine  although there is no recent fill history. Patient states he found a ton of amlodipine  in storage that he has been taking. Encouraged patient to pick up a new refill from the pharmacy. Patient denies dizziness of lightheadedness. Checks blood pressure daily in the morning, states BP has been stable at ~170/80s  mmHg, consistent with BP in clinic today. Denies chest pain or palpitations. Reports some DOE that has been stable from prior visits. Does not check weight at home but reports clothes are fitting looser since starting Entresto . Weight stable in clinic today although patient is wearing a heavy coat. No PND or orthopnea. Reports that he quit smoking 4 months ago, congratulated patient.   HF Medications: Metoprolol  succinate 25 mg daily Entresto  97/103 mg BID - patient reports only taking once daily Eplerenone  25 mg daily Torsemide  40 mg daily  Has the patient been experiencing any side effects to the medications prescribed?  Patient increased to Mounjaro  10 mg on Friday, 01/09/24 and has been experiencing significant GI side effects since dose increase. Encouraged patient to reach out to [facility] if symptoms persist throughout this week.   Does the patient have any problems obtaining medications due to transportation or finances?   None reported, patient has an [employer].  Understanding of regimen: good Understanding of indications: good Potential of compliance: fair Patient understands to avoid NSAIDs. Patient understands to avoid decongestants.    Pertinent Lab Values: (12/01/23) Serum creatinine 1.12 mg/dL, BUN 13 mg/dL, Potassium 3.9 mmol/L, Sodium 140 mmol/L, BNP 311.3 pg/mL (08/2023)  Vital Signs: Weight: 301 lbs (last clinic weight: 302 lbs) Blood pressure: 170/106 mmHg Heart rate: 99 bpm  Assessment/Plan: 1. Chronic HFiEF - Echo (08/2023): EF 40-45%, moderate LVH, RV normal, small to moderate pericardial effusion  - NYHA Class IIb confound by obesity/deconditioning. Does not appear grossly volume overloaded  on exam. - Continue torsemide  40 mg daily. - Increase metoprolol  succinate to 50 mg daily. - Continue Entresto  97/103 mg bid. Encouraged patient to start taking medication twice a day.  - Increase eplerenone  to 50 mg daily. BMET in 1 week. - No  SGLT2i with A1C 12, repeat A1C with labs on 01/19/24.  - Consider starting BiDil at a future appointment if BP remains elevated, concerned about adherence with a three times a day medication.  - Repeat Bmet on 01/19/24 with eplerenone  does increase - f/u in pharmacy clinic in 1 month to reassess BP and further adjust meds if needed    2. Hypertension  - elevated in setting of poor med compliance. - Will increase metoprolol  and encouraged patient to take Entresto  twice day. - Reports currently taking amlodipine  despite no recent fill history.  Consider transitioning to BiDil at a future appointment.    3. OSA, severe - He is willing to try CPAP again   4. DM2 - most recent A1C 12. Was off meds. Now restarted.  - Continue tirzepatide  - Management per PCP, encouraged patient to reach out to [facility] if GI symptoms persist beyond this week.    5. Obesity  - Body mass index is 48.71 kg/m. - now back on GLP1   6. Tobacco Abuse - Quit x 4 months - Congratulated.  Follow up in pharmacy clinic in 1 month  Morna Breach, PharmD, BCPS PGY2 Cardiology Pharmacy Resident    I agree with the above as outlined by Dr. Breach.  Please do not hesitate to reach out with questions or concerns,  Jaun Bash, PharmD, CPP, BCPS, [facility] Heart Failure Pharmacist  Phone - [phone] 01/12/2024 4:33 PM

## 2024-01-15 ENCOUNTER — Ambulatory Visit (HOSPITAL_COMMUNITY)

## 2024-01-19 ENCOUNTER — Ambulatory Visit (HOSPITAL_COMMUNITY)
Admission: RE | Admit: 2024-01-19 | Discharge: 2024-01-19 | Disposition: A | Discharge status: Home / Self Care | Source: Ambulatory Visit | Attending: Internal Medicine | Admitting: Internal Medicine

## 2024-01-19 ENCOUNTER — Ambulatory Visit (HOSPITAL_COMMUNITY)

## 2024-01-19 DIAGNOSIS — H918X3 Other specified hearing loss, bilateral: Secondary | ICD-10-CM | POA: Diagnosis present

## 2024-01-19 DIAGNOSIS — I5022 Chronic systolic (congestive) heart failure: Secondary | ICD-10-CM | POA: Insufficient documentation

## 2024-01-19 LAB — BASIC METABOLIC PANEL WITH GFR
Anion gap: 9 (ref 5–15)
BUN: 13 mg/dL (ref 6–20)
CO2: 30 mmol/L (ref 22–32)
Calcium: 8.8 mg/dL — ABNORMAL LOW (ref 8.9–10.3)
Chloride: 101 mmol/L (ref 98–111)
Creatinine, Ser: 1.06 mg/dL (ref 0.61–1.24)
GFR, Estimated: >60 mL/min
Glucose, Bld: 186 mg/dL — ABNORMAL HIGH (ref 70–99)
Potassium: 3.9 mmol/L (ref 3.5–5.1)
Sodium: 140 mmol/L (ref 135–145)

## 2024-01-19 LAB — HEMOGLOBIN A1C
Hgb A1c MFr Bld: 6.4 % — ABNORMAL HIGH (ref 4.8–5.6)
Mean Plasma Glucose: 136.98 mg/dL

## 2024-01-19 MED ORDER — GADOBUTROL 1 MMOL/ML IV SOLN
10.0000 mL | Freq: Once | INTRAVENOUS | Status: AC | PRN
  Administered 2024-01-19: 10 mL via INTRAVENOUS

## 2024-01-20 ENCOUNTER — Ambulatory Visit: Payer: Self-pay | Admitting: Pharmacist

## 2024-02-02 ENCOUNTER — Telehealth (INDEPENDENT_AMBULATORY_CARE_PROVIDER_SITE_OTHER): Payer: Self-pay | Admitting: Physician Assistant

## 2024-02-02 NOTE — Telephone Encounter (Signed)
 Patient called returning PA Chyrl Cohen phone call to go over his MRI. I explained to the patient that PA Chyrl Cohen was in with a patient at this moment but I would let him know to give him a call back. Patient understood.

## 2024-02-02 NOTE — Telephone Encounter (Signed)
 Called to discuss MRI results, LVM.

## 2024-02-05 ENCOUNTER — Other Ambulatory Visit (INDEPENDENT_AMBULATORY_CARE_PROVIDER_SITE_OTHER): Payer: Self-pay | Admitting: Physician Assistant

## 2024-02-05 DIAGNOSIS — D329 Benign neoplasm of meninges, unspecified: Secondary | ICD-10-CM

## 2024-02-12 ENCOUNTER — Ambulatory Visit: Admitting: Neurosurgery

## 2024-02-12 ENCOUNTER — Encounter: Payer: Self-pay | Admitting: Neurosurgery

## 2024-02-12 VITALS — BP 165/104 | HR 98 | Temp 98.7°F | Ht 66.5 in | Wt 308.0 lb

## 2024-02-12 DIAGNOSIS — D492 Neoplasm of unspecified behavior of bone, soft tissue, and skin: Secondary | ICD-10-CM

## 2024-02-12 DIAGNOSIS — D320 Benign neoplasm of cerebral meninges: Secondary | ICD-10-CM | POA: Diagnosis not present

## 2024-02-12 DIAGNOSIS — D329 Benign neoplasm of meninges, unspecified: Secondary | ICD-10-CM

## 2024-02-12 DIAGNOSIS — R229 Localized swelling, mass and lump, unspecified: Secondary | ICD-10-CM

## 2024-02-12 NOTE — Progress Notes (Signed)
 " Assessment : Patient is a 59 year old male with a history of hypertension, heart failure, obstructive sleep apnea, a large left arm mass, and type 2 diabetes who is presents for a neurological evaluation following an incidental finding on imaging.    He was initially referred to ENT for sudden right sided hearing loss occurring after COVID-19 infection.  An MRI of the auditory canal performed on 01/19/2024 showed no abnormalities in the auditory canals or cerebellar pontine angle cisterns, but identified a 3 cm extra-axial mass along the sphenoid with mass effect on the frontal lobes, suspected to be a meningioma.  The patient denies visual issues, seizures, brain fog, weakness, changes in personality, or numbness.  He reports having a headache when his blood pressure is elevated, with no issues of headaches throughout the rest of the day. He also reports that his smell has never been the same since getting COVID and that prior to COVID he had no issues with smell.  Plan : I listened carefully to his story and he specifically states that he got COVID towards the end of 2023 and that is when he lost his sense of smell and taste.  This has never returned since then.  Somewhere in the middle of 2025, he lost hearing on the right side and eventually ended up in the ENT office where imaging was obtained.  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  I brought his to his chart and he has seen Dr. Cherrie, cardiologist, for his heart failure and I understand from it that his ejection fraction is 35 to 40%.  He noted this mass on his arm as well and but I talked to the patient about it, he says that in the past he has seen somebody at [facility] where a needle biopsy was done but he does not remember when and he does not know what the results were.  Unfortunately he does say that this mass is growing and his dominant hand, being the left side has lost significant function!.  Today, he  has 5 out of 5 grip and wrist flexion and 5 out of 5 wrist extension however finger extension is maybe a 2 out of 5 better in the first 3 digits and near absent in the last 2.  I talked to him about priorities for now.  He is most concerned about his dominant hand and I can completely understand why.  To that end, I will refer him to my partner Dr. Penne Sharps, neurosurgeon, who is a fellowship trained peripheral nerve surgeon as I want him to evaluate whether this is a neurofibroma/schwannoma associated with the radial nerve. Additionally, his tumor has to be addressed.  With the significant edema in the bilateral frontal lobes, I am afraid that observation would not be my first choice.  I think if that is causing irritation, we need to rule out any atypia of this tumor for which a craniotomy would be indicated.  We decided that he is going to go and see Dr. Sharps who I have already notified and he is going to come with his daughter and his wife and we will talk about his cranial pathology.   Social History   Socioeconomic History   Marital status: Married    Spouse name: Not on file   Number of children: 2   Years of education: 12th grade + 2 years college   Highest education level: Some college, no degree  Occupational History   Occupation: makes  concrete blocks and bricks   Occupation: estate agent  Tobacco Use   Smoking status: Former    Current packs/day: 0.00    Average packs/day: 0.5 packs/day for 25.0 years (12.5 ttl pk-yrs)    Types: Cigarettes    Start date: 02/26/1996    Quit date: 02/25/2021    Years since quitting: 2.9   Smokeless tobacco: Never  Vaping Use   Vaping status: Never Used  Substance and Sexual Activity   Alcohol use: Not Currently    Alcohol/week: 2.0 standard drinks of alcohol    Types: 2 Cans of beer per week    Comment: occ beer   Drug use: No   Sexual activity: Not Currently  Other Topics Concern   Not on file  Social History Narrative    Lives with his wife and daughter.   Other child lives independently in [place] .   Drinks about 3 sodas a day       Pt stated he quit smoking   Social Drivers of Health   Tobacco Use: Medium Risk (02/12/2024)   Patient History    Smoking Tobacco Use: Former    Smokeless Tobacco Use: Never    Passive Exposure: Not on file  Financial Resource Strain: High Risk (02/01/2021)   Overall Financial Resource Strain (CARDIA)    Difficulty of Paying Living Expenses: Hard  Food Insecurity: No Food Insecurity (02/01/2021)   Hunger Vital Sign    Worried About Running Out of Food in the Last Year: Never true    Ran Out of Food in the Last Year: Never true  Transportation Needs: No Transportation Needs (02/01/2021)   PRAPARE - Administrator, Civil Service (Medical): No    Lack of Transportation (Non-Medical): No  Physical Activity: Not on file  Stress: Not on file  Social Connections: Not on file  Intimate Partner Violence: Not on file  Depression (PHQ2-9): Low Risk (11/14/2023)   Depression (PHQ2-9)    PHQ-2 Score: 0  Alcohol Screen: Low Risk (02/01/2021)   Alcohol Screen    Last Alcohol Screening Score (AUDIT): 1  Housing: Low Risk (02/01/2021)   Housing    Last Housing Risk Score: 0  Utilities: Not on file  Health Literacy: Not on file    Family History  Problem Relation Age of Onset   Heart disease Mother    Diabetes Mother    Asthma Mother    Heart disease Father    Asthma Sister    Heart disease Brother    Heart attack Brother    Colon cancer Neg Hx    Esophageal cancer Neg Hx    Rectal cancer Neg Hx    Stomach cancer Neg Hx     Allergies[1]  Past Medical History:  Diagnosis Date   Asthma    CHF (congestive heart failure) (HCC)    Hypertension    Sleep apnea    hasn't started CPAP yet    Past Surgical History:  Procedure Laterality Date   NO PAST SURGERIES     RIGHT/LEFT HEART CATH AND CORONARY ANGIOGRAPHY N/A 02/16/2021   Procedure: RIGHT/LEFT HEART CATH  AND CORONARY ANGIOGRAPHY;  Surgeon: Cherrie Toribio SAUNDERS, MD;  Location: [facility];  Service: Cardiovascular;  Laterality: N/A;   VIDEO BRONCHOSCOPY Bilateral 12/09/2017   Procedure: VIDEO BRONCHOSCOPY WITHOUT FLUORO;  Surgeon: Neda Jennet LABOR, MD;  Location: [facility];  Service: Cardiopulmonary;  Laterality: Bilateral;     Physical Exam   Physical Exam HENT:  Head: Normocephalic.     Nose: Nose normal.  Eyes:     Pupils: Pupils are equal, round, and reactive to light.  Cardiovascular:     Rate and Rhythm: Normal rate.  Pulmonary:     Effort: Pulmonary effort is normal.  Abdominal:     General: Abdomen is flat.  Musculoskeletal:     Cervical back: Normal range of motion. Large mass proximal LEFT UE Neurological:     Mental Status: Patient is alert.     Cranial Nerves: Cranial nerves 2-12 are intact.     Sensory: Sensation is intact except Dig 4/5 dorsum numb    Motor: Motor function is intact, except 3/5 extensor L 1-3 fingers and 2/5 Dig 4/5     Coordination: Coordination is intact.     No results found for this or any previous visit.     [1]  Allergies Allergen Reactions   Spironolactone  Other (See Comments)    Breast pain/tenderness   "

## 2024-02-23 NOTE — Progress Notes (Incomplete)
 ***In Progress***    Advanced Heart Failure Clinic Note   HPI: [name] is a 59 y.o. with morbid obesity, HTN, OSA, HFpEF, tobacco abuse, and DM2. Admitted 01/30/21 with increased dyspnea in the setting of COVID. Echo EF 35-40%.  He was volume overloaded and diuresed with IV lasix .  CT negative for PE. Started on GDMT. Discharge weight 310 pounds.    Subsequent cMRI 02/2021 with LVEF 29% with diffuse hypokinesis, RVEF 34%. Echo 08/2023 showed EF 40-45%, moderate LVH, RV normal, small to moderate pericardial effusion (off most GDMT), improved.   12/05/23: Clinic follow-up with PA-C. He ran out of medications 1 week prior to the visit. He had refills available that he had not yet picked up. BP was elevated to 188/120 mmHg. He said that when he took his medications, his BP had been in the 130s systolic. He had been tolerating medications well without side effects. He was recently started on Mounjaro  but hadn't lost a significant amount of weight yet. ReDs was measured and elevated at 44% but PA-C doubted accurate reading, suspected falsely elevated w/ large chest AP diameter.   01/12/24: Clinic follow-up with PharmD. Overall was doing well, main complaint was GI symptoms since recent dose increase of Mounjaro . Patient had restarted his regimen and took all of his medications before his appointment. He reported adherence to everything except he was only taking his Entresto  once a day. He also reported taking amlodipine  although there was no recent fill history. Patient stated he found a ton of amlodipine  in storage that he had been taking. Patient denied dizziness or lightheadedness. Checked blood pressure daily in the morning, stated BP has been stable at ~170/80s mmHg, consistent with BP in clinic. Denied chest pain or palpitations. Reported some DOE that was stable from prior visits. Did not check weight at home but reported clothes are fitting looser since starting Entresto . Weight stable in clinic  although patient was wearing a heavy coat. No PND or orthopnea. Reported that he quit smoking 4 months ago.  Today he returns to HF clinic for pharmacist medication titration. At last visit with  PharmD increased eplerenone  to  my daily, increased metoprolol  to  mg daily, and counseled to start taking Entresto   97/103 mg twice daily as prescribed.   Shortness of breath/dyspnea on exertion? {YES NO:22349}  Orthopnea/PND? {YES NO:22349} Edema? {YES NO:22349} Lightheadedness/dizziness? {YES NO:22349} Daily weights at home? {YES NO:22349} Blood pressure mg /heart rate monitoring at home? {YES [id number] Following low-sodium/fluid-restricted diet? {YES NO:22349}  HF Medications: Metoprolol  succinate 50 mg daily Entresto  97/103 mg twice daily Eplerenone  50 mg daily Torsemide  40 mg daily  Previous HF GDMT Intolerances: Spironolactone  (breast pain/tenderness)  Has the patient been experiencing any side effects to the medications prescribed?  {YES NO:22349}  Does the patient have any problems obtaining medications due to transportation or finances?   {YES NO:22349}  Understanding of regimen: {excellent/good/fair/poor:19665} Understanding of indications: {excellent/good/fair/poor:19665} Potential of compliance: {excellent/good/fair/poor:19665} Patient understands to avoid NSAIDs. Patient understands to avoid decongestants.    Pertinent Lab Values (01/19/24): Serum creatinine 1.06, BUN 13, Potassium 3.9, Sodium 140, A1c 6.4  Vital Signs: Weight: *** (last clinic weight: 301.7 lbs) Blood pressure: ***  Heart rate: ***   Assessment/Plan: 1. Chronic HFiEF - Echo (08/2023): EF 40-45%, moderate LVH, RV normal, small to moderate pericardial effusion  - NYHA Class IIb confounded by obesity/deconditioning. Does not appear grossly volume overloaded on exam. *** - Continue torsemide  40 mg daily. *** - Increase metoprolol  succinate to 50  mg daily. - Continue Entresto  97/103 mg twice daily -  Continue eplerenone  50 mg daily - A1c now 6.4 from 12.0, start jardiance  10 mg daily *** - Consider starting BiDil at a future appointment if BP remains elevated, concerned about adherence with a three times a day medication.  - f/u in pharmacy clinic in 1 month to reassess BP and further adjust meds if needed ***    2. Hypertension  - Elevated in the setting of poor med compliance. - Will increase metoprolol  and encouraged patient to take Entresto  twice day. *** - Reports currently taking amlodipine  10 mg daily despite no recent fill history.  Consider transitioning to BiDil at a future appointment.    3. OSA, severe - He is willing to try CPAP again   4. DM2 - most recent A1C 6.4 (01/19/24), improved from 12.0 - Continue tirzepatide  and metformin  - Management per PCP, encouraged patient to reach out to [facility] if GI symptoms persist beyond this week.    5. Obesity  - Body mass index is 48.71 kg/m. - Continue GLP1   6. Tobacco Abuse - Quit x 4 months   - Basic disease state pathophysiology, medication indication, mechanism and side effects reviewed at length with patient and he verbalized understanding  Follow up in *** 2 weeks   Nidia Schaffer, PharmD, BCPS PGY2 Cardiology Pharmacy Resident

## 2024-02-24 ENCOUNTER — Telehealth: Admitting: Neurosurgery

## 2024-02-24 DIAGNOSIS — D492 Neoplasm of unspecified behavior of bone, soft tissue, and skin: Secondary | ICD-10-CM

## 2024-02-24 DIAGNOSIS — R2232 Localized swelling, mass and lump, left upper limb: Secondary | ICD-10-CM

## 2024-02-24 DIAGNOSIS — D329 Benign neoplasm of meninges, unspecified: Secondary | ICD-10-CM

## 2024-02-24 DIAGNOSIS — D320 Benign neoplasm of cerebral meninges: Secondary | ICD-10-CM

## 2024-02-24 NOTE — Progress Notes (Signed)
 Virtual Visit via Video Note  I connected with Randy Morgan on 02/24/24 at  9:20 AM EST by a video enabled telemedicine application and verified that I am speaking with the correct person using two identifiers.  Location: Patient: Home Provider: Clinic   I discussed the limitations of evaluation and management by telemedicine and the availability of in person appointments. The patient expressed understanding and agreed to proceed.  75 gentleman with a past medical history significant for a mass in his dominant left arm who has been progressively losing function of his hand.  Patient had care at [facility] many years ago but then was lost to follow-up.  After COVID a while back he lost complete sense of smell and taste which did not get any further follow-up as it is a known complication after this entity.  He went to the ENT doctor because he had a sudden loss of hearing in his right ear and imaging was done which demonstrated a olfactory groove meningioma with surrounding edema in both hemispheres for which she was referred to me.  During a visit last time with him we went over both problems of his arm and his brain and he was going to go and see my partner Dr. Penne Sharps, neurosurgeon, in [place] to talk about his arm because Dr. Sharps is a peripheral nerve expert.  Today I had a video visit with him and his wife and I went over the images again and explained to them what was going on.  In the end we decided that he is going to hold off on getting steroids and seizure medication and he is going to see Dr. Sharps on 5 February and I will see him back on the 6.  After this, we will make a coordinated plan with Dr. Sharps and what to do first and how we going to take care of the patient.  He and his wife are in agreement with this plan and look forward to seeing them back on the sixth.    I discussed the assessment and treatment plan with the patient. The patient was provided an opportunity to  ask questions and all were answered. The patient agreed with the plan and demonstrated an understanding of the instructions.   The patient was advised to call back or seek an in-person evaluation if the symptoms worsen or if the condition fails to improve as anticipated.  I provided 15 minutes of non-face-to-face time during this encounter.   Maytal Mijangos, MD

## 2024-02-24 NOTE — Progress Notes (Signed)
 "   Referring Physician:  Paseda, Folashade R, FNP [phone] [place],  [place] [address]  Primary Physician:  Paseda, Folashade R, FNP  Discussed the use of AI scribe software for clinical note transcription with the patient, who gave verbal consent to proceed.  History of Present Illness Johnathin Vanderschaaf [name] is a 59 year old left-handed male with a left forearm mass who presents with progressive left upper extremity weakness.  For 2 years he has had progressive weakness and loss of function in the left hand and forearm. The hand rests in a flexed posture. He cannot fully open or spread his fingers, perform thumb-index opposition, finger abduction, or reliably release objects. These deficits impair writing, tying shoes, and other fine motor tasks, and he often needs his right hand to remove objects from the left. He notes a tremulous quality with attempted finger abduction. There is minimal pain with occasional throbbing. Numbness and paresthesias in the hand and forearm are intermittent and worsen when the arm is dependent. He also has weakness and pain at the dorsal left elbow, worsened by lifting. About 1 month ago he developed acute discomfort in the left arm while cranking a blower, which he thought was a muscle tear, with persistent discomfort on some movements. He denies recent trauma.  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 Nerve conduction studies at [facility] were reportedly normal but were stopped early due to discomfort. Further evaluation was delayed by insurance changes. His last MRI was several years ago. He is now seeking reevaluation with improved insurance coverage.  He was referred to neurosurgery after a recent consultation identified a cranial mass on imaging. He is anxious about possible surgery. He has intermittent headaches, dizziness, and impaired coordination. His wife has observed episodes of  unsteadiness, especially on stairs.  Social History   Socioeconomic History   Marital status: Married    Spouse name: Not on file   Number of children: 2   Years of education: 12th grade + 2 years college   Highest education level: Some college, no degree  Occupational History   Occupation: makes concrete blocks and bricks   Occupation: estate agent  Tobacco Use   Smoking status: Former    Current packs/day: 0.00    Average packs/day: 0.5 packs/day for 25.0 years (12.5 ttl pk-yrs)    Types: Cigarettes    Start date: 02/26/1996    Quit date: 02/25/2021    Years since quitting: 2.9   Smokeless tobacco: Never  Vaping Use   Vaping status: Never Used  Substance and Sexual Activity   Alcohol use: Not Currently    Alcohol/week: 2.0 standard drinks of alcohol    Types: 2 Cans of beer per week    Comment: occ beer   Drug use: No   Sexual activity: Not Currently  Other Topics Concern   Not on file  Social History Narrative   Lives with his wife and daughter.   Other child lives independently in [place] .   Drinks about 3 sodas a day       Pt stated he quit smoking   Social Drivers of Health   Tobacco Use: Medium Risk (02/12/2024)   Patient History    Smoking Tobacco Use: Former    Smokeless Tobacco Use: Never    Passive Exposure: Not on file  Financial Resource Strain: High Risk (02/01/2021)   Overall Financial Resource Strain (CARDIA)    Difficulty of  Paying Living Expenses: Hard  Food Insecurity: No Food Insecurity (02/01/2021)   Hunger Vital Sign    Worried About Running Out of Food in the Last Year: Never true    Ran Out of Food in the Last Year: Never true  Transportation Needs: No Transportation Needs (02/01/2021)   PRAPARE - Administrator, Civil Service (Medical): No    Lack of Transportation (Non-Medical): No  Physical Activity: Not on file  Stress: Not on file  Social Connections: Not on file  Intimate Partner Violence: Not on file  Depression  (PHQ2-9): Low Risk (11/14/2023)   Depression (PHQ2-9)    PHQ-2 Score: 0  Alcohol Screen: Low Risk (02/01/2021)   Alcohol Screen    Last Alcohol Screening Score (AUDIT): 1  Housing: Low Risk (02/01/2021)   Housing    Last Housing Risk Score: 0  Utilities: Not on file  Health Literacy: Not on file    Family History  Problem Relation Age of Onset   Heart disease Mother    Diabetes Mother    Asthma Mother    Heart disease Father    Asthma Sister    Heart disease Brother    Heart attack Brother    Colon cancer Neg Hx    Esophageal cancer Neg Hx    Rectal cancer Neg Hx    Stomach cancer Neg Hx     Allergies[1]  Past Medical History:  Diagnosis Date   Asthma    CHF (congestive heart failure) (HCC)    Hypertension    Sleep apnea    hasn't started CPAP yet    Past Surgical History:  Procedure Laterality Date   NO PAST SURGERIES     RIGHT/LEFT HEART CATH AND CORONARY ANGIOGRAPHY N/A 02/16/2021   Procedure: RIGHT/LEFT HEART CATH AND CORONARY ANGIOGRAPHY;  Surgeon: Cherrie Toribio SAUNDERS, MD;  Location: [facility];  Service: Cardiovascular;  Laterality: N/A;   VIDEO BRONCHOSCOPY Bilateral 12/09/2017   Procedure: VIDEO BRONCHOSCOPY WITHOUT FLUORO;  Surgeon: Neda Jennet LABOR, MD;  Location: [facility];  Service: Cardiopulmonary;  Laterality: Bilateral;    Physical Exam GENERAL: NAD, pleasant, cooperative MENTAL STATUS: Alert and oriented x3, Language is fluent with good comprehension CRANIAL NERVES: Pupils are equal, round, and reactive to light, Visual fields intact, Extra-ocular movements intact, Face symmetric with normal sensation, Hearing intact, Palate symmetric, No dysarthria, 5/5 strength in sternocleidomastoid and trapezius, Midline tongue protrusion MOTOR: Muscle bulk and tone are normal proximally with large mass in extensor compartment in LUE, Profound weakness in ulnar and radial nerves on the left.  In the ulnar nerve has continued FCU function, however distal  this has severe weakness noted on physical examination proximally 1-2 out of 5 in these musculature.  Limited intrinsic finger spread.  Limited thumb adduction. Minimal ulnar lumbrical function with preserved median lumbrical function.  Radial deficits seem to be limited to the PIN distribution, he has weakness in ECU but preservation of ECRB and ECRL.  He has lack of finger extension and thumb extension noted.   REFLEXES: 2+ throughout, Flexor plantar response SENSATION: Decreased sensation noted in the left ulnar nerve distribution.  Preservation of the superficial sensory radial distribution on the left.   I reviewed previous MRI which is now multiple years old.  He has extensor compartment lipomatous mass causing compression of his radial nerve.  The mass itself seems to be arising between the brachial radialis and supinator musculature.  This clearly correlates with his deficit.  Distal to the  mass his extensor compartment musculature appears to have edematous changes.  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 Radial nerve dysfunction is distal and isolated to the posterior interosseous nerve, and the chronicity and severity of denervation suggest incomplete recovery even with intervention. Full functional recovery is unlikely, but surgical removal may prevent further deterioration. Further imaging is required to delineate the mass and its relationship to neurovascular structures prior to considering surgery. - Ordered updated MRI of the left elbow with and without contrast to evaluate the mass and its relationship to neurovascular structures.  His location clearly correlates with the PIN distribution, however with his severe ulnar nerve deficits I  would like to make sure that it has not invaded more medially. - Planned to discuss MRI findings and collaborate with referring provider regarding possible surgical intervention. - Will follow up after MRI to determine surgical candidacy for mass removal.  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 The ulnar nerve involvement is distinct from the radial neuropathy and likely due to chronic compression or injury at the elbow. Chronicity and degree of dysfunction indicate a guarded prognosis. Further imaging is necessary to assess for structural causes and determine surgical indications. - Ordered updated MRI of the left elbow to assess for ulnar nerve pathology and rule out extension of the mass or other structural causes. - Will review MRI findings to determine if surgical intervention at the elbow is warranted.  Penne MICAEL Sharps, MD     [1]  Allergies Allergen Reactions   Spironolactone  Other (See Comments)    Breast pain/tenderness   "

## 2024-03-01 ENCOUNTER — Ambulatory Visit (HOSPITAL_COMMUNITY): Admission: RE | Admit: 2024-03-01 | Source: Ambulatory Visit

## 2024-03-03 ENCOUNTER — Encounter: Payer: Self-pay | Admitting: Neurosurgery

## 2024-03-03 ENCOUNTER — Ambulatory Visit: Admitting: Neurosurgery

## 2024-03-03 VITALS — BP 210/112 | Ht 66.0 in | Wt 301.0 lb

## 2024-03-03 DIAGNOSIS — R2232 Localized swelling, mass and lump, left upper limb: Secondary | ICD-10-CM | POA: Diagnosis not present

## 2024-03-03 DIAGNOSIS — G5622 Lesion of ulnar nerve, left upper limb: Secondary | ICD-10-CM | POA: Diagnosis not present

## 2024-03-03 DIAGNOSIS — D1722 Benign lipomatous neoplasm of skin and subcutaneous tissue of left arm: Secondary | ICD-10-CM | POA: Insufficient documentation

## 2024-03-03 DIAGNOSIS — S548X2S Unspecified injury of other nerves at forearm level, left arm, sequela: Secondary | ICD-10-CM | POA: Insufficient documentation

## 2024-03-03 NOTE — Progress Notes (Signed)
 "  [facility]  PCP: Paseda, Folashade R, FNP Primary Cardiologist: Dr. Lonni  HF Cardiologist: Dr. Cherrie   Reason for Visit: f/u for systolic heart failure   HPI: Randy Morgan is a 59 y.o. with morbid obesity, HTN, OSA, HFpEF, tobacco abuse, and DM2.   Sleep study 10/2019 AHI 80 with desaturations to 68%.   Admitted 01/30/21 with increased dyspnea in the setting of COVID. Echo EF 35-40%.  He was volume overloaded and diuresed with IV lasix .  CT negative for PE. Started on GDMT. Discharge weight 310 pounds.   R/L Cath 02/16/21 Normal cors. LVEF 45% RA = 12 RV = 44/14 PA = 40/24 (21) PCW = 21 Fick cardiac output/index = 8.0/3.3 PVR = 0.9 WU FA sat = 97% PA sat =  78%, 80% SVC sat = 79%  Echo 10/23 EF improved to 55-60%, RV mildly reduced.  Lost to follow up 10/23. Seen back 8/25, off all meds. Meds restarted.  Echo 8/25 showed EF 40-45%, moderate LVH, RV normal, small to moderate pericardial effusion (off most GDMT).  He presents today for f/u. Here w/ wife. Ran out of meds 1 wk ago. Refills available at pharmacy but he hasn't picked up yet. BP elevated at 118/120. He says that when he takes his meds, his BP has been in the 130s systolic. Has been tolerating well w/o side effects. He has BMP done earlier this wk, Scr was 1.2, K 3.9.   He denies resting dyspnea. No LEE. Reports stable NYHA Class IIb symptoms, confounded by obesity and deconditioning. He was recently stated on Mounjaro  but hasn't lost a significant amount of wt yet.   ReDs was measured and elevated at 44% but doubt accurate reading, suspect falsely elevated w/ large chest AP diameter.    Cardiac Testing  - Echo (8/25): EF 40-45%, moderate LVH, RV normal, small to moderate pericardial effusion  - Echo (10/23): EF 55-60% RV mildly reduced. Moderate LVH.   - Echo (1/23): EF 35-40% Moderate pericardial effusion. Severe LVH.   - Echo (2019): EF 55-60% Grade I DD Mild LVH. Trivial  pericardial effusion.   Past Medical History:  Diagnosis Date   Asthma    CHF (congestive heart failure) (HCC)    Hypertension    Sleep apnea    hasn't started CPAP yet   Current Outpatient Medications  Medication Sig Dispense Refill   albuterol  (VENTOLIN  HFA) 108 (90 Base) MCG/ACT inhaler Inhale 2 puffs into the lungs every 6 (six) hours as needed for wheezing or shortness of breath. (Patient not taking: Reported on 02/12/2024) 8 g 2   amLODipine  (NORVASC ) 10 MG tablet Take 1 tablet (10 mg total) by mouth daily. 90 tablet 1   Blood Glucose Monitoring Suppl DEVI 1 each by Does not apply route in the morning, at noon, and at bedtime. May substitute to any manufacturer covered by patient's insurance. (Patient not taking: Reported on 02/12/2024) 1 each 0   ENTRESTO  97-103 MG Take 1 tablet by mouth 2 (two) times daily. 180 tablet 1   eplerenone  (INSPRA ) 50 MG tablet Take 1 tablet (50 mg total) by mouth daily. 90 tablet 3   fexofenadine  (ALLEGRA  HIVES 24HR) 180 MG tablet Take 1 tablet (180 mg total) by mouth daily. (Patient not taking: Reported on 02/12/2024)     metFORMIN  (GLUCOPHAGE -XR) 500 MG 24 hr tablet Take 1 tablet (500 mg total) by mouth 2 (two) times daily with a meal. 180 tablet 1   metoprolol  succinate (TOPROL  XL)  50 MG 24 hr tablet Take 1 tablet (50 mg total) by mouth daily. 90 tablet 3   nicotine  (NICODERM CQ  - DOSED IN MG/24 HOURS) 21 mg/24hr patch Place 1 patch (21 mg total) onto the skin daily. 42 patch 0   pantoprazole  (PROTONIX ) 40 MG tablet Take 1 tablet (40 mg total) by mouth daily. (Patient not taking: Reported on 02/12/2024) 30 tablet 3   rosuvastatin  (CRESTOR ) 20 MG tablet Take 1 tablet (20 mg total) by mouth daily. 90 tablet 3   tirzepatide  (MOUNJARO ) 10 MG/0.5ML Pen Inject 10 mg into the skin once a week. 2 mL 0   torsemide  (DEMADEX ) 20 MG tablet Take 2 tablets (40 mg total) by mouth daily. 180 tablet 1   No current facility-administered medications for this visit.    Allergies  Allergen Reactions   Spironolactone  Other (See Comments)    Breast pain/tenderness   Social History   Socioeconomic History   Marital status: Married    Spouse name: Not on file   Number of children: 2   Years of education: 12th grade + 2 years college   Highest education level: Some college, no degree  Occupational History   Occupation: makes concrete blocks and bricks   Occupation: estate agent  Tobacco Use   Smoking status: Former    Current packs/day: 0.00    Average packs/day: 0.5 packs/day for 25.0 years (12.5 ttl pk-yrs)    Types: Cigarettes    Start date: 02/26/1996    Quit date: 02/25/2021    Years since quitting: 3.0   Smokeless tobacco: Never  Vaping Use   Vaping status: Never Used  Substance and Sexual Activity   Alcohol use: Not Currently    Alcohol/week: 2.0 standard drinks of alcohol    Types: 2 Cans of beer per week    Comment: occ beer   Drug use: No   Sexual activity: Not Currently  Other Topics Concern   Not on file  Social History Narrative   Lives with his wife and daughter.   Other child lives independently in [place] .   Drinks about 3 sodas a day       Pt stated he quit smoking   Social Drivers of Health   Tobacco Use: Medium Risk (02/12/2024)   Patient History    Smoking Tobacco Use: Former    Smokeless Tobacco Use: Never    Passive Exposure: Not on Actuary Strain: Not on file  Food Insecurity: Not on file  Transportation Needs: Not on file  Physical Activity: Not on file  Stress: Not on file  Social Connections: Not on file  Intimate Partner Violence: Not on file  Depression (PHQ2-9): Low Risk (11/14/2023)   Depression (PHQ2-9)    PHQ-2 Score: 0  Alcohol Screen: Not on file  Housing: Not on file  Utilities: Not on file  Health Literacy: Not on file   Family History  Problem Relation Age of Onset   Heart disease Mother    Diabetes Mother    Asthma Mother    Heart disease Father    Asthma  Sister    Heart disease Brother    Heart attack Brother    Colon cancer Neg Hx    Esophageal cancer Neg Hx    Rectal cancer Neg Hx    Stomach cancer Neg Hx    Wt Readings from Last 3 Encounters:  02/12/24 (!) 139.7 kg (308 lb)  01/12/24 (!) 136.9 kg (301 lb 12.8 oz)  12/05/23 ROLLEN)  137.1 kg (302 lb 3.2 oz)   There were no vitals taken for this visit.  Physical Exam  GENERAL: Super morbid obesity, NAD Lungs- clear CARDIAC:  thick neck, JVD not well visualized          Normal rate with regular rhythm. No MRG. No LEE ABDOMEN: obese, soft, non-tender, non-distended.  EXTREMITIES: Warm and well perfused.  NEUROLOGIC: No obvious FND    ASSESSMENT & PLAN: 1. Chronic HFiEF - Echo (1/23): 35-40% LVH mod pericardial effusion. EF previously 55% in 2019.  - Cath (1/23): Normal cors. EF 45% (suspect HTN in nature). Mildly elevated pressures with high CO - Echo (10/23): EF improved 55-60%. RV mildly reduced.   - NYHA Class IIb confound by obesity/deconditioning. Does not appear grossly volume overloaded on exam  - BP elevated in setting of being out of HF GDMT/diuretics x 1 wk  - restart regimen  - Continue torsemide  40 mg daily. - Continue Entresto  97/103 mg bid. - Continue eplerenone  25 mg daily. - Continue Toprol  XL 25 mg daily. - No SGLT2i with A1C 12, re-assess when glucose is better controlled - reviewed labs from 3 days ago, SCr/K WNL  - f/u w/ PharmD in 4 wks to reassess BP and further adjust meds if needed   2. Hypertension  - elevated in setting of poor med compliance. - he will pick up meds at pharmacy today, regimen per above  - Really needs OSA treated  3. OSA, severe - He is willing to try CPAP again  4. DM2 - most recent A1C 12. Was off meds. Now restarted  - On metformin  and tirzepatide  - Management per PCP  5. Obesity  - There is no height or weight on file to calculate BMI. - now back on GLP1  6. Tobacco Abuse - Quit x 3 months - Congratulated.  PharmD  f/u in 4 wks, APP in 3 months.   Harlene CHRISTELLA Gainer, FNP  9:49 AM  "

## 2024-03-04 ENCOUNTER — Telehealth (HOSPITAL_COMMUNITY): Payer: Self-pay

## 2024-03-04 NOTE — Telephone Encounter (Signed)
 Called to confirm/remind patient of their appointment at the [facility] on 03/05/24.   Appointment:   [x] Confirmed  [] Left mess   [] No answer/No voice mail  [] VM Full/unable to leave message  [] Phone not in service  Patient reminded to bring all medications and/or complete list.  Confirmed patient has transportation. Gave directions, instructed to utilize valet parking.

## 2024-03-05 ENCOUNTER — Telehealth (HOSPITAL_COMMUNITY): Payer: Self-pay

## 2024-03-05 ENCOUNTER — Ambulatory Visit: Admitting: Neurosurgery

## 2024-03-05 ENCOUNTER — Encounter (HOSPITAL_COMMUNITY): Payer: Self-pay

## 2024-03-05 ENCOUNTER — Encounter: Payer: Self-pay | Admitting: Neurosurgery

## 2024-03-05 ENCOUNTER — Ambulatory Visit (HOSPITAL_COMMUNITY): Payer: Self-pay | Admitting: Family Medicine

## 2024-03-05 ENCOUNTER — Ambulatory Visit (HOSPITAL_COMMUNITY): Admission: RE | Admit: 2024-03-05 | Source: Ambulatory Visit

## 2024-03-05 ENCOUNTER — Other Ambulatory Visit (HOSPITAL_COMMUNITY): Payer: Self-pay

## 2024-03-05 VITALS — BP 175/112 | HR 91 | Wt 309.0 lb

## 2024-03-05 VITALS — BP 150/99 | HR 53 | Temp 98.9°F | Ht 66.5 in | Wt 306.2 lb

## 2024-03-05 DIAGNOSIS — E119 Type 2 diabetes mellitus without complications: Secondary | ICD-10-CM

## 2024-03-05 DIAGNOSIS — G4733 Obstructive sleep apnea (adult) (pediatric): Secondary | ICD-10-CM

## 2024-03-05 DIAGNOSIS — Z720 Tobacco use: Secondary | ICD-10-CM

## 2024-03-05 DIAGNOSIS — I5022 Chronic systolic (congestive) heart failure: Secondary | ICD-10-CM

## 2024-03-05 DIAGNOSIS — I10 Essential (primary) hypertension: Secondary | ICD-10-CM

## 2024-03-05 DIAGNOSIS — D329 Benign neoplasm of meninges, unspecified: Secondary | ICD-10-CM

## 2024-03-05 LAB — BASIC METABOLIC PANEL WITH GFR
Anion gap: 10 (ref 5–15)
BUN: 10 mg/dL (ref 6–20)
CO2: 32 mmol/L (ref 22–32)
Calcium: 8.8 mg/dL — ABNORMAL LOW (ref 8.9–10.3)
Chloride: 97 mmol/L — ABNORMAL LOW (ref 98–111)
Creatinine, Ser: 1.06 mg/dL (ref 0.61–1.24)
GFR, Estimated: >60 mL/min
Glucose, Bld: 197 mg/dL — ABNORMAL HIGH (ref 70–99)
Potassium: 3.5 mmol/L (ref 3.5–5.1)
Sodium: 140 mmol/L (ref 135–145)

## 2024-03-05 LAB — PRO BRAIN NATRIURETIC PEPTIDE: Pro Brain Natriuretic Peptide: 741 pg/mL — ABNORMAL HIGH

## 2024-03-05 MED ORDER — METOPROLOL SUCCINATE ER 50 MG PO TB24: 75.0000 mg | ORAL_TABLET | Freq: Every day | ORAL | 3 refills | Status: AC

## 2024-03-05 MED ORDER — DAPAGLIFLOZIN PROPANEDIOL 10 MG PO TABS: 10.0000 mg | ORAL_TABLET | Freq: Every day | ORAL | 5 refills | Status: AC

## 2024-03-05 MED ORDER — EMPAGLIFLOZIN 10 MG PO TABS: 10.0000 mg | ORAL_TABLET | Freq: Every day | ORAL | 5 refills | Status: DC

## 2024-03-05 MED ORDER — LEVETIRACETAM 500 MG PO TABS: 500.0000 mg | ORAL_TABLET | Freq: Two times a day (BID) | ORAL | 4 refills | Status: AC

## 2024-03-05 NOTE — Telephone Encounter (Signed)
 Advanced Heart Failure Patient Advocate Encounter  Test billing for this patient's current coverage ([employer]) returns a $4 copay for 90 day supply of Farxiga . Jardiance  requires prior authorization.  This test claim was processed through [facility]- copay amounts may vary at other pharmacies due to pharmacy/plan contracts, or as the patient moves through the different stages of their insurance plan.  Rachel DEL, CPhT Rx Patient Advocate Phone: [phone]

## 2024-03-05 NOTE — Patient Instructions (Addendum)
 Good to see you today!  START Farxiga  10 mg ( 1 tablet)daily  INCREASE toprol  xl to 75 mg (1 1/2 tablets)daily  Labs done today, your results will be available in MyChart, we will contact you for abnormal readings.  You have been referred to pulmonary they will call to schedule an appointment  Your physician recommends that you schedule a follow-up appointment as scheduled  If you have any questions or concerns before your next appointment please send us  a message through [facility] or call our office at [phone].    TO LEAVE A MESSAGE FOR THE NURSE SELECT OPTION 2, PLEASE LEAVE A MESSAGE INCLUDING: YOUR NAME DATE OF BIRTH CALL BACK NUMBER REASON FOR CALL**this is important as we prioritize the call backs  YOU WILL RECEIVE A CALL BACK THE SAME DAY AS LONG AS YOU CALL BEFORE 4:00 PM At the [facility], you and your health needs are our priority. As part of our continuing mission to provide you with exceptional heart care, we have created designated Provider Care Teams. These Care Teams include your primary Cardiologist (physician) and Advanced Practice Providers (APPs- Physician Assistants and Nurse Practitioners) who all work together to provide you with the care you need, when you need it.   You may see any of the following providers on your designated Care Team at your next follow up: Dr Toribio Fuel Dr Ezra Shuck Dr. Morene Brownie Greig Mosses, NP Caffie Shed, [place] [facility], [place] Beckey Coe, NP Jordan Lee, NP Ellouise Class, NP Tinnie Redman, PharmD Jaun Bash, PharmD   Please be sure to bring in all your medications bottles to every appointment.    Thank you for choosing [facility]

## 2024-03-05 NOTE — Progress Notes (Signed)
 59 y.o. male PMHx significant for Hypertension , Congestive Heart Failure w EF 35-40%, Smoking 1 pack a day since 15 (Quit in November of 25), OSA, and history of respiratory failure.    He reports that earlier today it was SBP 200+ and today in the clinic it is SBP 150 but he ran out of his BP medication . He has an appointment with his cardiologist today to help manage this.   Today he has his wife with him to discuss surgical options for his brain mass. He reports he has continued to have daily headaches, with yesterday being significantly worse 9/10. He reports a vertigo like dizziness occurring once a month for the past 6 months. Denies feeling syncopal.  He also reports intermittent expressive aphasia for the past month.

## 2024-03-05 NOTE — Progress Notes (Signed)
 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  Patient saw my partner, Dr. 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  Patient returns with his wife today and in the interim he has had an episode in which he had speech arrest and also has had an episode of severe headaches.  I am concerned about the fact that the speech arrest episode may have been epileptogenic in nature and I told him that I would like for him to start using 500 mg of Keppra  twice daily.  We had a long conversation about surgery for his arm versus his head.  I think the arm tumor has been there for many many years and I do not suspect this to be malignant.  What is going to curtail his quality of life and/or longevity is if this tumor continues to grow and causes complications.  His wife wants him to have this tumor removed first and I agree with her.  Therefore, he decided that he wants to have this taken out.  I went over the procedure as well as the postoperative course with him and told him about the postop day 1 in the ICU and early ambulation as well as pain management.  We talked about the risk of surgery including but not limited to infection, hemorrhage, stroke, paralysis, seizures as well as general anesthetic concerns.  Today his blood pressure is very high because he has not been taking amlodipine  and he is going to be seeing his cardiologist later today.  I told him to request from his cardiologist for clearance for a craniotomy.  His previous MRI imaging did not include T1 with contrast 1 mm slices and I will request another MRI to be done but I will do it without contrast because you can see the tumor well and I am specifically stated in the comments to do a Stealth sequence and  explained why.  He wants to proceed with the surgery.  I will schedule him for a right sided craniotomy.

## 2024-04-23 ENCOUNTER — Encounter: Admitting: Neurosurgery

## 2024-05-03 ENCOUNTER — Ambulatory Visit (HOSPITAL_COMMUNITY)
